# Patient Record
Sex: Male | Born: 1960
Health system: Southern US, Community
[De-identification: ages and names within clinical notes are randomized; demographics above are authoritative.]

## PROBLEM LIST (undated history)

## (undated) DIAGNOSIS — M1990 Unspecified osteoarthritis, unspecified site: Secondary | ICD-10-CM

## (undated) DIAGNOSIS — F17200 Nicotine dependence, unspecified, uncomplicated: Secondary | ICD-10-CM

## (undated) DIAGNOSIS — K219 Gastro-esophageal reflux disease without esophagitis: Secondary | ICD-10-CM

## (undated) DIAGNOSIS — E785 Hyperlipidemia, unspecified: Secondary | ICD-10-CM

## (undated) HISTORY — DX: Hyperlipidemia, unspecified: E78.5

## (undated) HISTORY — DX: Unspecified osteoarthritis, unspecified site: M19.90

## (undated) HISTORY — PX: BACK SURGERY: SHX140

## (undated) HISTORY — PX: MULTIPLE TOOTH EXTRACTIONS: SHX2053

---

## 2001-06-13 ENCOUNTER — Other Ambulatory Visit: Admission: RE | Admit: 2001-06-13 | Discharge: 2001-06-13 | Payer: Self-pay | Admitting: General Surgery

## 2002-11-14 ENCOUNTER — Encounter: Payer: Self-pay | Admitting: Family Medicine

## 2002-11-14 ENCOUNTER — Ambulatory Visit (HOSPITAL_COMMUNITY): Admission: RE | Admit: 2002-11-14 | Discharge: 2002-11-14 | Payer: Self-pay | Admitting: Family Medicine

## 2005-02-22 ENCOUNTER — Encounter: Admission: RE | Admit: 2005-02-22 | Discharge: 2005-02-22 | Payer: Self-pay | Admitting: Occupational Medicine

## 2008-11-30 ENCOUNTER — Emergency Department (HOSPITAL_COMMUNITY): Admission: EM | Admit: 2008-11-30 | Discharge: 2008-11-30 | Payer: Self-pay | Admitting: Emergency Medicine

## 2009-03-19 ENCOUNTER — Ambulatory Visit (HOSPITAL_COMMUNITY): Admission: RE | Admit: 2009-03-19 | Discharge: 2009-03-19 | Payer: Self-pay | Admitting: Internal Medicine

## 2009-06-19 ENCOUNTER — Ambulatory Visit (HOSPITAL_COMMUNITY): Admission: RE | Admit: 2009-06-19 | Discharge: 2009-06-19 | Payer: Self-pay | Admitting: Family Medicine

## 2009-07-02 ENCOUNTER — Ambulatory Visit (HOSPITAL_COMMUNITY): Admission: RE | Admit: 2009-07-02 | Discharge: 2009-07-02 | Payer: Self-pay | Admitting: Family Medicine

## 2011-01-12 ENCOUNTER — Emergency Department (HOSPITAL_COMMUNITY)
Admission: EM | Admit: 2011-01-12 | Discharge: 2011-01-12 | Disposition: A | Discharge status: Home / Self Care | Payer: 59 | Attending: Emergency Medicine | Admitting: Emergency Medicine

## 2011-01-12 ENCOUNTER — Emergency Department (HOSPITAL_COMMUNITY): Payer: 59

## 2011-01-12 ENCOUNTER — Encounter (HOSPITAL_COMMUNITY): Payer: Self-pay | Admitting: Radiology

## 2011-01-12 DIAGNOSIS — K5289 Other specified noninfective gastroenteritis and colitis: Secondary | ICD-10-CM | POA: Insufficient documentation

## 2011-01-12 DIAGNOSIS — R109 Unspecified abdominal pain: Secondary | ICD-10-CM | POA: Insufficient documentation

## 2011-01-12 LAB — HEPATIC FUNCTION PANEL
ALT: 19 U/L (ref 0–53)
AST: 18 U/L (ref 0–37)
Albumin: 4.1 g/dL (ref 3.5–5.2)
Bilirubin, Direct: 0.2 mg/dL (ref 0.0–0.3)
Total Bilirubin: 1 mg/dL (ref 0.3–1.2)

## 2011-01-12 LAB — BASIC METABOLIC PANEL
CO2: 24 mEq/L (ref 19–32)
Calcium: 8.8 mg/dL (ref 8.4–10.5)
GFR calc Af Amer: >60 mL/min (ref >60)
Glucose, Bld: 103 mg/dL — ABNORMAL HIGH (ref 70–99)
Potassium: 3.7 mEq/L (ref 3.5–5.1)
Sodium: 134 mEq/L — ABNORMAL LOW (ref 135–145)

## 2011-01-12 LAB — URINE MICROSCOPIC-ADD ON

## 2011-01-12 LAB — DIFFERENTIAL
Basophils Absolute: 0 10*3/uL (ref 0.0–0.1)
Basophils Relative: 0 % (ref 0–1)
Lymphocytes Relative: 11 % — ABNORMAL LOW (ref 12–46)
Neutro Abs: 9 10*3/uL — ABNORMAL HIGH (ref 1.7–7.7)
Neutrophils Relative %: 79 % — ABNORMAL HIGH (ref 43–77)

## 2011-01-12 LAB — URINALYSIS, ROUTINE W REFLEX MICROSCOPIC
Glucose, UA: NEGATIVE mg/dL
Hgb urine dipstick: NEGATIVE
Leukocytes, UA: NEGATIVE
pH: 5.5 (ref 5.0–8.0)

## 2011-01-12 LAB — CBC
HCT: 52.5 % — ABNORMAL HIGH (ref 39.0–52.0)
Hemoglobin: 18.1 g/dL — ABNORMAL HIGH (ref 13.0–17.0)
MCHC: 34.5 g/dL (ref 30.0–36.0)
RBC: 5.77 MIL/uL (ref 4.22–5.81)
WBC: 11.4 10*3/uL — ABNORMAL HIGH (ref 4.0–10.5)

## 2011-01-12 MED ORDER — IOHEXOL 300 MG/ML  SOLN
100.0000 mL | Freq: Once | INTRAMUSCULAR | Status: AC | PRN
  Administered 2011-01-12: 100 mL via INTRAVENOUS

## 2011-01-13 ENCOUNTER — Emergency Department (HOSPITAL_COMMUNITY): Payer: 59

## 2011-01-13 ENCOUNTER — Inpatient Hospital Stay (HOSPITAL_COMMUNITY)
Admission: AD | Admit: 2011-01-13 | Discharge: 2011-01-16 | DRG: 387 | Disposition: A | Discharge status: Home / Self Care | Payer: 59 | Source: Ambulatory Visit | Attending: Internal Medicine | Admitting: Internal Medicine

## 2011-01-13 DIAGNOSIS — M1990 Unspecified osteoarthritis, unspecified site: Secondary | ICD-10-CM | POA: Diagnosis present

## 2011-01-13 DIAGNOSIS — R7309 Other abnormal glucose: Secondary | ICD-10-CM | POA: Diagnosis present

## 2011-01-13 DIAGNOSIS — F1010 Alcohol abuse, uncomplicated: Secondary | ICD-10-CM | POA: Diagnosis present

## 2011-01-13 DIAGNOSIS — E876 Hypokalemia: Secondary | ICD-10-CM | POA: Diagnosis present

## 2011-01-13 DIAGNOSIS — R918 Other nonspecific abnormal finding of lung field: Secondary | ICD-10-CM | POA: Diagnosis present

## 2011-01-13 DIAGNOSIS — K5000 Crohn's disease of small intestine without complications: Principal | ICD-10-CM | POA: Diagnosis present

## 2011-01-13 DIAGNOSIS — F17200 Nicotine dependence, unspecified, uncomplicated: Secondary | ICD-10-CM | POA: Diagnosis present

## 2011-01-13 LAB — URINALYSIS, ROUTINE W REFLEX MICROSCOPIC
Nitrite: NEGATIVE
Protein, ur: NEGATIVE mg/dL
Urobilinogen, UA: 0.2 mg/dL (ref 0.0–1.0)

## 2011-01-13 LAB — COMPREHENSIVE METABOLIC PANEL
BUN: 5 mg/dL — ABNORMAL LOW (ref 6–23)
CO2: 26 mEq/L (ref 19–32)
Calcium: 8.4 mg/dL (ref 8.4–10.5)
Chloride: 102 mEq/L (ref 96–112)
Creatinine, Ser: 0.91 mg/dL (ref 0.4–1.5)
GFR calc non Af Amer: >60 mL/min (ref >60)
Total Bilirubin: 0.8 mg/dL (ref 0.3–1.2)

## 2011-01-13 LAB — LIPASE, BLOOD: Lipase: 21 U/L (ref 11–59)

## 2011-01-13 LAB — DIFFERENTIAL
Eosinophils Absolute: 0.2 10*3/uL (ref 0.0–0.7)
Lymphs Abs: 1.1 10*3/uL (ref 0.7–4.0)
Monocytes Absolute: 0.5 10*3/uL (ref 0.1–1.0)
Monocytes Relative: 6 % (ref 3–12)
Neutro Abs: 6.6 10*3/uL (ref 1.7–7.7)
Neutrophils Relative %: 79 % — ABNORMAL HIGH (ref 43–77)

## 2011-01-13 LAB — CBC
Hemoglobin: 16 g/dL (ref 13.0–17.0)
MCH: 31 pg (ref 26.0–34.0)
MCHC: 33.9 g/dL (ref 30.0–36.0)
MCV: 91.5 fL (ref 78.0–100.0)
RBC: 5.16 MIL/uL (ref 4.22–5.81)

## 2011-01-14 LAB — CLOSTRIDIUM DIFFICILE BY PCR: Toxigenic C. Difficile by PCR: NEGATIVE

## 2011-01-15 ENCOUNTER — Inpatient Hospital Stay (HOSPITAL_COMMUNITY): Payer: 59

## 2011-01-15 DIAGNOSIS — K5289 Other specified noninfective gastroenteritis and colitis: Secondary | ICD-10-CM

## 2011-01-15 LAB — DIFFERENTIAL
Basophils Relative: 1 % (ref 0–1)
Eosinophils Absolute: 0.3 10*3/uL (ref 0.0–0.7)
Eosinophils Relative: 5 % (ref 0–5)
Lymphs Abs: 0.9 10*3/uL (ref 0.7–4.0)

## 2011-01-15 LAB — CBC
MCV: 93 fL (ref 78.0–100.0)
Platelets: 152 10*3/uL (ref 150–400)
RDW: 13.1 % (ref 11.5–15.5)
WBC: 5.4 10*3/uL (ref 4.0–10.5)

## 2011-01-15 LAB — BASIC METABOLIC PANEL
BUN: 2 mg/dL — ABNORMAL LOW (ref 6–23)
Chloride: 109 mEq/L (ref 96–112)
GFR calc non Af Amer: >60 mL/min (ref >60)
Glucose, Bld: 140 mg/dL — ABNORMAL HIGH (ref 70–99)
Potassium: 4.6 mEq/L (ref 3.5–5.1)

## 2011-01-15 LAB — OVA AND PARASITE EXAMINATION

## 2011-01-15 NOTE — H&P (Signed)
NAME:  Matthew Rivera, Matthew Rivera NO.:  [id number]  MEDICAL RECORD NO.:  [id number]           PATIENT TYPE:  E  LOCATION:  [facility]ED                          FACILITY:  [facility]  PHYSICIAN:  Houston Siren, MD           DATE OF BIRTH:  1961-04-23  DATE OF ADMISSION:  01/13/2011 DATE OF DISCHARGE:  LH                             HISTORY & PHYSICAL   PRIMARY CARE PHYSICIAN:  Elsie Stain, MD  ADVANCE DIRECTIVES:  Full code.  REASON FOR ADMISSION:  Abdominal pain suspicious for Crohn disease.  HISTORY OF PRESENT ILLNESS:  This is a 50 year old male with history of alcohol and nicotine abuse, DJD, presented to the emergency room with 4- day history of abdominal cramps, pain, with 1 day history of diarrhea.  He denied any fever or chills.  He took some Pepto-Bismol and had some dark stool. He was evaluated in the emergency room yesterday when abdominal CT showed small bowel wall thickening suspicious for Crohn's disease with small bowel ileus.  He was given Cipro and Flagyl, but his pain increased, and he has not been able to take any p.o.  Reevaluation today showed a chest x-ray with question of small bowel obstruction versus ileus.  He has a normal lipase of 22, and normal liver function tests. His white count is 11,400, hemoglobin of 18.1.  His creatinine was normal at 1.03, and his urinalysis is negative.  Hospitalist was asked to admit the patient for abdominal pain.  PAST MEDICAL HISTORY:  As above.  PAST SURGICAL HISTORY:  Cyst removed from his spine.  SOCIAL HISTORY:  No drug abuse.  He does smoke and drinks about 3-4 drinks most days.  ALLERGIES:  No known drug allergies.  MEDICATIONS:  Cipro and Flagyl along with Levsin.  No family history of inflammatory bowel disease.  PHYSICAL EXAMINATION:  VITAL SIGNS:  Blood pressure 120/80, pulse of 90, respiratory rate of 20, temperature 97.8. GENERAL:  He is alert and oriented, and is in no apparent distress. HEENT:  Sclerae  are nonicteric.  Throat is clear. NECK:  Supple. CARDIAC:  S1 and S2 regular. LUNGS:  Clear. ABDOMEN:  Soft, nondistended, slightly tender in the mid-epigastrium. No palpable mass.  Bowel sounds present.  No ascites. EXTREMITIES:  Show no edema. SKIN:  Warm and dry.  No peripheral stigmata of liver disease. NEUROLOGICAL AND PSYCHIATRIC:  Unremarkable as well.  He has no tremor. No asterixis.  OBJECTIVE FINDINGS:  Urinalysis is negative.  Lipase 21.  Serum sodium 133, potassium 3.3, glucose 138, creatinine 0.91.  Liver function tests are all normal.  White count of 8400, hemoglobin of 16.0.  IMPRESSION:  This is a 50 year old with abdominal pain, diarrhea with CT scan showing inflammation of his small bowel.  I suspect that he has Crohn's disease.  Other differential includes infectious colitis, viral gastroenteritis, and C diff colitis.  We will admit.  I will get stool studies to include Giardia antigen, ova and parasites, culture and sensitivity, and C diff toxin.  He will require intravenous fluid and pain medication.  He will  try to quit smoking  without a nicotine patch.  We will give him alcohol withdrawal protocol with Ativan.  He will likely need a colonoscopy and an esophagogastroduodenoscopy.  Please consult Gastroenterology in the morning.  He is full code, will be admitted to [facility]  Team 1.  He is stable.  I will continue IV Cipro and Flagyl.     Houston Siren, MD     PL/MEDQ  D:  01/13/2011  T:  01/13/2011  Job:  [id number]  Electronically Signed by Houston Siren  on 01/15/2011 11:28:30 PM

## 2011-01-16 DIAGNOSIS — K5289 Other specified noninfective gastroenteritis and colitis: Secondary | ICD-10-CM

## 2011-01-16 LAB — BASIC METABOLIC PANEL
CO2: 28 mEq/L (ref 19–32)
Calcium: 8.2 mg/dL — ABNORMAL LOW (ref 8.4–10.5)
Creatinine, Ser: 0.84 mg/dL (ref 0.4–1.5)
GFR calc Af Amer: >60 mL/min (ref >60)
GFR calc non Af Amer: >60 mL/min (ref >60)
Glucose, Bld: 102 mg/dL — ABNORMAL HIGH (ref 70–99)
Sodium: 135 mEq/L (ref 135–145)

## 2011-01-16 LAB — CBC
Hemoglobin: 13.8 g/dL (ref 13.0–17.0)
MCH: 30.4 pg (ref 26.0–34.0)
MCV: 93 fL (ref 78.0–100.0)
RBC: 4.54 MIL/uL (ref 4.22–5.81)

## 2011-01-16 LAB — HEMOGLOBIN A1C: Mean Plasma Glucose: 100 mg/dL (ref <117)

## 2011-01-16 LAB — DIFFERENTIAL
Lymphs Abs: 1.2 10*3/uL (ref 0.7–4.0)
Monocytes Relative: 8 % (ref 3–12)
Neutro Abs: 2.5 10*3/uL (ref 1.7–7.7)
Neutrophils Relative %: 59 % (ref 43–77)

## 2011-01-18 LAB — STOOL CULTURE

## 2011-01-18 NOTE — Discharge Summary (Signed)
NAME:  Matthew Rivera, Matthew Rivera                  ACCOUNT NO.:  [id number]  MEDICAL RECORD NO.:  [id number]           PATIENT TYPE:  I  LOCATION:  A337                          FACILITY:  [facility]  PHYSICIAN:  Matthew Rivera, MDDATE OF BIRTH:  11-23-1960  DATE OF ADMISSION:  01/13/2011 DATE OF DISCHARGE:  04/07/2012LH                              DISCHARGE SUMMARY   PRIMARY CARE PHYSICIAN:  Matthew Rear. Sherwood Gambler, MD  DISCHARGE DIAGNOSES: 1. Abdominal pain secondary to small bowel enteritis. 2. Tobacco abuse. 3. Alcohol abuse 4. Hypokalemia. 5. Hyperglycemia without diagnosis of diabetes. 6. Degenerative joint disease. 7. Right lung opacity nonspecific without shortness of breath, chest     pain, or signs of pneumonia at this moment.  No other procedures     were performed during this hospitalization.  DISCHARGE MEDICATIONS: 1. Multivitamins 1 capsule by mouth daily. 2. Nicotine patch 21 mg every 24 hours transdermally, the patient     advised to stop smoking while wearing the patch. 3. Metronidazole 500 mg 1 tablet by mouth three times a day for 3     days. 4. Ciprofloxacin 500 mg 1 tablet by mouth twice a day for 3 days. 5. Levsin 0.125 mg 1-2 tablets by mouth every 4 hours as needed for     stomach pain.  DISPOSITION AND FOLLOWUP:  The patient has been discharged in a stable and improved condition, not complaining of any nausea, vomiting, or abdominal pain.  The patient also denies any diarrhea and is tolerating his food by mouth, full diet without any problems.  The patient is going to follow with primary care physician, Matthew Rivera, on Tuesday, January 19, 2011, at 9 a.m.  At that moment, it is going to be important to follow the patient's symptoms in order to verify complete resolution.  It is going to be important to arrange in about 3-6 months a colonoscopy as an outpatient clinic in order to screen him for colon cancer and also to make sure that the patient's enteritis that he had  during this hospitalization is not secondary to any inflammatory disease.  The patient will also need to have a hemoglobin A1c as an outpatient in order to continue following his episodes of hyperglycemia throughout this hospitalization without any history of diabetes.  X-ray of the chest in 6-8 weeks in order to follow nonspecific opacity of the right lung to be arranged by primary care physician.  PROCEDURE PERFORMED:  During this hospitalization, the patient had CT of the abdomen and pelvis that demonstrated moderate wall thickening involving distal small bowel loops included the terminal ileum suspicious for Crohn disease with a differential diagnosis also including infectious enteritis, vasculitis, hemorrhage, ischemia, and lymphoma.  Small bowel ileus, small amount of ascites without evidence of abscess.  The patient had an abdominal x-ray on January 13, 2011, that demonstrated mild gaseous distention of the midabdominal small bowel, contrast material in the colon suggest no obstruction.  The patient had acute abdominal series on January 15, 2011, that demonstrated interval appearance of vague nonspecific opacities at the right lung base with a small associated pleural effusion  suspicious for infection.  A followup of the chest x-ray is recommended in 6-8 weeks to ensure radiographic resolution.  Nonspecific nonobstructive bowel gas pattern was also visualized.  CONSULTATIONS:  GI was consulted during this hospitalization.  BRIEF HISTORY OF PRESENT ILLNESS:  Matthew Rivera is a 50 year old male with a history of alcohol and nicotine abuse, degenerative joint disease, who presented to the emergency room with a 4-day history of abdominal cramps and pain with 1-day history of diarrhea.  The patient denied any fever or chills.  The patient reports that he took Pepto-Bismol and had some dark stools.  The patient was evaluated in the emergency department the day prior to admission, had an  abdominal CT that showed small bowel wall thickening, suspicious for Crohn's with a small bowel ileus.  He was given Cipro and Flagyl to take by mouth and to follow with primary care physician, but the patient's pain increased and he has not been able to keep things down.  Reevaluation today show increase in the patient's small bowel inflammation with also an x-ray with a questionable small bowel instruction versus ileus.  PHYSICAL EXAMINATION:  VITAL SIGNS:  Blood pressure was 120/80, heart rate 90, respiratory rate 20, temperature 97.8. GENERAL:  The patient was alert, awake, and oriented in no distress. HEENT:  Nonicteric.  No ectasia.  Throat was clear.  No erythema.  No exudate.  Head atraumatic, normocephalic. NECK:  Supple.  No thyromegaly.  No lymphadenopathy. HEART:  Regular rate and rhythm. LUNGS:  Clear to auscultation. ABDOMEN:  Soft, nondistended, slightly tender in the midepigastric area but no palpable masses.  Bowel sounds are present. NEUROLOGIC:  Unremarkable. PSYCHIATRIC:  Unremarkable.  Pertinent laboratory data during this hospitalization revealed a CBC on admission of 8.4 with a hemoglobin of 16 and platelet of 175,000.  The patient had a lipase of 21.  C. diff PCR was negative.  Stool studies for ova and parasites were negative.  Giardia was negative.  HOSPITAL COURSE BY PROBLEM: 1. Abdominal pain secondary to small bowel enteritis.  After     discussing the case with Dr. Karilyn Cota, gastroenterologist, the plan     was to start the patient on IV antibiotics and to use Levsin p.r.n.     and to try to call off this infection.  The paren that Dr. Karilyn Cota     saw on the CT make Crohn's less likely into the differential, and     nonetheless he preferred to treat initially the infection and     follow with colonoscopy as an outpatient in 3-6 months.   2. Tobacco abuse.  The patient received counseling and also a     prescription for nicotine patch. 3. Alcohol abuse.   We are going to continue multivitamins to provide     thiamine and also folic acid.  The patient received counseling and     is planning to quit or at least cut down on his alcohol     consumption. 4. Hypokalemia secondary to poor intake and voiding contraction.     After fluid resuscitation and repletion, the patient's potassium     returned to normal range. 5. Hyperglycemia without diagnosis of diabetes.  Plan is for the     patient to follow with PCP in order to have A1c and continue     checking on his fasting blood sugar to rule out that he has no     diabetes. 6. X-ray finding of right lung opacity without any specific  details or     symptoms for pneumonia.  Plan is for the patient to have an x-ray     in 6-8 weeks in order to follow on these findings and confirm     complete three solution.  At discharge, the patient's temperature was 97.5, heart rate 61, respiratory rate 20, blood pressure 138/87, oxygen saturation 99%.  The patient was in no acute distress, alert, awake, and oriented x3, tolerating p.o.'s, not having any nausea, vomiting, or abdominal pain. He was feeling great.  Respiratory system was clear to auscultation bilaterally.  His abdomen is soft, nontender, nondistended with positive bowel sounds.  Heart regular rate and rhythm.  No edema on his extremities.  Neurological exam was nonfocal.  He had lab work demonstrating a potassium of 4.1, sodium 135, chloride 105, bicarb 28, BUN 3, creatinine 0.84, blood sugar 102.  White blood cells 4.3, hemoglobin 13.8, and his platelets were 153.     Matthew Randy, MD     CEM/MEDQ  D:  01/16/2011  T:  01/16/2011  Job:  [id number]  cc:   Matthew Rear. Sherwood Gambler, MD Fax: [phone]  Electronically Signed by Vassie Loll MD on 01/18/2011 04:04:11 PM

## 2011-02-05 NOTE — Consult Note (Signed)
NAMEGIANNIS, CORPUZ                  ACCOUNT NO.:  [id number]  MEDICAL RECORD NO.:  [id number]           PATIENT TYPE:  I  LOCATION:  A337                          FACILITY:  [facility]  PHYSICIAN:  Lionel December, M.D.    DATE OF BIRTH:  09-10-1961  DATE OF CONSULTATION: DATE OF DISCHARGE:                                CONSULTATION   REASON FOR CONSULTATION:  Acute ileitis.  HISTORY OF PRESENT ILLNESS:  Matthew Rivera is 50 year old Caucasian male patient of [name], who was in usual state of health until the evening of January 11, 2011, and he developed severe mid and lower abdominal cramping. He states when he woke up in the morning, he had no symptoms and ate his meals like before.  He was up all night with severe cramps and nonbloody diarrhea.  He did not experience any nausea, vomiting, fever, chills, breathing difficulty, or skin rash.  He says nobody else in his workplace or at home had experienced similar symptoms.  On January 12, 2011, he was still having severe abdominal cramps and now started to vomit and says it was mostly dry heaves because he had not eaten anything.  His pain continued.  He decided to come to emergency room on the evening of January 12, 2011.  He had acute abdominal series which revealed slightly distended the small bowel in midabdomen and his white cell count was elevated at 11.4 and his hemoglobin was 18.1.  He was advised to be hospitalized, but he decided to go home.  His symptoms persisted.  He therefore returned to emergency room on the evening of January 13, 2011.  He had abdominopelvic CT with contrast which showed mildly dilated loops of proximal small bowel with moderate wall thickening involving multiple loops of distal ileum and scant amount of free fluid in the abdomen and pelvis, but no pneumoperitoneum.  He also had a tiny low attenuation lesion in the inferior segment of right hepatic lobe, felt to be a cyst.  At this time, he agreed to be hospitalized.  He  was begun on IV fluids, analgesia, and begun on Cipro and Flagyl.  His stool studies are pending, except C. diff toxin titer is negative.  He is still having diarrhea.  He has noted some black specks, but no frank melena or rectal bleeding.  He continues to have cramps across his mid and lower abdomen.  He says these come in waves.  He has been sipping on liquids which he has been able to keep down.  Matthew Rivera did not travel outside the country recently.  He does not recall that he is taken any antibiotics.  At home, he is on Naprosyn, but he uses it on p.r.n. basis and had not taken it at least 2 months.  He recalls that he had abdominal pain in July or August last year when he was seen by [name].  Appointment was made for him to be seen by me.  His symptoms went away and he decided not to pursue it any further.  He has not lost any weight recently.  When he is  feeling well, his bowels move every other day.  His weight has been stable.  HOME MEDICATIONS: 1. Naprosyn 500 mg p.o. b.i.d. p.r.n. which he does not take very     often. 2. MVI daily.  CURRENT MEDICATIONS: 1. He is on D5 normal saline at 100 mL an hour. 2. Folic acid 1 mg p.o. daily. 3. MVI one p.o. daily. 4. Nicotine patch 21 mg to skin daily. 5. Thiamine 100 mg p.o. daily. 6. Dilaudid 0.5-1 mg IV q.4 p.r.n. 7. Lorazepam 1 mg IV q.6 p.r.n. 8. Zofran 4 mg IV q.6 p.r.n. 9. Phenergan 12.5 mg IV or p.o. q.6 p.r.n. 10.Zolpidem 10 mg p.o. bedtime p.r.n.  PAST MEDICAL HISTORY:  He has chronic low back pain secondary to three bulging disks.  He had pilonidal cyst excision 10 or 15 years ago by Dr. Malvin Johns.  ALLERGIES:  NK.  FAMILY HISTORY:  Father has had CABG, but doing very well and so is his mother, they are both in their early 50s.  He has a brother in good health.  SOCIAL HISTORY:  He is married.  He has one biologic son and three stepchildren.  He is working at [employer] for the last 3-1/2 years there.   He drives a forklift.  Prior to that, he worked with [employer] for 2 years in a [employer] for 16 years.  He smokes half to one pack of cigarettes per day which he has done for 20 years and he drinks beer anywhere from 0-4 cans per day.  He states he does not drink more than 3- 4 days each week.  He states over 25 years ago, he was drinking too much alcohol, but he is able to quit, but slowly went back to drinking.  OBJECTIVE:  VITAL SIGNS:  Weight is 70.8 kg, he is 71 inches tall, pulse 70 per minute, blood pressure 120/82, respiratory rate 19, and temp is 97.9. HEENT:  Conjunctivae is pink.  Sclerae nonicteric.  Oropharyngeal mucosa is normal.  Several teeth are missing, remaining in satisfactory condition.  No neck masses or thyromegaly noted. CARDIAC:  Regular rhythm.  Normal S1 and S2.  No murmur or gallop noted. LUNGS:  Clear to auscultation. ABDOMEN:  Full.  Bowel sounds are hyperactive.  On palpation, soft abdomen with generalized tenderness, but more so in epigastric region with some guarding, but no rebound noted.  No organomegaly or masses. RECTAL:  Deferred. EXTREMITIES:  No peripheral edema or clubbing noted.  LABORATORY DATA:  From this admission WBC 8.4, H and H 16 and 47.2, platelet count 175,000, segs 79%, serum sodium 133, potassium 3.3, chloride 102, CO2 of 26, glucose 138, BUN 5, creatinine 0.9, bilirubin 0.8, AP 39, SGOT 17, SGPT 16, total protein 6.7, with albumin of 3.6, calcium 8.4, serum lipase 21.  Urinalysis within normal limits.  Cultures and O and P is pending.  Abdominopelvic CT reviewed.  There are mucosal changes to several loops of this ileum involving the terminal ileum.  No abnormality noted to the colon.  There is 1-cm angio myelolipoma involving lower pole of right kidney.  Scant amount of ascites.  I have reviewed the CT with the radiologist and SMA and celiac trunk appear to be patent.  ASSESSMENT:  Matthew Rivera is 50 year old Caucasian male who presents  with acute illness consisting of severe abdominal cramps, nausea, vomiting, and nonbloody diarrhea.  Abdominopelvic CT showed changes of ileitis involving the distal several loops, mid and proximal small bowel appears to be normal,  although somewhat dilated.  No air fluid level is noted.  I suspect he has either an acute infection or toxic antritis.  He did have a brief illness about 8 months ago which raises a question whether this is Crohn disease presenting with a flare-up, but however, he has had no symptoms between these episodes.  Therefore, I am not convinced that this is Crohn disease.  I do not see any skipped lesions.  Another concern would be ischemic injury to his ileum, but his SMA and celiac trunk are patent.  He could still have disease to his small blood vessels.  He appears to be gradually improving with therapy.  RECOMMENDATIONS: 1. We will start him on Cipro 400 mg IV q12h and metronidazole 500 mg IV q6. 2. We will request CBC with diff and BMET and acute abdominal series     today. 3. If his symptoms linger on, we would consider a colonoscopy with     ileoscopy.  Further recommendations to follow.  We appreciate the opportunity to participate in the care of this gentleman.   Dictation ended at this point.Lionel December, M.D.     NR/MEDQ  D:  01/15/2011  T:  01/16/2011  Job:  [id number]  Electronically Signed by Lionel December M.D. on 02/05/2011 11:28:11 AM

## 2012-03-05 IMAGING — CR DG ABDOMEN 2V
2 series · 2 of 2 positions shown · non-contrast
Comparison: Plain films and CT 01/12/2011

CLINICAL DATA: Abdominal pain, nausea vomiting and diarrhea for 4
days.

ABDOMEN - 2 VIEW

[view not recorded (1 of 2)]
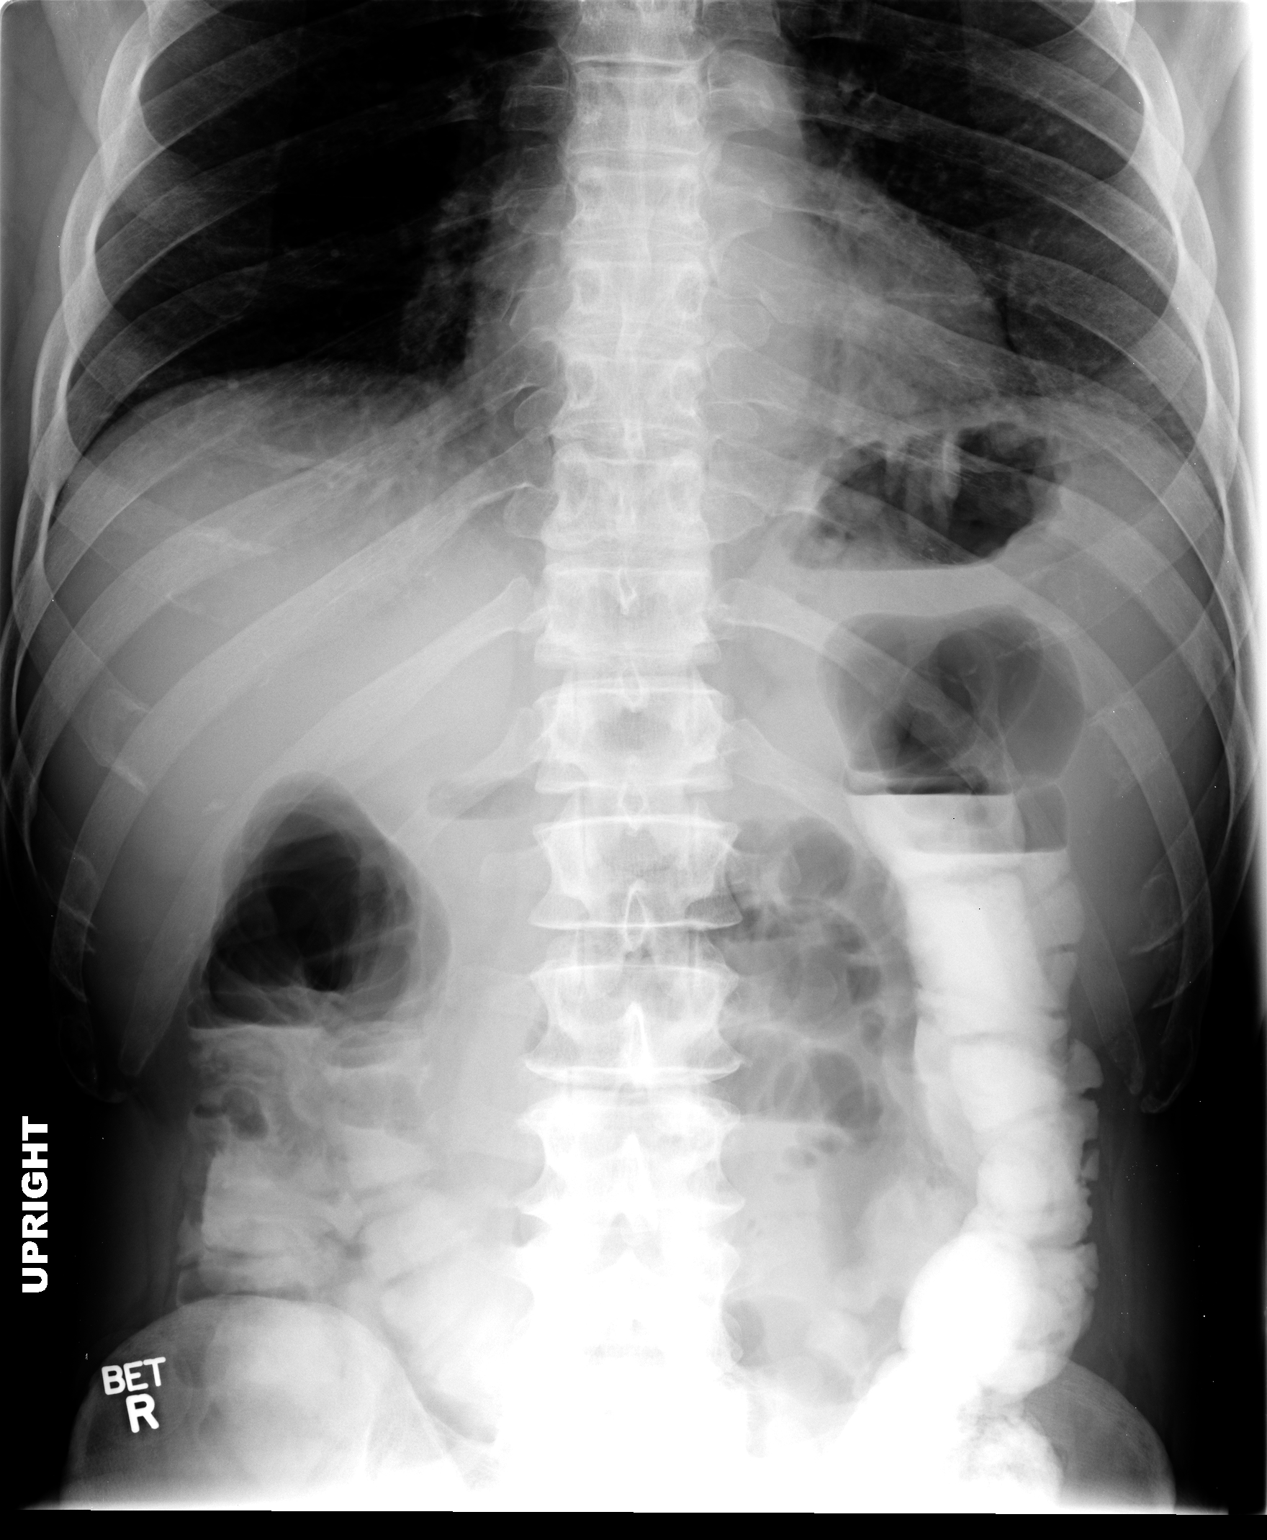

[view not recorded (2 of 2)]
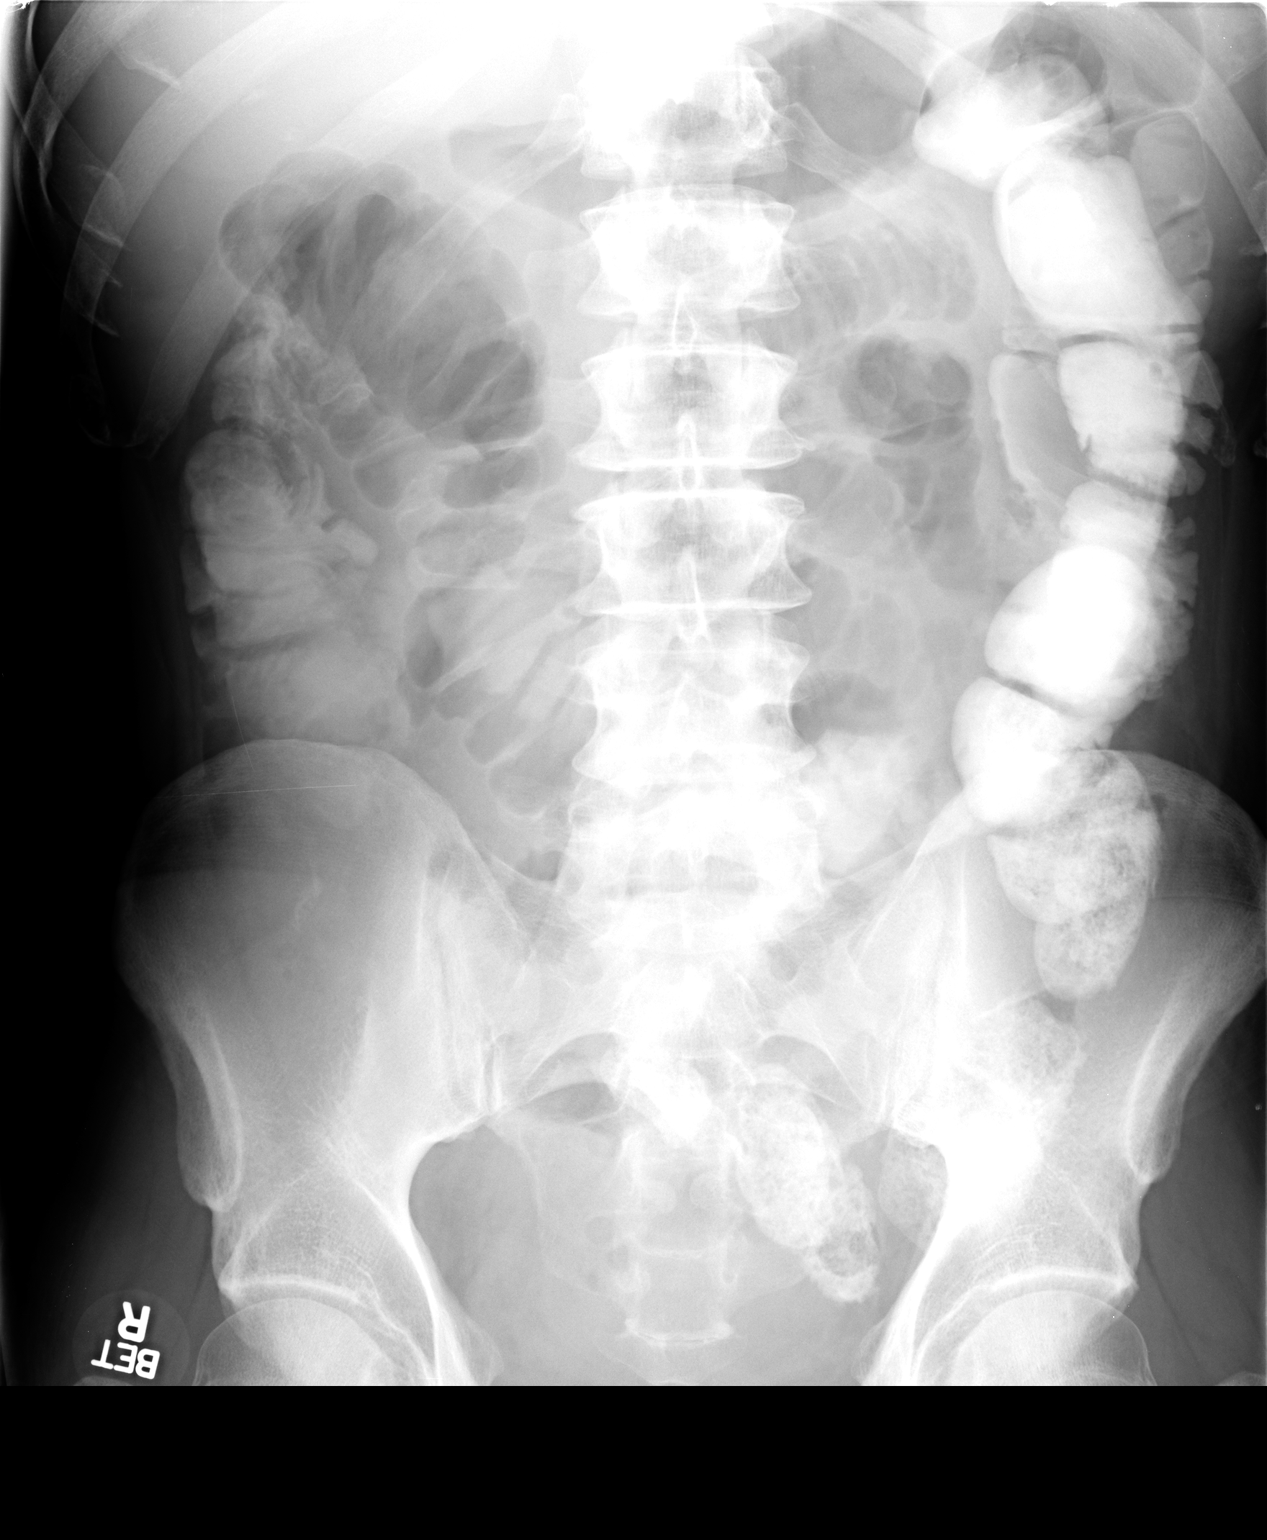

[2 of 2 positions shown; findings below may reference images not displayed]

FINDINGS: Contrast material demonstrated in nondistended colon
likely from the previous CT scan.  Gas filled mildly distended
small bowel loops in the upper abdomen.  No free air.  Changes are
similar to those seen on the prior plain film.  The extensive small
bowel wall thickening noted on the CT is not demonstrated on plain
film.
IMPRESSION: Mild gaseous distension of the mid abdominal small bowel.  Contrast
material in the colon suggests no obstruction.

## 2012-03-07 IMAGING — CR DG ABDOMEN ACUTE W/ 1V CHEST
3 series · 3 of 3 positions shown · non-contrast
Comparison: 01/13/2011

CLINICAL DATA: Abdominal pain

ACUTE ABDOMEN SERIES (ABDOMEN 2 VIEW & CHEST 1 VIEW)

[view not recorded (1 of 3)]
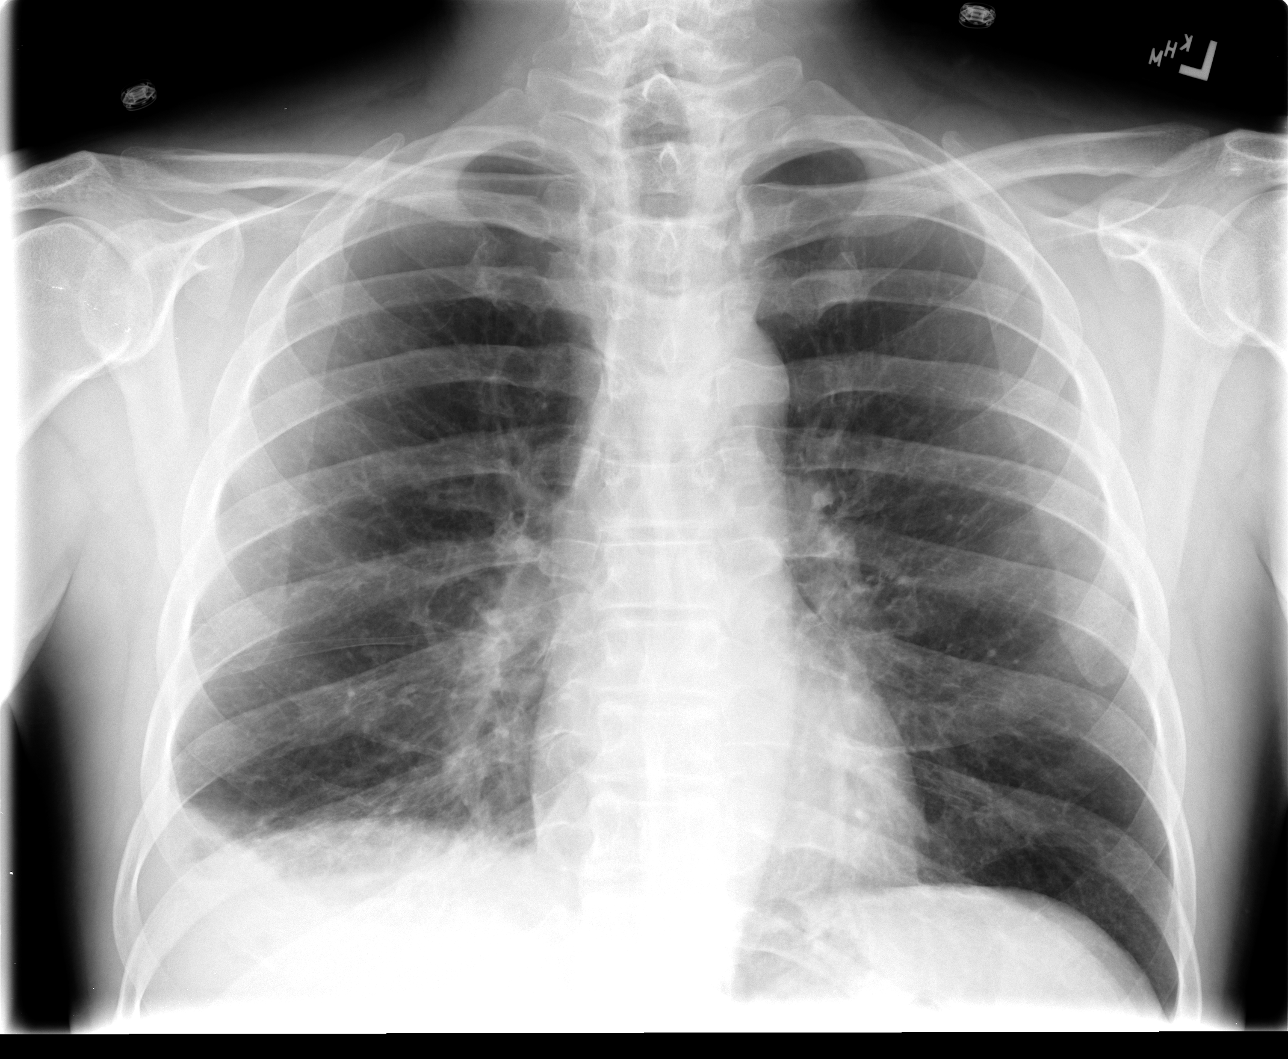

[view not recorded (2 of 3)]
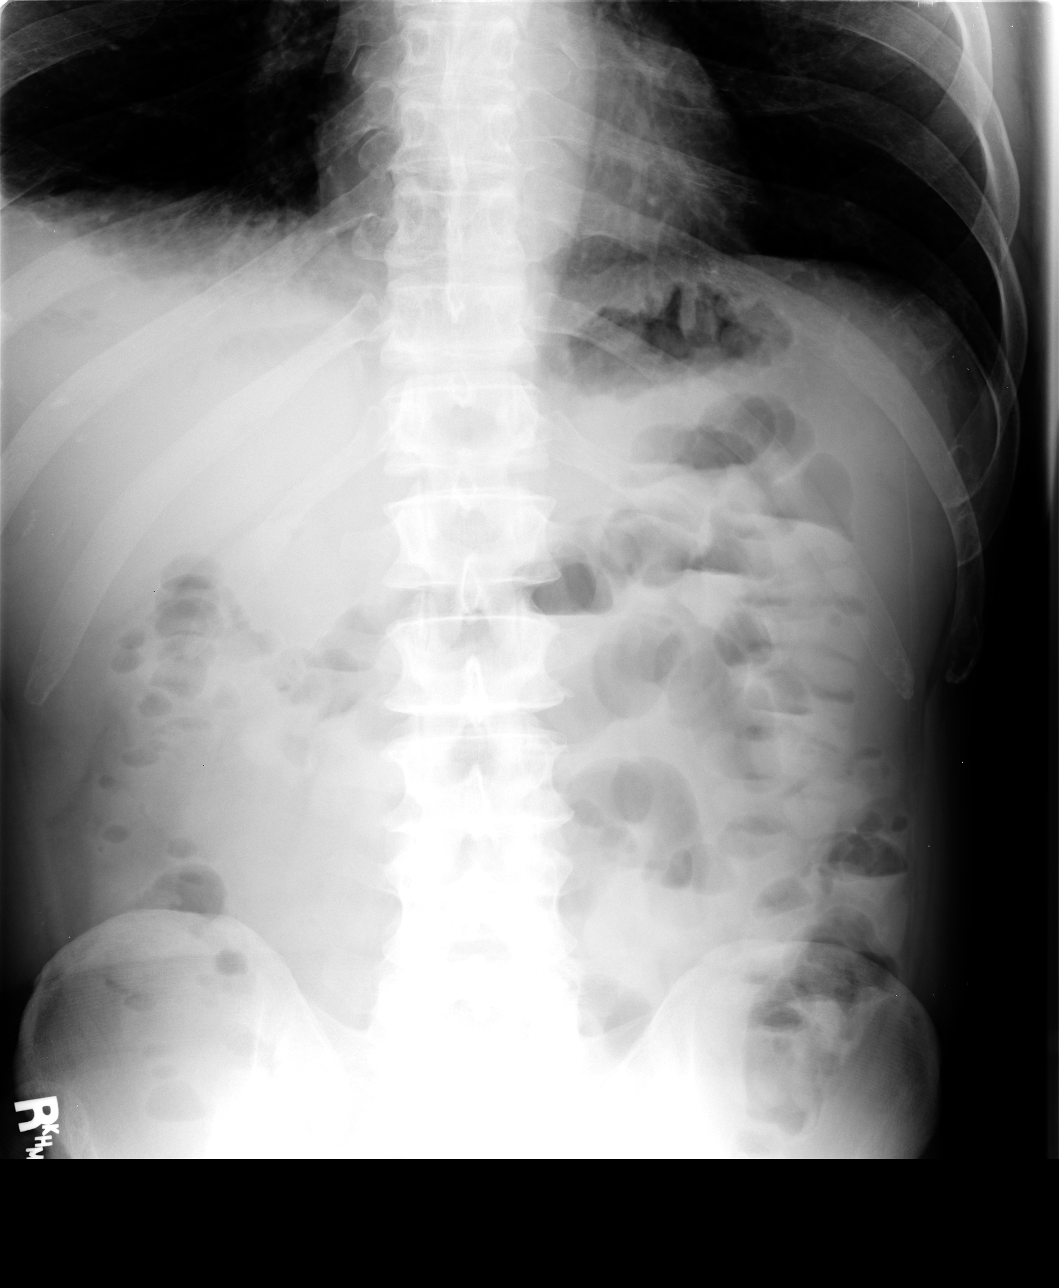

[view not recorded (3 of 3)]
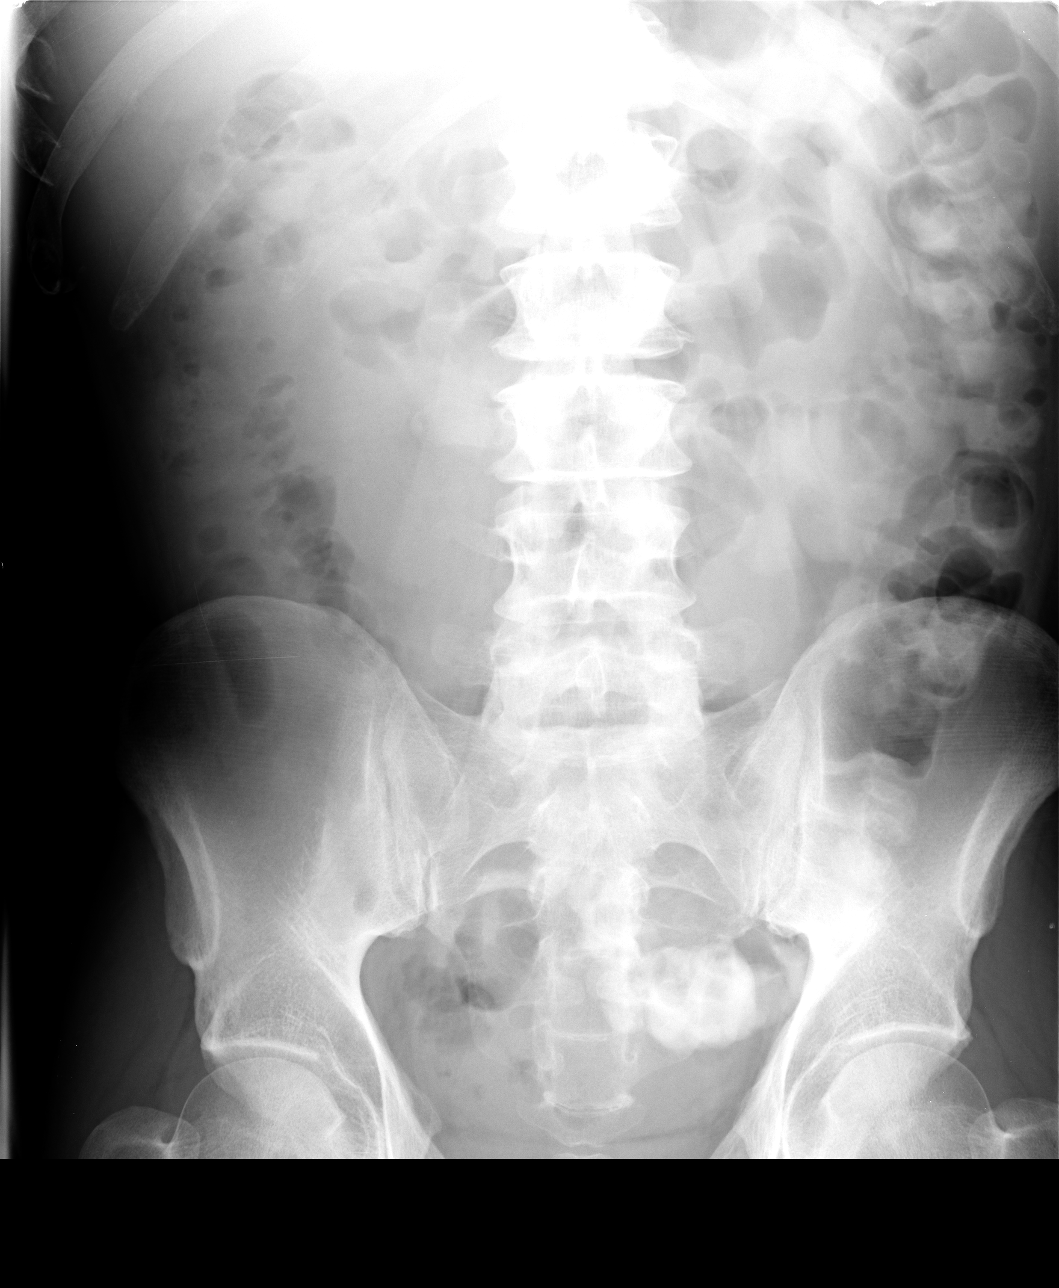

[3 of 3 positions shown; findings below may reference images not displayed]

FINDINGS: Interval appearance of vague opacity at the right lung
base and a small associated pleural effusion.  The left lung is
clear.  The heart is normal in size.  Gas filled nondilated small
bowel loops are seen in the abdomen.  Interval passage of contrast
material into the left colon.  There is no intra-abdominal free
air.  No unexpected calcifications are seen.  The bones are normal.
IMPRESSION: Interval appearance of vague nonspecific opacity at the right lung
base with small associated pleural effusion suspicious for
infection.  A follow up chest xray is recommended in 6-8 weeks to
ensure radiographic resolution.
Nonspecific, nonobstructive bowel gas pattern.

## 2016-12-16 DIAGNOSIS — G894 Chronic pain syndrome: Secondary | ICD-10-CM | POA: Diagnosis not present

## 2016-12-16 DIAGNOSIS — Z6820 Body mass index (BMI) 20.0-20.9, adult: Secondary | ICD-10-CM | POA: Diagnosis not present

## 2016-12-16 DIAGNOSIS — M1991 Primary osteoarthritis, unspecified site: Secondary | ICD-10-CM | POA: Diagnosis not present

## 2017-03-10 DIAGNOSIS — M1991 Primary osteoarthritis, unspecified site: Secondary | ICD-10-CM | POA: Diagnosis not present

## 2017-03-10 DIAGNOSIS — M2550 Pain in unspecified joint: Secondary | ICD-10-CM | POA: Diagnosis not present

## 2017-03-10 DIAGNOSIS — G894 Chronic pain syndrome: Secondary | ICD-10-CM | POA: Diagnosis not present

## 2017-05-09 DIAGNOSIS — K219 Gastro-esophageal reflux disease without esophagitis: Secondary | ICD-10-CM | POA: Diagnosis not present

## 2017-05-09 DIAGNOSIS — G894 Chronic pain syndrome: Secondary | ICD-10-CM | POA: Diagnosis not present

## 2017-05-09 DIAGNOSIS — M2550 Pain in unspecified joint: Secondary | ICD-10-CM | POA: Diagnosis not present

## 2017-06-07 DIAGNOSIS — G894 Chronic pain syndrome: Secondary | ICD-10-CM | POA: Diagnosis not present

## 2017-06-07 DIAGNOSIS — Z1389 Encounter for screening for other disorder: Secondary | ICD-10-CM | POA: Diagnosis not present

## 2017-06-07 DIAGNOSIS — Z0000 Encounter for general adult medical examination without abnormal findings: Secondary | ICD-10-CM | POA: Diagnosis not present

## 2017-11-03 DIAGNOSIS — G894 Chronic pain syndrome: Secondary | ICD-10-CM | POA: Diagnosis not present

## 2017-11-03 DIAGNOSIS — K219 Gastro-esophageal reflux disease without esophagitis: Secondary | ICD-10-CM | POA: Diagnosis not present

## 2017-11-03 DIAGNOSIS — Z719 Counseling, unspecified: Secondary | ICD-10-CM | POA: Diagnosis not present

## 2018-03-01 DIAGNOSIS — K219 Gastro-esophageal reflux disease without esophagitis: Secondary | ICD-10-CM | POA: Diagnosis not present

## 2018-03-01 DIAGNOSIS — G894 Chronic pain syndrome: Secondary | ICD-10-CM | POA: Diagnosis not present

## 2018-03-01 DIAGNOSIS — M1991 Primary osteoarthritis, unspecified site: Secondary | ICD-10-CM | POA: Diagnosis not present

## 2018-06-01 DIAGNOSIS — M1991 Primary osteoarthritis, unspecified site: Secondary | ICD-10-CM | POA: Diagnosis not present

## 2018-06-01 DIAGNOSIS — G894 Chronic pain syndrome: Secondary | ICD-10-CM | POA: Diagnosis not present

## 2018-06-01 DIAGNOSIS — K219 Gastro-esophageal reflux disease without esophagitis: Secondary | ICD-10-CM | POA: Diagnosis not present

## 2018-08-18 DIAGNOSIS — Z0000 Encounter for general adult medical examination without abnormal findings: Secondary | ICD-10-CM | POA: Diagnosis not present

## 2018-08-18 DIAGNOSIS — Z23 Encounter for immunization: Secondary | ICD-10-CM | POA: Diagnosis not present

## 2018-08-18 DIAGNOSIS — Z1389 Encounter for screening for other disorder: Secondary | ICD-10-CM | POA: Diagnosis not present

## 2018-08-18 DIAGNOSIS — Z6821 Body mass index (BMI) 21.0-21.9, adult: Secondary | ICD-10-CM | POA: Diagnosis not present

## 2018-11-08 DIAGNOSIS — G894 Chronic pain syndrome: Secondary | ICD-10-CM | POA: Diagnosis not present

## 2018-11-08 DIAGNOSIS — M1991 Primary osteoarthritis, unspecified site: Secondary | ICD-10-CM | POA: Diagnosis not present

## 2018-11-08 DIAGNOSIS — K219 Gastro-esophageal reflux disease without esophagitis: Secondary | ICD-10-CM | POA: Diagnosis not present

## 2018-12-19 DIAGNOSIS — K219 Gastro-esophageal reflux disease without esophagitis: Secondary | ICD-10-CM | POA: Diagnosis not present

## 2018-12-19 DIAGNOSIS — G894 Chronic pain syndrome: Secondary | ICD-10-CM | POA: Diagnosis not present

## 2018-12-19 DIAGNOSIS — Z6821 Body mass index (BMI) 21.0-21.9, adult: Secondary | ICD-10-CM | POA: Diagnosis not present

## 2019-01-29 DIAGNOSIS — G894 Chronic pain syndrome: Secondary | ICD-10-CM | POA: Diagnosis not present

## 2019-01-29 DIAGNOSIS — M1991 Primary osteoarthritis, unspecified site: Secondary | ICD-10-CM | POA: Diagnosis not present

## 2019-01-29 DIAGNOSIS — Z6821 Body mass index (BMI) 21.0-21.9, adult: Secondary | ICD-10-CM | POA: Diagnosis not present

## 2020-06-23 ENCOUNTER — Encounter: Payer: Self-pay | Admitting: Internal Medicine

## 2020-07-24 ENCOUNTER — Telehealth: Payer: Self-pay | Admitting: *Deleted

## 2020-07-24 ENCOUNTER — Encounter: Payer: Self-pay | Admitting: *Deleted

## 2020-07-24 ENCOUNTER — Ambulatory Visit: Payer: 59 | Admitting: Internal Medicine

## 2020-07-24 ENCOUNTER — Other Ambulatory Visit: Payer: Self-pay

## 2020-07-24 ENCOUNTER — Encounter: Payer: Self-pay | Admitting: Internal Medicine

## 2020-07-24 DIAGNOSIS — R195 Other fecal abnormalities: Secondary | ICD-10-CM | POA: Insufficient documentation

## 2020-07-24 NOTE — Patient Instructions (Signed)
We will schedule you for colonoscopy to evaluate your positive Cologuard testing.  Further recommendations to follow.  At [facility] we value your feedback. You may receive a survey about your visit today. Please share your experience as we strive to create trusting relationships with our patients to provide genuine, compassionate, quality care.  We appreciate your understanding and patience as we review any laboratory studies, imaging, and other diagnostic tests that are ordered as we care for you. Our office policy is 5 business days for review of these results, and any emergent or urgent results are addressed in a timely manner for your best interest. If you do not hear from our office in 1 week, please contact us.   We also encourage the use of MyChart, which contains your medical information for your review as well. If you are not enrolled in this feature, an access code is on this after visit summary for your convenience. Thank you for allowing [place] to be involved in your care.  It was great to see you today!  I hope you have a great rest of your fall!!   Syre Knerr K. Abbey Chatters, D.O. Gastroenterology and Hepatology [facility]

## 2020-07-24 NOTE — Progress Notes (Signed)
Primary Care Physician:  Sharilyn Sites, MD Primary Gastroenterologist:  Dr. Abbey Chatters  Chief Complaint  Patient presents with  . + cologuard    no TCS done prior    HPI:   Matthew Rivera is a 59 y.o. male who presents to the clinic today by referral from his PCP Dr. Hilma Favors for evaluation.  He recently had a yearly exam.  For colon cancer screening he underwent Cologuard testing which was positive.  Patient denies any melena hematochezia.  No family history of colorectal emergency.  No unintentional weight loss.  Denies any reflux or heartburn.  No dysphagia odynophagia.  No abdominal pain.  No change in bowel habits.  Does take chronic NSAIDs including naproxen for osteoarthritis.  Past Medical History:  Diagnosis Date  . Dyslipidemia   . Osteoarthritis     Past Surgical History:  Procedure Laterality Date  . BACK SURGERY      Current Outpatient Medications  Medication Sig Dispense Refill  . atorvastatin (LIPITOR) 20 MG tablet Take 20 mg by mouth daily.    Marland Kitchen HYDROcodone-acetaminophen (NORCO) 10-325 MG tablet Take 1 tablet by mouth every 4 (four) hours as needed.    . naproxen (NAPROSYN) 500 MG tablet Take 500 mg by mouth 2 (two) times daily as needed.    . traMADol (ULTRAM) 50 MG tablet Take 50-100 mg by mouth 4 (four) times daily as needed.     No current facility-administered medications for this visit.    Allergies as of 07/24/2020  . (No Known Allergies)    Family History  Problem Relation Age of Onset  . Diabetes Father   . Asthma Father     Social History   Socioeconomic History  . Marital status: Married    Spouse name: Not on file  . Number of children: Not on file  . Years of education: Not on file  . Highest education level: Not on file  Occupational History  . Not on file  Tobacco Use  . Smoking status: Current Every Day Smoker    Types: Cigarettes  . Smokeless tobacco: Never Used  Substance and Sexual Activity  . Alcohol use: Yes    Comment:  occas  . Drug use: Never  . Sexual activity: Not on file  Other Topics Concern  . Not on file  Social History Narrative  . Not on file   Social Determinants of Health   Financial Resource Strain:   . Difficulty of Paying Living Expenses: Not on file  Food Insecurity:   . Worried About Charity fundraiser in the Last Year: Not on file  . Ran Out of Food in the Last Year: Not on file  Transportation Needs:   . Lack of Transportation (Medical): Not on file  . Lack of Transportation (Non-Medical): Not on file  Physical Activity:   . Days of Exercise per Week: Not on file  . Minutes of Exercise per Session: Not on file  Stress:   . Feeling of Stress : Not on file  Social Connections:   . Frequency of Communication with Friends and Family: Not on file  . Frequency of Social Gatherings with Friends and Family: Not on file  . Attends Religious Services: Not on file  . Active Member of Clubs or Organizations: Not on file  . Attends Archivist Meetings: Not on file  . Marital Status: Not on file  Intimate Partner Violence:   . Fear of Current or Ex-Partner: Not on file  .  Emotionally Abused: Not on file  . Physically Abused: Not on file  . Sexually Abused: Not on file    Subjective: Review of Systems  Constitutional: Negative for chills and fever.  HENT: Negative for congestion and hearing loss.   Eyes: Negative for blurred vision and double vision.  Respiratory: Negative for cough and shortness of breath.   Cardiovascular: Negative for chest pain and palpitations.  Gastrointestinal: Negative for abdominal pain, blood in stool, constipation, diarrhea, heartburn, melena and vomiting.  Genitourinary: Negative for dysuria and urgency.  Musculoskeletal: Negative for joint pain and myalgias.  Skin: Negative for itching and rash.  Neurological: Negative for dizziness and headaches.  Psychiatric/Behavioral: Negative for depression. The patient is not nervous/anxious.         Objective: BP (!) 145/87   Pulse 88   Temp (!) 97.5 F (36.4 C) (Oral)   Ht 6' (1.829 m)   Wt 148 lb 3.2 oz (67.2 kg)   BMI 20.10 kg/m  Physical Exam Constitutional:      Appearance: Normal appearance.  HENT:     Head: Normocephalic and atraumatic.  Eyes:     Extraocular Movements: Extraocular movements intact.     Conjunctiva/sclera: Conjunctivae normal.  Cardiovascular:     Rate and Rhythm: Normal rate and regular rhythm.  Pulmonary:     Effort: Pulmonary effort is normal.     Breath sounds: Normal breath sounds.  Abdominal:     General: Bowel sounds are normal.     Palpations: Abdomen is soft.  Musculoskeletal:        General: Normal range of motion.     Cervical back: Normal range of motion and neck supple.  Skin:    General: Skin is warm.  Neurological:     General: No focal deficit present.     Mental Status: He is alert and oriented to person, place, and time.  Psychiatric:        Mood and Affect: Mood normal.        Behavior: Behavior normal.      Assessment: *Positive Cologuard testing  Plan: Will schedule for diagnostic colonoscopy.The risks including infection, bleed, or perforation as well as benefits, limitations, alternatives and imponderables have been reviewed with the patient. Questions have been answered. All parties agreeable.  Thank you Dr. Hilma Favors for the kind referral.  07/24/2020 11:30 AM   Disclaimer: This note was dictated with voice recognition software. Similar sounding words can inadvertently be transcribed and may not be corrected upon review.

## 2020-07-24 NOTE — H&P (View-Only) (Signed)
Primary Care Physician:  Sharilyn Sites, MD Primary Gastroenterologist:  Dr. Abbey Chatters  Chief Complaint  Patient presents with  . + cologuard    no TCS done prior    HPI:   Matthew Rivera is a 59 y.o. male who presents to the clinic today by referral from his PCP Dr. Hilma Favors for evaluation.  He recently had a yearly exam.  For colon cancer screening he underwent Cologuard testing which was positive.  Patient denies any melena hematochezia.  No family history of colorectal emergency.  No unintentional weight loss.  Denies any reflux or heartburn.  No dysphagia odynophagia.  No abdominal pain.  No change in bowel habits.  Does take chronic NSAIDs including naproxen for osteoarthritis.  Past Medical History:  Diagnosis Date  . Dyslipidemia   . Osteoarthritis     Past Surgical History:  Procedure Laterality Date  . BACK SURGERY      Current Outpatient Medications  Medication Sig Dispense Refill  . atorvastatin (LIPITOR) 20 MG tablet Take 20 mg by mouth daily.    Marland Kitchen HYDROcodone-acetaminophen (NORCO) 10-325 MG tablet Take 1 tablet by mouth every 4 (four) hours as needed.    . naproxen (NAPROSYN) 500 MG tablet Take 500 mg by mouth 2 (two) times daily as needed.    . traMADol (ULTRAM) 50 MG tablet Take 50-100 mg by mouth 4 (four) times daily as needed.     No current facility-administered medications for this visit.    Allergies as of 07/24/2020  . (No Known Allergies)    Family History  Problem Relation Age of Onset  . Diabetes Father   . Asthma Father     Social History   Socioeconomic History  . Marital status: Married    Spouse name: Not on file  . Number of children: Not on file  . Years of education: Not on file  . Highest education level: Not on file  Occupational History  . Not on file  Tobacco Use  . Smoking status: Current Every Day Smoker    Types: Cigarettes  . Smokeless tobacco: Never Used  Substance and Sexual Activity  . Alcohol use: Yes    Comment:  occas  . Drug use: Never  . Sexual activity: Not on file  Other Topics Concern  . Not on file  Social History Narrative  . Not on file   Social Determinants of Health   Financial Resource Strain:   . Difficulty of Paying Living Expenses: Not on file  Food Insecurity:   . Worried About Charity fundraiser in the Last Year: Not on file  . Ran Out of Food in the Last Year: Not on file  Transportation Needs:   . Lack of Transportation (Medical): Not on file  . Lack of Transportation (Non-Medical): Not on file  Physical Activity:   . Days of Exercise per Week: Not on file  . Minutes of Exercise per Session: Not on file  Stress:   . Feeling of Stress : Not on file  Social Connections:   . Frequency of Communication with Friends and Family: Not on file  . Frequency of Social Gatherings with Friends and Family: Not on file  . Attends Religious Services: Not on file  . Active Member of Clubs or Organizations: Not on file  . Attends Archivist Meetings: Not on file  . Marital Status: Not on file  Intimate Partner Violence:   . Fear of Current or Ex-Partner: Not on file  .  Emotionally Abused: Not on file  . Physically Abused: Not on file  . Sexually Abused: Not on file    Subjective: Review of Systems  Constitutional: Negative for chills and fever.  HENT: Negative for congestion and hearing loss.   Eyes: Negative for blurred vision and double vision.  Respiratory: Negative for cough and shortness of breath.   Cardiovascular: Negative for chest pain and palpitations.  Gastrointestinal: Negative for abdominal pain, blood in stool, constipation, diarrhea, heartburn, melena and vomiting.  Genitourinary: Negative for dysuria and urgency.  Musculoskeletal: Negative for joint pain and myalgias.  Skin: Negative for itching and rash.  Neurological: Negative for dizziness and headaches.  Psychiatric/Behavioral: Negative for depression. The patient is not nervous/anxious.         Objective: BP (!) 145/87   Pulse 88   Temp (!) 97.5 F (36.4 C) (Oral)   Ht 6' (1.829 m)   Wt 148 lb 3.2 oz (67.2 kg)   BMI 20.10 kg/m  Physical Exam Constitutional:      Appearance: Normal appearance.  HENT:     Head: Normocephalic and atraumatic.  Eyes:     Extraocular Movements: Extraocular movements intact.     Conjunctiva/sclera: Conjunctivae normal.  Cardiovascular:     Rate and Rhythm: Normal rate and regular rhythm.  Pulmonary:     Effort: Pulmonary effort is normal.     Breath sounds: Normal breath sounds.  Abdominal:     General: Bowel sounds are normal.     Palpations: Abdomen is soft.  Musculoskeletal:        General: Normal range of motion.     Cervical back: Normal range of motion and neck supple.  Skin:    General: Skin is warm.  Neurological:     General: No focal deficit present.     Mental Status: He is alert and oriented to person, place, and time.  Psychiatric:        Mood and Affect: Mood normal.        Behavior: Behavior normal.      Assessment: *Positive Cologuard testing  Plan: Will schedule for diagnostic colonoscopy.The risks including infection, bleed, or perforation as well as benefits, limitations, alternatives and imponderables have been reviewed with the patient. Questions have been answered. All parties agreeable.  Thank you Dr. Hilma Favors for the kind referral.  07/24/2020 11:30 AM   Disclaimer: This note was dictated with voice recognition software. Similar sounding words can inadvertently be transcribed and may not be corrected upon review.

## 2020-07-24 NOTE — Telephone Encounter (Signed)
PA approved via [facility]. Auth# [id number] dates 08/18/2020-11/16/2020

## 2020-08-15 ENCOUNTER — Other Ambulatory Visit: Payer: Self-pay

## 2020-08-15 ENCOUNTER — Other Ambulatory Visit (HOSPITAL_COMMUNITY)
Admission: RE | Admit: 2020-08-15 | Discharge: 2020-08-15 | Disposition: A | Discharge status: Home / Self Care | Payer: 59 | Source: Ambulatory Visit | Attending: Internal Medicine | Admitting: Internal Medicine

## 2020-08-15 DIAGNOSIS — Z20822 Contact with and (suspected) exposure to covid-19: Secondary | ICD-10-CM | POA: Diagnosis not present

## 2020-08-15 DIAGNOSIS — Z01812 Encounter for preprocedural laboratory examination: Secondary | ICD-10-CM | POA: Insufficient documentation

## 2020-08-15 LAB — SARS CORONAVIRUS 2 (TAT 6-24 HRS): SARS Coronavirus 2: NEGATIVE

## 2020-08-18 ENCOUNTER — Ambulatory Visit (HOSPITAL_COMMUNITY): Payer: 59 | Admitting: Anesthesiology

## 2020-08-18 ENCOUNTER — Encounter (HOSPITAL_COMMUNITY)
Admission: RE | Disposition: A | Discharge status: Home / Self Care | Payer: Self-pay | Source: Home / Self Care | Attending: Internal Medicine

## 2020-08-18 ENCOUNTER — Ambulatory Visit (HOSPITAL_COMMUNITY)
Admission: RE | Admit: 2020-08-18 | Discharge: 2020-08-18 | Disposition: A | Discharge status: Home / Self Care | Payer: 59 | Attending: Internal Medicine | Admitting: Internal Medicine

## 2020-08-18 ENCOUNTER — Encounter (HOSPITAL_COMMUNITY): Payer: Self-pay

## 2020-08-18 ENCOUNTER — Other Ambulatory Visit: Payer: Self-pay

## 2020-08-18 DIAGNOSIS — Z791 Long term (current) use of non-steroidal anti-inflammatories (NSAID): Secondary | ICD-10-CM | POA: Insufficient documentation

## 2020-08-18 DIAGNOSIS — F17210 Nicotine dependence, cigarettes, uncomplicated: Secondary | ICD-10-CM | POA: Insufficient documentation

## 2020-08-18 DIAGNOSIS — K648 Other hemorrhoids: Secondary | ICD-10-CM | POA: Diagnosis not present

## 2020-08-18 DIAGNOSIS — K635 Polyp of colon: Secondary | ICD-10-CM

## 2020-08-18 DIAGNOSIS — D125 Benign neoplasm of sigmoid colon: Secondary | ICD-10-CM | POA: Diagnosis not present

## 2020-08-18 DIAGNOSIS — R195 Other fecal abnormalities: Secondary | ICD-10-CM | POA: Diagnosis present

## 2020-08-18 DIAGNOSIS — D123 Benign neoplasm of transverse colon: Secondary | ICD-10-CM | POA: Diagnosis not present

## 2020-08-18 DIAGNOSIS — Z79899 Other long term (current) drug therapy: Secondary | ICD-10-CM | POA: Diagnosis not present

## 2020-08-18 HISTORY — PX: POLYPECTOMY: SHX149

## 2020-08-18 HISTORY — PX: COLONOSCOPY WITH PROPOFOL: SHX5780

## 2020-08-18 SURGERY — COLONOSCOPY WITH PROPOFOL
Anesthesia: General

## 2020-08-18 MED ORDER — STERILE WATER FOR IRRIGATION IR SOLN
Status: DC | PRN
  Administered 2020-08-18: 100 mL

## 2020-08-18 MED ORDER — PROPOFOL 10 MG/ML IV BOLUS
INTRAVENOUS | Status: DC | PRN
  Administered 2020-08-18: 20 mg via INTRAVENOUS
  Administered 2020-08-18: 10 mg via INTRAVENOUS
  Administered 2020-08-18: 100 mg via INTRAVENOUS

## 2020-08-18 MED ORDER — PROPOFOL 500 MG/50ML IV EMUL
INTRAVENOUS | Status: DC | PRN
  Administered 2020-08-18: 150 ug/kg/min via INTRAVENOUS

## 2020-08-18 MED ORDER — CHLORHEXIDINE GLUCONATE CLOTH 2 % EX PADS: 6.0000 | MEDICATED_PAD | Freq: Once | CUTANEOUS | Status: DC

## 2020-08-18 MED ORDER — LACTATED RINGERS IV SOLN: Freq: Once | INTRAVENOUS | Status: AC

## 2020-08-18 MED ORDER — LACTATED RINGERS IV SOLN: INTRAVENOUS | Status: DC | PRN

## 2020-08-18 NOTE — Anesthesia Preprocedure Evaluation (Addendum)
Anesthesia Evaluation  Patient identified by MRN, date of birth, ID band Patient awake    Reviewed: Allergy & Precautions, NPO status , Patient's Chart, lab work & pertinent test results  History of Anesthesia Complications Negative for: history of anesthetic complications  Airway Mallampati: II  TM Distance: >3 FB Neck ROM: Full    Dental  (+) Dental Advisory Given, Upper Dentures, Lower Dentures   Pulmonary Current SmokerPatient did not abstain from smoking.,    Pulmonary exam normal breath sounds clear to auscultation       Cardiovascular Exercise Tolerance: Good Normal cardiovascular exam Rhythm:Regular Rate:Normal     Neuro/Psych negative neurological ROS  negative psych ROS   GI/Hepatic negative GI ROS, (+)     substance abuse  alcohol use,   Endo/Other  negative endocrine ROS  Renal/GU negative Renal ROS     Musculoskeletal  (+) Arthritis  (back sx),   Abdominal   Peds  Hematology negative hematology ROS (+)   Anesthesia Other Findings   Reproductive/Obstetrics negative OB ROS                            Anesthesia Physical Anesthesia Plan  ASA: II  Anesthesia Plan: General   Post-op Pain Management:    Induction: Intravenous  PONV Risk Score and Plan: TIVA  Airway Management Planned: Nasal Cannula and Natural Airway  Additional Equipment:   Intra-op Plan:   Post-operative Plan:   Informed Consent: I have reviewed the patients History and Physical, chart, labs and discussed the procedure including the risks, benefits and alternatives for the proposed anesthesia with the patient or authorized representative who has indicated his/her understanding and acceptance.     Dental advisory given  Plan Discussed with: CRNA and Surgeon  Anesthesia Plan Comments:         Anesthesia Quick Evaluation

## 2020-08-18 NOTE — Op Note (Addendum)
[facility] Patient Name: Matthew Rivera Procedure Date: 08/18/2020 7:17 AM MRN: [id number] Date of Birth: 03-07-1961 Attending MD: Elon Alas. Abbey Chatters DO CSN: [id number] Age: 59 Admit Type: Outpatient Procedure:                Colonoscopy Indications:              Positive Cologuard test Providers:                Elon Alas. Abbey Chatters, DO, Crystal Page, Randa Spike, Technician Referring MD:              Medicines:                See the Anesthesia note for documentation of the                            administered medications Complications:            No immediate complications. Estimated Blood Loss:     Estimated blood loss was minimal. Procedure:                Pre-Anesthesia Assessment:                           - The anesthesia plan was to use monitored                            anesthesia care (MAC).                           After obtaining informed consent, the colonoscope                            was passed under direct vision. Throughout the                            procedure, the patient's blood pressure, pulse, and                            oxygen saturations were monitored continuously. The                            PCF-[id number] ([id number]) was introduced through the                            anus and advanced to the the terminal ileum, with                            identification of the appendiceal orifice and IC                            valve. The colonoscopy was performed without                            difficulty. The patient tolerated the procedure  well. The quality of the bowel preparation was                            evaluated using the BBPS [facility] Bowel Preparation                            Scale) with scores of: Right Colon = 3, Transverse                            Colon = 3 and Left Colon = 3 (entire mucosa seen                            well with no residual staining, small fragments of                             stool or opaque liquid). The total BBPS score                            equals 9. Scope In: 7:37:28 AM Scope Out: 7:56:04 AM Scope Withdrawal Time: 0 hours 13 minutes 50 seconds  Total Procedure Duration: 0 hours 18 minutes 36 seconds  Findings:      The perianal and digital rectal examinations were normal.      Non-bleeding internal hemorrhoids were found during endoscopy.      Two sessile polyps were found in the transverse colon. The polyps were 5       to 8 mm in size. These polyps were removed with a cold snare. Resection       and retrieval were complete.      A 10 mm polyp was found in the sigmoid colon. The polyp was       pedunculated. The polyp was removed with a hot snare. Resection and       retrieval were complete. Impression:               - Non-bleeding internal hemorrhoids.                           - Two 5 to 8 mm polyps in the transverse colon,                            removed with a cold snare. Resected and retrieved.                           - One 10 mm polyp in the sigmoid colon, removed                            with a hot snare. Resected and retrieved. Moderate Sedation:      Per Anesthesia Care Recommendation:           - Patient has a contact number available for                            emergencies. The signs and symptoms of potential  delayed complications were discussed with the                            patient. Return to normal activities tomorrow.                            Written discharge instructions were provided to the                            patient.                           - Resume previous diet.                           - Continue present medications.                           - Await pathology results.                           - Repeat colonoscopy in 3 years for surveillance.                           - Return to GI clinic PRN. Procedure Code(s):        --- Professional ---                            [id number], Colonoscopy, flexible; with removal of                            tumor(s), polyp(s), or other lesion(s) by snare                            technique Diagnosis Code(s):        --- Professional ---                           K63.5, Polyp of colon                           K64.8, Other hemorrhoids                           R19.5, Other fecal abnormalities CPT copyright 2019 American Medical Association. All rights reserved. The codes documented in this report are preliminary and upon coder review may  be revised to meet current compliance requirements. Elon Alas. Abbey Chatters, DO [place] Abbey Chatters, DO 08/18/2020 8:13:42 AM This report has been signed electronically. Number of Addenda: 0

## 2020-08-18 NOTE — Transfer of Care (Signed)
Immediate Anesthesia Transfer of Care Note  Patient: Matthew Rivera  Procedure(s) Performed: COLONOSCOPY WITH PROPOFOL (N/A ) POLYPECTOMY INTESTINAL  Patient Location: PACU  Anesthesia Type:General  Level of Consciousness: awake  Airway & Oxygen Therapy: Patient Spontanous Breathing  Post-op Assessment: Report given to RN  Post vital signs: Reviewed and stable  Last Vitals:  Vitals Value Taken Time  BP 100/79 08/18/20 0800  Temp 36.4 C 08/18/20 0800  Pulse    Resp 15 08/18/20 0800  SpO2 99 % 08/18/20 0800    Last Pain:  Vitals:   08/18/20 0800  TempSrc: Oral  PainSc: 0-No pain      Patients Stated Pain Goal: 4 (28/40/69 8614)  Complications: No complications documented.

## 2020-08-18 NOTE — Anesthesia Postprocedure Evaluation (Signed)
Anesthesia Post Note  Patient: Matthew Rivera  Procedure(s) Performed: COLONOSCOPY WITH PROPOFOL (N/A ) POLYPECTOMY INTESTINAL  Patient location during evaluation: Endoscopy Anesthesia Type: General Level of consciousness: awake and alert and oriented Pain management: pain level controlled Vital Signs Assessment: post-procedure vital signs reviewed and stable Respiratory status: spontaneous breathing Cardiovascular status: blood pressure returned to baseline and stable Postop Assessment: no apparent nausea or vomiting Anesthetic complications: no   No complications documented.   Last Vitals:  Vitals:   08/18/20 0658 08/18/20 0800  BP: 132/85 100/79  Pulse: 79   Resp: 12 15  Temp: 36.6 C 36.4 C  SpO2: 100% 99%    Last Pain:  Vitals:   08/18/20 0800  TempSrc: Oral  PainSc: 0-No pain                 Vennesa Bastedo

## 2020-08-18 NOTE — Interval H&P Note (Signed)
History and Physical Interval Note:  08/18/2020 7:26 AM  Matthew Rivera  has presented today for surgery, with the diagnosis of positive cologuard.  The various methods of treatment have been discussed with the patient and family. After consideration of risks, benefits and other options for treatment, the patient has consented to  Procedure(s) with comments: COLONOSCOPY WITH PROPOFOL (N/A) - 7:30am as a surgical intervention.  The patient's history has been reviewed, patient examined, no change in status, stable for surgery.  I have reviewed the patient's chart and labs.  Questions were answered to the patient's satisfaction.     Eloise Harman

## 2020-08-18 NOTE — Discharge Instructions (Addendum)
Colonoscopy Discharge Instructions  Read the instructions outlined below and refer to this sheet in the next few weeks. These discharge instructions provide you with general information on caring for yourself after you leave the hospital. Your doctor may also give you specific instructions. While your treatment has been planned according to the most current medical practices available, unavoidable complications occasionally occur.   ACTIVITY  You may resume your regular activity, but move at a slower pace for the next 24 hours.   Take frequent rest periods for the next 24 hours.   Walking will help get rid of the air and reduce the bloated feeling in your belly (abdomen).   No driving for 24 hours (because of the medicine (anesthesia) used during the test).    Do not sign any important legal documents or operate any machinery for 24 hours (because of the anesthesia used during the test).  NUTRITION  Drink plenty of fluids.   You may resume your normal diet as instructed by your doctor.   Begin with a light meal and progress to your normal diet. Heavy or fried foods are harder to digest and may make you feel sick to your stomach (nauseated).   Avoid alcoholic beverages for 24 hours or as instructed.  MEDICATIONS  You may resume your normal medications unless your doctor tells you otherwise.  WHAT YOU CAN EXPECT TODAY  Some feelings of bloating in the abdomen.   Passage of more gas than usual.   Spotting of blood in your stool or on the toilet paper.  IF YOU HAD POLYPS REMOVED DURING THE COLONOSCOPY:  No aspirin products for 7 days or as instructed.   No alcohol for 7 days or as instructed.   Eat a soft diet for the next 24 hours.  FINDING OUT THE RESULTS OF YOUR TEST Not all test results are available during your visit. If your test results are not back during the visit, make an appointment with your caregiver to find out the results. Do not assume everything is normal if  you have not heard from your caregiver or the medical facility. It is important for you to follow up on all of your test results.  SEEK IMMEDIATE MEDICAL ATTENTION IF:  You have more than a spotting of blood in your stool.   Your belly is swollen (abdominal distention).   You are nauseated or vomiting.   You have a temperature over 101.   You have abdominal pain or discomfort that is severe or gets worse throughout the day.   Your colonoscopy revealed 3 polyp(s) which I removed successfully. Await pathology results, my office will contact you. I recommend repeating colonoscopy in 3 years for surveillance purposes.  Follow-up with GI as needed.   I hope you have a great rest of your week!  Elon Alas. Abbey Chatters, D.O. Gastroenterology and Hepatology [facility]   Colon Polyps  Polyps are tissue growths inside the body. Polyps can grow in many places, including the large intestine (colon). A polyp may be a round bump or a mushroom-shaped growth. You could have one polyp or several. Most colon polyps are noncancerous (benign). However, some colon polyps can become cancerous over time. Finding and removing the polyps early can help prevent this. What are the causes? The exact cause of colon polyps is not known. What increases the risk? You are more likely to develop this condition if you:  Have a family history of colon cancer or colon polyps.  Are older  than 77 or older than 45 if you are African American.  Have inflammatory bowel disease, such as ulcerative colitis or Crohn's disease.  Have certain hereditary conditions, such as: ? Familial adenomatous polyposis. ? Lynch syndrome. ? Turcot syndrome. ? Peutz-Jeghers syndrome.  Are overweight.  Smoke cigarettes.  Do not get enough exercise.  Drink too much alcohol.  Eat a diet that is high in fat and red meat and low in fiber.  Had childhood cancer that was treated with abdominal  radiation. What are the signs or symptoms? Most polyps do not cause symptoms. If you have symptoms, they may include:  Blood coming from your rectum when having a bowel movement.  Blood in your stool. The stool may look dark red or black.  Abdominal pain.  A change in bowel habits, such as constipation or diarrhea. How is this diagnosed? This condition is diagnosed with a colonoscopy. This is a procedure in which a lighted, flexible scope is inserted into the anus and then passed into the colon to examine the area. Polyps are sometimes found when a colonoscopy is done as part of routine cancer screening tests. How is this treated? Treatment for this condition involves removing any polyps that are found. Most polyps can be removed during a colonoscopy. Those polyps will then be tested for cancer. Additional treatment may be needed depending on the results of testing. Follow these instructions at home: Lifestyle  Maintain a healthy weight, or lose weight if recommended by your health care provider.  Exercise every day or as told by your health care provider.  Do not use any products that contain nicotine or tobacco, such as cigarettes and e-cigarettes. If you need help quitting, ask your health care provider.  If you drink alcohol, limit how much you have: ? 0-1 drink a day for women. ? 0-2 drinks a day for men.  Be aware of how much alcohol is in your drink. In the [place] one drink equals one 12 oz bottle of beer (355 mL), one 5 oz glass of wine (148 mL), or one 1 oz shot of hard liquor (44 mL). Eating and drinking   Eat foods that are high in fiber, such as fruits, vegetables, and whole grains.  Eat foods that are high in calcium and vitamin D, such as milk, cheese, yogurt, eggs, liver, fish, and broccoli.  Limit foods that are high in fat, such as fried foods and desserts.  Limit the amount of red meat and processed meat you eat, such as hot dogs, sausage, bacon, and lunch  meats. General instructions  Keep all follow-up visits as told by your health care provider. This is important. ? This includes having regularly scheduled colonoscopies. ? Talk to your health care provider about when you need a colonoscopy. Contact a health care provider if:  You have new or worsening bleeding during a bowel movement.  You have new or increased blood in your stool.  You have a change in bowel habits.  You lose weight for no known reason. Summary  Polyps are tissue growths inside the body. Polyps can grow in many places, including the colon.  Most colon polyps are noncancerous (benign), but some can become cancerous over time.  This condition is diagnosed with a colonoscopy.  Treatment for this condition involves removing any polyps that are found. Most polyps can be removed during a colonoscopy. This information is not intended to replace advice given to you by your health care provider. Make sure you discuss  any questions you have with your health care provider. Document Revised: 01/12/2018 Document Reviewed: 01/12/2018 Elsevier Patient Education  [employer].

## 2020-08-19 LAB — SURGICAL PATHOLOGY

## 2020-08-22 ENCOUNTER — Encounter (HOSPITAL_COMMUNITY): Payer: Self-pay | Admitting: Internal Medicine

## 2021-02-11 ENCOUNTER — Other Ambulatory Visit: Payer: Self-pay

## 2021-02-11 ENCOUNTER — Other Ambulatory Visit: Payer: Self-pay | Admitting: Family Medicine

## 2021-02-11 ENCOUNTER — Ambulatory Visit: Payer: Self-pay

## 2021-02-11 DIAGNOSIS — M25571 Pain in right ankle and joints of right foot: Secondary | ICD-10-CM

## 2023-02-16 ENCOUNTER — Other Ambulatory Visit (HOSPITAL_COMMUNITY): Payer: Self-pay | Admitting: Internal Medicine

## 2023-02-16 DIAGNOSIS — R0989 Other specified symptoms and signs involving the circulatory and respiratory systems: Secondary | ICD-10-CM

## 2023-02-16 DIAGNOSIS — R109 Unspecified abdominal pain: Secondary | ICD-10-CM

## 2023-03-31 ENCOUNTER — Other Ambulatory Visit: Payer: Self-pay

## 2023-03-31 ENCOUNTER — Encounter (HOSPITAL_COMMUNITY): Payer: Self-pay

## 2023-03-31 ENCOUNTER — Emergency Department (HOSPITAL_COMMUNITY): Payer: 59

## 2023-03-31 ENCOUNTER — Observation Stay (HOSPITAL_COMMUNITY)
Admission: EM | Admit: 2023-03-31 | Discharge: 2023-04-01 | Disposition: A | Discharge status: Home / Self Care | Payer: 59 | Attending: Family Medicine | Admitting: Family Medicine

## 2023-03-31 DIAGNOSIS — K219 Gastro-esophageal reflux disease without esophagitis: Secondary | ICD-10-CM | POA: Diagnosis not present

## 2023-03-31 DIAGNOSIS — Z7982 Long term (current) use of aspirin: Secondary | ICD-10-CM | POA: Insufficient documentation

## 2023-03-31 DIAGNOSIS — C3490 Malignant neoplasm of unspecified part of unspecified bronchus or lung: Secondary | ICD-10-CM | POA: Insufficient documentation

## 2023-03-31 DIAGNOSIS — R110 Nausea: Secondary | ICD-10-CM | POA: Diagnosis present

## 2023-03-31 DIAGNOSIS — E785 Hyperlipidemia, unspecified: Secondary | ICD-10-CM | POA: Diagnosis present

## 2023-03-31 DIAGNOSIS — R739 Hyperglycemia, unspecified: Secondary | ICD-10-CM | POA: Insufficient documentation

## 2023-03-31 DIAGNOSIS — Z79899 Other long term (current) drug therapy: Secondary | ICD-10-CM | POA: Insufficient documentation

## 2023-03-31 DIAGNOSIS — R519 Headache, unspecified: Principal | ICD-10-CM | POA: Diagnosis present

## 2023-03-31 DIAGNOSIS — C7931 Secondary malignant neoplasm of brain: Secondary | ICD-10-CM | POA: Diagnosis not present

## 2023-03-31 DIAGNOSIS — R911 Solitary pulmonary nodule: Secondary | ICD-10-CM

## 2023-03-31 DIAGNOSIS — R42 Dizziness and giddiness: Secondary | ICD-10-CM | POA: Insufficient documentation

## 2023-03-31 DIAGNOSIS — F17210 Nicotine dependence, cigarettes, uncomplicated: Secondary | ICD-10-CM | POA: Diagnosis not present

## 2023-03-31 DIAGNOSIS — Z720 Tobacco use: Secondary | ICD-10-CM | POA: Insufficient documentation

## 2023-03-31 DIAGNOSIS — G9389 Other specified disorders of brain: Secondary | ICD-10-CM | POA: Diagnosis not present

## 2023-03-31 HISTORY — DX: Solitary pulmonary nodule: R91.1

## 2023-03-31 LAB — CBC WITH DIFFERENTIAL/PLATELET
Abs Immature Granulocytes: 0.03 10*3/uL (ref 0.00–0.07)
Basophils Absolute: 0 10*3/uL (ref 0.0–0.1)
Basophils Relative: 1 %
Eosinophils Absolute: 0.2 10*3/uL (ref 0.0–0.5)
Eosinophils Relative: 3 %
HCT: 50.6 % (ref 39.0–52.0)
Hemoglobin: 16.5 g/dL (ref 13.0–17.0)
Immature Granulocytes: 0 %
Lymphocytes Relative: 10 %
Lymphs Abs: 0.7 10*3/uL (ref 0.7–4.0)
MCH: 29.3 pg (ref 26.0–34.0)
MCHC: 32.6 g/dL (ref 30.0–36.0)
MCV: 89.7 fL (ref 80.0–100.0)
Monocytes Absolute: 0.5 10*3/uL (ref 0.1–1.0)
Monocytes Relative: 7 %
Neutro Abs: 5.4 10*3/uL (ref 1.7–7.7)
Neutrophils Relative %: 79 %
Platelets: 228 10*3/uL (ref 150–400)
RBC: 5.64 MIL/uL (ref 4.22–5.81)
RDW: 13.5 % (ref 11.5–15.5)
WBC: 6.9 10*3/uL (ref 4.0–10.5)
nRBC: 0 % (ref 0.0–0.2)

## 2023-03-31 LAB — SEDIMENTATION RATE: Sed Rate: 13 mm/hr (ref 0–16)

## 2023-03-31 LAB — PHOSPHORUS: Phosphorus: 3.3 mg/dL (ref 2.5–4.6)

## 2023-03-31 LAB — BASIC METABOLIC PANEL
Anion gap: 11 (ref 5–15)
BUN: 18 mg/dL (ref 8–23)
CO2: 24 mmol/L (ref 22–32)
Calcium: 9.5 mg/dL (ref 8.9–10.3)
Chloride: 100 mmol/L (ref 98–111)
Creatinine, Ser: 0.87 mg/dL (ref 0.61–1.24)
GFR, Estimated: >60 mL/min (ref >60)
Glucose, Bld: 107 mg/dL — ABNORMAL HIGH (ref 70–99)
Potassium: 4 mmol/L (ref 3.5–5.1)
Sodium: 135 mmol/L (ref 135–145)

## 2023-03-31 LAB — MAGNESIUM: Magnesium: 2 mg/dL (ref 1.7–2.4)

## 2023-03-31 LAB — HIV ANTIBODY (ROUTINE TESTING W REFLEX): HIV Screen 4th Generation wRfx: NONREACTIVE

## 2023-03-31 MED ORDER — PANTOPRAZOLE SODIUM 40 MG PO TBEC
40.0000 mg | DELAYED_RELEASE_TABLET | Freq: Two times a day (BID) | ORAL | Status: DC
  Administered 2023-03-31 – 2023-04-01 (×3): 40 mg via ORAL
  Filled 2023-03-31 (×3): qty 1

## 2023-03-31 MED ORDER — HEPARIN SODIUM (PORCINE) 5000 UNIT/ML IJ SOLN
5000.0000 [IU] | Freq: Three times a day (TID) | INTRAMUSCULAR | Status: DC
  Administered 2023-03-31 – 2023-04-01 (×4): 5000 [IU] via SUBCUTANEOUS
  Filled 2023-03-31 (×4): qty 1

## 2023-03-31 MED ORDER — GADOBUTROL 1 MMOL/ML IV SOLN
7.0000 mL | Freq: Once | INTRAVENOUS | Status: AC | PRN
  Administered 2023-03-31: 7 mL via INTRAVENOUS

## 2023-03-31 MED ORDER — ACETAMINOPHEN 650 MG RE SUPP: 650.0000 mg | Freq: Four times a day (QID) | RECTAL | Status: DC | PRN

## 2023-03-31 MED ORDER — ATORVASTATIN CALCIUM 10 MG PO TABS
20.0000 mg | ORAL_TABLET | Freq: Every day | ORAL | Status: DC
  Administered 2023-03-31 – 2023-04-01 (×2): 20 mg via ORAL
  Filled 2023-03-31 (×2): qty 2

## 2023-03-31 MED ORDER — TRAMADOL HCL 50 MG PO TABS
50.0000 mg | ORAL_TABLET | Freq: Three times a day (TID) | ORAL | Status: DC | PRN
  Administered 2023-03-31 – 2023-04-01 (×3): 50 mg via ORAL
  Filled 2023-03-31 (×3): qty 1

## 2023-03-31 MED ORDER — FENTANYL CITRATE PF 50 MCG/ML IJ SOSY
50.0000 ug | PREFILLED_SYRINGE | Freq: Once | INTRAMUSCULAR | Status: AC
  Administered 2023-03-31: 50 ug via INTRAVENOUS
  Filled 2023-03-31: qty 1

## 2023-03-31 MED ORDER — SODIUM CHLORIDE 0.9 % IV BOLUS
1000.0000 mL | Freq: Once | INTRAVENOUS | Status: AC
  Administered 2023-03-31: 1000 mL via INTRAVENOUS

## 2023-03-31 MED ORDER — HYDROMORPHONE HCL 1 MG/ML IJ SOLN: 1.0000 mg | Freq: Four times a day (QID) | INTRAMUSCULAR | Status: DC | PRN

## 2023-03-31 MED ORDER — DEXAMETHASONE SODIUM PHOSPHATE 4 MG/ML IJ SOLN
4.0000 mg | Freq: Four times a day (QID) | INTRAMUSCULAR | Status: DC
  Administered 2023-03-31 – 2023-04-01 (×4): 4 mg via INTRAVENOUS
  Filled 2023-03-31 (×4): qty 1

## 2023-03-31 MED ORDER — SODIUM CHLORIDE 0.9 % IV SOLN: INTRAVENOUS | Status: DC

## 2023-03-31 MED ORDER — POLYETHYLENE GLYCOL 3350 17 G PO PACK: 17.0000 g | PACK | Freq: Every day | ORAL | Status: DC | PRN

## 2023-03-31 MED ORDER — PROCHLORPERAZINE EDISYLATE 10 MG/2ML IJ SOLN: 10.0000 mg | Freq: Four times a day (QID) | INTRAMUSCULAR | Status: DC | PRN

## 2023-03-31 MED ORDER — ACETAMINOPHEN 325 MG PO TABS: 650.0000 mg | ORAL_TABLET | Freq: Four times a day (QID) | ORAL | Status: DC | PRN

## 2023-03-31 MED ORDER — DEXAMETHASONE SODIUM PHOSPHATE 10 MG/ML IJ SOLN
10.0000 mg | Freq: Once | INTRAMUSCULAR | Status: AC
  Administered 2023-03-31: 10 mg via INTRAVENOUS
  Filled 2023-03-31: qty 1

## 2023-03-31 MED ORDER — NICOTINE 21 MG/24HR TD PT24
21.0000 mg | MEDICATED_PATCH | Freq: Every day | TRANSDERMAL | Status: DC
  Administered 2023-03-31 – 2023-04-01 (×2): 21 mg via TRANSDERMAL
  Filled 2023-03-31 (×2): qty 1

## 2023-03-31 MED ORDER — DIPHENHYDRAMINE HCL 50 MG/ML IJ SOLN
12.5000 mg | Freq: Once | INTRAMUSCULAR | Status: AC
  Administered 2023-03-31: 12.5 mg via INTRAVENOUS
  Filled 2023-03-31: qty 1

## 2023-03-31 MED ORDER — DOCUSATE SODIUM 100 MG PO CAPS
100.0000 mg | ORAL_CAPSULE | Freq: Two times a day (BID) | ORAL | Status: DC
  Administered 2023-03-31 – 2023-04-01 (×3): 100 mg via ORAL
  Filled 2023-03-31 (×3): qty 1

## 2023-03-31 MED ORDER — IOHEXOL 300 MG/ML  SOLN
100.0000 mL | Freq: Once | INTRAMUSCULAR | Status: AC | PRN
  Administered 2023-03-31: 100 mL via INTRAVENOUS

## 2023-03-31 MED ORDER — PROCHLORPERAZINE EDISYLATE 10 MG/2ML IJ SOLN
10.0000 mg | Freq: Once | INTRAMUSCULAR | Status: AC
  Administered 2023-03-31: 10 mg via INTRAVENOUS
  Filled 2023-03-31: qty 2

## 2023-03-31 NOTE — Assessment & Plan Note (Signed)
-   Continue the use of PPI. °

## 2023-03-31 NOTE — Assessment & Plan Note (Addendum)
Continue statin 

## 2023-03-31 NOTE — Assessment & Plan Note (Signed)
-  With complaining of intermittent vomiting -Appears to be associated with findings of brain mass and ongoing headaches. -Continue treatment with antiemetics. -Fluid resuscitation and supportive care will be provided.

## 2023-03-31 NOTE — ED Provider Notes (Signed)
[facility] AT [facility] Provider Note   CSN: [id number] Arrival date & time: 03/31/23  4098     History  Chief Complaint  Patient presents with   Headache    Matthew Rivera is a 63 y.o. male.   Headache Associated symptoms: dizziness, nausea and vomiting   Associated symptoms: no abdominal pain, no cough, no diarrhea, no fever, no neck pain, no neck stiffness, no numbness, no sore throat and no weakness         Matthew Rivera is a 62 y.o. male who presents to the [facility] complaining of frontal headache x 1 week.  Headache is been associated with intermittent nausea and vomiting with dizziness.  Describes dizziness as sensation of movement with standing or movement.  Denies any sensation of spinning.  Headache gradual in onset worse since yesterday.  Describes throbbing sensation to the front of his head that radiates to his temples.  Denies any neck pain or stiffness.  Had similar headaches several years ago but typically his headaches improve with over-the-counter medications.  He has taken Excedrin Migraine without relief.  Denies any chest pain, visual changes or auras, fever or chills, tick bite, abdominal pain chest pain or shortness of breath.     Home Medications Prior to Admission medications   Medication Sig Start Date End Date Taking? Authorizing Provider  aspirin EC 81 MG tablet Take 81 mg by mouth daily. Swallow whole.    [provider]  atorvastatin (LIPITOR) 20 MG tablet Take 20 mg by mouth daily. 07/22/20   [provider]  Coenzyme Q10 (CO Q 10) 100 MG CAPS Take 100 mg by mouth daily.    [provider]  diclofenac (VOLTAREN) 50 MG EC tablet Take 100 mg by mouth daily as needed (Tightness in the back).    [provider]  HYDROcodone-acetaminophen (NORCO) 10-325 MG tablet Take 1 tablet by mouth in the morning and at bedtime.    [provider]  naproxen (NAPROSYN) 500 MG  tablet Take 500 mg by mouth 2 (two) times daily as needed for mild pain or moderate pain.  06/30/20   [provider]  traMADol (ULTRAM) 50 MG tablet Take 100 mg by mouth daily as needed for moderate pain.  07/11/20   [provider]      Allergies    Patient has no known allergies.    Review of Systems   Review of Systems  Constitutional:  Positive for appetite change. Negative for chills and fever.  HENT:  Positive for trouble swallowing. Negative for sore throat.   Eyes:  Negative for visual disturbance.  Respiratory:  Negative for cough, shortness of breath and wheezing.   Cardiovascular:  Negative for chest pain.  Gastrointestinal:  Positive for nausea and vomiting. Negative for abdominal pain, constipation and diarrhea.  Genitourinary:  Negative for difficulty urinating, dysuria and frequency.  Musculoskeletal:  Negative for arthralgias, neck pain and neck stiffness.  Skin:  Negative for rash and wound.  Neurological:  Positive for dizziness and headaches. Negative for syncope, facial asymmetry, weakness and numbness.  Psychiatric/Behavioral:  Negative for confusion.     Physical Exam Updated Vital Signs BP 114/76   Pulse 62   Temp (!) 97.5 F (36.4 C) (Oral)   Resp 18   Ht 5\' 11"  (1.803 m)   Wt 69.4 kg   SpO2 100%   BMI 21.34 kg/m  Physical Exam Vitals and nursing note reviewed.  Constitutional:  General: He is not in acute distress.    Appearance: He is not toxic-appearing.     Comments: Patient is uncomfortable appearing, tearful  Eyes:     Extraocular Movements: Extraocular movements intact.     Right eye: No nystagmus.     Left eye: No nystagmus.     Pupils: Pupils are equal, round, and reactive to light.  Neck:     Meningeal: Kernig's sign absent.  Cardiovascular:     Rate and Rhythm: Normal rate and regular rhythm.  Pulmonary:     Effort: Pulmonary effort is normal. No respiratory distress.     Breath sounds: Wheezing present.   Abdominal:     General: There is no distension.     Palpations: Abdomen is soft.     Tenderness: There is no abdominal tenderness.  Musculoskeletal:        General: Normal range of motion.     Cervical back: Normal range of motion. No rigidity.  Lymphadenopathy:     Cervical: No cervical adenopathy.  Skin:    General: Skin is warm.     Capillary Refill: Capillary refill takes less than 2 seconds.     Findings: No rash.  Neurological:     General: No focal deficit present.     Mental Status: He is alert.     GCS: GCS eye subscore is 4. GCS verbal subscore is 5. GCS motor subscore is 6.     Cranial Nerves: No dysarthria.     Sensory: Sensation is intact. No sensory deficit.     Motor: Motor function is intact. No weakness.     Coordination: Coordination is intact. Coordination normal.     Comments: CN II through XII intact.  Speech clear.  No facial droop, no pronator drift     ED Results / Procedures / Treatments   Labs (all labs ordered are listed, but only abnormal results are displayed) Labs Reviewed  BASIC METABOLIC PANEL - Abnormal; Notable for the following components:      Result Value   Glucose, Bld 107 (*)    All other components within normal limits  CBC WITH DIFFERENTIAL/PLATELET  SEDIMENTATION RATE    EKG None  Radiology CT HEAD WO CONTRAST  Result Date: 03/31/2023 CLINICAL DATA:  Provided history: Headache, new onset. Additional history provided: Headache for one week. Nausea/vomiting. Dizziness. EXAM: CT HEAD WITHOUT CONTRAST TECHNIQUE: Contiguous axial images were obtained from the base of the skull through the vertex without intravenous contrast. RADIATION DOSE REDUCTION: This exam was performed according to the departmental dose-optimization program which includes automated exposure control, adjustment of the mA and/or kV according to patient size and/or use of iterative reconstruction technique. COMPARISON:  No pertinent prior exams available for  comparison. FINDINGS: Brain: No age advanced or lobar predominant parenchymal atrophy. 2.2 x 1.6 cm hyperdense mass within or along the inferolateral right cerebellar hemisphere (for instance as seen on series 4, image 58). Prominent edema within the right cerebellar hemisphere. Associated mass effect with partial effacement of the fourth ventricle. No evidence of obstructive hydrocephalus. Mild-to-moderate patchy and ill-defined hypoattenuation within the cerebral white matter, nonspecific but most often secondary to chronic small vessel ischemia. No demarcated cortical infarct. No extra-axial fluid collection. No supratentorial midline shift. Vascular: No hyperdense vessel.  Atherosclerotic calcifications. Skull: No fracture or aggressive osseous lesion. Sinuses/Orbits: Chronic, medially displaced fracture deformity of the left lamina papyracea. No mass or acute finding within the imaged orbits. Mild mucosal thickening scattered within the paranasal sinuses  at the imaged levels. Impression #1 called by telephone at the time of interpretation on 03/31/2023 at 10:14 am to provider Dr. Cathren Laine, who verbally acknowledged these results. IMPRESSION: 1. 2.2 x 1.6 cm mass within or along the inferolateral right cerebellar hemisphere. Prominent surrounding edema within the right cerebellar hemisphere. Associated mass effect with partial effacement of the fourth ventricle. No evidence of obstructive hydrocephalus. A brain MRI (with and without contrast) is recommended for further evaluation. 2. Mild-to-moderate cerebral white matter disease, nonspecific but most often secondary to chronic small vessel ischemia. Electronically Signed   By: Jackey Loge D.O.   On: 03/31/2023 10:16    Procedures Procedures    Medications Ordered in ED Medications  sodium chloride 0.9 % bolus 1,000 mL (0 mLs Intravenous Stopped 03/31/23 1016)    And  0.9 %  sodium chloride infusion ( Intravenous New Bag/Given 03/31/23 1016)   fentaNYL (SUBLIMAZE) injection 50 mcg (has no administration in time range)  dexamethasone (DECADRON) injection 10 mg (has no administration in time range)  prochlorperazine (COMPAZINE) injection 10 mg (10 mg Intravenous Given 03/31/23 0932)  diphenhydrAMINE (BENADRYL) injection 12.5 mg (12.5 mg Intravenous Given 03/31/23 0931)    ED Course/ Medical Decision Making/ A&P                             Medical Decision Making Patient here with headache of gradual onset frontal in location.  Described as throbbing headache has been associated with intermittent nausea and vomiting and dizziness with position change and movement.  Denies visual changes, unilateral weakness, facial weakness or dysarthria.  Differential would include but not limited to viral illness, tick bite, subarachnoid hemorrhage, brain mass, meningitis, temporal arteritis, atypical migraine, cluster headache  Amount and/or Complexity of Data Reviewed Labs: ordered.    Details: Labs unremarkable Radiology: ordered.    Details: CT head shows a 2 x 2 cm mass inferior lateral right cerebellar hemisphere with surrounding edema and mass effect with partial effacement of the fourth ventricle  MRI brain recommended for further evaluation. Discussion of management or test interpretation with external provider(s): Patient received IV Compazine and Benadryl here along with IV fluids.  On recheck, states pain has improved   Patient also seen by Dr. Denton Lank care plan discussed.  Neurosurgery [name] consulted by Dr. Denton Lank, recommends patient have CT chest abdomen and pelvis for further evaluation of possible primary source.  Decadron and patient admitted to medicine with transfer to [facility] hospital[facility], Dr. Calton Golds who agrees to admit and arrange transfer  Risk Prescription drug management. Decision regarding hospitalization.             Final Clinical Impression(s) / ED Diagnoses Final diagnoses:   Brain mass  Intractable headache, unspecified chronicity pattern, unspecified headache type    Rx / DC Orders ED Discharge Orders     None         Pauline Aus, PA-C 03/31/23 1234    Cathren Laine, MD 04/06/23 1353

## 2023-03-31 NOTE — Assessment & Plan Note (Signed)
-  Cessation counseling provided -Nicotine patch has been ordered. 

## 2023-03-31 NOTE — Assessment & Plan Note (Signed)
-  appears to be associated with positive brain mass with midline shift and mass effect. -Continue as needed analgesics -Patient has been started on Decadron -Neurosurgery consulted with recommendations to transfer to [facility] for further evaluation and management. -Follow MRI results

## 2023-03-31 NOTE — Consult Note (Signed)
Reason for Consult: Right cerebellar brain tumor Referring Physician: Dr. Caren Griffins Matthew Rivera is an 62 y.o. male.  HPI: The patient is a 62 year old right-handed white male long-term smoker who has had about a 2-week history of headaches, nausea, vomiting, incoordination, etc.  He went to the [facility], ER where he was evaluated by Dr. Ardeen Jourdain.  I was contacted and recommended a brain MRI as well as a metastatic workup.  The patient had a brain MRI and a CT of the chest abdomen pelvis which demonstrated likely lung cancer.  He is admitted by Dr. Gwenlyn Perking.  Presently the patient is alert and pleasant and at times tearful.  He gives a history as above. Past Medical History:  Diagnosis Date   Dyslipidemia    Osteoarthritis     Past Surgical History:  Procedure Laterality Date   BACK SURGERY     COLONOSCOPY WITH PROPOFOL N/A 08/18/2020   Procedure: COLONOSCOPY WITH PROPOFOL;  Surgeon: Lanelle Bal, DO;  [facility];  Service: Endoscopy;  Laterality: N/A;  7:30am   POLYPECTOMY  08/18/2020   Procedure: POLYPECTOMY INTESTINAL;  Surgeon: Lanelle Bal, DO;  [facility];  Service: Endoscopy;;    Family History  Problem Relation Age of Onset   Diabetes Father    Asthma Father     Social History:  reports that he has been smoking cigarettes. He has never used smokeless tobacco. He reports current alcohol use. He reports that he does not use drugs.  Allergies: No Known Allergies  Medications: I have reviewed the patient's current medications. Prior to Admission:  Medications Prior to Admission  Medication Sig Dispense Refill Last Dose   HYDROcodone-acetaminophen (NORCO) 10-325 MG tablet Take 1 tablet by mouth in the morning, at noon, in the evening, and at bedtime.   03/30/2023   tamsulosin (FLOMAX) 0.4 MG CAPS capsule Take 0.4 mg by mouth at bedtime as needed (prostate).   prn   traMADol (ULTRAM) 50 MG tablet Take 100 mg by mouth in the morning, at noon, in  the evening, and at bedtime. 50mg -100mg  QID   03/30/2023   zolpidem (AMBIEN) 10 MG tablet Take 10 mg by mouth at bedtime as needed for sleep.   prn   Scheduled:  atorvastatin  20 mg Oral Daily   dexamethasone (DECADRON) injection  4 mg Intravenous Q6H   docusate sodium  100 mg Oral BID   heparin injection (subcutaneous)  5,000 Units Subcutaneous Q8H   nicotine  21 mg Transdermal Daily   pantoprazole  40 mg Oral BID   Continuous:  sodium chloride 125 mL/hr at 03/31/23 1615   WGN:[id number] **OR** acetaminophen, HYDROmorphone (DILAUDID) injection, polyethylene glycol, prochlorperazine, traMADol Anti-infectives (From admission, onward)    None        Results for orders placed or performed during the hospital encounter of 03/31/23 (from the past 48 hour(s))  CBC with Differential/Platelet     Status: None   Collection Time: 03/31/23  9:16 AM  Result Value Ref Range   WBC 6.9 4.0 - 10.5 K/uL   RBC 5.64 4.22 - 5.81 MIL/uL   Hemoglobin 16.5 13.0 - 17.0 g/dL   HCT 96.2 95.2 - 84.1 %   MCV 89.7 80.0 - 100.0 fL   MCH 29.3 26.0 - 34.0 pg   MCHC 32.6 30.0 - 36.0 g/dL   RDW 32.4 40.1 - 02.7 %   Platelets 228 150 - 400 K/uL   nRBC 0.0 0.0 - 0.2 %  Neutrophils Relative % 79 %   Neutro Abs 5.4 1.7 - 7.7 K/uL   Lymphocytes Relative 10 %   Lymphs Abs 0.7 0.7 - 4.0 K/uL   Monocytes Relative 7 %   Monocytes Absolute 0.5 0.1 - 1.0 K/uL   Eosinophils Relative 3 %   Eosinophils Absolute 0.2 0.0 - 0.5 K/uL   Basophils Relative 1 %   Basophils Absolute 0.0 0.0 - 0.1 K/uL   Immature Granulocytes 0 %   Abs Immature Granulocytes 0.03 0.00 - 0.07 K/uL    Comment: Performed at [facility], [address], [place], [place] [address]  Basic metabolic panel     Status: Abnormal   Collection Time: 03/31/23  9:16 AM  Result Value Ref Range   Sodium 135 135 - 145 mmol/L   Potassium 4.0 3.5 - 5.1 mmol/L   Chloride 100 98 - 111 mmol/L   CO2 24 22 - 32 mmol/L   Glucose, Bld 107 (H) 70 - 99 mg/dL     Comment: Glucose reference range applies only to samples taken after fasting for at least 8 hours.   BUN 18 8 - 23 mg/dL   Creatinine, Ser 1.30 0.61 - 1.24 mg/dL   Calcium 9.5 8.9 - 86.5 mg/dL   GFR, Estimated >78 >46 mL/min    Comment: (NOTE) Calculated using the CKD-EPI Creatinine Equation (2021)    Anion gap 11 5 - 15    Comment: Performed at [facility], [address] [place], [place] [address]  Sedimentation rate     Status: None   Collection Time: 03/31/23  9:16 AM  Result Value Ref Range   Sed Rate 13 0 - 16 mm/hr    Comment: Performed at [facility], [address] [place], [place] [address]  HIV Antibody (routine testing w rflx)     Status: None   Collection Time: 03/31/23 12:13 PM  Result Value Ref Range   HIV Screen 4th Generation wRfx Non Reactive Non Reactive    Comment: Performed at [facility], [address] [place], [place] [address]  Magnesium     Status: None   Collection Time: 03/31/23 12:13 PM  Result Value Ref Range   Magnesium 2.0 1.7 - 2.4 mg/dL    Comment: Performed at [facility], [address], [place], [place] [address]  Phosphorus     Status: None   Collection Time: 03/31/23 12:13 PM  Result Value Ref Range   Phosphorus 3.3 2.5 - 4.6 mg/dL    Comment: Performed at [facility], [address] [place], [place] [address]    CT CHEST ABDOMEN PELVIS W CONTRAST  Result Date: 03/31/2023 CLINICAL DATA:  Brain lesions on recent MRI. Evaluate for possible primary tumor. * Tracking Code: BO * EXAM: CT CHEST, ABDOMEN, AND PELVIS WITH CONTRAST TECHNIQUE: Multidetector CT imaging of the chest, abdomen and pelvis was performed following the standard protocol during bolus administration of intravenous contrast. RADIATION DOSE REDUCTION: This exam was performed according to the departmental dose-optimization program which includes automated exposure control, adjustment of the mA and/or kV according to patient size and/or use of iterative reconstruction  technique. CONTRAST:  OMNIPAQUE IOHEXOL 300 MG/ML  SOLN COMPARISON:  None Available. FINDINGS: CT CHEST FINDINGS Cardiovascular: The heart is normal in size. No pericardial effusion. The aorta is normal in caliber. No dissection. Scattered coronary artery calcifications. Mediastinum/Nodes: Necrotic nodal mass in the subcarinal station measuring a maximum of 4.0 x 2.5 cm. There is also a mildly necrotic  left hilar node measuring 13 mm on image 36/3. The esophagus is grossly normal. Lungs/Pleura: 5 cm spiculated central left lobe lung mass consistent with primary lung neoplasm. Subpleural nodular density in the right lower lobe posteriorly could be a metastatic lesion or an area of rounded atelectasis. Other small patchy subpleural densities are noted in the superior segment of the right lower lobe and also in the left lower lobe. Indeterminate findings requiring surveillance. Underlying emphysematous changes pulmonary scarring. Musculoskeletal: No significant bony findings. CT ABDOMEN PELVIS FINDINGS Hepatobiliary: None no hepatic lesions are identified. No intrahepatic biliary dilatation. The gallbladder is unremarkable. Normal caliber common bile duct. Pancreas: Normal Spleen: Splenomegaly. The spleen measures 14.513.510.0 cm. Small calcified granulomas. No splenic lesions. Adrenals/Urinary Tract: The adrenal glands and kidneys are. No worrisome renal or adrenal gland lesions. The bladder is unremarkable. Stomach/Bowel: No significant findings. Vascular/Lymphatic: Age advanced atherosclerotic calcifications involving the aorta and iliac arteries but no aneurysm or dissection. The major venous structures are patent. No mesenteric or retroperitoneal mass or adenopathy. Reproductive: The prostate gland and seminal vesicles are. There is a large left hemiscrotum hydrocele noted. Other: No pelvic mass or adenopathy. No free pelvic fluid collections. No inguinal mass or adenopathy. No abdominal wall hernia or  subcutaneous lesions. Musculoskeletal: No significant bony findings. No lytic or sclerotic bone lesions. IMPRESSION: 1. 5 cm spiculated central left lobe lung mass consistent with primary lung neoplasm. 2. Necrotic nodal mass in the subcarinal station and left hilar region consistent with metastatic disease. 3. Subpleural nodular density in the right lower lobe posteriorly could be a metastatic lesion or an area of rounded atelectasis. Other small patchy subpleural densities are noted in the superior segment of the right lower lobe and also in the left lower lobe. Indeterminate findings requiring surveillance. 4. No findings for metastatic disease involving the abdomen/pelvis or bony structures. 5. Splenomegaly of uncertain etiology. 6. Age advanced atherosclerotic calcifications involving the aorta and iliac arteries. 7. Large left hemiscrotum hydrocele. 8. Aortic atherosclerosis. Aortic Atherosclerosis (ICD10-I70.0). Electronically Signed   By: Rudie Meyer M.D.   On: 03/31/2023 12:28   MR Brain W and Wo Contrast  Addendum Date: 03/31/2023   ADDENDUM REPORT: 03/31/2023 12:04 ADDENDUM: Dural-based metastasis is a differential consideration. Electronically Signed   By: Orvan Falconer M.D.   On: 03/31/2023 12:04   Result Date: 03/31/2023 CLINICAL DATA:  Headache, new onset (Age >= 51y) mass on CT. EXAM: MRI HEAD WITHOUT AND WITH CONTRAST TECHNIQUE: Multiplanar, multiecho pulse sequences of the brain and surrounding structures were obtained without and with intravenous contrast. CONTRAST:  7mL GADAVIST GADOBUTROL 1 MMOL/ML IV SOLN COMPARISON:  Head CT 03/31/2023. FINDINGS: Brain: 23 x 23 mm dural-based mass along the posterior aspect of the right petrous temporal bone with associated dural tail (image 14 series 19), favored to represent a meningioma. Extensive vasogenic edema throughout the right cerebellar hemisphere with partial effacement of the fourth ventricle and mass effect on brainstem. No acute  infarct or hemorrhage. Mild chronic small-vessel disease. Lateral and third ventricles are nondilated, though there is confluence T2 hyperintensity along occipital horns, which could represent developing hydrocephalus. Vascular: Normal flow voids and vessel enhancement. Skull and upper cervical spine: Normal marrow signal and enhancement. Sinuses/Orbits: Unremarkable. Other: None. IMPRESSION: 1. 23 mm dural-based mass along the posterior aspect of the right petrous temporal bone with associated dural tail, favored to represent a meningioma. Extensive vasogenic edema throughout the right cerebellar hemisphere with partial effacement of the fourth ventricle and mass effect on  brainstem. 2. Lateral and third ventricles are nondilated, though there is confluence T2 hyperintensity along occipital horns, which could represent developing hydrocephalus. Electronically Signed: By: Orvan Falconer M.D. On: 03/31/2023 11:55   CT HEAD WO CONTRAST  Result Date: 03/31/2023 CLINICAL DATA:  Provided history: Headache, new onset. Additional history provided: Headache for one week. Nausea/vomiting. Dizziness. EXAM: CT HEAD WITHOUT CONTRAST TECHNIQUE: Contiguous axial images were obtained from the base of the skull through the vertex without intravenous contrast. RADIATION DOSE REDUCTION: This exam was performed according to the departmental dose-optimization program which includes automated exposure control, adjustment of the mA and/or kV according to patient size and/or use of iterative reconstruction technique. COMPARISON:  No pertinent prior exams available for comparison. FINDINGS: Brain: No age advanced or lobar predominant parenchymal atrophy. 2.2 x 1.6 cm hyperdense mass within or along the inferolateral right cerebellar hemisphere (for instance as seen on series 4, image 58). Prominent edema within the right cerebellar hemisphere. Associated mass effect with partial effacement of the fourth ventricle. No evidence of  obstructive hydrocephalus. Mild-to-moderate patchy and ill-defined hypoattenuation within the cerebral white matter, nonspecific but most often secondary to chronic small vessel ischemia. No demarcated cortical infarct. No extra-axial fluid collection. No supratentorial midline shift. Vascular: No hyperdense vessel.  Atherosclerotic calcifications. Skull: No fracture or aggressive osseous lesion. Sinuses/Orbits: Chronic, medially displaced fracture deformity of the left lamina papyracea. No mass or acute finding within the imaged orbits. Mild mucosal thickening scattered within the paranasal sinuses at the imaged levels. Impression #1 called by telephone at the time of interpretation on 03/31/2023 at 10:14 am to provider Dr. Cathren Laine, who verbally acknowledged these results. IMPRESSION: 1. 2.2 x 1.6 cm mass within or along the inferolateral right cerebellar hemisphere. Prominent surrounding edema within the right cerebellar hemisphere. Associated mass effect with partial effacement of the fourth ventricle. No evidence of obstructive hydrocephalus. A brain MRI (with and without contrast) is recommended for further evaluation. 2. Mild-to-moderate cerebral white matter disease, nonspecific but most often secondary to chronic small vessel ischemia. Electronically Signed   By: Jackey Loge D.O.   On: 03/31/2023 10:16    ROS: As above Blood pressure 133/74, pulse 69, temperature 98.2 F (36.8 C), temperature source Oral, resp. rate 16, height 5\' 11"  (1.803 m), weight 69.4 kg, SpO2 100 %. Estimated body mass index is 21.34 kg/m as calculated from the following:   Height as of this encounter: 5\' 11"  (1.803 m).   Weight as of this encounter: 69.4 kg.  Physical Exam  General: An thin alert and pleasant but at times tearful 62 year old white male in no apparent distress.  HEENT: Normocephalic, atraumatic, extraocular muscles are intact  Neck: Supple  Thorax: Symmetric  Abdomen: Soft  Extremities:  Unremarkable  Neurologic exam: The patient is alert and oriented x 3.  Cranial nerves II through XII are examined bilaterally grossly normal.  The patient's motor strength is 5/5 in spinal bicep, tricep, handgrip, quadricep, gastrocnemius and dorsiflexors.  Cerebellar function demonstrates dysmetria to finger-to-nose on the right.  Sensory function is intact to light touch sensation in all tested dermatomes bilaterally.  Imaging studies:  I have reviewed the patient's head CT and brain MRI performed today.  He has a solitary enhancing right cerebellar mass with significant edema.  There is a small amount of dural enhancement.  There is no hydrocephalus.  Assessment/Plan: Brain tumor, lung cancer: I have told him that this is almost certainly a metastasis from his lung cancer.  We have discussed  the various treatment options including doing nothing, empiric radiation, and surgery.  I recommended the latter.  I described a right suboccipital craniectomy for resection of the tumor.  We discussed the risk including risk of anesthesia, hemorrhage, infection, incomplete tumor resection, tumor regrowth, medical risks, etc.  I have answered all his questions.  He has decided to proceed with surgery.  We will get this arranged sometime next week.  In the meantime we will continue with steroids.  Cristi Loron 03/31/2023, 6:02 PM

## 2023-03-31 NOTE — ED Notes (Signed)
Called [facility] for transport. 

## 2023-03-31 NOTE — ED Notes (Signed)
Patient transported to MRI/CT.  

## 2023-03-31 NOTE — ED Triage Notes (Signed)
Pt c/o headache for one week, N/V and dizziness. Pt states his headache is in the front of his head. Pt states he has had a headache like this yrs ago. Closing his eyes and being in the dark helps relieve the pain some.

## 2023-03-31 NOTE — Progress Notes (Signed)
   03/31/23 1614  Vitals  Temp 98.2 F (36.8 C)  Temp Source Oral  BP 133/74  MAP (mmHg) 94  BP Location Left Arm  BP Method Automatic  Patient Position (if appropriate) Sitting  Pulse Rate 69  Pulse Rate Source Monitor  Resp 16  MEWS COLOR  MEWS Score Color Green  Oxygen Therapy  SpO2 100 %  O2 Device Room Air  Pain Assessment  Pain Scale 0-10  MEWS Score  MEWS Temp 0  MEWS Systolic 0  MEWS Pulse 0  MEWS RR 0  MEWS LOC 0  MEWS Score 0    Arrived to [facility] 3W room [phone]. Vital signs obtained and settled into bed. Oriented to room and call bell use.

## 2023-03-31 NOTE — H&P (Signed)
History and Physical    Patient: Matthew Rivera [id number]:[id number] DOB: [birth year] DOA: 03/31/2023 DOS: the patient was seen and examined on 03/31/2023 PCP: [name], MD  Patient coming from: Home  Chief Complaint:  Chief Complaint  Patient presents with   Headache   HPI: Matthew Rivera is a 62 y.o. male with medical history significant of dyslipidemia, chronic back pain from osteoarthritis and tobacco abuse; presented to the hospital secondary to headaches with associated dizziness, nausea and vomiting.  Patient reports symptoms have been present for the last 7-10 days and worsening. Patient reports no chest pain, no shortness of breath, no fever, no chills, no sick contacts, no dysuria, no hematuria or focal weaknesses.  Patient reported similar headaches in the past that improve by over-the-counter medication, rest and closing his eyes in a black room.  This time patient's symptoms has failed to improve and he decided to seek medical attention.  Workup in the ED with a CT scan of the head demonstrating positive 2.2 by 1.6 brain mass with swelling, mass effect and midline shift.  Patient was hemodynamically stable.  Case discussed with neurosurgery service ([name]) who recommended treatment with Decadron and transferred to [facility] for further evaluation and management.  Review of Systems: As mentioned in the history of present illness. All other systems reviewed and are negative. Past Medical History:  Diagnosis Date   Dyslipidemia    Osteoarthritis    Past Surgical History:  Procedure Laterality Date   BACK SURGERY     COLONOSCOPY WITH PROPOFOL N/A 08/18/2020   Procedure: COLONOSCOPY WITH PROPOFOL;  Surgeon: Lanelle Bal, DO;  [facility];  Service: Endoscopy;  Laterality: N/A;  7:30am   POLYPECTOMY  08/18/2020   Procedure: POLYPECTOMY INTESTINAL;  Surgeon: Lanelle Bal, DO;  [facility];  Service: Endoscopy;;   Social History:   reports that he has been smoking cigarettes. He has never used smokeless tobacco. He reports current alcohol use. He reports that he does not use drugs.  No Known Allergies  Family History  Problem Relation Age of Onset   Diabetes Father    Asthma Father     Prior to Admission medications   Medication Sig Start Date End Date Taking? Authorizing Provider  HYDROcodone-acetaminophen (NORCO) 10-325 MG tablet Take 1 tablet by mouth in the morning, at noon, in the evening, and at bedtime.   Yes [provider]  tamsulosin (FLOMAX) 0.4 MG CAPS capsule Take 0.4 mg by mouth at bedtime as needed (prostate). 03/01/23  Yes [provider]  traMADol (ULTRAM) 50 MG tablet Take 100 mg by mouth in the morning, at noon, in the evening, and at bedtime. 50mg -100mg  QID 07/11/20  Yes [provider]  zolpidem (AMBIEN) 10 MG tablet Take 10 mg by mouth at bedtime as needed for sleep. 12/22/22  Yes [provider]    Physical Exam: Vitals:   03/31/23 0846 03/31/23 0900 03/31/23 0930 03/31/23 1045  BP:  114/76 138/82 138/79  Pulse:  62 61 73  Resp: 18     Temp:      TempSrc:      SpO2:  100% 100% 100%  Weight:      Height:       General exam: Alert, awake, oriented x 3; reporting some headache (improved after analgesics given in ED. Respiratory system: Clear to auscultation. Respiratory effort normal. No using accessory muscles, good saturation on room air. Cardiovascular system:RRR. No murmurs, rubs, gallops.  Gastrointestinal system: Abdomen is nondistended, soft and nontender. No organomegaly or masses felt. Normal bowel sounds heard. Central nervous system: Alert and oriented. No focal neurological deficits. Extremities: No C/C/E, +pedal pulses Skin: No rashes, lesions or ulcers Psychiatry: Judgement and insight appear normal.  Flat affect.  Data Reviewed: CBC: White blood cell 6.9, hemoglobin 16.5 and platelet count 228 K Basic metabolic panel: Sodium 135,  potassium 4.0, chloride 100, bicarb 24, BUN 18, creatinine 0.87 Sedimentation rate: 13  Assessment and Plan: * Headache -appears to be associated with positive brain mass with midline shift and mass effect. -Continue as needed analgesics -Patient has been started on Decadron -Neurosurgery consulted with recommendations to transfer to [facility] for further evaluation and management. -Follow MRI results  Nausea -With complaining of intermittent vomiting -Appears to be associated with findings of brain mass and ongoing headaches. -Continue treatment with antiemetics. -Fluid resuscitation and supportive care will be provided.  HLD (hyperlipidemia) -Continue statin. -Heart healthy diet discussed with patient.  GERD (gastroesophageal reflux disease) -Continue the use of PPI.  Tobacco abuse -Cessation counseling provided -Nicotine patch has been ordered.      Advance Care Planning:   Code Status: Full Code   Consults: Neurosurgery ([name]).  Family Communication: Wife at bedside  Severity of Illness: The appropriate patient status for this patient is OBSERVATION. Observation status is judged to be reasonable and necessary in order to provide the required intensity of service to ensure the patient's safety. The patient's presenting symptoms, physical exam findings, and initial radiographic and laboratory data in the context of their medical condition is felt to place them at decreased risk for further clinical deterioration. Furthermore, it is anticipated that the patient will be medically stable for discharge from the hospital within 2 midnights of admission.   Author: Vassie Loll, MD 03/31/2023 1:30 PM  For on call review [email or web address].

## 2023-04-01 ENCOUNTER — Other Ambulatory Visit (HOSPITAL_COMMUNITY): Payer: Self-pay

## 2023-04-01 ENCOUNTER — Other Ambulatory Visit: Payer: Self-pay | Admitting: Neurosurgery

## 2023-04-01 ENCOUNTER — Telehealth: Payer: Self-pay | Admitting: Medical Oncology

## 2023-04-01 DIAGNOSIS — R519 Headache, unspecified: Secondary | ICD-10-CM | POA: Diagnosis not present

## 2023-04-01 LAB — CBC
HCT: 42.4 % (ref 39.0–52.0)
Hemoglobin: 13.6 g/dL (ref 13.0–17.0)
MCH: 28.5 pg (ref 26.0–34.0)
MCHC: 32.1 g/dL (ref 30.0–36.0)
MCV: 88.7 fL (ref 80.0–100.0)
Platelets: 202 10*3/uL (ref 150–400)
RBC: 4.78 MIL/uL (ref 4.22–5.81)
RDW: 13.1 % (ref 11.5–15.5)
WBC: 6.8 10*3/uL (ref 4.0–10.5)
nRBC: 0 % (ref 0.0–0.2)

## 2023-04-01 LAB — BASIC METABOLIC PANEL
Anion gap: 11 (ref 5–15)
BUN: 14 mg/dL (ref 8–23)
CO2: 22 mmol/L (ref 22–32)
Calcium: 8.7 mg/dL — ABNORMAL LOW (ref 8.9–10.3)
Chloride: 106 mmol/L (ref 98–111)
Creatinine, Ser: 0.9 mg/dL (ref 0.61–1.24)
GFR, Estimated: >60 mL/min (ref >60)
Glucose, Bld: 135 mg/dL — ABNORMAL HIGH (ref 70–99)
Potassium: 4 mmol/L (ref 3.5–5.1)
Sodium: 139 mmol/L (ref 135–145)

## 2023-04-01 MED ORDER — NICOTINE 21 MG/24HR TD PT24
21.0000 mg | MEDICATED_PATCH | Freq: Every day | TRANSDERMAL | 0 refills | Status: DC
  Filled 2023-04-01: qty 28, 28d supply, fill #0

## 2023-04-01 MED ORDER — DEXAMETHASONE 4 MG PO TABS
4.0000 mg | ORAL_TABLET | Freq: Four times a day (QID) | ORAL | Status: DC
  Administered 2023-04-01: 4 mg via ORAL
  Filled 2023-04-01: qty 1

## 2023-04-01 MED ORDER — PANTOPRAZOLE SODIUM 40 MG PO TBEC
40.0000 mg | DELAYED_RELEASE_TABLET | Freq: Two times a day (BID) | ORAL | 2 refills | Status: DC
  Filled 2023-04-01: qty 60, 30d supply, fill #0

## 2023-04-01 MED ORDER — DEXAMETHASONE 4 MG PO TABS
4.0000 mg | ORAL_TABLET | Freq: Three times a day (TID) | ORAL | 0 refills | Status: DC
  Filled 2023-04-01: qty 30, 10d supply, fill #0

## 2023-04-01 NOTE — Care Management Obs Status (Signed)
MEDICARE OBSERVATION STATUS NOTIFICATION   Patient Details  Name: Matthew Rivera MRN: [id number] Date of Birth: 1961-06-21   Medicare Observation Status Notification Given:  Yes    Tom-Johnson, Hershal Coria, RN 04/01/2023, 2:10 PM

## 2023-04-01 NOTE — Progress Notes (Signed)
Providing Compassionate, Quality Care - Together   Subjective: Patient reports his symptoms have improved with steroids.  Objective: Vital signs in last 24 hours: Temp:  [97.7 F (36.5 C)-98.2 F (36.8 C)] 98.1 F (36.7 C) (06/21 0740) Pulse Rate:  [61-78] 74 (06/21 0740) Resp:  [16-18] 18 (06/21 0740) BP: (114-156)/(74-85) 117/81 (06/21 0740) SpO2:  [97 %-100 %] 97 % (06/21 0740)  Intake/Output from previous day: 06/20 0701 - 06/21 0700 In: 2844.4 [P.O.:520; I.V.:2324.4] Out: 500 [Urine:500] Intake/Output this shift: No intake/output data recorded.  Alert and oriented x 4 PERRLA Speech clear CN II-XII grossly intact MAE, Strength and sensation intact   Lab Results: Recent Labs    03/31/23 0916 04/01/23 0703  WBC 6.9 6.8  HGB 16.5 13.6  HCT 50.6 42.4  PLT 228 202   BMET Recent Labs    03/31/23 0916 04/01/23 0703  NA 135 139  K 4.0 4.0  CL 100 106  CO2 24 22  GLUCOSE 107* 135*  BUN 18 14  CREATININE 0.87 0.90  CALCIUM 9.5 8.7*    Studies/Results: CT CHEST ABDOMEN PELVIS W CONTRAST  Result Date: 03/31/2023 CLINICAL DATA:  Brain lesions on recent MRI. Evaluate for possible primary tumor. * Tracking Code: BO * EXAM: CT CHEST, ABDOMEN, AND PELVIS WITH CONTRAST TECHNIQUE: Multidetector CT imaging of the chest, abdomen and pelvis was performed following the standard protocol during bolus administration of intravenous contrast. RADIATION DOSE REDUCTION: This exam was performed according to the departmental dose-optimization program which includes automated exposure control, adjustment of the mA and/or kV according to patient size and/or use of iterative reconstruction technique. CONTRAST:  OMNIPAQUE IOHEXOL 300 MG/ML  SOLN COMPARISON:  None Available. FINDINGS: CT CHEST FINDINGS Cardiovascular: The heart is normal in size. No pericardial effusion. The aorta is normal in caliber. No dissection. Scattered coronary artery calcifications. Mediastinum/Nodes:  Necrotic nodal mass in the subcarinal station measuring a maximum of 4.0 x 2.5 cm. There is also a mildly necrotic left hilar node measuring 13 mm on image 36/3. The esophagus is grossly normal. Lungs/Pleura: 5 cm spiculated central left lobe lung mass consistent with primary lung neoplasm. Subpleural nodular density in the right lower lobe posteriorly could be a metastatic lesion or an area of rounded atelectasis. Other small patchy subpleural densities are noted in the superior segment of the right lower lobe and also in the left lower lobe. Indeterminate findings requiring surveillance. Underlying emphysematous changes pulmonary scarring. Musculoskeletal: No significant bony findings. CT ABDOMEN PELVIS FINDINGS Hepatobiliary: None no hepatic lesions are identified. No intrahepatic biliary dilatation. The gallbladder is unremarkable. Normal caliber common bile duct. Pancreas: Normal Spleen: Splenomegaly. The spleen measures 14.513.510.0 cm. Small calcified granulomas. No splenic lesions. Adrenals/Urinary Tract: The adrenal glands and kidneys are. No worrisome renal or adrenal gland lesions. The bladder is unremarkable. Stomach/Bowel: No significant findings. Vascular/Lymphatic: Age advanced atherosclerotic calcifications involving the aorta and iliac arteries but no aneurysm or dissection. The major venous structures are patent. No mesenteric or retroperitoneal mass or adenopathy. Reproductive: The prostate gland and seminal vesicles are. There is a large left hemiscrotum hydrocele noted. Other: No pelvic mass or adenopathy. No free pelvic fluid collections. No inguinal mass or adenopathy. No abdominal wall hernia or subcutaneous lesions. Musculoskeletal: No significant bony findings. No lytic or sclerotic bone lesions. IMPRESSION: 1. 5 cm spiculated central left lobe lung mass consistent with primary lung neoplasm. 2. Necrotic nodal mass in the subcarinal station and left hilar region consistent with metastatic  disease. 3.  Subpleural nodular density in the right lower lobe posteriorly could be a metastatic lesion or an area of rounded atelectasis. Other small patchy subpleural densities are noted in the superior segment of the right lower lobe and also in the left lower lobe. Indeterminate findings requiring surveillance. 4. No findings for metastatic disease involving the abdomen/pelvis or bony structures. 5. Splenomegaly of uncertain etiology. 6. Age advanced atherosclerotic calcifications involving the aorta and iliac arteries. 7. Large left hemiscrotum hydrocele. 8. Aortic atherosclerosis. Aortic Atherosclerosis (ICD10-I70.0). Electronically Signed   By: Rudie Meyer M.D.   On: 03/31/2023 12:28   MR Brain W and Wo Contrast  Addendum Date: 03/31/2023   ADDENDUM REPORT: 03/31/2023 12:04 ADDENDUM: Dural-based metastasis is a differential consideration. Electronically Signed   By: Orvan Falconer M.D.   On: 03/31/2023 12:04   Result Date: 03/31/2023 CLINICAL DATA:  Headache, new onset (Age >= 51y) mass on CT. EXAM: MRI HEAD WITHOUT AND WITH CONTRAST TECHNIQUE: Multiplanar, multiecho pulse sequences of the brain and surrounding structures were obtained without and with intravenous contrast. CONTRAST:  7mL GADAVIST GADOBUTROL 1 MMOL/ML IV SOLN COMPARISON:  Head CT 03/31/2023. FINDINGS: Brain: 23 x 23 mm dural-based mass along the posterior aspect of the right petrous temporal bone with associated dural tail (image 14 series 19), favored to represent a meningioma. Extensive vasogenic edema throughout the right cerebellar hemisphere with partial effacement of the fourth ventricle and mass effect on brainstem. No acute infarct or hemorrhage. Mild chronic small-vessel disease. Lateral and third ventricles are nondilated, though there is confluence T2 hyperintensity along occipital horns, which could represent developing hydrocephalus. Vascular: Normal flow voids and vessel enhancement. Skull and upper cervical spine:  Normal marrow signal and enhancement. Sinuses/Orbits: Unremarkable. Other: None. IMPRESSION: 1. 23 mm dural-based mass along the posterior aspect of the right petrous temporal bone with associated dural tail, favored to represent a meningioma. Extensive vasogenic edema throughout the right cerebellar hemisphere with partial effacement of the fourth ventricle and mass effect on brainstem. 2. Lateral and third ventricles are nondilated, though there is confluence T2 hyperintensity along occipital horns, which could represent developing hydrocephalus. Electronically Signed: By: Orvan Falconer M.D. On: 03/31/2023 11:55   CT HEAD WO CONTRAST  Result Date: 03/31/2023 CLINICAL DATA:  Provided history: Headache, new onset. Additional history provided: Headache for one week. Nausea/vomiting. Dizziness. EXAM: CT HEAD WITHOUT CONTRAST TECHNIQUE: Contiguous axial images were obtained from the base of the skull through the vertex without intravenous contrast. RADIATION DOSE REDUCTION: This exam was performed according to the departmental dose-optimization program which includes automated exposure control, adjustment of the mA and/or kV according to patient size and/or use of iterative reconstruction technique. COMPARISON:  No pertinent prior exams available for comparison. FINDINGS: Brain: No age advanced or lobar predominant parenchymal atrophy. 2.2 x 1.6 cm hyperdense mass within or along the inferolateral right cerebellar hemisphere (for instance as seen on series 4, image 58). Prominent edema within the right cerebellar hemisphere. Associated mass effect with partial effacement of the fourth ventricle. No evidence of obstructive hydrocephalus. Mild-to-moderate patchy and ill-defined hypoattenuation within the cerebral white matter, nonspecific but most often secondary to chronic small vessel ischemia. No demarcated cortical infarct. No extra-axial fluid collection. No supratentorial midline shift. Vascular: No  hyperdense vessel.  Atherosclerotic calcifications. Skull: No fracture or aggressive osseous lesion. Sinuses/Orbits: Chronic, medially displaced fracture deformity of the left lamina papyracea. No mass or acute finding within the imaged orbits. Mild mucosal thickening scattered within the paranasal sinuses at the imaged levels.  Impression #1 called by telephone at the time of interpretation on 03/31/2023 at 10:14 am to provider Dr. Cathren Laine, who verbally acknowledged these results. IMPRESSION: 1. 2.2 x 1.6 cm mass within or along the inferolateral right cerebellar hemisphere. Prominent surrounding edema within the right cerebellar hemisphere. Associated mass effect with partial effacement of the fourth ventricle. No evidence of obstructive hydrocephalus. A brain MRI (with and without contrast) is recommended for further evaluation. 2. Mild-to-moderate cerebral white matter disease, nonspecific but most often secondary to chronic small vessel ischemia. Electronically Signed   By: Jackey Loge D.O.   On: 03/31/2023 10:16    Assessment/Plan: Patient with nausea, vomiting, headaches, and decreased coordination over the last two weeks. Imaging studies show a right cerebellar brain tumor that is likely metastatic lung cancer.   LOS: 0 days   -Continue steroids. -Will discuss with the Hospitalist team the possibility of discharging this patient home on steroids. Would plan for scheduled craniotomy next week with [name].   Val Eagle, DNP, AGNP-C Nurse Practitioner  [facility] [address], [address], [place], [place] [address] P: [phone]    F: [phone]  04/01/2023, 8:54 AM

## 2023-04-01 NOTE — Discharge Summary (Signed)
Physician Discharge Summary  Matthew Rivera RUE:[id number] DOB: 1960-11-28 DOA: 03/31/2023  PCP: [name], MD  Admit date: 03/31/2023 Discharge date: 04/01/2023  Time spent: 44 minutes  Recommendations for Outpatient Follow-up:  Requires outpatient coordination of care with oncology pulmonology as well as neurosurgeon [name] sent and appointments for all specialty services to be placed on AVS by respective consultants-greatly appreciated Will need 1 week follow-up with [name] for labs etc.  Discharge Diagnoses:  MAIN problem for hospitalization   Metastatic lung cancer with brain metastases + frontotemporal headaches resulting from this Current quarter pack per day smoker Hyperglycemia without evidence of DM TY 2   Please see below for itemized issues addressed in HOpsital- refer to other progress notes for clarity if needed  Discharge Condition: Improved  Diet recommendation: Heart healthy  Filed Weights   03/31/23 0840  Weight: 69.4 kg    History of present illness:  62 year old male Previous tobacco use, hyperglycemia Right lung opacity which was nonspecific documented as early as 01/2011 Colonoscopy 08/2020 showed tubular adenomas and transverse as well as sigmoid colon negative for high-grade dysplasia   Presented to emergency room at [facility] with headache in the front of his head punctuated by intermittent nausea vomiting dizziness with throbbing sensation radiating to the temples took Excedrin Migraine without any relief CT head showed 2X 2 cm inferior right cerebellar hemispheric mass with surrounding cytotoxic edema and mass effect MRI brain performed 6/20 showing 23 mm dural based mass along posterior right petrous temporal bone favored to represent a meningioma with extensive vasogenic edema throughout the right cerebellar hemisphere with partial effacement of the fourth ventricle and mass effect on brainstem-also possible early hydrocephalus    6/20 patient seen by neurosurgery [name] who recommends right suboccipital craniectomy and tumor resection             CT abdomen chest pelvis shows 5 cm spiculated R LLL lung mass, necrotic nodal mass and subcarinal station Subpleural nodule density right lower lobe posteriorly with metastatic lesion or rounded atelectasis Splenomegaly of uncertain etiology  Patient was seen and examined by neurosurgeon [name] and workup was completed as above with CT abdomen chest pelvis--patient's deficits seems to have completely resolved at time of evaluation by myself as well as neurosurgery Patient was able to walk halfway around the unit without any unsteadiness-did not have any seizure-like activity and did not have any ataxia Patient was cleared by neurosurgery for outpatient follow-up to discuss right suboccipital craniectomy for tumor resection and neurosurgery elected to continue Decadron as per below-does not need AEDs secondary to mass being in the cerebellum with less likelihood of seizure activity I have counseled him clearly to stop smoking and to follow-up closely with pulmonology as per AVS [name] lung navigator [name] will arrange an outpatient follow-up for the patient to be seen at oncology office  Patient had met discharge criteria and was discharged home in a stable state-medications were called into transition of care pharmacy   Discharge Exam: Vitals:   04/01/23 0350 04/01/23 0740  BP: 127/79 117/81  Pulse: 61 74  Resp: 16 18  Temp: 97.7 F (36.5 C) 98.1 F (36.7 C)  SpO2: 99% 97%    Subj on day of d/c   Awake no distress looks well feels okay States that he has no further dizziness or headache  General Exam on discharge  EOMI NCAT no focal deficit finger-nose-finger intact Chest clear Abdomen soft no rebound Power 5/5  Gait is intact he has a little bit of leaning to the right but self corrects rapidly and walked >400 feet with me Power is  5/5 Reflexes are slightly brisk in major neuro myotomes  Discharge Instructions    Allergies as of 04/01/2023   No Known Allergies      Medication List     TAKE these medications    dexamethasone 4 MG tablet Commonly known as: DECADRON Take 1 tablet (4 mg total) by mouth 3 (three) times daily.   HYDROcodone-acetaminophen 10-325 MG tablet Commonly known as: NORCO Take 1 tablet by mouth in the morning, at noon, in the evening, and at bedtime.   nicotine 21 mg/24hr patch Commonly known as: NICODERM CQ - dosed in mg/24 hours Place 1 patch (21 mg total) onto the skin daily. Start taking on: April 02, 2023   pantoprazole 40 MG tablet Commonly known as: PROTONIX Take 1 tablet (40 mg total) by mouth 2 (two) times daily.   tamsulosin 0.4 MG Caps capsule Commonly known as: FLOMAX Take 0.4 mg by mouth at bedtime as needed (prostate).   traMADol 50 MG tablet Commonly known as: ULTRAM Take 100 mg by mouth in the morning, at noon, in the evening, and at bedtime. 50mg -100mg  QID   zolpidem 10 MG tablet Commonly known as: AMBIEN Take 10 mg by mouth at bedtime as needed for sleep.       No Known Allergies    The results of significant diagnostics from this hospitalization (including imaging, microbiology, ancillary and laboratory) are listed below for reference.    Significant Diagnostic Studies: CT CHEST ABDOMEN PELVIS W CONTRAST  Result Date: 03/31/2023 CLINICAL DATA:  Brain lesions on recent MRI. Evaluate for possible primary tumor. * Tracking Code: BO * EXAM: CT CHEST, ABDOMEN, AND PELVIS WITH CONTRAST TECHNIQUE: Multidetector CT imaging of the chest, abdomen and pelvis was performed following the standard protocol during bolus administration of intravenous contrast. RADIATION DOSE REDUCTION: This exam was performed according to the departmental dose-optimization program which includes automated exposure control, adjustment of the mA and/or kV according to patient size  and/or use of iterative reconstruction technique. CONTRAST:  OMNIPAQUE IOHEXOL 300 MG/ML  SOLN COMPARISON:  None Available. FINDINGS: CT CHEST FINDINGS Cardiovascular: The heart is normal in size. No pericardial effusion. The aorta is normal in caliber. No dissection. Scattered coronary artery calcifications. Mediastinum/Nodes: Necrotic nodal mass in the subcarinal station measuring a maximum of 4.0 x 2.5 cm. There is also a mildly necrotic left hilar node measuring 13 mm on image 36/3. The esophagus is grossly normal. Lungs/Pleura: 5 cm spiculated central left lobe lung mass consistent with primary lung neoplasm. Subpleural nodular density in the right lower lobe posteriorly could be a metastatic lesion or an area of rounded atelectasis. Other small patchy subpleural densities are noted in the superior segment of the right lower lobe and also in the left lower lobe. Indeterminate findings requiring surveillance. Underlying emphysematous changes pulmonary scarring. Musculoskeletal: No significant bony findings. CT ABDOMEN PELVIS FINDINGS Hepatobiliary: None no hepatic lesions are identified. No intrahepatic biliary dilatation. The gallbladder is unremarkable. Normal caliber common bile duct. Pancreas: Normal Spleen: Splenomegaly. The spleen measures 14.513.510.0 cm. Small calcified granulomas. No splenic lesions. Adrenals/Urinary Tract: The adrenal glands and kidneys are. No worrisome renal or adrenal gland lesions. The bladder is unremarkable. Stomach/Bowel: No significant findings. Vascular/Lymphatic: Age advanced atherosclerotic calcifications involving the aorta and iliac arteries but no aneurysm or dissection. The major venous structures are patent. No mesenteric or  retroperitoneal mass or adenopathy. Reproductive: The prostate gland and seminal vesicles are. There is a large left hemiscrotum hydrocele noted. Other: No pelvic mass or adenopathy. No free pelvic fluid collections. No inguinal mass or  adenopathy. No abdominal wall hernia or subcutaneous lesions. Musculoskeletal: No significant bony findings. No lytic or sclerotic bone lesions. IMPRESSION: 1. 5 cm spiculated central left lobe lung mass consistent with primary lung neoplasm. 2. Necrotic nodal mass in the subcarinal station and left hilar region consistent with metastatic disease. 3. Subpleural nodular density in the right lower lobe posteriorly could be a metastatic lesion or an area of rounded atelectasis. Other small patchy subpleural densities are noted in the superior segment of the right lower lobe and also in the left lower lobe. Indeterminate findings requiring surveillance. 4. No findings for metastatic disease involving the abdomen/pelvis or bony structures. 5. Splenomegaly of uncertain etiology. 6. Age advanced atherosclerotic calcifications involving the aorta and iliac arteries. 7. Large left hemiscrotum hydrocele. 8. Aortic atherosclerosis. Aortic Atherosclerosis (ICD10-I70.0). Electronically Signed   By: Rudie Meyer M.D.   On: 03/31/2023 12:28   MR Brain W and Wo Contrast  Addendum Date: 03/31/2023   ADDENDUM REPORT: 03/31/2023 12:04 ADDENDUM: Dural-based metastasis is a differential consideration. Electronically Signed   By: Orvan Falconer M.D.   On: 03/31/2023 12:04   Result Date: 03/31/2023 CLINICAL DATA:  Headache, new onset (Age >= 51y) mass on CT. EXAM: MRI HEAD WITHOUT AND WITH CONTRAST TECHNIQUE: Multiplanar, multiecho pulse sequences of the brain and surrounding structures were obtained without and with intravenous contrast. CONTRAST:  7mL GADAVIST GADOBUTROL 1 MMOL/ML IV SOLN COMPARISON:  Head CT 03/31/2023. FINDINGS: Brain: 23 x 23 mm dural-based mass along the posterior aspect of the right petrous temporal bone with associated dural tail (image 14 series 19), favored to represent a meningioma. Extensive vasogenic edema throughout the right cerebellar hemisphere with partial effacement of the fourth ventricle and  mass effect on brainstem. No acute infarct or hemorrhage. Mild chronic small-vessel disease. Lateral and third ventricles are nondilated, though there is confluence T2 hyperintensity along occipital horns, which could represent developing hydrocephalus. Vascular: Normal flow voids and vessel enhancement. Skull and upper cervical spine: Normal marrow signal and enhancement. Sinuses/Orbits: Unremarkable. Other: None. IMPRESSION: 1. 23 mm dural-based mass along the posterior aspect of the right petrous temporal bone with associated dural tail, favored to represent a meningioma. Extensive vasogenic edema throughout the right cerebellar hemisphere with partial effacement of the fourth ventricle and mass effect on brainstem. 2. Lateral and third ventricles are nondilated, though there is confluence T2 hyperintensity along occipital horns, which could represent developing hydrocephalus. Electronically Signed: By: Orvan Falconer M.D. On: 03/31/2023 11:55   CT HEAD WO CONTRAST  Result Date: 03/31/2023 CLINICAL DATA:  Provided history: Headache, new onset. Additional history provided: Headache for one week. Nausea/vomiting. Dizziness. EXAM: CT HEAD WITHOUT CONTRAST TECHNIQUE: Contiguous axial images were obtained from the base of the skull through the vertex without intravenous contrast. RADIATION DOSE REDUCTION: This exam was performed according to the departmental dose-optimization program which includes automated exposure control, adjustment of the mA and/or kV according to patient size and/or use of iterative reconstruction technique. COMPARISON:  No pertinent prior exams available for comparison. FINDINGS: Brain: No age advanced or lobar predominant parenchymal atrophy. 2.2 x 1.6 cm hyperdense mass within or along the inferolateral right cerebellar hemisphere (for instance as seen on series 4, image 58). Prominent edema within the right cerebellar hemisphere. Associated mass effect with partial effacement of  the  fourth ventricle. No evidence of obstructive hydrocephalus. Mild-to-moderate patchy and ill-defined hypoattenuation within the cerebral white matter, nonspecific but most often secondary to chronic small vessel ischemia. No demarcated cortical infarct. No extra-axial fluid collection. No supratentorial midline shift. Vascular: No hyperdense vessel.  Atherosclerotic calcifications. Skull: No fracture or aggressive osseous lesion. Sinuses/Orbits: Chronic, medially displaced fracture deformity of the left lamina papyracea. No mass or acute finding within the imaged orbits. Mild mucosal thickening scattered within the paranasal sinuses at the imaged levels. Impression #1 called by telephone at the time of interpretation on 03/31/2023 at 10:14 am to provider Dr. Cathren Laine, who verbally acknowledged these results. IMPRESSION: 1. 2.2 x 1.6 cm mass within or along the inferolateral right cerebellar hemisphere. Prominent surrounding edema within the right cerebellar hemisphere. Associated mass effect with partial effacement of the fourth ventricle. No evidence of obstructive hydrocephalus. A brain MRI (with and without contrast) is recommended for further evaluation. 2. Mild-to-moderate cerebral white matter disease, nonspecific but most often secondary to chronic small vessel ischemia. Electronically Signed   By: Jackey Loge D.O.   On: 03/31/2023 10:16    Microbiology: No results found for this or any previous visit (from the past 240 hour(s)).   Labs: Basic Metabolic Panel: Recent Labs  Lab 03/31/23 0916 03/31/23 1213 04/01/23 0703  NA 135  --  139  K 4.0  --  4.0  CL 100  --  106  CO2 24  --  22  GLUCOSE 107*  --  135*  BUN 18  --  14  CREATININE 0.87  --  0.90  CALCIUM 9.5  --  8.7*  MG  --  2.0  --   PHOS  --  3.3  --    Liver Function Tests: No results for input(s): "AST", "ALT", "ALKPHOS", "BILITOT", "PROT", "ALBUMIN" in the last 168 hours. No results for input(s): "LIPASE", "AMYLASE" in  the last 168 hours. No results for input(s): "AMMONIA" in the last 168 hours. CBC: Recent Labs  Lab 03/31/23 0916 04/01/23 0703  WBC 6.9 6.8  NEUTROABS 5.4  --   HGB 16.5 13.6  HCT 50.6 42.4  MCV 89.7 88.7  PLT 228 202   Cardiac Enzymes: No results for input(s): "CKTOTAL", "CKMB", "CKMBINDEX", "TROPONINI" in the last 168 hours. BNP: BNP (last 3 results) No results for input(s): "BNP" in the last 8760 hours.  ProBNP (last 3 results) No results for input(s): "PROBNP" in the last 8760 hours.  CBG: No results for input(s): "GLUCAP" in the last 168 hours.     Signed:  Rhetta Mura MD   [facility] 04/01/2023, 11:43 AM

## 2023-04-01 NOTE — Telephone Encounter (Signed)
Neurosurgery would like you to see pt " next available". Surgery next week and pulmonology end of July .  When do you want to see pt?Marland Kitchen

## 2023-04-01 NOTE — Progress Notes (Signed)
Pt discharged at this time.  Has all belongings with him and medications.  All questions answered.

## 2023-04-01 NOTE — TOC Transition Note (Signed)
Transition of Care [facility]) - CM/SW Discharge Note   Patient Details  Name: Matthew Rivera MRN: [id number] Date of Birth: 29-May-1961  Transition of Care [facility]) CM/SW Contact:  Tom-Johnson, Hershal Coria, RN Phone Number: 04/01/2023, 3:52 PM   Clinical Narrative:     Patient is scheduled for discharge today.  Readmission Risk Assessment done. Outpatient f/u, hospital f/u and discharge instructions on AVS. Prescriptions sent to [facility] pharmacy and meds will be delivered to patient at bedside prior discharge. No TOC needs or recommendations noted. Wife, Matthew Rivera at bedside and will transport at discharge.  No further TOC needs noted.       Final next level of care: Home/Self Care Barriers to Discharge: Barriers Resolved   Patient Goals and CMS Choice CMS Medicare.gov Compare Post Acute Care list provided to:: Patient Choice offered to / list presented to : Patient, Spouse  Discharge Placement                  Patient to be transferred to facility by: Wife Name of family member notified: Matthew Rivera    Discharge Plan and Services Additional resources added to the After Visit Summary for                  DME Arranged: N/A DME Agency: NA       HH Arranged: NA HH Agency: NA        Social Determinants of Health (SDOH) Interventions SDOH Screenings   Food Insecurity: No Food Insecurity (03/31/2023)  Housing: Low Risk  (03/31/2023)  Transportation Needs: No Transportation Needs (03/31/2023)  Utilities: Not At Risk (03/31/2023)  Tobacco Use: High Risk (03/31/2023)     Readmission Risk Interventions     No data to display

## 2023-04-01 NOTE — Progress Notes (Signed)
PCCM Progress Note   Notified by Dr. Mahala Menghini need for outpatient pulmonary follow up for lung mass suspicion for metastatic lung cancer with mets to the brain. Confirmed that there are no inpatient pulmonary needs at this time. NSGY is planning for craniotomy next week and therefore will obtain tissue samples at that time. Outpatient apt was made with Dr. Durel Salts 05/10/2023 at 1530 for assistance in management and supportive care of likely metastatic lung cancer. Oncology has also been consulted.   Lonna Rabold D. Harris, NP-C [facility] Personal contact information can be found on Amion  If no contact or response made please call [phone] 04/01/2023, 11:48 AM

## 2023-04-06 ENCOUNTER — Other Ambulatory Visit: Payer: Self-pay

## 2023-04-06 ENCOUNTER — Encounter (HOSPITAL_COMMUNITY): Payer: Self-pay | Admitting: Neurosurgery

## 2023-04-06 NOTE — Progress Notes (Signed)
PCP - Dr [name] Cardiologist - none  CT Chest x-ray - 03/31/23 EKG - n/a Stress Test - n/a ECHO - n/a Cardiac Cath - n/a  ICD Pacemaker/Loop - n/a  Sleep Study -  n/a CPAP - none  Diabetes Type - n/a  ERAS: Clear liquids til 2:30 PM DOS.  STOP now taking any Aspirin (unless otherwise instructed by your surgeon), Aleve, Naproxen, Ibuprofen, Motrin, Advil, Goody's, BC's, all herbal medications, fish oil, and all vitamins.   Coronavirus Screening Do you have any of the following symptoms:  Cough yes/no: No Fever (>100.42F)  yes/no: No Runny nose yes/no: No Sore throat yes/no: No Difficulty breathing/shortness of breath  yes/no: No  Have you traveled in the last 14 days and where? yes/no: No  Patient's wife [name] verbalized understanding of instructions that were given via phone.

## 2023-04-07 ENCOUNTER — Inpatient Hospital Stay (HOSPITAL_COMMUNITY): Payer: 59 | Admitting: Registered Nurse

## 2023-04-07 ENCOUNTER — Inpatient Hospital Stay (HOSPITAL_COMMUNITY): Payer: 59

## 2023-04-07 ENCOUNTER — Inpatient Hospital Stay: Admit: 2023-04-07 | Payer: 59 | Admitting: Neurosurgery

## 2023-04-07 ENCOUNTER — Encounter (HOSPITAL_COMMUNITY)
Admission: RE | Disposition: A | Discharge status: Home / Self Care | Payer: Self-pay | Source: Home / Self Care | Attending: Neurosurgery

## 2023-04-07 ENCOUNTER — Inpatient Hospital Stay (HOSPITAL_COMMUNITY)
Admission: RE | Admit: 2023-04-07 | Discharge: 2023-04-09 | DRG: 026 | Disposition: A | Discharge status: Home / Self Care | Payer: 59 | Attending: Neurosurgery | Admitting: Neurosurgery

## 2023-04-07 ENCOUNTER — Encounter (HOSPITAL_COMMUNITY): Payer: Self-pay | Admitting: Neurosurgery

## 2023-04-07 DIAGNOSIS — F17210 Nicotine dependence, cigarettes, uncomplicated: Secondary | ICD-10-CM

## 2023-04-07 DIAGNOSIS — Z833 Family history of diabetes mellitus: Secondary | ICD-10-CM

## 2023-04-07 DIAGNOSIS — C7931 Secondary malignant neoplasm of brain: Principal | ICD-10-CM | POA: Diagnosis present

## 2023-04-07 DIAGNOSIS — K219 Gastro-esophageal reflux disease without esophagitis: Secondary | ICD-10-CM | POA: Diagnosis present

## 2023-04-07 DIAGNOSIS — E785 Hyperlipidemia, unspecified: Secondary | ICD-10-CM | POA: Diagnosis present

## 2023-04-07 DIAGNOSIS — R112 Nausea with vomiting, unspecified: Secondary | ICD-10-CM | POA: Diagnosis not present

## 2023-04-07 DIAGNOSIS — D496 Neoplasm of unspecified behavior of brain: Secondary | ICD-10-CM | POA: Diagnosis present

## 2023-04-07 DIAGNOSIS — G9389 Other specified disorders of brain: Principal | ICD-10-CM

## 2023-04-07 DIAGNOSIS — Z825 Family history of asthma and other chronic lower respiratory diseases: Secondary | ICD-10-CM

## 2023-04-07 DIAGNOSIS — C716 Malignant neoplasm of cerebellum: Secondary | ICD-10-CM | POA: Diagnosis not present

## 2023-04-07 DIAGNOSIS — F17200 Nicotine dependence, unspecified, uncomplicated: Secondary | ICD-10-CM | POA: Diagnosis not present

## 2023-04-07 DIAGNOSIS — C3490 Malignant neoplasm of unspecified part of unspecified bronchus or lung: Secondary | ICD-10-CM | POA: Diagnosis present

## 2023-04-07 HISTORY — PX: CRANIOTOMY: SHX93

## 2023-04-07 HISTORY — DX: Nicotine dependence, unspecified, uncomplicated: F17.200

## 2023-04-07 HISTORY — DX: Gastro-esophageal reflux disease without esophagitis: K21.9

## 2023-04-07 LAB — TYPE AND SCREEN
ABO/RH(D): O POS
Antibody Screen: NEGATIVE

## 2023-04-07 LAB — ABO/RH: ABO/RH(D): O POS

## 2023-04-07 LAB — GLUCOSE, CAPILLARY: Glucose-Capillary: 117 mg/dL — ABNORMAL HIGH (ref 70–99)

## 2023-04-07 SURGERY — CRANIOTOMY TUMOR EXCISION
Anesthesia: General | Laterality: Right

## 2023-04-07 MED ORDER — THROMBIN 5000 UNITS EX SOLR
OROMUCOSAL | Status: DC | PRN
  Administered 2023-04-07: 5 mL via TOPICAL

## 2023-04-07 MED ORDER — CHLORHEXIDINE GLUCONATE CLOTH 2 % EX PADS
6.0000 | MEDICATED_PAD | Freq: Every day | CUTANEOUS | Status: DC
  Administered 2023-04-08 – 2023-04-09 (×2): 6 via TOPICAL

## 2023-04-07 MED ORDER — CHLORHEXIDINE GLUCONATE CLOTH 2 % EX PADS: 6.0000 | MEDICATED_PAD | Freq: Once | CUTANEOUS | Status: DC

## 2023-04-07 MED ORDER — MORPHINE SULFATE (PF) 2 MG/ML IV SOLN
2.0000 mg | INTRAVENOUS | Status: DC | PRN
  Administered 2023-04-08: 2 mg via INTRAVENOUS
  Filled 2023-04-07: qty 1

## 2023-04-07 MED ORDER — TAMSULOSIN HCL 0.4 MG PO CAPS: 0.4000 mg | ORAL_CAPSULE | Freq: Every evening | ORAL | Status: DC | PRN

## 2023-04-07 MED ORDER — ESMOLOL HCL 100 MG/10ML IV SOLN
INTRAVENOUS | Status: DC | PRN
  Administered 2023-04-07 (×2): 20 mg via INTRAVENOUS

## 2023-04-07 MED ORDER — POTASSIUM CHLORIDE IN NACL 20-0.9 MEQ/L-% IV SOLN
INTRAVENOUS | Status: DC
  Filled 2023-04-07 (×3): qty 1000

## 2023-04-07 MED ORDER — PROMETHAZINE HCL 12.5 MG PO TABS: 12.5000 mg | ORAL_TABLET | ORAL | Status: DC | PRN

## 2023-04-07 MED ORDER — OXYCODONE HCL 5 MG PO TABS: 5.0000 mg | ORAL_TABLET | Freq: Once | ORAL | Status: DC | PRN

## 2023-04-07 MED ORDER — LABETALOL HCL 5 MG/ML IV SOLN
INTRAVENOUS | Status: DC | PRN
  Administered 2023-04-07 (×2): 5 mg via INTRAVENOUS

## 2023-04-07 MED ORDER — ONDANSETRON HCL 4 MG/2ML IJ SOLN
4.0000 mg | INTRAMUSCULAR | Status: DC | PRN
  Administered 2023-04-08 (×3): 4 mg via INTRAVENOUS
  Filled 2023-04-07 (×3): qty 2

## 2023-04-07 MED ORDER — FENTANYL CITRATE (PF) 100 MCG/2ML IJ SOLN
INTRAMUSCULAR | Status: AC
  Filled 2023-04-07: qty 2

## 2023-04-07 MED ORDER — FENTANYL CITRATE (PF) 250 MCG/5ML IJ SOLN
INTRAMUSCULAR | Status: DC | PRN
  Administered 2023-04-07: 50 ug via INTRAVENOUS
  Administered 2023-04-07: 25 ug via INTRAVENOUS
  Administered 2023-04-07: 150 ug via INTRAVENOUS
  Administered 2023-04-07: 25 ug via INTRAVENOUS

## 2023-04-07 MED ORDER — CEFAZOLIN SODIUM-DEXTROSE 2-4 GM/100ML-% IV SOLN
2.0000 g | Freq: Three times a day (TID) | INTRAVENOUS | Status: AC
  Administered 2023-04-07 – 2023-04-08 (×2): 2 g via INTRAVENOUS
  Filled 2023-04-07 (×2): qty 100

## 2023-04-07 MED ORDER — ORAL CARE MOUTH RINSE: 15.0000 mL | OROMUCOSAL | Status: DC | PRN

## 2023-04-07 MED ORDER — ROCURONIUM BROMIDE 10 MG/ML (PF) SYRINGE
PREFILLED_SYRINGE | INTRAVENOUS | Status: AC
  Filled 2023-04-07: qty 60

## 2023-04-07 MED ORDER — CEFAZOLIN SODIUM-DEXTROSE 2-4 GM/100ML-% IV SOLN: 2.0000 g | INTRAVENOUS | Status: DC

## 2023-04-07 MED ORDER — ACETAMINOPHEN 650 MG RE SUPP: 650.0000 mg | RECTAL | Status: DC | PRN

## 2023-04-07 MED ORDER — CEFAZOLIN SODIUM-DEXTROSE 2-4 GM/100ML-% IV SOLN
2.0000 g | INTRAVENOUS | Status: AC
  Administered 2023-04-07: 2 g via INTRAVENOUS
  Filled 2023-04-07: qty 100

## 2023-04-07 MED ORDER — MIDAZOLAM HCL 2 MG/2ML IJ SOLN: 1.0000 mg | Freq: Once | INTRAMUSCULAR | Status: AC

## 2023-04-07 MED ORDER — FENTANYL CITRATE (PF) 250 MCG/5ML IJ SOLN
INTRAMUSCULAR | Status: AC
  Filled 2023-04-07: qty 5

## 2023-04-07 MED ORDER — LIDOCAINE 2% (20 MG/ML) 5 ML SYRINGE
INTRAMUSCULAR | Status: DC | PRN
  Administered 2023-04-07: 50 mg via INTRAVENOUS

## 2023-04-07 MED ORDER — SODIUM CHLORIDE 0.9 % IV SOLN: INTRAVENOUS | Status: DC

## 2023-04-07 MED ORDER — NICOTINE 21 MG/24HR TD PT24
21.0000 mg | MEDICATED_PATCH | Freq: Every day | TRANSDERMAL | Status: DC
  Administered 2023-04-08 – 2023-04-09 (×2): 21 mg via TRANSDERMAL
  Filled 2023-04-07 (×2): qty 1

## 2023-04-07 MED ORDER — PHENYLEPHRINE 80 MCG/ML (10ML) SYRINGE FOR IV PUSH (FOR BLOOD PRESSURE SUPPORT)
PREFILLED_SYRINGE | INTRAVENOUS | Status: DC | PRN
  Administered 2023-04-07: 160 ug via INTRAVENOUS

## 2023-04-07 MED ORDER — BACITRACIN ZINC 500 UNIT/GM EX OINT
TOPICAL_OINTMENT | CUTANEOUS | Status: DC | PRN
  Administered 2023-04-07: 1 via TOPICAL

## 2023-04-07 MED ORDER — AMISULPRIDE (ANTIEMETIC) 5 MG/2ML IV SOLN
INTRAVENOUS | Status: AC
  Filled 2023-04-07: qty 4

## 2023-04-07 MED ORDER — OXYCODONE HCL 5 MG/5ML PO SOLN: 5.0000 mg | Freq: Once | ORAL | Status: DC | PRN

## 2023-04-07 MED ORDER — ACETAMINOPHEN 325 MG PO TABS: 650.0000 mg | ORAL_TABLET | ORAL | Status: DC | PRN

## 2023-04-07 MED ORDER — 0.9 % SODIUM CHLORIDE (POUR BTL) OPTIME
TOPICAL | Status: DC | PRN
  Administered 2023-04-07 (×2): 1000 mL

## 2023-04-07 MED ORDER — BACITRACIN ZINC 500 UNIT/GM EX OINT
TOPICAL_OINTMENT | CUTANEOUS | Status: AC
  Filled 2023-04-07: qty 28.35

## 2023-04-07 MED ORDER — MIDAZOLAM HCL 2 MG/2ML IJ SOLN
INTRAMUSCULAR | Status: AC
  Administered 2023-04-07: 1 mg via INTRAVENOUS
  Filled 2023-04-07: qty 2

## 2023-04-07 MED ORDER — DEXAMETHASONE SODIUM PHOSPHATE 10 MG/ML IJ SOLN
INTRAMUSCULAR | Status: DC | PRN
  Administered 2023-04-07: 10 mg via INTRAVENOUS

## 2023-04-07 MED ORDER — HYDROCODONE-ACETAMINOPHEN 10-325 MG PO TABS
1.0000 | ORAL_TABLET | ORAL | Status: DC | PRN
  Administered 2023-04-08 – 2023-04-09 (×6): 1 via ORAL
  Filled 2023-04-07 (×6): qty 1

## 2023-04-07 MED ORDER — ONDANSETRON HCL 4 MG/2ML IJ SOLN
INTRAMUSCULAR | Status: DC | PRN
  Administered 2023-04-07: 4 mg via INTRAVENOUS

## 2023-04-07 MED ORDER — MIDAZOLAM HCL 2 MG/2ML IJ SOLN
INTRAMUSCULAR | Status: AC
  Filled 2023-04-07: qty 2

## 2023-04-07 MED ORDER — SODIUM CHLORIDE 0.9 % IV SOLN: INTRAVENOUS | Status: DC | PRN

## 2023-04-07 MED ORDER — ONDANSETRON HCL 4 MG PO TABS: 4.0000 mg | ORAL_TABLET | ORAL | Status: DC | PRN

## 2023-04-07 MED ORDER — PROPOFOL 10 MG/ML IV BOLUS
INTRAVENOUS | Status: AC
  Filled 2023-04-07: qty 20

## 2023-04-07 MED ORDER — LABETALOL HCL 5 MG/ML IV SOLN
10.0000 mg | INTRAVENOUS | Status: DC | PRN
  Administered 2023-04-08: 20 mg via INTRAVENOUS
  Filled 2023-04-07: qty 4

## 2023-04-07 MED ORDER — THROMBIN 20000 UNITS EX SOLR
CUTANEOUS | Status: DC | PRN
  Administered 2023-04-07: 20 mL via TOPICAL

## 2023-04-07 MED ORDER — ONDANSETRON HCL 4 MG/2ML IJ SOLN: 4.0000 mg | Freq: Four times a day (QID) | INTRAMUSCULAR | Status: DC | PRN

## 2023-04-07 MED ORDER — AMISULPRIDE (ANTIEMETIC) 5 MG/2ML IV SOLN
10.0000 mg | Freq: Once | INTRAVENOUS | Status: AC
  Administered 2023-04-07: 10 mg via INTRAVENOUS

## 2023-04-07 MED ORDER — CHLORHEXIDINE GLUCONATE 0.12 % MT SOLN
15.0000 mL | Freq: Once | OROMUCOSAL | Status: AC
  Administered 2023-04-07: 15 mL via OROMUCOSAL
  Filled 2023-04-07: qty 15

## 2023-04-07 MED ORDER — FENTANYL CITRATE (PF) 100 MCG/2ML IJ SOLN
25.0000 ug | INTRAMUSCULAR | Status: DC | PRN
  Administered 2023-04-07 (×2): 50 ug via INTRAVENOUS

## 2023-04-07 MED ORDER — ROCURONIUM BROMIDE 10 MG/ML (PF) SYRINGE
PREFILLED_SYRINGE | INTRAVENOUS | Status: DC | PRN
  Administered 2023-04-07: 60 mg via INTRAVENOUS
  Administered 2023-04-07: 40 mg via INTRAVENOUS
  Administered 2023-04-07: 30 mg via INTRAVENOUS

## 2023-04-07 MED ORDER — ONDANSETRON HCL 4 MG/2ML IJ SOLN
INTRAMUSCULAR | Status: AC
  Administered 2023-04-07: 4 mg via INTRAVENOUS
  Filled 2023-04-07: qty 2

## 2023-04-07 MED ORDER — THROMBIN 5000 UNITS EX SOLR
CUTANEOUS | Status: AC
  Filled 2023-04-07: qty 5000

## 2023-04-07 MED ORDER — DEXAMETHASONE SODIUM PHOSPHATE 10 MG/ML IJ SOLN
6.0000 mg | Freq: Four times a day (QID) | INTRAMUSCULAR | Status: AC
  Administered 2023-04-08 (×4): 6 mg via INTRAVENOUS
  Filled 2023-04-07 (×4): qty 1

## 2023-04-07 MED ORDER — FENTANYL CITRATE (PF) 100 MCG/2ML IJ SOLN
INTRAMUSCULAR | Status: AC
  Administered 2023-04-07: 50 ug via INTRAVENOUS
  Filled 2023-04-07: qty 2

## 2023-04-07 MED ORDER — PHENYLEPHRINE HCL-NACL 20-0.9 MG/250ML-% IV SOLN
INTRAVENOUS | Status: DC | PRN
  Administered 2023-04-07: 25 ug/min via INTRAVENOUS

## 2023-04-07 MED ORDER — ONDANSETRON HCL 4 MG/2ML IJ SOLN: 4.0000 mg | Freq: Once | INTRAMUSCULAR | Status: AC

## 2023-04-07 MED ORDER — DEXAMETHASONE SODIUM PHOSPHATE 4 MG/ML IJ SOLN: 4.0000 mg | Freq: Three times a day (TID) | INTRAMUSCULAR | Status: DC

## 2023-04-07 MED ORDER — DEXAMETHASONE SODIUM PHOSPHATE 4 MG/ML IJ SOLN
4.0000 mg | Freq: Four times a day (QID) | INTRAMUSCULAR | Status: DC
  Administered 2023-04-08 – 2023-04-09 (×2): 4 mg via INTRAVENOUS
  Filled 2023-04-07 (×2): qty 1

## 2023-04-07 MED ORDER — FENTANYL CITRATE (PF) 100 MCG/2ML IJ SOLN: 50.0000 ug | Freq: Once | INTRAMUSCULAR | Status: AC

## 2023-04-07 MED ORDER — PROPOFOL 10 MG/ML IV BOLUS
INTRAVENOUS | Status: DC | PRN
  Administered 2023-04-07: 40 mg via INTRAVENOUS
  Administered 2023-04-07: 200 mg via INTRAVENOUS

## 2023-04-07 MED ORDER — BUPIVACAINE-EPINEPHRINE (PF) 0.25% -1:200000 IJ SOLN
INTRAMUSCULAR | Status: DC | PRN
  Administered 2023-04-07: 20 mL

## 2023-04-07 MED ORDER — THROMBIN 20000 UNITS EX SOLR
CUTANEOUS | Status: AC
  Filled 2023-04-07: qty 20000

## 2023-04-07 MED ORDER — PANTOPRAZOLE SODIUM 40 MG PO TBEC
40.0000 mg | DELAYED_RELEASE_TABLET | Freq: Two times a day (BID) | ORAL | Status: DC
  Administered 2023-04-08 – 2023-04-09 (×4): 40 mg via ORAL
  Filled 2023-04-07 (×4): qty 1

## 2023-04-07 MED ORDER — BUPIVACAINE-EPINEPHRINE (PF) 0.25% -1:200000 IJ SOLN
INTRAMUSCULAR | Status: AC
  Filled 2023-04-07: qty 30

## 2023-04-07 MED ORDER — DOCUSATE SODIUM 100 MG PO CAPS
100.0000 mg | ORAL_CAPSULE | Freq: Two times a day (BID) | ORAL | Status: DC
  Administered 2023-04-09: 100 mg via ORAL
  Filled 2023-04-07 (×3): qty 1

## 2023-04-07 SURGICAL SUPPLY — 84 items
BAG COUNTER SPONGE SURGICOUNT (BAG) ×1 IMPLANT
BAG SPNG CNTER NS LX DISP (BAG) ×1
BIT DRILL WIRE PASS 1.3MM (BIT) IMPLANT
BLADE CLIPPER SPEC (BLADE) IMPLANT
BLADE ULTRA TIP 2M (BLADE) IMPLANT
BUR 14 MATCH 3 (BUR) IMPLANT
BUR MR8 14 BALL 5 (BUR) IMPLANT
BUR PRECISION FLUTE 6.0 (BURR) ×1 IMPLANT
BUR SPIRAL ROUTER 2.3 (BUR) IMPLANT
BURR 14 MATCH 3 (BUR)
BURR MR8 14 BALL 5 (BUR)
CANISTER SUCT 3000ML PPV (MISCELLANEOUS) ×2 IMPLANT
CATH VENTRIC 35X38 W/TROCAR LG (CATHETERS) IMPLANT
CLIP TI MEDIUM 6 (CLIP) IMPLANT
CNTNR URN SCR LID CUP LEK RST (MISCELLANEOUS) ×1 IMPLANT
CONT SPEC 4OZ STRL OR WHT (MISCELLANEOUS) ×1
COVER BACK TABLE 60X90IN (DRAPES) IMPLANT
COVERAGE SUPPORT O-ARM STEALTH (MISCELLANEOUS) ×1 IMPLANT
DRAIN RELI 100 BL SUC LF ST (DRAIN)
DRAPE MICROSCOPE SLANT 54X150 (MISCELLANEOUS) ×1 IMPLANT
DRAPE NEUROLOGICAL W/INCISE (DRAPES) ×1 IMPLANT
DRAPE SHEET LG 3/4 BI-LAMINATE (DRAPES) ×4 IMPLANT
DRAPE SURG 17X23 STRL (DRAPES) IMPLANT
DRAPE WARM FLUID 44X44 (DRAPES) ×1 IMPLANT
DRILL WIRE PASS 1.3MM (BIT)
ELECT REM PT RETURN 9FT ADLT (ELECTROSURGICAL) ×1
ELECTRODE REM PT RTRN 9FT ADLT (ELECTROSURGICAL) ×1 IMPLANT
EVACUATOR 1/8 PVC DRAIN (DRAIN) IMPLANT
EVACUATOR SILICONE 100CC (DRAIN) IMPLANT
FEE COVERAGE SUPPORT O-ARM (MISCELLANEOUS) ×1 IMPLANT
FORCEPS BIPOLAR SPETZLER 8 1.0 (NEUROSURGERY SUPPLIES) IMPLANT
GAUZE 4X4 16PLY ~~LOC~~+RFID DBL (SPONGE) IMPLANT
GAUZE SPONGE 4X4 12PLY STRL (GAUZE/BANDAGES/DRESSINGS) IMPLANT
GLOVE BIO SURGEON STRL SZ 6.5 (GLOVE) ×1 IMPLANT
GLOVE BIO SURGEON STRL SZ7 (GLOVE) IMPLANT
GLOVE BIO SURGEON STRL SZ8 (GLOVE) ×2 IMPLANT
GLOVE BIO SURGEON STRL SZ8.5 (GLOVE) ×2 IMPLANT
GLOVE BIOGEL PI IND STRL 6.5 (GLOVE) ×1 IMPLANT
GLOVE EXAM NITRILE XL STR (GLOVE) IMPLANT
GOWN STRL REUS W/ TWL LRG LVL3 (GOWN DISPOSABLE) IMPLANT
GOWN STRL REUS W/ TWL XL LVL3 (GOWN DISPOSABLE) ×1 IMPLANT
GOWN STRL REUS W/TWL LRG LVL3 (GOWN DISPOSABLE) ×2
GOWN STRL REUS W/TWL XL LVL3 (GOWN DISPOSABLE) ×1
HEMOSTAT POWDER KIT SURGIFOAM (HEMOSTASIS) ×1 IMPLANT
HEMOSTAT SURGICEL 2X14 (HEMOSTASIS) ×1 IMPLANT
KIT BASIN OR (CUSTOM PROCEDURE TRAY) ×1 IMPLANT
KIT DRAIN CSF ACCUDRAIN (MISCELLANEOUS) IMPLANT
KIT TURNOVER KIT B (KITS) ×1 IMPLANT
MARKER SKIN DUAL TIP RULER LAB (MISCELLANEOUS) IMPLANT
MARKER SPHERE PSV REFLC NDI (MISCELLANEOUS) ×5 IMPLANT
NDL HYPO 22X1.5 SAFETY MO (MISCELLANEOUS) ×1 IMPLANT
NEEDLE HYPO 22X1.5 SAFETY MO (MISCELLANEOUS) ×1 IMPLANT
NS IRRIG 1000ML POUR BTL (IV SOLUTION) ×1 IMPLANT
PACK BATTERY CMF DISP FOR DVR (ORTHOPEDIC DISPOSABLE SUPPLIES) ×1 IMPLANT
PACK CRANIOTOMY CUSTOM (CUSTOM PROCEDURE TRAY) ×1 IMPLANT
PAD ARMBOARD 7.5X6 YLW CONV (MISCELLANEOUS) ×1 IMPLANT
PATTIES SURGICAL .25X.25 (GAUZE/BANDAGES/DRESSINGS) IMPLANT
PATTIES SURGICAL .5 X.5 (GAUZE/BANDAGES/DRESSINGS) IMPLANT
PATTIES SURGICAL .5 X3 (DISPOSABLE) IMPLANT
PATTIES SURGICAL 1X1 (DISPOSABLE) IMPLANT
PIN MAYFIELD SKULL DISP (PIN) ×1 IMPLANT
SOL ELECTROSURG ANTI STICK (MISCELLANEOUS) ×2
SOLUTION ELECTROSURG ANTI STCK (MISCELLANEOUS) ×2 IMPLANT
SPECIMEN JAR SMALL (MISCELLANEOUS) IMPLANT
SPONGE NEURO XRAY DETECT 1X3 (DISPOSABLE) IMPLANT
SPONGE SURGIFOAM ABS GEL 100 (HEMOSTASIS) ×1 IMPLANT
STAPLER SKIN PROX WIDE 3.9 (STAPLE) ×1 IMPLANT
STOCKINETTE 6 STRL (DRAPES) IMPLANT
SUT ETHILON 3 0 FSL (SUTURE) IMPLANT
SUT ETHILON 3 0 PS 1 (SUTURE) IMPLANT
SUT NURALON 4 0 TR CR/8 (SUTURE) ×2 IMPLANT
SUT PROLENE 6 0 BV (SUTURE) IMPLANT
SUT SILK 0 TIES 10X30 (SUTURE) IMPLANT
SUT VIC AB 2-0 CP2 18 (SUTURE) ×1 IMPLANT
SUT VIC AB 3-0 FS2 27 (SUTURE) IMPLANT
SUT VICRYL 4-0 PS2 18IN ABS (SUTURE) IMPLANT
TAPE CLOTH SURG 4X10 WHT LF (GAUZE/BANDAGES/DRESSINGS) IMPLANT
TOWEL GREEN STERILE (TOWEL DISPOSABLE) ×1 IMPLANT
TOWEL GREEN STERILE FF (TOWEL DISPOSABLE) ×1 IMPLANT
TRAY FOLEY MTR SLVR 16FR STAT (SET/KITS/TRAYS/PACK) ×1 IMPLANT
TUBE CONNECTING 12X1/4 (SUCTIONS) IMPLANT
TUBING FEATHERFLOW (TUBING) IMPLANT
UNDERPAD 30X36 HEAVY ABSORB (UNDERPADS AND DIAPERS) IMPLANT
WATER STERILE IRR 1000ML POUR (IV SOLUTION) ×1 IMPLANT

## 2023-04-07 NOTE — Anesthesia Preprocedure Evaluation (Signed)
Anesthesia Evaluation  Patient identified by MRN, date of birth, ID band Patient awake    Reviewed: Allergy & Precautions, H&P , NPO status , Patient's Chart, lab work & pertinent test results  Airway Mallampati: II   Neck ROM: full    Dental   Pulmonary Current Smoker and Patient abstained from smoking.   breath sounds clear to auscultation       Cardiovascular negative cardio ROS  Rhythm:regular Rate:Normal     Neuro/Psych  Headaches Right brain mass    GI/Hepatic ,GERD  ,,  Endo/Other    Renal/GU      Musculoskeletal  (+) Arthritis ,    Abdominal   Peds  Hematology   Anesthesia Other Findings   Reproductive/Obstetrics                             Anesthesia Physical Anesthesia Plan  ASA: 3  Anesthesia Plan: General   Post-op Pain Management:    Induction: Intravenous  PONV Risk Score and Plan: 1 and Ondansetron, Dexamethasone, Midazolam and Treatment may vary due to age or medical condition  Airway Management Planned: Oral ETT  Additional Equipment: Arterial line  Intra-op Plan:   Post-operative Plan: Extubation in OR  Informed Consent: I have reviewed the patients History and Physical, chart, labs and discussed the procedure including the risks, benefits and alternatives for the proposed anesthesia with the patient or authorized representative who has indicated his/her understanding and acceptance.     Dental advisory given  Plan Discussed with: CRNA, Anesthesiologist and Surgeon  Anesthesia Plan Comments:        Anesthesia Quick Evaluation

## 2023-04-07 NOTE — Progress Notes (Signed)
Subjective: The patient is mildly somnolent.  Objective: Vital signs in last 24 hours: Temp:  [98.3 F (36.8 C)] 98.3 F (36.8 C) (06/27 1507) Pulse Rate:  [57-66] 60 (06/27 1930) Resp:  [11-18] 12 (06/27 1927) BP: (136-161)/(75-96) 161/96 (06/27 1927) SpO2:  [98 %-99 %] 99 % (06/27 1930) Weight:  [69.4 kg] 69.4 kg (06/27 1507) Estimated body mass index is 21.34 kg/m as calculated from the following:   Height as of this encounter: 5\' 11"  (1.803 m).   Weight as of this encounter: 69.4 kg.   Intake/Output from previous day: No intake/output data recorded. Intake/Output this shift: Total I/O In: -  Out: 775 [Urine:700; Blood:75]  Physical exam the patient is mildly somnolent but arousable.  He is moving all 4 extremities well.  He follows commands.  Lab Results: No results for input(s): "WBC", "HGB", "HCT", "PLT" in the last 72 hours. BMET No results for input(s): "NA", "K", "CL", "CO2", "GLUCOSE", "BUN", "CREATININE", "CALCIUM" in the last 72 hours.  Studies/Results: DG O-ARM IMAGE ONLY/NO REPORT  Result Date: 04/07/2023 There is no Radiologist interpretation  for this exam.   Assessment/Plan: The patient is doing well.  I spoke with his wife.  LOS: 0 days     Cristi Loron 04/07/2023, 10:34 PM

## 2023-04-07 NOTE — Anesthesia Procedure Notes (Signed)
Procedure Name: Intubation Date/Time: 04/07/2023 7:41 PM  Performed by: Aundria Rud, CRNAPre-anesthesia Checklist: Patient identified, Emergency Drugs available, Suction available and Patient being monitored Patient Re-evaluated:Patient Re-evaluated prior to induction Oxygen Delivery Method: Circle System Utilized Preoxygenation: Pre-oxygenation with 100% oxygen Induction Type: IV induction Ventilation: Mask ventilation without difficulty Laryngoscope Size: Mac and 4 Grade View: Grade I Tube type: Oral Tube size: 7.5 mm Number of attempts: 1 Airway Equipment and Method: Stylet and Oral airway Placement Confirmation: ETT inserted through vocal cords under direct vision, positive ETCO2 and breath sounds checked- equal and bilateral Secured at: 21 cm Tube secured with: Tape Dental Injury: Teeth and Oropharynx as per pre-operative assessment

## 2023-04-07 NOTE — Anesthesia Procedure Notes (Signed)
Arterial Line Insertion Start/End6/27/2024 5:30 PM, 04/07/2023 5:35 PM Performed by: Aundria Rud, CRNA  Patient location: Pre-op. Preanesthetic checklist: patient identified, IV checked, site marked, risks and benefits discussed, surgical consent, monitors and equipment checked, pre-op evaluation, timeout performed and anesthesia consent Lidocaine 1% used for infiltration Left, radial was placed Catheter size: 20 G Hand hygiene performed  and maximum sterile barriers used   Attempts: 2 Procedure performed without using ultrasound guided technique. Following insertion, dressing applied and Biopatch. Post procedure assessment: normal and unchanged  Patient tolerated the procedure well with no immediate complications. Additional procedure comments: Landon SRNA placed arterial line.

## 2023-04-07 NOTE — H&P (Signed)
Subjective: The patient is a 62 year old right-handed white male long-term smoker who presented to the [facility], ER on 03/31/2023 complaining of headaches.  He was worked up with CT scan and a brain MRI which demonstrated a right cerebellar lesion.  He was worked up with CT of the chest abdomen pelvis which demonstrated a lung tumor.  I discussed the situation with the patient.  We discussed the various treatment options.  He has decided to proceed with surgery.  Past Medical History:  Diagnosis Date   Dyslipidemia    GERD (gastroesophageal reflux disease)    Lung nodule seen on imaging study 03/31/2023   seen on CT results   Osteoarthritis    Smoker    long term smoker 1 ppd    Past Surgical History:  Procedure Laterality Date   BACK SURGERY     x 1   COLONOSCOPY WITH PROPOFOL N/A 08/18/2020   Procedure: COLONOSCOPY WITH PROPOFOL;  Surgeon: Lanelle Bal, DO;  Location: [facility];  Service: Endoscopy;  Laterality: N/A;  7:30am   MULTIPLE TOOTH EXTRACTIONS     wears full dentures   POLYPECTOMY  08/18/2020   Procedure: POLYPECTOMY INTESTINAL;  Surgeon: Lanelle Bal, DO;  Location: [facility];  Service: Endoscopy;;    No Known Allergies  Social History   Tobacco Use   Smoking status: Every Day    Packs/day: 1    Types: Cigarettes   Smokeless tobacco: Never   Tobacco comments:    Cutting back and wears nicotine patch since 03/31/23.  KM  Substance Use Topics   Alcohol use: Not Currently    Comment: occas    Family History  Problem Relation Age of Onset   Diabetes Father    Asthma Father    Prior to Admission medications   Medication Sig Start Date End Date Taking? Authorizing Provider  acetaminophen-caffeine (EXCEDRIN TENSION HEADACHE) 500-65 MG TABS per tablet Take 2 tablets by mouth daily as needed (pain).   Yes [provider]  dexamethasone (DECADRON) 4 MG tablet Take 1 tablet (4 mg total) by mouth 3 (three) times daily. 04/01/23  Yes Rhetta Mura, MD  HYDROcodone-acetaminophen (NORCO) 10-325 MG tablet Take 1 tablet by mouth every 4 (four) hours as needed for moderate pain.   Yes [provider]  Naphazoline-Pheniramine (VISINE OP) Place 1 drop into both eyes daily as needed (dry eyes).   Yes [provider]  nicotine (NICODERM CQ - DOSED IN MG/24 HOURS) 21 mg/24hr patch Place 1 patch (21 mg total) onto the skin daily. 04/02/23  Yes Rhetta Mura, MD  pantoprazole (PROTONIX) 40 MG tablet Take 1 tablet (40 mg total) by mouth 2 (two) times daily. 04/01/23  Yes Rhetta Mura, MD  tamsulosin (FLOMAX) 0.4 MG CAPS capsule Take 0.4 mg by mouth at bedtime as needed (urinary flow issues). 03/01/23  Yes [provider]  traMADol (ULTRAM) 50 MG tablet Take 50-100 mg by mouth 4 (four) times daily as needed for moderate pain. 07/11/20  Yes [provider]  zolpidem (AMBIEN) 10 MG tablet Take 10 mg by mouth at bedtime as needed for sleep. 12/22/22  Yes [provider]     Review of Systems  Positive ROS: As above  All other systems have been reviewed and were otherwise negative with the exception of those mentioned in the HPI and as above.  Objective: Vital signs in last 24 hours: Temp:  [98.3 F (36.8 C)] 98.3 F (36.8 C) (06/27 1507) Pulse  Rate:  [66] 66 (06/27 1507) Resp:  [18] 18 (06/27 1507) BP: (136)/(75) 136/75 (06/27 1507) SpO2:  [98 %] 98 % (06/27 1507) Weight:  [69.4 kg] 69.4 kg (06/27 1507) Estimated body mass index is 21.34 kg/m as calculated from the following:   Height as of this encounter: 5\' 11"  (1.803 m).   Weight as of this encounter: 69.4 kg.   General Appearance: Alert Head: Normocephalic, without obvious abnormality, atraumatic Eyes: PERRL, conjunctiva/corneas clear, EOM's intact,    Ears: Normal  Throat: Normal  Neck: Supple, Back: unremarkable Lungs: Clear to auscultation bilaterally, respirations unlabored Heart: Regular rate and rhythm, no  murmur, rub or gallop Abdomen: Soft, non-tender Extremities: Extremities normal, atraumatic, no cyanosis or edema Skin: unremarkable  NEUROLOGIC:   Mental status: alert and oriented,Motor Exam - grossly normal Sensory Exam - grossly normal Reflexes:  Coordination -has some right-sided incoordination/dysmetria gait -unsteady Balance - grossly normal Cranial Nerves: I: smell Not tested  II: visual acuity  OS: Normal  OD: Normal   II: visual fields Full to confrontation  II: pupils Equal, round, reactive to light  III,VII: ptosis None  III,IV,VI: extraocular muscles  Full ROM  V: mastication Normal  V: facial light touch sensation  Normal  V,VII: corneal reflex  Present  VII: facial muscle function - upper  Normal  VII: facial muscle function - lower Normal  VIII: hearing Not tested  IX: soft palate elevation  Normal  IX,X: gag reflex Present  XI: trapezius strength  5/5  XI: sternocleidomastoid strength 5/5  XI: neck flexion strength  5/5  XII: tongue strength  Normal    Data Review Lab Results  Component Value Date   WBC 6.8 04/01/2023   HGB 13.6 04/01/2023   HCT 42.4 04/01/2023   MCV 88.7 04/01/2023   PLT 202 04/01/2023   Lab Results  Component Value Date   NA 139 04/01/2023   K 4.0 04/01/2023   CL 106 04/01/2023   CO2 22 04/01/2023   BUN 14 04/01/2023   CREATININE 0.90 04/01/2023   GLUCOSE 135 (H) 04/01/2023   No results found for: "INR", "PROTIME"  Assessment/Plan: Right cerebellar lesion: I have discussed the situation with the patient.  We discussed the various treatment options including surgery.  I described the surgical treatment option of a right suboccipital craniectomy for resection of the cerebellar lesion.  I have described that surgery to him.  We have discussed the risk, benefits, alternatives, expected postoperative course, and likelihood of achieving our goals with surgery.  I have answered all his questions.  He has decided to proceed with  surgery.   Cristi Loron 04/07/2023 3:08 PM

## 2023-04-07 NOTE — Transfer of Care (Signed)
Immediate Anesthesia Transfer of Care Note  Patient: Matthew Rivera  Procedure(s) Performed: Right suboccipital craniotomy for tumor resection (Right) Application of O-Arm (Right)  Patient Location: PACU  Anesthesia Type:General  Level of Consciousness: awake, alert , and patient cooperative  Airway & Oxygen Therapy: Patient Spontanous Breathing  Post-op Assessment: Report given to RN, Post -op Vital signs reviewed and stable, and Patient moving all extremities X 4  Post vital signs: Reviewed and stable  Last Vitals:  Vitals Value Taken Time  BP 140/84 04/07/23 2245  Temp    Pulse 75 04/07/23 2256  Resp 15 04/07/23 2256  SpO2 97 % 04/07/23 2256  Vitals shown include unvalidated device data.  Last Pain:  Vitals:   04/07/23 1516  TempSrc:   PainSc: 0-No pain      Patients Stated Pain Goal: 0 (04/07/23 1516)  Complications: No notable events documented.

## 2023-04-07 NOTE — Op Note (Signed)
Brief history: The patient is a 62 year old white male who presented to the ER with headaches.  He was worked up with a head CT and a brain MRI which demonstrated a right cerebellar tumor.  He went on to have a CT of the chest abdomen pelvis which demonstrated a lung tumor.  I discussed the various treatment options with him.  He has decided proceed with surgery.  Preop diagnosis: Right cerebellar tumor  Postop diagnosis: The same  Procedure: Right suboccipital craniectomy for gross total resection of cerebellar tumor using microdissection and with application of Stealth stereotactic navigation  Surgeon: Dr. Delma Officer  Assistant: None  Anesthesia: General tracheal  Estimated blood loss: 125 cc  Specimens: Tumor  Drains: None  Complications: None  Description of procedure: The patient was brought to the operating room by the anesthesia team.  General endotracheal anesthesia was induced.  I applied the Mayfield 3 point headrest to the patient's calvarium.  The patient was turned to the prone position on the chest rolls.  We then ran an O-arm scan.  The O-arm CT scan was merged with the patient's preoperative MRI scan.  The patient's suboccipital region was then shaved with clippers and prepared with Betadine scrub and Betadine solution.  Sterile drapes were applied.  I injected the area to be incised with Marcaine with epinephrine solution.  I made a linear incision  in the patient's right suboccipital region.  I used the cerebellar retractor for exposure.  We used electrocautery to expose the underlying calvarium.  I confirmed the location of the tumor using the Stealth neuronavigation.  I then used a high-speed drill to create a right suboccipital craniectomy.  I did not encounter any mastoid air cells.  I then incised the underlying dura with a 15 blade scalpel and the Metzenbaum scissors.  We then brought the operative microscope microscope into the field and under its magnification  and illumination we completed the microdissection/decompression.  I then used bipolar cautery to create a small corticotomy I dissected deeper with suction and bipolar cautery and we encountered a tumor.  The tumor was fairly well-circumscribed from the surrounding cerebellum.  I dissected around the tumor using bipolar cautery and suction circumscribing the tumor.  The tumor was a bit adherent to the dura but there was no true dural based per se.  It appeared to be a metastasis.  I removed the tumor and appeared to get a gross total resection.  I then obtained hemostasis in the tumor resection bed using bipolar cautery and Gelfoam.  I irrigated the resection cavity out with saline solution.  It came back crystal-clear  I then lined the resection cavity with Surgicel.  I then reapproximated the patient's dura the best I could with Nurolon sutures.  I laid Gelfoam over the any ectomy site.  We then removed the retractors.  I then reapproximated the patient's galea with interrupted 2-0 Vicryl suture.  I reapproximated the skin with interrupted stainless steel staples.  The wound was then coated with bacitracin ointment.  A sterile dressing was applied.  The drapes were removed.  The patient was then returned to the supine position.  I then removed the Mayfield 3 point headrest from his calvarium.  By report all sponge, instrument, and needle counts were correct at the end of this case.

## 2023-04-08 ENCOUNTER — Other Ambulatory Visit: Payer: Self-pay

## 2023-04-08 ENCOUNTER — Encounter (HOSPITAL_COMMUNITY): Payer: Self-pay | Admitting: Neurosurgery

## 2023-04-08 LAB — MRSA NEXT GEN BY PCR, NASAL: MRSA by PCR Next Gen: NOT DETECTED

## 2023-04-08 NOTE — Progress Notes (Signed)
eLink Physician-Brief Progress Note Patient Name: Matthew Rivera DOB: [birth year] MRN: [id number]   Date of Service  04/08/2023  HPI/Events of Note  62 yr old male smoker presented with complaint of headache found to have large cerebellar mass and lung mass.  Patient now s/p suboccipital craniectomy for resection of cerebellar lesion.  Now admitted to ICU post-op.    eICU Interventions  Chart reviewed     Intervention Category Evaluation Type: New Patient Evaluation  Henry Russel, P 04/08/2023, 12:43 AM

## 2023-04-08 NOTE — Plan of Care (Signed)
  Problem: Education: Goal: Knowledge of the prescribed therapeutic regimen will improve Outcome: Progressing   Problem: Clinical Measurements: Goal: Usual level of consciousness will be regained or maintained. Outcome: Progressing Goal: Neurologic status will improve Outcome: Progressing Goal: Ability to maintain intracranial pressure will improve Outcome: Progressing   Problem: Skin Integrity: Goal: Demonstration of wound healing without infection will improve Outcome: Progressing   

## 2023-04-08 NOTE — Progress Notes (Signed)
Subjective: The patient is alert and pleasant.  He had an episode of nausea and vomiting this morning.  Objective: Vital signs in last 24 hours: Temp:  [97.9 F (36.6 C)-98.3 F (36.8 C)] 97.9 F (36.6 C) (06/28 0400) Pulse Rate:  [57-75] 68 (06/28 0600) Resp:  [11-21] 17 (06/28 0600) BP: (112-161)/(68-116) 126/69 (06/28 0600) SpO2:  [97 %-99 %] 98 % (06/28 0600) Arterial Line BP: (120-165)/(49-70) 149/60 (06/28 0600) Weight:  [69.4 kg] 69.4 kg (06/27 1507) Estimated body mass index is 21.34 kg/m as calculated from the following:   Height as of this encounter: 5\' 11"  (1.803 m).   Weight as of this encounter: 69.4 kg.   Intake/Output from previous day: 06/27 0701 - 06/28 0700 In: 2454.3 [I.V.:2354.2; IV Piggyback:100.2] Out: 1680 [Urine:1505; Emesis/NG output:100; Blood:75] Intake/Output this shift: Total I/O In: 2454.3 [I.V.:2354.2; IV Piggyback:100.2] Out: 1680 [Urine:1505; Emesis/NG output:100; Blood:75]  Physical exam the patient is alert and oriented.  He looks well.  His dressing is clean and dry.  His strength is normal.  Lab Results: No results for input(s): "WBC", "HGB", "HCT", "PLT" in the last 72 hours. BMET No results for input(s): "NA", "K", "CL", "CO2", "GLUCOSE", "BUN", "CREATININE", "CALCIUM" in the last 72 hours.  Studies/Results: DG O-ARM IMAGE ONLY/NO REPORT  Result Date: 04/07/2023 There is no Radiologist interpretation  for this exam.   Assessment/Plan: Postop day #1: The patient seems to be doing well.  He can go home once his nausea and vomiting passes.  I have answered all his questions.  LOS: 1 day     Cristi Loron 04/08/2023, 6:52 AM     Patient ID: Matthew Rivera, male   DOB: July 09, 1961, 62 y.o.   MRN: [id number]

## 2023-04-09 ENCOUNTER — Other Ambulatory Visit (HOSPITAL_COMMUNITY): Payer: Self-pay

## 2023-04-09 MED ORDER — DEXAMETHASONE 4 MG PO TABS
4.0000 mg | ORAL_TABLET | Freq: Three times a day (TID) | ORAL | 0 refills | Status: DC
  Filled 2023-04-09: qty 30, 10d supply, fill #0

## 2023-04-09 NOTE — Discharge Summary (Signed)
Physician Discharge Summary  Patient ID: Matthew Rivera MRN: [id number] DOB/AGE: Jul 05, 1961 62 y.o.  Admit date: 04/07/2023 Discharge date: 04/09/2023  Admission Diagnoses: Right cerebellar tumor     Discharge Diagnoses: same   Discharged Condition: good  Hospital Course: The patient was admitted on 04/07/2023 and taken to the operating room where the patient underwent right suboccipital crani for resection of tumor. The patient tolerated the procedure well and was taken to the recovery room and then to the ICU in stable condition. The hospital course was routine. There were no complications. The wound remained clean dry and intact. Pt had appropriate head soreness. No complaints of new pain or new N/T/W. The patient remained afebrile with stable vital signs, and tolerated a regular diet. The patient continued to increase activities, and pain was well controlled with oral pain medications.   Consults: None  Significant Diagnostic Studies:  Results for orders placed or performed during the hospital encounter of 04/07/23  MRSA Next Gen by PCR, Nasal   Specimen: Nasal Mucosa; Nasal Swab  Result Value Ref Range   MRSA by PCR Next Gen NOT DETECTED NOT DETECTED  Glucose, capillary  Result Value Ref Range   Glucose-Capillary 117 (H) 70 - 99 mg/dL  Type and screen  Result Value Ref Range   ABO/RH(D) O POS    Antibody Screen NEG    Sample Expiration      04/10/2023,2359 Performed at [facility], [address], [place], [place] [address]   ABO/Rh  Result Value Ref Range   ABO/RH(D)      O POS Performed at [facility], [address] [place], [place] [address]     DG O-ARM IMAGE ONLY/NO REPORT  Result Date: 04/07/2023 There is no Radiologist interpretation  for this exam.  CT CHEST ABDOMEN PELVIS W CONTRAST  Result Date: 03/31/2023 CLINICAL DATA:  Brain lesions on recent MRI. Evaluate for possible primary tumor. * Tracking Code: BO * EXAM: CT CHEST, ABDOMEN, AND  PELVIS WITH CONTRAST TECHNIQUE: Multidetector CT imaging of the chest, abdomen and pelvis was performed following the standard protocol during bolus administration of intravenous contrast. RADIATION DOSE REDUCTION: This exam was performed according to the departmental dose-optimization program which includes automated exposure control, adjustment of the mA and/or kV according to patient size and/or use of iterative reconstruction technique. CONTRAST:  OMNIPAQUE IOHEXOL 300 MG/ML  SOLN COMPARISON:  None Available. FINDINGS: CT CHEST FINDINGS Cardiovascular: The heart is normal in size. No pericardial effusion. The aorta is normal in caliber. No dissection. Scattered coronary artery calcifications. Mediastinum/Nodes: Necrotic nodal mass in the subcarinal station measuring a maximum of 4.0 x 2.5 cm. There is also a mildly necrotic left hilar node measuring 13 mm on image 36/3. The esophagus is grossly normal. Lungs/Pleura: 5 cm spiculated central left lobe lung mass consistent with primary lung neoplasm. Subpleural nodular density in the right lower lobe posteriorly could be a metastatic lesion or an area of rounded atelectasis. Other small patchy subpleural densities are noted in the superior segment of the right lower lobe and also in the left lower lobe. Indeterminate findings requiring surveillance. Underlying emphysematous changes pulmonary scarring. Musculoskeletal: No significant bony findings. CT ABDOMEN PELVIS FINDINGS Hepatobiliary: None no hepatic lesions are identified. No intrahepatic biliary dilatation. The gallbladder is unremarkable. Normal caliber common bile duct. Pancreas: Normal Spleen: Splenomegaly. The spleen measures 14.513.510.0 cm. Small calcified granulomas. No splenic lesions. Adrenals/Urinary Tract: The adrenal glands and kidneys are. No worrisome renal or  adrenal gland lesions. The bladder is unremarkable. Stomach/Bowel: No significant findings. Vascular/Lymphatic: Age advanced  atherosclerotic calcifications involving the aorta and iliac arteries but no aneurysm or dissection. The major venous structures are patent. No mesenteric or retroperitoneal mass or adenopathy. Reproductive: The prostate gland and seminal vesicles are. There is a large left hemiscrotum hydrocele noted. Other: No pelvic mass or adenopathy. No free pelvic fluid collections. No inguinal mass or adenopathy. No abdominal wall hernia or subcutaneous lesions. Musculoskeletal: No significant bony findings. No lytic or sclerotic bone lesions. IMPRESSION: 1. 5 cm spiculated central left lobe lung mass consistent with primary lung neoplasm. 2. Necrotic nodal mass in the subcarinal station and left hilar region consistent with metastatic disease. 3. Subpleural nodular density in the right lower lobe posteriorly could be a metastatic lesion or an area of rounded atelectasis. Other small patchy subpleural densities are noted in the superior segment of the right lower lobe and also in the left lower lobe. Indeterminate findings requiring surveillance. 4. No findings for metastatic disease involving the abdomen/pelvis or bony structures. 5. Splenomegaly of uncertain etiology. 6. Age advanced atherosclerotic calcifications involving the aorta and iliac arteries. 7. Large left hemiscrotum hydrocele. 8. Aortic atherosclerosis. Aortic Atherosclerosis (ICD10-I70.0). Electronically Signed   By: Rudie Meyer M.D.   On: 03/31/2023 12:28   MR Brain W and Wo Contrast  Addendum Date: 03/31/2023   ADDENDUM REPORT: 03/31/2023 12:04 ADDENDUM: Dural-based metastasis is a differential consideration. Electronically Signed   By: Orvan Falconer M.D.   On: 03/31/2023 12:04   Result Date: 03/31/2023 CLINICAL DATA:  Headache, new onset (Age >= 51y) mass on CT. EXAM: MRI HEAD WITHOUT AND WITH CONTRAST TECHNIQUE: Multiplanar, multiecho pulse sequences of the brain and surrounding structures were obtained without and with intravenous contrast.  CONTRAST:  7mL GADAVIST GADOBUTROL 1 MMOL/ML IV SOLN COMPARISON:  Head CT 03/31/2023. FINDINGS: Brain: 23 x 23 mm dural-based mass along the posterior aspect of the right petrous temporal bone with associated dural tail (image 14 series 19), favored to represent a meningioma. Extensive vasogenic edema throughout the right cerebellar hemisphere with partial effacement of the fourth ventricle and mass effect on brainstem. No acute infarct or hemorrhage. Mild chronic small-vessel disease. Lateral and third ventricles are nondilated, though there is confluence T2 hyperintensity along occipital horns, which could represent developing hydrocephalus. Vascular: Normal flow voids and vessel enhancement. Skull and upper cervical spine: Normal marrow signal and enhancement. Sinuses/Orbits: Unremarkable. Other: None. IMPRESSION: 1. 23 mm dural-based mass along the posterior aspect of the right petrous temporal bone with associated dural tail, favored to represent a meningioma. Extensive vasogenic edema throughout the right cerebellar hemisphere with partial effacement of the fourth ventricle and mass effect on brainstem. 2. Lateral and third ventricles are nondilated, though there is confluence T2 hyperintensity along occipital horns, which could represent developing hydrocephalus. Electronically Signed: By: Orvan Falconer M.D. On: 03/31/2023 11:55   CT HEAD WO CONTRAST  Result Date: 03/31/2023 CLINICAL DATA:  Provided history: Headache, new onset. Additional history provided: Headache for one week. Nausea/vomiting. Dizziness. EXAM: CT HEAD WITHOUT CONTRAST TECHNIQUE: Contiguous axial images were obtained from the base of the skull through the vertex without intravenous contrast. RADIATION DOSE REDUCTION: This exam was performed according to the departmental dose-optimization program which includes automated exposure control, adjustment of the mA and/or kV according to patient size and/or use of iterative reconstruction  technique. COMPARISON:  No pertinent prior exams available for comparison. FINDINGS: Brain: No age advanced or lobar predominant parenchymal atrophy.  2.2 x 1.6 cm hyperdense mass within or along the inferolateral right cerebellar hemisphere (for instance as seen on series 4, image 58). Prominent edema within the right cerebellar hemisphere. Associated mass effect with partial effacement of the fourth ventricle. No evidence of obstructive hydrocephalus. Mild-to-moderate patchy and ill-defined hypoattenuation within the cerebral white matter, nonspecific but most often secondary to chronic small vessel ischemia. No demarcated cortical infarct. No extra-axial fluid collection. No supratentorial midline shift. Vascular: No hyperdense vessel.  Atherosclerotic calcifications. Skull: No fracture or aggressive osseous lesion. Sinuses/Orbits: Chronic, medially displaced fracture deformity of the left lamina papyracea. No mass or acute finding within the imaged orbits. Mild mucosal thickening scattered within the paranasal sinuses at the imaged levels. Impression #1 called by telephone at the time of interpretation on 03/31/2023 at 10:14 am to provider Dr. Cathren Laine, who verbally acknowledged these results. IMPRESSION: 1. 2.2 x 1.6 cm mass within or along the inferolateral right cerebellar hemisphere. Prominent surrounding edema within the right cerebellar hemisphere. Associated mass effect with partial effacement of the fourth ventricle. No evidence of obstructive hydrocephalus. A brain MRI (with and without contrast) is recommended for further evaluation. 2. Mild-to-moderate cerebral white matter disease, nonspecific but most often secondary to chronic small vessel ischemia. Electronically Signed   By: Jackey Loge D.O.   On: 03/31/2023 10:16    Antibiotics:  Anti-infectives (From admission, onward)    Start     Dose/Rate Route Frequency Ordered Stop   04/08/23 0600  ceFAZolin (ANCEF) IVPB 2g/100 mL premix  Status:   Discontinued        2 g 200 mL/hr over 30 Minutes Intravenous On call to O.R. 04/07/23 1457 04/07/23 1505   04/08/23 0000  ceFAZolin (ANCEF) IVPB 2g/100 mL premix        2 g 200 mL/hr over 30 Minutes Intravenous Every 8 hours 04/07/23 2346 04/08/23 0853   04/07/23 1515  ceFAZolin (ANCEF) IVPB 2g/100 mL premix        2 g 200 mL/hr over 30 Minutes Intravenous On call to O.R. 04/07/23 1505 04/07/23 1945       Discharge Exam: Blood pressure 137/86, pulse 64, temperature 98.1 F (36.7 C), temperature source Oral, resp. rate 14, height 5\' 11"  (1.803 m), weight 69.4 kg, SpO2 97 %. Neurologic: Grossly normal Ambulating and voiding well incision cdi   Discharge Medications:   Allergies as of 04/09/2023   No Known Allergies      Medication List     TAKE these medications    dexamethasone 4 MG tablet Commonly known as: DECADRON Take 1 tablet (4 mg total) by mouth 3 (three) times daily.   Excedrin Tension Headache 500-65 MG Tabs per tablet Generic drug: acetaminophen-caffeine Take 2 tablets by mouth daily as needed (pain).   HYDROcodone-acetaminophen 10-325 MG tablet Commonly known as: NORCO Take 1 tablet by mouth every 4 (four) hours as needed for moderate pain.   nicotine 21 mg/24hr patch Commonly known as: NICODERM CQ - dosed in mg/24 hours Place 1 patch (21 mg total) onto the skin daily.   pantoprazole 40 MG tablet Commonly known as: PROTONIX Take 1 tablet (40 mg total) by mouth 2 (two) times daily.   tamsulosin 0.4 MG Caps capsule Commonly known as: FLOMAX Take 0.4 mg by mouth at bedtime as needed (urinary flow issues).   traMADol 50 MG tablet Commonly known as: ULTRAM Take 50-100 mg by mouth 4 (four) times daily as needed for moderate pain.   VISINE OP Place 1 drop  into both eyes daily as needed (dry eyes).   zolpidem 10 MG tablet Commonly known as: AMBIEN Take 10 mg by mouth at bedtime as needed for sleep.        Disposition: home   Final Dx: left  suboccipital crani for resection of tumor  Discharge Instructions     Call MD for:   Complete by: As directed    Call MD for:  difficulty breathing, headache or visual disturbances   Complete by: As directed    Call MD for:  hives   Complete by: As directed    Call MD for:  persistant nausea and vomiting   Complete by: As directed    Call MD for:  redness, tenderness, or signs of infection (pain, swelling, redness, odor or green/yellow discharge around incision site)   Complete by: As directed    Call MD for:  severe uncontrolled pain   Complete by: As directed    Call MD for:  temperature >100.4   Complete by: As directed    Diet - low sodium heart healthy   Complete by: As directed    Driving Restrictions   Complete by: As directed    No driving for 2 weeks, no riding in the car for 1 week   Increase activity slowly   Complete by: As directed    No wound care   Complete by: As directed           Signed: Tiana Loft Davis Vannatter 04/09/2023, 9:41 AM

## 2023-04-12 LAB — SURGICAL PATHOLOGY

## 2023-04-13 NOTE — Progress Notes (Signed)
Request for tissue be sent to [facility] for molecular and PD-L1 testing emailed to pathology technician Lita Mains.

## 2023-04-19 NOTE — Progress Notes (Signed)
NN spoke to pt and his wife to let him know that Dr.Mohamed would like to see him on Wednesday 7/10 regarding his lung cancer that has spread to his brain. Pt and his wife verbalized understanding. Appt booked for 710 at 1pm for labs and 1:30 with Dr.Mohamed. Pt and wife aware of location of cancer center and have a means of getting to the appt. All questions answered. NN contact information provided.

## 2023-04-19 NOTE — Anesthesia Postprocedure Evaluation (Signed)
Anesthesia Post Note  Patient: Matthew Rivera  Procedure(s) Performed: Right suboccipital craniotomy for tumor resection (Right) Application of O-Arm (Right)     Patient location during evaluation: PACU Anesthesia Type: General Level of consciousness: awake and alert Pain management: pain level controlled Vital Signs Assessment: post-procedure vital signs reviewed and stable Respiratory status: spontaneous breathing, nonlabored ventilation, respiratory function stable and patient connected to nasal cannula oxygen Cardiovascular status: blood pressure returned to baseline and stable Postop Assessment: no apparent nausea or vomiting Anesthetic complications: no   No notable events documented.  Last Vitals:  Vitals:   04/09/23 0900 04/09/23 1000  BP: 116/76 129/76  Pulse: (!) 58 60  Resp: 13 15  Temp:    SpO2: 97% 98%    Last Pain:  Vitals:   04/09/23 0800  TempSrc: Oral  PainSc:                  [name]

## 2023-04-20 ENCOUNTER — Inpatient Hospital Stay: Payer: 59

## 2023-04-20 ENCOUNTER — Inpatient Hospital Stay: Payer: 59 | Attending: Internal Medicine | Admitting: Internal Medicine

## 2023-04-20 ENCOUNTER — Other Ambulatory Visit: Payer: Self-pay | Admitting: *Deleted

## 2023-04-20 VITALS — BP 110/72 | HR 65 | Temp 98.2°F | Resp 16 | Ht 70.0 in | Wt 149.6 lb

## 2023-04-20 DIAGNOSIS — C7931 Secondary malignant neoplasm of brain: Secondary | ICD-10-CM | POA: Diagnosis present

## 2023-04-20 DIAGNOSIS — C3402 Malignant neoplasm of left main bronchus: Secondary | ICD-10-CM

## 2023-04-20 DIAGNOSIS — G9389 Other specified disorders of brain: Secondary | ICD-10-CM

## 2023-04-20 DIAGNOSIS — C3490 Malignant neoplasm of unspecified part of unspecified bronchus or lung: Secondary | ICD-10-CM | POA: Insufficient documentation

## 2023-04-20 DIAGNOSIS — C771 Secondary and unspecified malignant neoplasm of intrathoracic lymph nodes: Secondary | ICD-10-CM | POA: Diagnosis present

## 2023-04-20 DIAGNOSIS — F17210 Nicotine dependence, cigarettes, uncomplicated: Secondary | ICD-10-CM

## 2023-04-20 DIAGNOSIS — Z79899 Other long term (current) drug therapy: Secondary | ICD-10-CM | POA: Insufficient documentation

## 2023-04-20 DIAGNOSIS — C3492 Malignant neoplasm of unspecified part of left bronchus or lung: Secondary | ICD-10-CM | POA: Insufficient documentation

## 2023-04-20 DIAGNOSIS — Z7952 Long term (current) use of systemic steroids: Secondary | ICD-10-CM | POA: Insufficient documentation

## 2023-04-20 LAB — CBC WITH DIFFERENTIAL (CANCER CENTER ONLY)
Abs Immature Granulocytes: 0.16 10*3/uL — ABNORMAL HIGH (ref 0.00–0.07)
Basophils Absolute: 0 10*3/uL (ref 0.0–0.1)
Basophils Relative: 0 %
Eosinophils Absolute: 0.1 10*3/uL (ref 0.0–0.5)
Eosinophils Relative: 1 %
HCT: 44.1 % (ref 39.0–52.0)
Hemoglobin: 14.3 g/dL (ref 13.0–17.0)
Immature Granulocytes: 2 %
Lymphocytes Relative: 4 %
Lymphs Abs: 0.4 10*3/uL — ABNORMAL LOW (ref 0.7–4.0)
MCH: 30.2 pg (ref 26.0–34.0)
MCHC: 32.4 g/dL (ref 30.0–36.0)
MCV: 93 fL (ref 80.0–100.0)
Monocytes Absolute: 0.5 10*3/uL (ref 0.1–1.0)
Monocytes Relative: 6 %
Neutro Abs: 8.5 10*3/uL — ABNORMAL HIGH (ref 1.7–7.7)
Neutrophils Relative %: 87 %
Platelet Count: 94 10*3/uL — ABNORMAL LOW (ref 150–400)
RBC: 4.74 MIL/uL (ref 4.22–5.81)
RDW: 15.9 % — ABNORMAL HIGH (ref 11.5–15.5)
WBC Count: 9.7 10*3/uL (ref 4.0–10.5)
nRBC: 0 % (ref 0.0–0.2)

## 2023-04-20 LAB — CMP (CANCER CENTER ONLY)
ALT: 45 U/L — ABNORMAL HIGH (ref 0–44)
AST: 15 U/L (ref 15–41)
Albumin: 3.2 g/dL — ABNORMAL LOW (ref 3.5–5.0)
Alkaline Phosphatase: 56 U/L (ref 38–126)
Anion gap: 4 — ABNORMAL LOW (ref 5–15)
BUN: 17 mg/dL (ref 8–23)
CO2: 31 mmol/L (ref 22–32)
Calcium: 8.5 mg/dL — ABNORMAL LOW (ref 8.9–10.3)
Chloride: 101 mmol/L (ref 98–111)
Creatinine: 0.9 mg/dL (ref 0.61–1.24)
GFR, Estimated: >60 mL/min (ref >60)
Glucose, Bld: 118 mg/dL — ABNORMAL HIGH (ref 70–99)
Potassium: 4 mmol/L (ref 3.5–5.1)
Sodium: 136 mmol/L (ref 135–145)
Total Bilirubin: 1 mg/dL (ref 0.3–1.2)
Total Protein: 5.8 g/dL — ABNORMAL LOW (ref 6.5–8.1)

## 2023-04-20 MED ORDER — DEXAMETHASONE 4 MG PO TABS: ORAL_TABLET | ORAL | 0 refills | Status: DC

## 2023-04-20 NOTE — Progress Notes (Signed)
NN met pt today face to face at his oncology consult with Dr.Mohamed. Pt is accompanied by his wife, [name].  At this time, the plan is for the pt to have a PET scan done and a referral to radiation oncology. Molecular studies still in progress at [facility]. Once results are available and PET scan has been completed, f/u appt with Dr.Mohamed can be made.  Lung Cancer Journey Book provided to pt and his wife. Information about oncology team, information about lung cancer, and community resources in journey book. All questions answered to pts and wifes satisfaction. NN card provided to pt for pt to call with any questions.

## 2023-04-20 NOTE — Progress Notes (Signed)
[facility] [phone]) [phone]   [phone]  CONSULT NOTE  REFERRING PHYSICIAN: Dr. Peggye Ley  REASON FOR CONSULTATION:  62 years old white male with metastatic lung cancer  HPI Matthew Rivera is a 62 y.o. male with past medical history significant for GERD, osteoarthritis, dyslipidemia and long history of smoking.  The patient presented to the emergency department at [facility] on 03/31/2023 complaining of headaches associated with dizziness as well as nausea and vomiting.  This has been going on for 7-10 days before his presentation and it was getting worse.  He has been taking over-the-counter medication with no improvement.  At the emergency department he had CT scan of the head without contrast that showed 2.2 x 1.6 cm mass within or along the inferior lateral right cerebellar hemisphere with prominent surrounding edema within the right cerebellar hemisphere and associated mass effect with partial effacement of the fourth ventricle.  There was no evidence of obstructive hydrocephalus and brain MRI was recommended which was done on the same day and that showed 2.3 cm dural based mass along the posterior aspect of the right petrous temporal bone with associated dural tail favored to represent a meningioma but there was extensive vasogenic edema throughout the right cerebellar hemisphere with partial effacement of the fourth ventricle and mass effect on brainstem.  The dural based metastasis was still a consideration.  The patient also had staging CT scan of the chest, abdomen and pelvis performed on 03/31/2023 and that showed 5.0 cm spiculated central left upper lobe mass consistent with primary lung neoplasm.  There was necrotic nodal mass in the subcarinal station and left hilar region consistent with metastatic disease.  There was also subpleural nodular density in the right lower lobe posteriorly that could be a metastatic lesion or an area of rounded  atelectasis.  Other small patchy subpleural densities are noted in the superior segment of the right lower lobe and also in the left lower lobe that are indeterminate and require surveillance.  There was no findings of metastatic disease involving the abdomen/pelvis or bony structures. On 04/07/2023 the patient underwent right suboccipital craniotomy for gross total resection of the cerebellar tumor using microdissection under the care of [name].  The final pathology [id number]) showed metastatic poorly differentiated adenocarcinoma consistent with a lung primary. Immunohistochemical stains show that the metastatic carcinoma is  positive for CK7 and TTF-1 while it is negative for CK20, p63 and CK5/6, consistent with above interpretation.  His tissue [facility] for molecular studies and PD-L1 expression.  PD-L1 expression was 80%. The patient was referred to me today for evaluation and recommendation regarding treatment of his condition. When seen to date the patient continues to complain of fatigue and shortness of breath with exertion as well as weakness in his left leg.  He denied having any headache or visual changes.  He denied having any chest pain, cough or hemoptysis.  He has no nausea, vomiting, diarrhea or constipation.  He has no weight loss or night sweats. Family history significant for mother with hypertension.  Father with diabetes.  Maternal grandmother with breast cancer and paternal grandfather with cancer. The patient is married and has 1 biologic son and several stepchildren.  He was accompanied today by his wife [name].  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  He has a history for smoking around 1 pack/day for around 50 years and still  smoking and trying to quit.  He has no history of alcohol or drug abuse.  HPI  Past Medical History:  Diagnosis Date   Dyslipidemia    GERD (gastroesophageal reflux disease)    Lung  nodule seen on imaging study 03/31/2023   seen on CT results   Osteoarthritis    Smoker    long term smoker 1 ppd    Past Surgical History:  Procedure Laterality Date   BACK SURGERY     x 1   COLONOSCOPY WITH PROPOFOL N/A 08/18/2020   Procedure: COLONOSCOPY WITH PROPOFOL;  Surgeon: Lanelle Bal, DO;  Location: [facility];  Service: Endoscopy;  Laterality: N/A;  7:30am   CRANIOTOMY Right 04/07/2023   Procedure: Right suboccipital craniotomy for tumor resection;  Surgeon: Tressie Stalker, MD;  Location[facility] OR;  Service: Neurosurgery;  Laterality: Right;  TO follow   MULTIPLE TOOTH EXTRACTIONS     wears full dentures   POLYPECTOMY  08/18/2020   Procedure: POLYPECTOMY INTESTINAL;  Surgeon: Lanelle Bal, DO;  Location: [facility];  Service: Endoscopy;;    Family History  Problem Relation Age of Onset   Diabetes Father    Asthma Father     Social History Social History   Tobacco Use   Smoking status: Every Day    Packs/day: 1    Types: Cigarettes   Smokeless tobacco: Never   Tobacco comments:    Cutting back and wears nicotine patch since 03/31/23.  KM  Vaping Use   Vaping Use: Never used  Substance Use Topics   Alcohol use: Not Currently    Comment: occas   Drug use: Never    No Known Allergies  Current Outpatient Medications  Medication Sig Dispense Refill   acetaminophen-caffeine (EXCEDRIN TENSION HEADACHE) 500-65 MG TABS per tablet Take 2 tablets by mouth daily as needed (pain).     dexamethasone (DECADRON) 4 MG tablet Take 1 tablet (4 mg total) by mouth 3 (three) times daily. 30 tablet 0   HYDROcodone-acetaminophen (NORCO) 10-325 MG tablet Take 1 tablet by mouth every 4 (four) hours as needed for moderate pain.     Naphazoline-Pheniramine (VISINE OP) Place 1 drop into both eyes daily as needed (dry eyes).     nicotine (NICODERM CQ - DOSED IN MG/24 HOURS) 21 mg/24hr patch Place 1 patch (21 mg total) onto the skin daily. 28 patch 0   pantoprazole  (PROTONIX) 40 MG tablet Take 1 tablet (40 mg total) by mouth 2 (two) times daily. 60 tablet 2   tamsulosin (FLOMAX) 0.4 MG CAPS capsule Take 0.4 mg by mouth at bedtime as needed (urinary flow issues).     traMADol (ULTRAM) 50 MG tablet Take 50-100 mg by mouth 4 (four) times daily as needed for moderate pain.     zolpidem (AMBIEN) 10 MG tablet Take 10 mg by mouth at bedtime as needed for sleep.     No current facility-administered medications for this visit.    Review of Systems  Constitutional: positive for fatigue Eyes: negative Ears, nose, mouth, throat, and face: negative Respiratory: positive for dyspnea on exertion Cardiovascular: negative Gastrointestinal: negative Genitourinary:negative Integument/breast: negative Hematologic/lymphatic: negative Musculoskeletal:positive for muscle weakness Neurological: negative Behavioral/Psych: negative Endocrine: negative Allergic/Immunologic: negative  Physical Exam  BJY:NWGNF, healthy, no distress, well nourished, well developed, and anxious SKIN: skin color, texture, turgor are normal, no rashes or significant lesions HEAD: Normocephalic, No masses, lesions, tenderness or abnormalities EYES: normal, PERRLA, Conjunctiva are pink and non-injected EARS: External ears  normal, Canals clear OROPHARYNX:no exudate, no erythema, and lips, buccal mucosa, and tongue normal  NECK: supple, no adenopathy, no JVD LYMPH:  no palpable lymphadenopathy, no hepatosplenomegaly LUNGS: clear to auscultation , and palpation HEART: regular rate & rhythm, no murmurs, and no gallops ABDOMEN:abdomen soft, non-tender, normal bowel sounds, and no masses or organomegaly BACK: Back symmetric, no curvature., No CVA tenderness EXTREMITIES:no joint deformities, effusion, or inflammation, no edema  NEURO: alert & oriented x 3 with fluent speech, no focal motor/sensory deficits  PERFORMANCE STATUS: ECOG 1  LABORATORY DATA: Lab Results  Component Value Date    WBC 6.8 04/01/2023   HGB 13.6 04/01/2023   HCT 42.4 04/01/2023   MCV 88.7 04/01/2023   PLT 202 04/01/2023      Chemistry      Component Value Date/Time   NA 139 04/01/2023 0703   K 4.0 04/01/2023 0703   CL 106 04/01/2023 0703   CO2 22 04/01/2023 0703   BUN 14 04/01/2023 0703   CREATININE 0.90 04/01/2023 0703      Component Value Date/Time   CALCIUM 8.7 (L) 04/01/2023 0703   ALKPHOS 39 01/13/2011 1930   AST 17 01/13/2011 1930   ALT 16 01/13/2011 1930   BILITOT 0.8 01/13/2011 1930       RADIOGRAPHIC STUDIES: DG O-ARM IMAGE ONLY/NO REPORT  Result Date: 04/07/2023 There is no Radiologist interpretation  for this exam.  CT CHEST ABDOMEN PELVIS W CONTRAST  Result Date: 03/31/2023 CLINICAL DATA:  Brain lesions on recent MRI. Evaluate for possible primary tumor. * Tracking Code: BO * EXAM: CT CHEST, ABDOMEN, AND PELVIS WITH CONTRAST TECHNIQUE: Multidetector CT imaging of the chest, abdomen and pelvis was performed following the standard protocol during bolus administration of intravenous contrast. RADIATION DOSE REDUCTION: This exam was performed according to the departmental dose-optimization program which includes automated exposure control, adjustment of the mA and/or kV according to patient size and/or use of iterative reconstruction technique. CONTRAST:  OMNIPAQUE IOHEXOL 300 MG/ML  SOLN COMPARISON:  None Available. FINDINGS: CT CHEST FINDINGS Cardiovascular: The heart is normal in size. No pericardial effusion. The aorta is normal in caliber. No dissection. Scattered coronary artery calcifications. Mediastinum/Nodes: Necrotic nodal mass in the subcarinal station measuring a maximum of 4.0 x 2.5 cm. There is also a mildly necrotic left hilar node measuring 13 mm on image 36/3. The esophagus is grossly normal. Lungs/Pleura: 5 cm spiculated central left lobe lung mass consistent with primary lung neoplasm. Subpleural nodular density in the right lower lobe posteriorly could be a  metastatic lesion or an area of rounded atelectasis. Other small patchy subpleural densities are noted in the superior segment of the right lower lobe and also in the left lower lobe. Indeterminate findings requiring surveillance. Underlying emphysematous changes pulmonary scarring. Musculoskeletal: No significant bony findings. CT ABDOMEN PELVIS FINDINGS Hepatobiliary: None no hepatic lesions are identified. No intrahepatic biliary dilatation. The gallbladder is unremarkable. Normal caliber common bile duct. Pancreas: Normal Spleen: Splenomegaly. The spleen measures 14.513.510.0 cm. Small calcified granulomas. No splenic lesions. Adrenals/Urinary Tract: The adrenal glands and kidneys are. No worrisome renal or adrenal gland lesions. The bladder is unremarkable. Stomach/Bowel: No significant findings. Vascular/Lymphatic: Age advanced atherosclerotic calcifications involving the aorta and iliac arteries but no aneurysm or dissection. The major venous structures are patent. No mesenteric or retroperitoneal mass or adenopathy. Reproductive: The prostate gland and seminal vesicles are. There is a large left hemiscrotum hydrocele noted. Other: No pelvic mass or adenopathy. No free pelvic fluid collections. No  inguinal mass or adenopathy. No abdominal wall hernia or subcutaneous lesions. Musculoskeletal: No significant bony findings. No lytic or sclerotic bone lesions. IMPRESSION: 1. 5 cm spiculated central left lobe lung mass consistent with primary lung neoplasm. 2. Necrotic nodal mass in the subcarinal station and left hilar region consistent with metastatic disease. 3. Subpleural nodular density in the right lower lobe posteriorly could be a metastatic lesion or an area of rounded atelectasis. Other small patchy subpleural densities are noted in the superior segment of the right lower lobe and also in the left lower lobe. Indeterminate findings requiring surveillance. 4. No findings for metastatic disease involving  the abdomen/pelvis or bony structures. 5. Splenomegaly of uncertain etiology. 6. Age advanced atherosclerotic calcifications involving the aorta and iliac arteries. 7. Large left hemiscrotum hydrocele. 8. Aortic atherosclerosis. Aortic Atherosclerosis (ICD10-I70.0). Electronically Signed   By: Rudie Meyer M.D.   On: 03/31/2023 12:28   MR Brain W and Wo Contrast  Addendum Date: 03/31/2023   ADDENDUM REPORT: 03/31/2023 12:04 ADDENDUM: Dural-based metastasis is a differential consideration. Electronically Signed   By: Orvan Falconer M.D.   On: 03/31/2023 12:04   Result Date: 03/31/2023 CLINICAL DATA:  Headache, new onset (Age >= 51y) mass on CT. EXAM: MRI HEAD WITHOUT AND WITH CONTRAST TECHNIQUE: Multiplanar, multiecho pulse sequences of the brain and surrounding structures were obtained without and with intravenous contrast. CONTRAST:  7mL GADAVIST GADOBUTROL 1 MMOL/ML IV SOLN COMPARISON:  Head CT 03/31/2023. FINDINGS: Brain: 23 x 23 mm dural-based mass along the posterior aspect of the right petrous temporal bone with associated dural tail (image 14 series 19), favored to represent a meningioma. Extensive vasogenic edema throughout the right cerebellar hemisphere with partial effacement of the fourth ventricle and mass effect on brainstem. No acute infarct or hemorrhage. Mild chronic small-vessel disease. Lateral and third ventricles are nondilated, though there is confluence T2 hyperintensity along occipital horns, which could represent developing hydrocephalus. Vascular: Normal flow voids and vessel enhancement. Skull and upper cervical spine: Normal marrow signal and enhancement. Sinuses/Orbits: Unremarkable. Other: None. IMPRESSION: 1. 23 mm dural-based mass along the posterior aspect of the right petrous temporal bone with associated dural tail, favored to represent a meningioma. Extensive vasogenic edema throughout the right cerebellar hemisphere with partial effacement of the fourth ventricle and mass  effect on brainstem. 2. Lateral and third ventricles are nondilated, though there is confluence T2 hyperintensity along occipital horns, which could represent developing hydrocephalus. Electronically Signed: By: Orvan Falconer M.D. On: 03/31/2023 11:55   CT HEAD WO CONTRAST  Result Date: 03/31/2023 CLINICAL DATA:  Provided history: Headache, new onset. Additional history provided: Headache for one week. Nausea/vomiting. Dizziness. EXAM: CT HEAD WITHOUT CONTRAST TECHNIQUE: Contiguous axial images were obtained from the base of the skull through the vertex without intravenous contrast. RADIATION DOSE REDUCTION: This exam was performed according to the departmental dose-optimization program which includes automated exposure control, adjustment of the mA and/or kV according to patient size and/or use of iterative reconstruction technique. COMPARISON:  No pertinent prior exams available for comparison. FINDINGS: Brain: No age advanced or lobar predominant parenchymal atrophy. 2.2 x 1.6 cm hyperdense mass within or along the inferolateral right cerebellar hemisphere (for instance as seen on series 4, image 58). Prominent edema within the right cerebellar hemisphere. Associated mass effect with partial effacement of the fourth ventricle. No evidence of obstructive hydrocephalus. Mild-to-moderate patchy and ill-defined hypoattenuation within the cerebral white matter, nonspecific but most often secondary to chronic small vessel ischemia. No demarcated cortical infarct.  No extra-axial fluid collection. No supratentorial midline shift. Vascular: No hyperdense vessel.  Atherosclerotic calcifications. Skull: No fracture or aggressive osseous lesion. Sinuses/Orbits: Chronic, medially displaced fracture deformity of the left lamina papyracea. No mass or acute finding within the imaged orbits. Mild mucosal thickening scattered within the paranasal sinuses at the imaged levels. Impression #1 called by telephone at the time of  interpretation on 03/31/2023 at 10:14 am to provider Dr. Cathren Laine, who verbally acknowledged these results. IMPRESSION: 1. 2.2 x 1.6 cm mass within or along the inferolateral right cerebellar hemisphere. Prominent surrounding edema within the right cerebellar hemisphere. Associated mass effect with partial effacement of the fourth ventricle. No evidence of obstructive hydrocephalus. A brain MRI (with and without contrast) is recommended for further evaluation. 2. Mild-to-moderate cerebral white matter disease, nonspecific but most often secondary to chronic small vessel ischemia. Electronically Signed   By: Jackey Loge D.O.   On: 03/31/2023 10:16    ASSESSMENT: This is a very pleasant 62 years old white male diagnosed with stage IV (T2b, N2, M1b) non-small cell lung cancer, adenocarcinoma diagnosed in June 2024 with PD-L1 expression of 80% but the other molecular studies are still pending.  He presented a large central left lung mass in addition to left hilar and mediastinal lymphadenopathy as well as additional lung lesion and solitary brain metastasis involving the inferior lateral right cerebellar hemisphere status post craniotomy and surgical resection on April 07, 2023 under the care of [name] and the final pathology was consistent with adenocarcinoma of lung primary.   PLAN: I had a lengthy discussion with the patient and his wife today about his current disease stage, prognosis and treatment options. I personally and independently reviewed the scan images and discussed results with the patient and his wife. I recommended for him to complete the staging workup by ordering a PET scan to rule out any other metastatic disease. I explained to the patient and his wife that he has incurable condition and all the treatment will be of palliative nature. He was given the option of palliative care versus palliative systemic chemoimmunotherapy versus immunotherapy as a single modality versus treatment  with targeted therapy if the patient has an actionable mutation. His PD-L1 expression is 80% but the remaining molecular studies are still pending. I recommended for the patient wait until we have the for molecular studies before proceeding with the treatment modality.  He is interested in treatment. I will see him back for follow-up visit in 2 weeks for evaluation and more detailed discussion of options based on the molecular studies. For the recent brain metastasis, he is currently on a tapered dose of Decadron and I will give him refill for the next 2-3 weeks. I will also refer the patient to radiation oncology for discussion of radiotherapy to the brain resection cavity if needed. The patient was strongly advised to quit smoking and he is trying. He was advised to call immediately if he has any other concerning symptoms in the interval. The patient voices understanding of current disease status and treatment options and is in agreement with the current care plan.  All questions were answered. The patient knows to call the clinic with any problems, questions or concerns. We can certainly see the patient much sooner if necessary.  Thank you so much for allowing me to participate in the care of AMANDA POTE. I will continue to follow up the patient with you and assist in his care.  The total time spent in  the appointment was 90 minutes.  Disclaimer: This note was dictated with voice recognition software. Similar sounding words can inadvertently be transcribed and may not be corrected upon review.   Lajuana Matte April 20, 2023, 1:31 PM

## 2023-04-21 ENCOUNTER — Telehealth: Payer: Self-pay | Admitting: Medical Oncology

## 2023-04-21 ENCOUNTER — Telehealth: Payer: Self-pay | Admitting: Internal Medicine

## 2023-04-21 ENCOUNTER — Other Ambulatory Visit: Payer: Self-pay | Admitting: Internal Medicine

## 2023-04-21 MED ORDER — DEXAMETHASONE 4 MG PO TABS: ORAL_TABLET | ORAL | 0 refills | Status: DC

## 2023-04-21 NOTE — Progress Notes (Addendum)
Histology and Location of Primary Cancer: stage IV (T2b, N2, M1b) non-small cell lung cancer, adenocarcinoma diagnosed in June 2024  CT CHEST ABDOMEN PELVIS W CONTRAST 03/31/2023  IMPRESSION: 1. 5 cm spiculated central left lobe lung mass consistent with primary lung neoplasm. 2. Necrotic nodal mass in the subcarinal station and left hilar region consistent with metastatic disease. 3. Subpleural nodular density in the right lower lobe posteriorly could be a metastatic lesion or an area of rounded atelectasis. Other small patchy subpleural densities are noted in the superior segment of the right lower lobe and also in the left lower lobe. Indeterminate findings requiring surveillance. 4. No findings for metastatic disease involving the abdomen/pelvis or bony structures. 5. Splenomegaly of uncertain etiology. 6. Age advanced atherosclerotic calcifications involving the aorta and iliac arteries. 7. Large left hemiscrotum hydrocele. 8. Aortic atherosclerosis.  Location(s) of Symptomatic tumor(s):  Pt to have PET scan on 04-29-23  MR Brain W and Wo Contrast 03/31/2023  IMPRESSION: 1. 23 mm dural-based mass along the posterior aspect of the right petrous temporal bone with associated dural tail, favored to represent a meningioma. Extensive vasogenic edema throughout the right cerebellar hemisphere with partial effacement of the fourth ventricle and mass effect on brainstem. 2. Lateral and third ventricles are nondilated, though there is confluence T2 hyperintensity along occipital horns, which could represent developing hydrocephalus.  ADDENDUM: Dural-based metastasis is a differential consideration. FINAL MICROSCOPIC DIAGNOSIS:   04-07-23 A. BRAIN TUMOR, RIGHT CEREBELLAR, RESECTION:  - Metastatic poorly differentiated adenocarcinoma, consistent with a  lung primary, see comment   COMMENT:   Immunohistochemical stains show that the metastatic carcinoma is  positive for CK7 and  TTF-1 while it is negative for CK20, p63 and CK5/6,  consistent with above interpretation.   Past/Anticipated chemotherapy by medical oncology, if any:  Si Gaul, MD 04/20/2023  HPI Matthew Rivera is a 62 y.o. male with past medical history significant for GERD, osteoarthritis, dyslipidemia and long history of smoking.  The patient presented to the emergency department at [facility] on 03/31/2023 complaining of headaches associated with dizziness as well as nausea and vomiting.  This has been going on for 7-10 days before his presentation and it was getting worse.  He has been taking over-the-counter medication with no improvement.  At the emergency department he had CT scan of the head without contrast that showed 2.2 x 1.6 cm mass within or along the inferior lateral right cerebellar hemisphere with prominent surrounding edema within the right cerebellar hemisphere and associated mass effect with partial effacement of the fourth ventricle.  There was no evidence of obstructive hydrocephalus and brain MRI was recommended which was done on the same day and that showed 2.3 cm dural based mass along the posterior aspect of the right petrous temporal bone with associated dural tail favored to represent a meningioma but there was extensive vasogenic edema throughout the right cerebellar hemisphere with partial effacement of the fourth ventricle and mass effect on brainstem.  The dural based metastasis was still a consideration.  The patient also had staging CT scan of the chest, abdomen and pelvis performed on 03/31/2023 and that showed 5.0 cm spiculated central left upper lobe mass consistent with primary lung neoplasm.  There was necrotic nodal mass in the subcarinal station and left hilar region consistent with metastatic disease.  There was also subpleural nodular density in the right lower lobe posteriorly that could be a metastatic lesion or an area of rounded atelectasis.  Other small patchy  subpleural densities are noted in the superior segment of the right lower lobe and also in the left lower lobe that are indeterminate and require surveillance.  There was no findings of metastatic disease involving the abdomen/pelvis or bony structures. On 04/07/2023 the patient underwent right suboccipital craniotomy for gross total resection of the cerebellar tumor using microdissection under the care of [name].  The final pathology [id number]) showed metastatic poorly differentiated adenocarcinoma consistent with a lung primary. Immunohistochemical stains show that the metastatic carcinoma is  positive for CK7 and TTF-1 while it is negative for CK20, p63 and CK5/6, consistent with above interpretation.  [facility] for molecular studies and PD-L1 expression.  PD-L1 expression was 80%. The patient was referred to me today for evaluation and recommendation regarding treatment of his condition. When seen to date the patient continues to complain of fatigue and shortness of breath with exertion as well as weakness in his left leg.  He denied having any headache or visual changes.  He denied having any chest pain, cough or hemoptysis.  He has no nausea, vomiting, diarrhea or constipation.  He has no weight loss or night sweats. Family history significant for mother with hypertension.  Father with diabetes.  Maternal grandmother with breast cancer and paternal grandfather with cancer. The patient is married and has 1 biologic son and several stepchildren.  He was accompanied today by [name].  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  He has a history for smoking around 1 pack/day for around 50 years and still smoking and trying to quit.  He has no history of alcohol or drug abuse.    ASSESSMENT: This is a very pleasant 62 years old white male diagnosed with stage IV (T2b, N2, M1b) non-small cell lung cancer, adenocarcinoma  diagnosed in June 2024 with PD-L1 expression of 80% but the other molecular studies are still pending.  He presented a large central left lung mass in addition to left hilar and mediastinal lymphadenopathy as well as additional lung lesion and solitary brain metastasis involving the inferior lateral right cerebellar hemisphere status post craniotomy and surgical resection on April 07, 2023 under the care of [name] and the final pathology was consistent with adenocarcinoma of lung primary.     PLAN: I had a lengthy discussion with the patient and his wife today about his current disease stage, prognosis and treatment options. I personally and independently reviewed the scan images and discussed results with the patient and his wife. I recommended for him to complete the staging workup by ordering a PET scan to rule out any other metastatic disease. I explained to the patient and his wife that he has incurable condition and all the treatment will be of palliative nature. He was given the option of palliative care versus palliative systemic chemoimmunotherapy versus immunotherapy as a single modality versus treatment with targeted therapy if the patient has an actionable mutation. His PD-L1 expression is 80% but the remaining molecular studies are still pending. I recommended for the patient wait until we have the for molecular studies before proceeding with the treatment modality.  He is interested in treatment. I will see him back for follow-up visit in 2 weeks for evaluation and more detailed discussion of options based on the molecular studies. For the recent brain metastasis, he is currently on a tapered dose of Decadron and I will give him refill for the next 2-3  weeks. I will also refer the patient to radiation oncology for discussion of radiotherapy to the brain resection cavity if needed. The patient was strongly advised to quit smoking and he is trying.  Patient's main complaints related to  symptomatic tumor(s) are:  complaining of headaches associated with dizziness as well as nausea and vomiting.  This has been going on for 7-10 days before his presentation and it was getting worse.  He has been taking over-the-counter medication with no improvement.   Interventions by Neurosurgery:  04-07-23 Brief history: The patient is a 62 year old white male who presented to the ER with headaches.  He was worked up with a head CT and a brain MRI which demonstrated a right cerebellar tumor.  He went on to have a CT of the chest abdomen pelvis which demonstrated a lung tumor.  I discussed the various treatment options with him.  He has decided proceed with surgery.   Preop diagnosis: Right cerebellar tumor   Postop diagnosis: The same   Procedure: Right suboccipital craniectomy for gross total resection of cerebellar tumor using microdissection and with application of Stealth stereotactic navigation   Surgeon: Dr. Delma Officer   Pain on a scale of 0-10 is: pt has chronic back pain, well controlled by pain meds. Pt does also report very mild headaches intermittently. Pt denies dizziness, nausea, or any other symptoms.   If Spine Met(s), symptoms, if any, include: Bowel/Bladder retention or incontinence (please describe): na Numbness or weakness in extremities (please describe): na Current Decadron regimen, if applicable: yes, on a taper now  Ambulatory status? Walker? Wheelchair?: ambulates well on his own  SAFETY ISSUES: Prior radiation? none Pacemaker/ICD? none Possible current pregnancy? no Is the patient on methotrexate? no  Additional Complaints / other details:  No major concerns or questions at this time. Wants MRI results. Pt is working on quitting smoking as well.

## 2023-04-21 NOTE — Telephone Encounter (Signed)
Dexamethasone prescription needs to be clarified.

## 2023-04-21 NOTE — Telephone Encounter (Signed)
Scheduled per 07/10 los, patient has been called and notified. 

## 2023-04-22 ENCOUNTER — Other Ambulatory Visit (HOSPITAL_COMMUNITY): Payer: 59

## 2023-04-22 ENCOUNTER — Telehealth: Payer: Self-pay

## 2023-04-22 ENCOUNTER — Other Ambulatory Visit: Payer: Self-pay

## 2023-04-22 DIAGNOSIS — C7931 Secondary malignant neoplasm of brain: Secondary | ICD-10-CM

## 2023-04-22 NOTE — Telephone Encounter (Signed)
Attempted to call patient again to discuss appointments scheduled on his behalf. Unable to reach him by phone. Will attempt MyChart again.

## 2023-04-25 ENCOUNTER — Encounter (HOSPITAL_COMMUNITY): Payer: Self-pay

## 2023-04-25 ENCOUNTER — Ambulatory Visit (HOSPITAL_COMMUNITY): Payer: 59

## 2023-04-26 ENCOUNTER — Telehealth: Payer: Self-pay | Admitting: Radiation Therapy

## 2023-04-26 NOTE — Telephone Encounter (Signed)
I spoke with the patient's wife about his upcoming treatment planning appointments. She has everything recorded on their calendar and plans to attend. I mentioned that Matthew Rivera's voicemail box is full on his phone, she said the best contact for him is to call her.   Jalene Mullet R.T.(R)(T) Radiation Special Procedures Navigator

## 2023-04-28 ENCOUNTER — Ambulatory Visit (HOSPITAL_COMMUNITY)
Admission: RE | Admit: 2023-04-28 | Discharge: 2023-04-28 | Disposition: A | Discharge status: Home / Self Care | Payer: 59 | Source: Ambulatory Visit | Attending: Radiation Oncology | Admitting: Radiation Oncology

## 2023-04-28 DIAGNOSIS — C7931 Secondary malignant neoplasm of brain: Secondary | ICD-10-CM | POA: Diagnosis not present

## 2023-04-28 MED ORDER — GADOBUTROL 1 MMOL/ML IV SOLN
6.0000 mL | Freq: Once | INTRAVENOUS | Status: AC | PRN
  Administered 2023-04-28: 6 mL via INTRAVENOUS

## 2023-04-29 ENCOUNTER — Ambulatory Visit: Admission: RE | Admit: 2023-04-29 | Payer: 59 | Source: Ambulatory Visit

## 2023-04-29 DIAGNOSIS — R590 Localized enlarged lymph nodes: Secondary | ICD-10-CM | POA: Diagnosis not present

## 2023-04-29 DIAGNOSIS — I2510 Atherosclerotic heart disease of native coronary artery without angina pectoris: Secondary | ICD-10-CM | POA: Insufficient documentation

## 2023-04-29 DIAGNOSIS — R911 Solitary pulmonary nodule: Secondary | ICD-10-CM | POA: Diagnosis not present

## 2023-04-29 DIAGNOSIS — I700 Atherosclerosis of aorta: Secondary | ICD-10-CM | POA: Insufficient documentation

## 2023-04-29 DIAGNOSIS — C3490 Malignant neoplasm of unspecified part of unspecified bronchus or lung: Secondary | ICD-10-CM | POA: Diagnosis present

## 2023-04-29 DIAGNOSIS — C771 Secondary and unspecified malignant neoplasm of intrathoracic lymph nodes: Secondary | ICD-10-CM | POA: Insufficient documentation

## 2023-04-29 DIAGNOSIS — J439 Emphysema, unspecified: Secondary | ICD-10-CM | POA: Diagnosis not present

## 2023-04-29 DIAGNOSIS — R918 Other nonspecific abnormal finding of lung field: Secondary | ICD-10-CM | POA: Insufficient documentation

## 2023-04-29 LAB — GLUCOSE, CAPILLARY: Glucose-Capillary: 125 mg/dL — ABNORMAL HIGH (ref 70–99)

## 2023-04-29 MED ORDER — FLUDEOXYGLUCOSE F - 18 (FDG) INJECTION
8.4100 | Freq: Once | INTRAVENOUS | Status: AC | PRN
  Administered 2023-04-29: 8.41 via INTRAVENOUS

## 2023-05-02 ENCOUNTER — Encounter: Payer: Self-pay | Admitting: Radiation Oncology

## 2023-05-02 ENCOUNTER — Telehealth: Payer: Self-pay

## 2023-05-02 NOTE — Progress Notes (Signed)
[facility] OFFICE PROGRESS NOTE  Matthew Nevins, MD [address] [place] [address]  DIAGNOSIS: stage IV (T2b, N2, M1b) non-small cell lung cancer, adenocarcinoma. He presented a large central left lung mass in addition to left hilar and mediastinal lymphadenopathy as well as additional lung lesion in the right lower lobe and solitary brain metastasis involving the inferior lateral right cerebellar hemisphere. He was diagnosed in June 2024.  PDL1 Expression 80%  Molecular studies: Positive for KRAS G12C mutation which can be used in the second line setting  PRIOR THERAPY: 1) status post craniotomy and surgical resection on April 07, 2023 under the care of [name]   CURRENT THERAPY: 1) Radiation to the brain cavity starting 05/06/23 seeing Matthew Rivera.  Plan is for 3 treatments 2): Systemic chemotherapy with carboplatin for an AUC of 5, Alimta 500 mg/m, and Keytruda 200 mg IV every 3 weeks.  First dose on 05/16/2023   INTERVAL HISTORY: Matthew Rivera 62 y.o. male returns to the clinic today for a follow-up visit accompanied by his wife.  The patient was recently diagnosed with metastatic lung cancer.  He was seen in the clinic by Dr. Arbutus Ped on 04/20/2023.  At that point in time, he need to complete the staging workup with a PET scan. When the patient was seen by Dr. Arbutus Ped earlier this month, we were waiting for the results of his molecular studies to determine his systemic treatment options.  His molecular studies showed K-ras G12 C mutation which can be used as targeted treatment in the second line setting.   He is also seeing Matthew Rivera from radiation oncology to see if he requires additional radiation to the resection cavity.  Dr. Karoline Caldwell planning on 3 SRS treatments.  His extra stimulation is scheduled for 05/06/2023.    The patient overall is feeling fair today except he feels fatigued and generally weak.  His wife states he was a little bit more active yesterday  and may be consequently been more fatigued today.  It looks like the patient was prescribed a Decadron taper on 04/21/2023.  The patient did not realize that he had a Decadron taper at his pharmacy and he has been taking 4 mg of Decadron twice a day.    Denies any fever, chills, night sweats, or unexplained weight loss.  He did mention that his lips are little more chapped since having surgery.  He also reports some taste alterations.  He reports that sometimes his breathing is "a little bit short but not bad".  He states this is the same as when he was evaluated last time.  Denies any chest pain or hemoptysis.  He reports that his headache and dizziness have gotten better.  He may have a little bit of discomfort at his surgical site his prior brain surgery but states that it is nothing major.  He denies any nausea, vomiting, diarrhea, or constipation.  He does report that his bowel movements look like "rabbit droppings".  He is stool softener at home but has not been using this.  He does have bowel movements daily.  He is here today for evaluation and for more detailed discussion about his current condition and treatment options.    MEDICAL HISTORY: Past Medical History:  Diagnosis Date   Dyslipidemia    GERD (gastroesophageal reflux disease)    Lung nodule seen on imaging study 03/31/2023   seen on CT results   Osteoarthritis    Smoker    long  term smoker 1 ppd    ALLERGIES:  has No Known Allergies.  MEDICATIONS:  Current Outpatient Medications  Medication Sig Dispense Refill   acetaminophen-caffeine (EXCEDRIN TENSION HEADACHE) 500-65 MG TABS per tablet Take 2 tablets by mouth daily as needed (pain). (Patient not taking: Reported on 04/20/2023)     dexamethasone (DECADRON) 4 MG tablet 1 tablet p.o. twice daily for 1 week then 1 tablet p.o. daily for 1 week then 0.5 tablet p.o. daily for 1 week then stop 30 tablet 0   HYDROcodone-acetaminophen (NORCO) 10-325 MG tablet Take 1 tablet by mouth  every 4 (four) hours as needed for moderate pain.     Naphazoline-Pheniramine (VISINE OP) Place 1 drop into both eyes daily as needed (dry eyes).     nicotine (NICODERM CQ - DOSED IN MG/24 HOURS) 21 mg/24hr patch Place 1 patch (21 mg total) onto the skin daily. 28 patch 0   OVER THE COUNTER MEDICATION Take 1 tablet by mouth daily as needed (constipation). Stool softner     pantoprazole (PROTONIX) 40 MG tablet Take 1 tablet (40 mg total) by mouth 2 (two) times daily. 60 tablet 2   tamsulosin (FLOMAX) 0.4 MG CAPS capsule Take 0.4 mg by mouth at bedtime as needed (urinary flow issues).     traMADol (ULTRAM) 50 MG tablet Take 50-100 mg by mouth 4 (four) times daily as needed for moderate pain.     zolpidem (AMBIEN) 10 MG tablet Take 10 mg by mouth at bedtime as needed for sleep.     No current facility-administered medications for this visit.    SURGICAL HISTORY:  Past Surgical History:  Procedure Laterality Date   BACK SURGERY     x 1   COLONOSCOPY WITH PROPOFOL N/A 08/18/2020   Procedure: COLONOSCOPY WITH PROPOFOL;  Surgeon: Lanelle Bal, DO;  Location: [facility];  Service: Endoscopy;  Laterality: N/A;  7:30am   CRANIOTOMY Right 04/07/2023   Procedure: Right suboccipital craniotomy for tumor resection;  Surgeon: Tressie Stalker, MD;  Location: [facility] OR;  Service: Neurosurgery;  Laterality: Right;  TO follow   MULTIPLE TOOTH EXTRACTIONS     wears full dentures   POLYPECTOMY  08/18/2020   Procedure: POLYPECTOMY INTESTINAL;  Surgeon: Lanelle Bal, DO;  Location: [facility];  Service: Endoscopy;;    REVIEW OF SYSTEMS:   Review of Systems  Constitutional: Positive for fatigue and generalized weakness.  Negative for appetite change, chills, fever and unexpected weight change.  HENT: Positive for taste alterations.  Negative for mouth sores, nosebleeds, sore throat and trouble swallowing.   Eyes: Negative for eye problems and icterus.  Respiratory: Positive for occasional dyspnea  on exertion.  Negative for cough, hemoptysis, and wheezing.   Cardiovascular: Negative for chest pain and leg swelling.  Gastrointestinal: Negative for abdominal pain, constipation, diarrhea, nausea and vomiting.  Genitourinary: Negative for bladder incontinence, difficulty urinating, dysuria, frequency and hematuria.   Musculoskeletal: Negative for back pain, gait problem, neck pain and neck stiffness.  Skin: Negative for itching and rash.  Neurological: Negative for dizziness, extremity weakness, gait problem, headaches, light-headedness and seizures.  Hematological: Negative for adenopathy. Does not bruise/bleed easily.  Psychiatric/Behavioral: Negative for confusion, depression and sleep disturbance. The patient is not nervous/anxious.     PHYSICAL EXAMINATION:  There were no vitals taken for this visit.  ECOG PERFORMANCE STATUS: 1  Physical Exam  Constitutional: Oriented to person, place, and time and thin appearing male, and in no distress.  HENT:  Head: Normocephalic  and atraumatic.  Mouth/Throat: Oropharynx is clear and moist. No oropharyngeal exudate.  Eyes: Conjunctivae are normal. Right eye exhibits no discharge. Left eye exhibits no discharge. No scleral icterus.  Neck: Normal range of motion. Neck supple.  Cardiovascular: Normal rate, regular rhythm, normal heart sounds and intact distal pulses.   Pulmonary/Chest: Effort normal.  Breath sounds bilaterally.  Mild wheezing.  No respiratory distress. No rales.  Abdominal: Soft. Bowel sounds are normal. Exhibits no distension and no mass. There is no tenderness.  Musculoskeletal: Normal range of motion. Exhibits no edema.  Lymphadenopathy:    No cervical adenopathy.  Neurological: Alert and oriented to person, place, and time. Exhibits also wasting.  Gait coordination normal. Skin: Positive for bruising on his extremities.  Skin is warm and dry. No rash noted. Not diaphoretic. No erythema. No pallor.  Psychiatric: Mood, memory  and judgment normal.  Vitals reviewed.  LABORATORY DATA: Lab Results  Component Value Date   WBC 9.7 04/20/2023   HGB 14.3 04/20/2023   HCT 44.1 04/20/2023   MCV 93.0 04/20/2023   PLT 94 (L) 04/20/2023      Chemistry      Component Value Date/Time   NA 136 04/20/2023 1319   K 4.0 04/20/2023 1319   CL 101 04/20/2023 1319   CO2 31 04/20/2023 1319   BUN 17 04/20/2023 1319   CREATININE 0.90 04/20/2023 1319      Component Value Date/Time   CALCIUM 8.5 (L) 04/20/2023 1319   ALKPHOS 56 04/20/2023 1319   AST 15 04/20/2023 1319   ALT 45 (H) 04/20/2023 1319   BILITOT 1.0 04/20/2023 1319       RADIOGRAPHIC STUDIES:  NM PET Image Initial (PI) Skull Base To Thigh (F-18 FDG)  Result Date: 04/29/2023 CLINICAL DATA:  Initial treatment strategy for non-small-cell lung cancer. Staging. Suboccipital craniotomy for tumor resection. EXAM: NUCLEAR MEDICINE PET SKULL BASE TO THIGH TECHNIQUE: 8.4 mCi F-18 FDG was injected intravenously. Full-ring PET imaging was performed from the skull base to thigh after the radiotracer. CT data was obtained and used for attenuation correction and anatomic localization. Fasting blood glucose: 125 mg/dl COMPARISON:  60/45/4098 chest abdomen and pelvic CTs. FINDINGS: Mediastinal blood pool activity: SUV max 1.7 Liver activity: SUV max NA NECK: No areas of abnormal hypermetabolism. Right posterior fossa photopenia consistent with suboccipital craniotomy resection. Incidental CT findings: No cervical adenopathy. CHEST: Left infrahilar, hilar, and subcarinal nodal metastasis. Index subcarinal node measures 2.0 cm and a S.U.V. max of 6.0 on 75/3. Suprahilar and infrahilar nodes are diminutive. Left lower lobe lung mass measures 4.2 x 2.3 cm and a S.U.V. max of 5.1 on 88/4. Dependent right lower lobe pulmonary nodule measures 2.4 cm and a S.U.V. max of 6.1 on 89/4. Left lower lobe pulmonary nodule measures 6 mm and a S.U.V. max of 2.9 on 84/4. Incidental CT findings:  Deferred to recent diagnostic CT. A right lower lobe 5 mm pulmonary nodule is below PET resolution. Centrilobular emphysema. Aortic and coronary artery calcification. ABDOMEN/PELVIS: Posterior segment 4A mild hypermetabolism is without correlate on today's CT or the recent contrast-enhanced CT. Measures a S.U.V. max of 3.4. No abdominopelvic nodal hypermetabolism. Incidental CT findings: Deferred to recent diagnostic CT. Abdominal aortic atherosclerosis. Normal adrenal glands. Large left hydrocele. SKELETON: No abnormal marrow activity. Incidental CT findings: None. IMPRESSION: 1. Dominant left lower lobe lung mass with thoracic nodal metastasis. 2. Smaller bilateral hypermetabolic pulmonary nodules are likely metastasis. The 2.4 cm right lower lobe nodule could represent a synchronous primary.  3. No hypermetabolic extrathoracic metastasis identified. 4. Focus of mild hypermetabolism within the posterior aspect of segment 4A of the liver is without correlate and favored to be related to hepatic altered perfusion. Recommend attention on follow-up. 5. Incidental findings, including: Coronary artery atherosclerosis. Aortic Atherosclerosis (ICD10-I70.0). Emphysema (ICD10-J43.9). Electronically Signed   By: Jeronimo Greaves M.D.   On: 04/29/2023 14:21   MR Brain W Wo Contrast  Result Date: 04/28/2023 CLINICAL DATA:  Brain metastases. EXAM: MRI HEAD WITHOUT AND WITH CONTRAST TECHNIQUE: Multiplanar, multiecho pulse sequences of the brain and surrounding structures were obtained without and with intravenous contrast. CONTRAST:  6mL GADAVIST GADOBUTROL 1 MMOL/ML IV SOLN COMPARISON:  Brain MRI 03/31/2023 FINDINGS: Brain: There has been interval right suboccipital craniotomy for mass resection. There is mild curvature linear enhancement at the periphery of the resection site but no residual masslike or nodular enhancement. There is a small amount of gliosis and possible ischemia (310-13) in the adjacent cerebellar parenchyma  with associated postsurgical blood products, the extent of edema has markedly decreased compared to the preoperative study. There is no new mass lesion or other abnormal enhancement. There is no evidence of acute intracranial hemorrhage, extra-axial fluid collection, or acute territorial infarct. Background parenchymal volume is normal. The ventricles are normal in size. Scattered foci of FLAIR signal abnormality in the supratentorial brain are again seen, consistent with chronic small-vessel ischemic change. The pituitary and suprasellar region are normal. There is no midline shift. Vascular: Normal flow voids. Skull and upper cervical spine: Postsurgical changes as above. There is no suspicious marrow signal abnormality. Sinuses/Orbits: The paranasal sinuses are clear. The globes and orbits are unremarkable. Other: There are small bilateral mastoid effusions. IMPRESSION: 1. Interval right suboccipital craniotomy for cerebellar mass resection with essentially resolved edema in the cerebellar hemisphere. 2. Thin curvilinear enhancement at the periphery of the resection cavity is favored postsurgical. Recommend attention on follow-up. 3. No new mass lesion. Electronically Signed   By: Lesia Hausen M.D.   On: 04/28/2023 15:19   DG O-ARM IMAGE ONLY/NO REPORT  Result Date: 04/07/2023 There is no Radiologist interpretation  for this exam.    ASSESSMENT/PLAN:  This is a very pleasant 62 year old male with stage IV (T2b, N2, M1b) non-small cell lung cancer, adenocarcinoma. He presented a large central left lung mass in addition to left hilar and mediastinal lymphadenopathy as well as additional lung lesion in the right lower lobe and solitary brain metastasis involving the inferior lateral right cerebellar hemisphere. He was diagnosed in June 2024.   His PD-L1 expression is 80%.  His molecular studies by foundation 1 shows K-ras G12 C mutation which can be used in the second line setting  The patient underwent  craniotomy and surgical resection on 04/07/2023 by [name].  He is seeing radiation oncology and they planning SRS in 3 fractions starting on 05/06/2023 by Matthew Rivera.  Dr. Arbutus Ped reviewed his PET scan results.  Dr. Arbutus Ped had a lengthly discussion with the patient today about her current condition and treatment options. The patient was given the option of a referral to hospice/palliative vs. Treatment with systemic chemotherapy with carboplatin for an AUC of 5, Alimta 500 mg/m, and Keytruda 200 mg IV every 3 weeks vs. single agent immunotherapy with Keytruda 200 mg IV every 3 weeks.  Dr. Arbutus Ped discussed that the K-ras G12 C mutation and oral treatment options including Caryn Section and Clyde Canterbury are approved in the second line setting after chemotherapy/immunotherapy. The patient is interested in proceeding with therapy  and immunotherapy.  He is expected to start her first dose of this treatment on 05/16/23 after his brain or SRS  We discussed the adverse side effects of treatment including but not limited to alopecia, myelosuppression, nausea and vomiting, peripheral neuropathy, liver or renal dysfunction as well as immunotherapy mediated adverse effects.   I will arrange for the patient to have a chemoeducation class prior to receiving her first cycle of chemotherapy.   We will arrange for the patient to have a B12 injection while in the clinic today.     I sent prescriptions for 1 mg folic acid p.o. daily as well as Compazine 10 mg every 6 hours as needed for nausea.  Will also taper his Decadron.  I sent a prescription for 2 mg twice daily for 7 days followed by 1 tablet daily for 7 days.  The patient will follow-up in 3 weeks for a one-week follow-up visit after completing her first cycle of chemotherapy.  Will arrange for a Port-A-Cath.  I have sent a prescription for Emla cream to the pharmacy  The patient was advised to call immediately if she has any concerning symptoms in the  interval. The patient voices understanding of current disease status and treatment options and is in agreement with the current care plan. All questions were answered. The patient knows to call the clinic with any problems, questions or concerns. We can certainly see the patient much sooner if necessary  No orders of the defined types were placed in this encounter.    Davarious Tumbleson L Alyah Boehning, PA-C 05/02/23  ADDENDUM: Hematology/Oncology Attending: I had a face-to-face encounter with the patient today.  I reviewed his record, lab, scan and recommended his care plan.  This is a very pleasant 62 years old white male recently diagnosed with a stage IV non-small cell lung cancer, adenocarcinoma diagnosed in June 2024 with positive KRAS G12C mutation and PD-L1 expression of 80%.  The patient is status post craniotomy with surgical resection of a large metastatic brain lesion on April 07, 2023.  He is also starting SRS to the resection cavity under the care of Matthew Rivera next week. He had a PET scan performed recently.  I personally and independently reviewed the scan and discussed the result with the patient and his wife. I explained to the patient that he has incurable condition and all the treatment will be of palliative nature.  He has a stage IV non-small cell lung cancer and I gave him the option of palliative care and hospice referral versus consideration of palliative systemic chemoimmunotherapy versus single agent immunotherapy.  The patient is interested in the most aggressive approach and he will be treated with carboplatin for AUC of 5, Alimta 500 Mg/M2 and Keytruda 200 Mg IV every 3 weeks.  He is expected to start the first cycle of this treatment on 05/16/2023 after completion of the SRS to the brain. I discussed with the patient the adverse effect of this treatment including but not limited to alopecia, myelosuppression, nausea and vomiting, peripheral neuropathy, liver or renal dysfunction as  well as immunotherapy adverse effects. The patient will receive vitamin B12 injection today and will call his pharmacy with prescription for Compazine, folic acid and Emla cream. He will have a Port-A-Cath placed before the first treatment. The patient will come back for follow-up visit 1 week after his treatment for evaluation and management of any adverse effect of his treatment. He was advised to call immediately if he has any other concerning  symptoms in the interval.

## 2023-05-02 NOTE — Telephone Encounter (Signed)
Rn called pt and family for meaningful use and nurse evaluation information. Pt doing well overall. Consult note completed and routed to Dr. Basilio Cairo and Quitman Livings PA-C for review.

## 2023-05-02 NOTE — Progress Notes (Signed)
Radiation Oncology         [phone]) [phone] ________________________________  Initial Outpatient Consultation  Name: Matthew Rivera MRN: [id number]  Date: 05/03/2023  DOB: Jun 08, 1961  GM:WNUUV, Lyman Bishop, MD  Si Gaul, MD   REFERRING PHYSICIAN: Si Gaul, MD  DIAGNOSIS:    ICD-10-CM   1. Metastasis to brain [facility])  C79.31     2. Claustrophobia  F40.240 LORazepam (ATIVAN) 0.5 MG tablet     Stage IV (T2b, N2, M1b) non-small cell lung cancer, adenocarcinoma diagnosed in June 2024. Presented with a large central left lung mass, left hilar and mediastinal lymphadenopathy, additional bilateral lung lesions, and solitary brain metastasis involving the inferior lateral right cerebellar hemisphere - s/p craniotomy and surgical resection in June 2024 under the care of [name]    Cancer Staging  Adenosquamous carcinoma of left lung [facility]) Staging form: Lung, AJCC 8th Edition - Clinical: Stage IVA (cT2b, cN2, cM1b) - Signed by Si Gaul, MD on 04/20/2023   CHIEF COMPLAINT: Here to discuss management of brain metastasis from a lung cancer primary  HISTORY OF PRESENT ILLNESS::Matthew Rivera is a 62 y.o. male who presented to the [facility] ED on 03/31/23 with headaches, nausea, vomiting, and dizziness.  -- CT of the head without contrast performed revealed a 2.2 cm mass within or along the inferolateral right cerebellar hemisphere. CT also showed prominent surrounding edema within the right cerebellar hemisphere, and associated mass effect with partial effacement of the fourth ventricle.  -- An MRI of the head was also performed and showed the 23 mm dural based mass along the posterior aspect of the right petrous temporal bone with associated dural tail, favored to represent a meningioma vs metastatic disease. Extensive vasogenic edema throughout the right cerebellar hemisphere with partial effacement of the fourth ventricle and mass effect on brainstem were again demonstrated.  -- CT  CAP performed further revealed: a 5 cm spiculated central left lobe lung mass consistent with primary lung neoplasm; necrotic nodal mass in the subcarinal station and left hilar region consistent with metastatic disease; a subpleural nodular density in the right lower lobe posteriorly possibly representing a metastatic lesion or an area of rounded atelectasis; and other small patchy subpleural densities noted in the superior segment of the right lower lobe and also in the left lower lobe. CT otherwise showed no evidence of metastatic disease involving the abdomen/pelvis or bony structures.  He was admitted overnight and cleared by neurosurgery, [name], for outpatient follow-up to discuss right suboccipital craniectomy for tumor resection. He was also discharged with Decadron.   Accordingly, the patient underwent right suboccipital craniotomy for tumor resection on 04/07/23 under the care of [name]. Pathology revealed metastatic poorly differentiated adenocarcinoma, consistent with a lung primary. Foundation one/PD-L1 testing showed a TPS score of 80%.   Subsequently, the patient was referred to Dr. Arbutus Ped on 04/20/23 for further management. During this visit, the patient endorsed ongoing fatigue, exertional SOB, and weakness in his left leg. He otherwise denied any headaches, visual changes, chest pain, cough, or hemoptysis. He also denied any weight changes or night sweats.   Staging work-up as recommended by Dr. Arbutus Ped is detailed as follows:  -- MRI of the brain on 04/28/23 demonstrated post-operative findings s/p cerebellar mass resection with essentially resolved edema in the cerebellar hemisphere. A thin curvilinear enhancement at the periphery of the resection cavity was also appreciated, favored to be postsurgical. No new mass lesions were appreciated otherwise.  -- PET scan on 04/29/23 demonstrated:  the dominant left lower lobe lung mass measuring 4.2 x 2.3 cm and a S.U.V. max of 5.1,  with thoracic nodal metastasis;  smaller bilateral hypermetabolic pulmonary nodules likely representing metastatic disease, including a 2.4 cm RLL nodule which possibly represents a synchronous primary; a focus of mild hypermetabolism within the posterior aspect of segment 4A of the liver without correlate, and favored to be related to hepatic altered perfusion. PET otherwise showed no evidence of hypermetabolic extrathoracic metastasis.   In terms of treatment options, Dr. Arbutus Ped as discussed the options of: palliative care, vs palliative systemic chemo-immunotherapy, vs immunotherapy as a single modality, vs treatment with targeted therapy if the patient has an actionable mutation. Dr. Arbutus Ped ultimately recommends waiting until we have the pending molecular study results before deciding on a definitve treatment option.   For his brain metastasis, he is currently on a tapered dose of Decadron and Dr. Arbutus Ped has given him a refill for the next 2-3 weeks. He has also recommended radiotherapy to the brain resection cavity if needed which we will discuss in detail today.  Smoking history: 1 pack/day for around 50 years and still smoking; trying to quit. He is currently using nicotine patches, which he states are helping with his cravings.   Patient experiences mild, intermittent headaches at the resection cavity. He denies any dizziness, nausea, visual or auditory disturbances, severe troubles with balance, or any other issues.  He feels that sometimes he waddles a little bit when walking  PREVIOUS RADIATION THERAPY: No  PAST MEDICAL HISTORY:  has a past medical history of Dyslipidemia, GERD (gastroesophageal reflux disease), Lung nodule seen on imaging study (03/31/2023), Osteoarthritis, and Smoker.    PAST SURGICAL HISTORY: Past Surgical History:  Procedure Laterality Date   BACK SURGERY     x 1   COLONOSCOPY WITH PROPOFOL N/A 08/18/2020   Procedure: COLONOSCOPY WITH PROPOFOL;  Surgeon: Lanelle Bal, DO;  Location: [facility];  Service: Endoscopy;  Laterality: N/A;  7:30am   CRANIOTOMY Right 04/07/2023   Procedure: Right suboccipital craniotomy for tumor resection;  Surgeon: Tressie Stalker, MD;  [facility];  Service: Neurosurgery;  Laterality: Right;  TO follow   MULTIPLE TOOTH EXTRACTIONS     wears full dentures   POLYPECTOMY  08/18/2020   Procedure: POLYPECTOMY INTESTINAL;  Surgeon: Lanelle Bal, DO;  Location: [facility];  Service: Endoscopy;;    FAMILY HISTORY: family history includes Asthma in his father; Diabetes in his father.  SOCIAL HISTORY:  reports that he has been smoking cigarettes. He has never used smokeless tobacco. He reports that he does not currently use alcohol. He reports that he does not use drugs.  ALLERGIES: Patient has no known allergies.  MEDICATIONS:  Current Outpatient Medications  Medication Sig Dispense Refill   dexamethasone (DECADRON) 4 MG tablet 1 tablet p.o. twice daily for 1 week then 1 tablet p.o. daily for 1 week then 0.5 tablet p.o. daily for 1 week then stop 30 tablet 0   HYDROcodone-acetaminophen (NORCO) 10-325 MG tablet Take 1 tablet by mouth every 4 (four) hours as needed for moderate pain.     LORazepam (ATIVAN) 0.5 MG tablet Take 1 tablet (0.5 mg total) by mouth as needed for anxiety. Take 1-2 tablet by mouth 20 minutes before MRI or radiation treatment. 12 tablet 0   Naphazoline-Pheniramine (VISINE OP) Place 1 drop into both eyes daily as needed (dry eyes).     nicotine (NICODERM CQ - DOSED IN MG/24 HOURS) 21 mg/24hr  patch Place 1 patch (21 mg total) onto the skin daily. 28 patch 0   OVER THE COUNTER MEDICATION Take 1 tablet by mouth daily as needed (constipation). Stool softner     pantoprazole (PROTONIX) 40 MG tablet Take 1 tablet (40 mg total) by mouth 2 (two) times daily. 60 tablet 2   tamsulosin (FLOMAX) 0.4 MG CAPS capsule Take 0.4 mg by mouth at bedtime as needed (urinary flow issues).     traMADol (ULTRAM)  50 MG tablet Take 50-100 mg by mouth 4 (four) times daily as needed for moderate pain.     zolpidem (AMBIEN) 10 MG tablet Take 10 mg by mouth at bedtime as needed for sleep.     acetaminophen-caffeine (EXCEDRIN TENSION HEADACHE) 500-65 MG TABS per tablet Take 2 tablets by mouth daily as needed (pain). (Patient not taking: Reported on 04/20/2023)     No current facility-administered medications for this encounter.    REVIEW OF SYSTEMS:  Notable for that above.   PHYSICAL EXAM:  height is 5\' 11"  (1.803 m) and weight is 147 lb 6.4 oz (66.9 kg). His oral temperature is 98.2 F (36.8 C). His blood pressure is 131/84 and his pulse is 68. His respiration is 18 and oxygen saturation is 100%.   General: Alert and oriented, in no acute distress  HEENT: Head is normocephalic. Extraocular movements are intact. Oropharynx is clear. Satisfactory healing at craniotomy incision site.  Neck: Neck is supple, no palpable cervical or supraclavicular lymphadenopathy. Heart: Regular in rate and rhythm with no murmurs, rubs, or gallops. Chest: Clear to auscultation bilaterally, with no rhonchi, wheezes, or rales. Abdomen: Soft, nontender, nondistended, with no rigidity or guarding. Extremities: No cyanosis or edema. Lymphatics: see Neck Exam Skin: No concerning lesions. Musculoskeletal: symmetric strength and muscle tone throughout. Neurologic: Cranial nerves II through XII are grossly intact. No obvious focalities. Speech is fluent. Coordination is intact. Sensation and discrimination grossly intact. Psychiatric: Judgment and insight are intact. Affect is appropriate.  KPS = 90  100 - Normal; no complaints; no evidence of disease. 90   - Able to carry on normal activity; minor signs or symptoms of disease. 80   - Normal activity with effort; some signs or symptoms of disease. 14   - Cares for self; unable to carry on normal activity or to do active work. 60   - Requires occasional assistance, but is able to  care for most of his personal needs. 50   - Requires considerable assistance and frequent medical care. 40   - Disabled; requires special care and assistance. 30   - Severely disabled; hospital admission is indicated although death not imminent. 20   - Very sick; hospital admission necessary; active supportive treatment necessary. 10   - Moribund; fatal processes progressing rapidly. 0     - Dead  Karnofsky DA, Abelmann WH, Craver LS and Burchenal [facility] [phone]) The use of the nitrogen mustards in the palliative treatment of carcinoma: with particular reference to bronchogenic carcinoma Cancer 1 [phone]   ECOG = 1  0 - Asymptomatic (Fully active, able to carry on all predisease activities without restriction)  1 - Symptomatic but completely ambulatory (Restricted in physically strenuous activity but ambulatory and able to carry out work of a light or sedentary nature. For example, light housework, office work)  2 - Symptomatic, <50% in bed during the day (Ambulatory and capable of all self care but unable to carry out any work activities. Up and about more than 50% of  waking hours)  3 - Symptomatic, >50% in bed, but not bedbound (Capable of only limited self-care, confined to bed or chair 50% or more of waking hours)  4 - Bedbound (Completely disabled. Cannot carry on any self-care. Totally confined to bed or chair)  5 - Death   Santiago Glad MM, Creech RH, Tormey DC, et al. [phone]). "Toxicity and response criteria of the [facility]". Am. Evlyn Clines. Oncol. 5 (6): 649-55     LABORATORY DATA:  Lab Results  Component Value Date   WBC 9.7 04/20/2023   HGB 14.3 04/20/2023   HCT 44.1 04/20/2023   MCV 93.0 04/20/2023   PLT 94 (L) 04/20/2023   CMP     Component Value Date/Time   NA 136 04/20/2023 1319   K 4.0 04/20/2023 1319   CL 101 04/20/2023 1319   CO2 31 04/20/2023 1319   GLUCOSE 118 (H) 04/20/2023 1319   BUN 17 04/20/2023 1319   CREATININE 0.90 04/20/2023 1319    CALCIUM 8.5 (L) 04/20/2023 1319   PROT 5.8 (L) 04/20/2023 1319   ALBUMIN 3.2 (L) 04/20/2023 1319   AST 15 04/20/2023 1319   ALT 45 (H) 04/20/2023 1319   ALKPHOS 56 04/20/2023 1319   BILITOT 1.0 04/20/2023 1319   GFRNONAA >60 04/20/2023 1319         RADIOGRAPHY: NM PET Image Initial (PI) Skull Base To Thigh (F-18 FDG)  Result Date: 04/29/2023 CLINICAL DATA:  Initial treatment strategy for non-small-cell lung cancer. Staging. Suboccipital craniotomy for tumor resection. EXAM: NUCLEAR MEDICINE PET SKULL BASE TO THIGH TECHNIQUE: 8.4 mCi F-18 FDG was injected intravenously. Full-ring PET imaging was performed from the skull base to thigh after the radiotracer. CT data was obtained and used for attenuation correction and anatomic localization. Fasting blood glucose: 125 mg/dl COMPARISON:  25/36/6440 chest abdomen and pelvic CTs. FINDINGS: Mediastinal blood pool activity: SUV max 1.7 Liver activity: SUV max NA NECK: No areas of abnormal hypermetabolism. Right posterior fossa photopenia consistent with suboccipital craniotomy resection. Incidental CT findings: No cervical adenopathy. CHEST: Left infrahilar, hilar, and subcarinal nodal metastasis. Index subcarinal node measures 2.0 cm and a S.U.V. max of 6.0 on 75/3. Suprahilar and infrahilar nodes are diminutive. Left lower lobe lung mass measures 4.2 x 2.3 cm and a S.U.V. max of 5.1 on 88/4. Dependent right lower lobe pulmonary nodule measures 2.4 cm and a S.U.V. max of 6.1 on 89/4. Left lower lobe pulmonary nodule measures 6 mm and a S.U.V. max of 2.9 on 84/4. Incidental CT findings: Deferred to recent diagnostic CT. A right lower lobe 5 mm pulmonary nodule is below PET resolution. Centrilobular emphysema. Aortic and coronary artery calcification. ABDOMEN/PELVIS: Posterior segment 4A mild hypermetabolism is without correlate on today's CT or the recent contrast-enhanced CT. Measures a S.U.V. max of 3.4. No abdominopelvic nodal hypermetabolism. Incidental CT  findings: Deferred to recent diagnostic CT. Abdominal aortic atherosclerosis. Normal adrenal glands. Large left hydrocele. SKELETON: No abnormal marrow activity. Incidental CT findings: None. IMPRESSION: 1. Dominant left lower lobe lung mass with thoracic nodal metastasis. 2. Smaller bilateral hypermetabolic pulmonary nodules are likely metastasis. The 2.4 cm right lower lobe nodule could represent a synchronous primary. 3. No hypermetabolic extrathoracic metastasis identified. 4. Focus of mild hypermetabolism within the posterior aspect of segment 4A of the liver is without correlate and favored to be related to hepatic altered perfusion. Recommend attention on follow-up. 5. Incidental findings, including: Coronary artery atherosclerosis. Aortic Atherosclerosis (ICD10-I70.0). Emphysema (ICD10-J43.9). Electronically Signed   By: Ronaldo Miyamoto  Reche Dixon M.D.   On: 04/29/2023 14:21   MR Brain W Wo Contrast  Result Date: 04/28/2023 CLINICAL DATA:  Brain metastases. EXAM: MRI HEAD WITHOUT AND WITH CONTRAST TECHNIQUE: Multiplanar, multiecho pulse sequences of the brain and surrounding structures were obtained without and with intravenous contrast. CONTRAST:  6mL GADAVIST GADOBUTROL 1 MMOL/ML IV SOLN COMPARISON:  Brain MRI 03/31/2023 FINDINGS: Brain: There has been interval right suboccipital craniotomy for mass resection. There is mild curvature linear enhancement at the periphery of the resection site but no residual masslike or nodular enhancement. There is a small amount of gliosis and possible ischemia (310-13) in the adjacent cerebellar parenchyma with associated postsurgical blood products, the extent of edema has markedly decreased compared to the preoperative study. There is no new mass lesion or other abnormal enhancement. There is no evidence of acute intracranial hemorrhage, extra-axial fluid collection, or acute territorial infarct. Background parenchymal volume is normal. The ventricles are normal in size.  Scattered foci of FLAIR signal abnormality in the supratentorial brain are again seen, consistent with chronic small-vessel ischemic change. The pituitary and suprasellar region are normal. There is no midline shift. Vascular: Normal flow voids. Skull and upper cervical spine: Postsurgical changes as above. There is no suspicious marrow signal abnormality. Sinuses/Orbits: The paranasal sinuses are clear. The globes and orbits are unremarkable. Other: There are small bilateral mastoid effusions. IMPRESSION: 1. Interval right suboccipital craniotomy for cerebellar mass resection with essentially resolved edema in the cerebellar hemisphere. 2. Thin curvilinear enhancement at the periphery of the resection cavity is favored postsurgical. Recommend attention on follow-up. 3. No new mass lesion. Electronically Signed   By: Lesia Hausen M.D.   On: 04/28/2023 15:19   DG O-ARM IMAGE ONLY/NO REPORT  Result Date: 04/07/2023 There is no Radiologist interpretation  for this exam.     IMPRESSION/PLAN: 62 yo with stage IV (T2b, N2, M1b) non-small cell lung cancer, adenocarcinoma with metastasis to the brain; s/p craniotomy and surgical resection in June 2024   I personally reviewed his MRI and PET scan  It was a pleasure meeting this patient today. He has been recovering well since his craniotomy. We discussed the nature of adenocarcinoma post-resection and the role radiotherapy plays in the treatment, specifically stereotactic radiosurgery. Stereotactic radiosurgery carries a higher likelihood for local tumor control at the targeted sites with lower associated risk for neurocognitive changes such as memory loss. However, the use of stereotactic radiosurgery in this setting may leave the patient at increased risk for new brain metastases elsewhere in the brain as high as 50-60%. Accordingly, patients who receive stereotactic radiosurgery in this setting should undergo ongoing surveillance imaging with brain MRI more  frequently in order to identify and treat new small brain metastases before they become symptomatic. Stereotactic radiosurgery does carry some different risks, including a risk of brain injury or radionecrosis. Patient expressed understanding of this treatment and would like to proceed.   His most recent 3T MRI was on 04/28/23 which we will use for assistance with treatment planning. Patient is scheduled for CT simulation on 05/06/23. Anticipate 3 fractions of SRS to resection cavity. We look forward to participating in this patient's care.  Patient was not able to tolerate MRI well. Prescription for Ativan sent to his local pharmacy to help him tolerate SRS planning and treatment. Patient knows to take 20 minutes before treatment and to have someone drive him to and from his appointments.   Today, I talked to the patient about the findings and work-up thus  far.  We discussed the patient's diagnosis of brain metastasis, post op, and general treatment for this, highlighting the role of post op SRS  in the management.  We discussed the available radiation techniques, and focused on the details of logistics and delivery.   We discussed the risks, benefits, and side effects of radiosurgery. Side effects may include but not necessarily be limited to: Brain injury, radiation necrosis, dizziness, headache, hair loss, skin irritation; no guarantees of treatment were given. A consent form was signed and placed in the patient's medical record. The patient was encouraged to ask questions that I answered to the best of my ability.  Simulation will be as scheduled.   On date of service, in total, I spent 60 minutes on this encounter. Patient was seen in person.   __________________________________________   Joyice Faster, PA-C   Lonie Peak, MD  This document serves as a record of services personally performed by Lonie Peak, MD. It was created on her behalf by Neena Rhymes, a trained medical scribe. The  creation of this record is based on the scribe's personal observations and the provider's statements to them. This document has been checked and approved by the attending provider.

## 2023-05-03 ENCOUNTER — Ambulatory Visit: Payer: 59

## 2023-05-03 ENCOUNTER — Other Ambulatory Visit: Payer: Self-pay

## 2023-05-03 ENCOUNTER — Ambulatory Visit
Admission: RE | Admit: 2023-05-03 | Discharge: 2023-05-03 | Disposition: A | Discharge status: Home / Self Care | Payer: 59 | Source: Ambulatory Visit | Attending: Radiation Oncology | Admitting: Radiation Oncology

## 2023-05-03 VITALS — BP 131/84 | HR 68 | Temp 98.2°F | Resp 18 | Ht 71.0 in | Wt 147.4 lb

## 2023-05-03 DIAGNOSIS — E785 Hyperlipidemia, unspecified: Secondary | ICD-10-CM | POA: Diagnosis not present

## 2023-05-03 DIAGNOSIS — R220 Localized swelling, mass and lump, head: Secondary | ICD-10-CM | POA: Diagnosis not present

## 2023-05-03 DIAGNOSIS — F40240 Claustrophobia: Secondary | ICD-10-CM

## 2023-05-03 DIAGNOSIS — Z7952 Long term (current) use of systemic steroids: Secondary | ICD-10-CM | POA: Diagnosis not present

## 2023-05-03 DIAGNOSIS — K219 Gastro-esophageal reflux disease without esophagitis: Secondary | ICD-10-CM | POA: Diagnosis not present

## 2023-05-03 DIAGNOSIS — C7931 Secondary malignant neoplasm of brain: Secondary | ICD-10-CM | POA: Diagnosis not present

## 2023-05-03 DIAGNOSIS — I700 Atherosclerosis of aorta: Secondary | ICD-10-CM | POA: Diagnosis not present

## 2023-05-03 DIAGNOSIS — M1990 Unspecified osteoarthritis, unspecified site: Secondary | ICD-10-CM | POA: Insufficient documentation

## 2023-05-03 DIAGNOSIS — F17210 Nicotine dependence, cigarettes, uncomplicated: Secondary | ICD-10-CM | POA: Diagnosis not present

## 2023-05-03 DIAGNOSIS — N433 Hydrocele, unspecified: Secondary | ICD-10-CM | POA: Insufficient documentation

## 2023-05-03 DIAGNOSIS — Z79899 Other long term (current) drug therapy: Secondary | ICD-10-CM | POA: Insufficient documentation

## 2023-05-03 DIAGNOSIS — R911 Solitary pulmonary nodule: Secondary | ICD-10-CM | POA: Insufficient documentation

## 2023-05-03 DIAGNOSIS — J432 Centrilobular emphysema: Secondary | ICD-10-CM | POA: Insufficient documentation

## 2023-05-03 DIAGNOSIS — I2510 Atherosclerotic heart disease of native coronary artery without angina pectoris: Secondary | ICD-10-CM | POA: Diagnosis not present

## 2023-05-03 DIAGNOSIS — C771 Secondary and unspecified malignant neoplasm of intrathoracic lymph nodes: Secondary | ICD-10-CM | POA: Diagnosis not present

## 2023-05-03 DIAGNOSIS — C3411 Malignant neoplasm of upper lobe, right bronchus or lung: Secondary | ICD-10-CM | POA: Insufficient documentation

## 2023-05-03 DIAGNOSIS — R609 Edema, unspecified: Secondary | ICD-10-CM | POA: Insufficient documentation

## 2023-05-03 MED ORDER — LORAZEPAM 0.5 MG PO TABS
0.5000 mg | ORAL_TABLET | ORAL | 0 refills | Status: DC | PRN
Start: 2023-05-03 — End: 2023-07-17

## 2023-05-03 NOTE — Progress Notes (Signed)
Has armband been applied?  Yes.    Does patient have an allergy to IV contrast dye?: No.   Has patient ever received premedication for IV contrast dye?: No.   Does patient take metformin?: No.  If patient does take metformin when was the last dose: na  Date of lab work: 04-20-23 BUN: 17 CR: 0.90 eGfr: >60   IV site: hand right, condition patent and no redness  Has IV site been added to flowsheet?  Yes.    BP 129/76 (BP Location: Left Arm, Patient Position: Sitting)   Pulse 68   Temp (!) 97.3 F (36.3 C) (Temporal)   Resp 18   Ht 5\' 11"  (1.803 m)   Wt 144 lb 8 oz (65.5 kg)   SpO2 100%   BMI 20.15 kg/m    This concludes the interaction.  Ruel Favors, LPN

## 2023-05-04 ENCOUNTER — Other Ambulatory Visit: Payer: Self-pay

## 2023-05-04 ENCOUNTER — Inpatient Hospital Stay (HOSPITAL_BASED_OUTPATIENT_CLINIC_OR_DEPARTMENT_OTHER): Payer: 59 | Admitting: Physician Assistant

## 2023-05-04 ENCOUNTER — Inpatient Hospital Stay: Payer: 59

## 2023-05-04 ENCOUNTER — Encounter: Payer: Self-pay | Admitting: Internal Medicine

## 2023-05-04 VITALS — BP 123/86 | HR 65 | Temp 97.7°F | Resp 13 | Wt 147.5 lb

## 2023-05-04 DIAGNOSIS — C3492 Malignant neoplasm of unspecified part of left bronchus or lung: Secondary | ICD-10-CM | POA: Diagnosis not present

## 2023-05-04 DIAGNOSIS — C3490 Malignant neoplasm of unspecified part of unspecified bronchus or lung: Secondary | ICD-10-CM | POA: Diagnosis not present

## 2023-05-04 LAB — CBC WITH DIFFERENTIAL (CANCER CENTER ONLY)
Abs Immature Granulocytes: 1.02 10*3/uL — ABNORMAL HIGH (ref 0.00–0.07)
Basophils Absolute: 0 10*3/uL (ref 0.0–0.1)
Basophils Relative: 1 %
Eosinophils Absolute: 0.2 10*3/uL (ref 0.0–0.5)
Eosinophils Relative: 2 %
HCT: 42.7 % (ref 39.0–52.0)
Hemoglobin: 13.9 g/dL (ref 13.0–17.0)
Immature Granulocytes: 13 %
Lymphocytes Relative: 13 %
Lymphs Abs: 1 10*3/uL (ref 0.7–4.0)
MCH: 29.8 pg (ref 26.0–34.0)
MCHC: 32.6 g/dL (ref 30.0–36.0)
MCV: 91.6 fL (ref 80.0–100.0)
Monocytes Absolute: 0.8 10*3/uL (ref 0.1–1.0)
Monocytes Relative: 10 %
Neutro Abs: 4.7 10*3/uL (ref 1.7–7.7)
Neutrophils Relative %: 61 %
Platelet Count: 157 10*3/uL (ref 150–400)
RBC: 4.66 MIL/uL (ref 4.22–5.81)
RDW: 15.4 % (ref 11.5–15.5)
Smear Review: NORMAL
WBC Count: 7.7 10*3/uL (ref 4.0–10.5)
nRBC: 0.4 % — ABNORMAL HIGH (ref 0.0–0.2)

## 2023-05-04 LAB — CMP (CANCER CENTER ONLY)
ALT: 40 U/L (ref 0–44)
AST: 13 U/L — ABNORMAL LOW (ref 15–41)
Albumin: 3.5 g/dL (ref 3.5–5.0)
Alkaline Phosphatase: 53 U/L (ref 38–126)
Anion gap: 5 (ref 5–15)
BUN: 19 mg/dL (ref 8–23)
CO2: 30 mmol/L (ref 22–32)
Calcium: 9 mg/dL (ref 8.9–10.3)
Chloride: 103 mmol/L (ref 98–111)
Creatinine: 0.92 mg/dL (ref 0.61–1.24)
GFR, Estimated: >60 mL/min (ref >60)
Glucose, Bld: 76 mg/dL (ref 70–99)
Potassium: 4 mmol/L (ref 3.5–5.1)
Sodium: 138 mmol/L (ref 135–145)
Total Bilirubin: 0.5 mg/dL (ref 0.3–1.2)
Total Protein: 5.7 g/dL — ABNORMAL LOW (ref 6.5–8.1)

## 2023-05-04 MED ORDER — PROCHLORPERAZINE MALEATE 10 MG PO TABS: 10.0000 mg | ORAL_TABLET | Freq: Four times a day (QID) | ORAL | 2 refills | Status: DC | PRN

## 2023-05-04 MED ORDER — FOLIC ACID 1 MG PO TABS: 1.0000 mg | ORAL_TABLET | Freq: Every day | ORAL | 2 refills | Status: DC

## 2023-05-04 MED ORDER — DEXAMETHASONE 2 MG PO TABS
ORAL_TABLET | ORAL | 0 refills | Status: DC
Start: 2023-05-04 — End: 2023-06-04

## 2023-05-04 MED ORDER — CYANOCOBALAMIN 1000 MCG/ML IJ SOLN
1000.0000 ug | Freq: Once | INTRAMUSCULAR | Status: AC
  Administered 2023-05-04: 1000 ug via INTRAMUSCULAR
  Filled 2023-05-04: qty 1

## 2023-05-04 MED ORDER — LIDOCAINE-PRILOCAINE 2.5-2.5 % EX CREA
1.0000 | TOPICAL_CREAM | CUTANEOUS | 2 refills | Status: DC | PRN
Start: 2023-05-04 — End: 2023-08-31

## 2023-05-04 MED ORDER — CYANOCOBALAMIN 1000 MCG/ML IJ SOLN: 1000.0000 ug | Freq: Once | INTRAMUSCULAR | Status: DC

## 2023-05-04 NOTE — Progress Notes (Signed)
This NN met with pt face to face today at his follow up appt with C.Heilingoetter, Onc PA and Dr.Mohamed. Pt was accompanied by his wife, Matthew Rivera. Plan for pt is to start treatment on 8/5. Pt will need port placed and chemo education class. NN will coordinate. This NN briefly discussed the placement of the port, preparation instructions, uses, etc. Pt understands that he will be coming in for labs weekly once his treatment starts. All questions answered at this time. Encouraged pt and Kim to reach out with any concerns.

## 2023-05-04 NOTE — Patient Instructions (Addendum)
Summary:  -There are two main categories of lung cancer, they are named based on the size of the cancer cell. One is called Non-Small cell lung cancer. The other type is Small Cell Lung Cancer -The sample (biopsy) that they took of your tumor was consistent with a subtype of Non-small cell lung cancer called Adenocarcinoma. This is the most common type of lung cancer.  -We covered a lot of important information at your appointment today regarding what the treatment plan is moving forward. Here are the the main points that were discussed at your office visit with [place] today:  -The treatment that you will receive consists of two chemotherapy drugs, called Carboplatin and Alimta (also called Pemetrexed) and one immunotherapy drug called Keytruda (pembrolizumab).  -We are planning on starting your treatment next week on /_/_ but before your start your treatment, I would like you to attend a Chemotherapy Education Class. This involves having you sit down with one of our nurse educators. She will discuss with your one-on-one more details about your treatment as well as general information about resources here at the cancer center.  -Your treatment will be given once every 3 weeks. We will check your labs once a week for the first ~5 treatments just to make sure that important components of your blood are in an acceptable range -We will get a CT scan after 3 treatments to check on the progress of treatment  Medications:  -I have sent a few important medication prescriptions to your pharmacy.  -Compazine was sent to your pharmacy. This medication is for nausea. You may take this every 6 hours as needed if you feel nauseous.  -I have also sent a prescription for 1 mg of folic acid to your pharmacy. We need you to take 1 tablet every day.  -We will administer vitamin B12 every 9 weeks while you are here in the clinic. You have received your first dose today.  -EMLA cream.   Referrals or Imaging: -Port.     Follow up:  -We will see you back for a follow up visit 1 week after your first treatment to see how it went and help manage any side effects of treatment that you may have   -If you need to reach [place] at any time, the main office number to the cancer center is [phone], when you call, ask to speak to either Cassie's or Dr. Asa Lente nurse.

## 2023-05-04 NOTE — Progress Notes (Signed)
START ON PATHWAY REGIMEN - Non-Small Cell Lung     A cycle is every 21 days:     Pembrolizumab      Pemetrexed      Carboplatin   **Always confirm dose/schedule in your pharmacy ordering system**  Patient Characteristics: Stage IV Metastatic, Nonsquamous, Molecular Analysis Completed, Molecular Alteration Present and Targeted Therapy Exhausted OR KRAS G12C+ or HER2+ Present and No Prior Chemo/Immunotherapy OR No Alteration Present, Initial Chemotherapy/Immunotherapy, PS =  0, 1, BRAF/MET/KRAS/HER2 Mutation Positive, Candidate for Immunotherapy, PD-L1 Expression Positive  ? 50% (TPS) and Immunotherapy Candidate Therapeutic Status: Stage IV Metastatic Histology: Nonsquamous Cell Broad Molecular Profiling Status: Animal nutritionist Analysis Results: KRAS G12C Mutation Present and No Prior Chemo/Immunotherapy ECOG Performance Status: 1 Chemotherapy/Immunotherapy Line of Therapy: Initial Chemotherapy/Immunotherapy Immunotherapy Candidate Status: Candidate for Immunotherapy PD-L1 Expression Status: PD-L1 Positive ? 50% (TPS) Intent of Therapy: Non-Curative / Palliative Intent, Discussed with Patient

## 2023-05-05 ENCOUNTER — Other Ambulatory Visit: Payer: Self-pay

## 2023-05-06 ENCOUNTER — Ambulatory Visit: Admission: RE | Admit: 2023-05-06 | Payer: 59 | Source: Ambulatory Visit

## 2023-05-06 ENCOUNTER — Encounter: Payer: Self-pay | Admitting: Internal Medicine

## 2023-05-06 ENCOUNTER — Other Ambulatory Visit: Payer: Self-pay

## 2023-05-06 ENCOUNTER — Ambulatory Visit
Admission: RE | Admit: 2023-05-06 | Discharge: 2023-05-06 | Disposition: A | Discharge status: Home / Self Care | Payer: 59 | Source: Ambulatory Visit | Attending: Radiation Oncology | Admitting: Radiation Oncology

## 2023-05-06 ENCOUNTER — Telehealth: Payer: Self-pay | Admitting: Internal Medicine

## 2023-05-06 VITALS — BP 129/76 | HR 68 | Temp 97.3°F | Resp 18 | Ht 71.0 in | Wt 144.5 lb

## 2023-05-06 DIAGNOSIS — C3492 Malignant neoplasm of unspecified part of left bronchus or lung: Secondary | ICD-10-CM

## 2023-05-06 DIAGNOSIS — C7931 Secondary malignant neoplasm of brain: Secondary | ICD-10-CM | POA: Diagnosis present

## 2023-05-06 DIAGNOSIS — Z510 Encounter for antineoplastic radiation therapy: Secondary | ICD-10-CM | POA: Insufficient documentation

## 2023-05-06 DIAGNOSIS — G9389 Other specified disorders of brain: Secondary | ICD-10-CM

## 2023-05-06 MED ORDER — FLUCONAZOLE 100 MG PO TABS
ORAL_TABLET | ORAL | 0 refills | Status: DC
Start: 2023-05-06 — End: 2023-06-04

## 2023-05-06 MED ORDER — SODIUM CHLORIDE 0.9% FLUSH
10.0000 mL | INTRAVENOUS | Status: DC | PRN
  Administered 2023-05-06: 10 mL via INTRAVENOUS

## 2023-05-06 NOTE — Progress Notes (Signed)
This NN reached out first to the pt, however no answer at his cell phone. LVM. NN then reached out to pt's wife, [name], who was able to take a call. Kim informed that pt's education appt has been scheduled for 9am on Friday 8/2. Kim verbalized understanding. NN also informed [name] that the pt's disability paperwork had been faxed to the NN on 7/24 and was taken to the [facility] FMLA/Disability office for processing and that the paperwork will be processed in 7-10 business days. The deadline for the return of the disability paperwork was 7/30, however NN suggested Kim or the pt reached out to [employer] and let them know they may need an extension to give [facility] time for the paperwork to be processed. Kim verbalized understanding. No additional questions at this time.

## 2023-05-06 NOTE — Telephone Encounter (Signed)
Scheduled per 07/24 los, patient has been called and voicemail was left.

## 2023-05-08 ENCOUNTER — Other Ambulatory Visit: Payer: Self-pay

## 2023-05-09 DIAGNOSIS — Z510 Encounter for antineoplastic radiation therapy: Secondary | ICD-10-CM | POA: Diagnosis not present

## 2023-05-09 NOTE — Progress Notes (Signed)
Pharmacist Chemotherapy Monitoring - Initial Assessment    Anticipated start date: 05/16/23   The following has been reviewed per standard work regarding the patient's treatment regimen: The patient's diagnosis, treatment plan and drug doses, and organ/hematologic function Lab orders and baseline tests specific to treatment regimen  The treatment plan start date, drug sequencing, and pre-medications Prior authorization status  Patient's documented medication list, including drug-drug interaction screen and prescriptions for anti-emetics and supportive care specific to the treatment regimen The drug concentrations, fluid compatibility, administration routes, and timing of the medications to be used The patient's access for treatment and lifetime cumulative dose history, if applicable  The patient's medication allergies and previous infusion related reactions, if applicable   Changes made to treatment plan:  N/A  Follow up needed:  Pending authorization for treatment    Matthew Rivera, [facility], 05/09/2023  2:29 PM

## 2023-05-10 ENCOUNTER — Other Ambulatory Visit: Payer: Self-pay

## 2023-05-10 ENCOUNTER — Ambulatory Visit (INDEPENDENT_AMBULATORY_CARE_PROVIDER_SITE_OTHER): Payer: 59 | Admitting: Internal Medicine

## 2023-05-10 ENCOUNTER — Ambulatory Visit
Admission: RE | Admit: 2023-05-10 | Discharge: 2023-05-10 | Disposition: A | Discharge status: Home / Self Care | Payer: 59 | Source: Ambulatory Visit | Attending: Radiation Oncology | Admitting: Radiation Oncology

## 2023-05-10 ENCOUNTER — Encounter: Payer: Self-pay | Admitting: Internal Medicine

## 2023-05-10 VITALS — BP 120/80 | HR 88 | Temp 98.3°F | Ht 71.0 in | Wt 148.4 lb

## 2023-05-10 DIAGNOSIS — J4489 Other specified chronic obstructive pulmonary disease: Secondary | ICD-10-CM

## 2023-05-10 DIAGNOSIS — F17210 Nicotine dependence, cigarettes, uncomplicated: Secondary | ICD-10-CM

## 2023-05-10 DIAGNOSIS — J209 Acute bronchitis, unspecified: Secondary | ICD-10-CM

## 2023-05-10 DIAGNOSIS — J439 Emphysema, unspecified: Secondary | ICD-10-CM

## 2023-05-10 DIAGNOSIS — Z510 Encounter for antineoplastic radiation therapy: Secondary | ICD-10-CM | POA: Diagnosis not present

## 2023-05-10 DIAGNOSIS — C3490 Malignant neoplasm of unspecified part of unspecified bronchus or lung: Secondary | ICD-10-CM

## 2023-05-10 LAB — RAD ONC ARIA SESSION SUMMARY
Course Elapsed Days: 0
Plan Fractions Treated to Date: 1
Plan Prescribed Dose Per Fraction: 9 Gy
Plan Total Fractions Prescribed: 3
Plan Total Prescribed Dose: 27 Gy
Reference Point Dosage Given to Date: 9 Gy
Reference Point Session Dosage Given: 9 Gy
Session Number: 1

## 2023-05-10 MED ORDER — NICOTINE 14 MG/24HR TD PT24: 14.0000 mg | MEDICATED_PATCH | Freq: Every day | TRANSDERMAL | 0 refills | Status: DC

## 2023-05-10 MED ORDER — STIOLTO RESPIMAT 2.5-2.5 MCG/ACT IN AERS: 2.0000 | INHALATION_SPRAY | Freq: Every day | RESPIRATORY_TRACT | 11 refills | Status: DC

## 2023-05-10 NOTE — Progress Notes (Signed)
Matthew Rivera rested with [place] for 15 minutes following his SRS treatment.  Patient denies headache, dizziness, nausea, diplopia or ringing in the ears. Denies fatigue. Patient without complaints. Understands to avoid strenuous activity for the next 24 hours and call [phone] with needs.    BP 118/88 (BP Location: Left Arm)   Pulse 69   Temp (!) 97.3 F (36.3 C) (Temporal)   Resp 18   SpO2 100%

## 2023-05-10 NOTE — Progress Notes (Signed)
The patient has been prescribed the inhaler stiolto. Inhaler technique was demonstrated to patient. The patient subsequently demonstrated correct technique.

## 2023-05-10 NOTE — Patient Instructions (Addendum)
Please schedule follow up scheduled with myself in 3 months.  If my schedule is not open yet, we will contact you with a reminder closer to that time. Please call [phone] if you haven't heard from [place] a month before.   Start stiolto 2 puffs in the morning.  Take the albuterol rescue inhaler every 4 to 6 hours as needed for wheezing or shortness of breath. You can also take it 15 minutes before exercise or exertional activity. Side effects include heart racing or pounding, jitters or anxiety. If you have a history of an irregular heart rhythm, it can make this worse. Can also give some patients a hard time sleeping.  Call me sooner if issues or concerns with your breathing.  I have sent nicotine patches to your pharmacy. Go from 21 to 14 to 7 to wean yourself off.   Understanding COPD   What is COPD? COPD stands for chronic obstructive pulmonary (lung) disease. COPD is a general term used for several lung diseases.  COPD is an umbrella term and encompasses other  common diseases in this group like chronic bronchitis and emphysema. Chronic asthma may also be included in this group. While some patients with COPD have only chronic bronchitis or emphysema, most patients have a combination of both.  You might hear these terms used in exchange for one another.   COPD adds to the work of the heart. Diseased lungs may reduce the amount of oxygen that goes to the blood. High blood pressure in blood vessels from the heart to the lungs makes it difficult for the heart to pump. Lung disease can also cause the body to produce too many red blood cells which may make the blood thicker and harder to pump.   Patients who have COPD with low oxygen levels may develop an enlarged heart (cor pulmonale). This condition weakens the heart and causes increased shortness of breath and swelling in the legs and feet.   Chronic bronchitis Chronic bronchitis is irritation and inflammation (swelling) of the lining in the  bronchial tubes (air passages). The irritation causes coughing and an excess amount of mucus in the airways. The swelling makes it difficult to get air in and out of the lungs. The small, hair-like structures on the inside of the airways (called cilia) may be damaged by the irritation. The cilia are then unable to help clean mucus from the airways.  Bronchitis is generally considered to be chronic when you have: a productive cough (cough up mucus) and shortness of breath that lasts about 3 months or more each year for 2 or more years in a row. Your doctor may define chronic bronchitis differently.   Emphysema Emphysema is the destruction, or breakdown, of the walls of the alveoli (air sacs) located at the end of the bronchial tubes. The damaged alveoli are not able to exchange oxygen and carbon dioxide between the lungs and the blood. The bronchioles lose their elasticity and collapse when you exhale, trapping air in the lungs. The trapped air keeps fresh air and oxygen from entering the lungs.   Who is affected by COPD? Emphysema and chronic bronchitis affect approximately 16 million people in the [place], or close to 11 percent of the population.   Symptoms of COPD  Shortness of breath  Shortness of breath with mild exercise (walking, using the stairs, etc.)  Chronic, productive cough (with mucus)  A feeling of "tightness" in the chest  Wheezing   What causes COPD? The two  primary causes of COPD are cigarette smoking and alpha1-antitrypsin (AAT) deficiency. Air pollution and occupational dusts may also contribute to COPD, especially when the person exposed to these substances is a cigarette smoker.  Cigarette smoke causes COPD by irritating the airways and creating inflammation that narrows the airways, making it more difficult to breathe. Cigarette smoke also causes the cilia to stop working properly so mucus and trapped particles are not cleaned from the airways. As a result, chronic  cough and excess mucus production develop, leading to chronic bronchitis.  In some people, chronic bronchitis and infections can lead to destruction of the small airways, or emphysema.  AAT deficiency, an inherited disorder, can also lead to emphysema. Alpha antitrypsin (AAT) is a protective material produced in the liver and transported to the lungs to help combat inflammation. When there is not enough of the chemical AAT, the body is no longer protected from an enzyme in the white blood cells.   How is COPD diagnosed?  To diagnose COPD, the physician needs to know: Do you smoke?  Have you had chronic exposure to dust or air pollutants?  Do other members of your family have lung disease?  Are you short of breath?  Do you get short of breath with exercise?  Do you have chronic cough and/or wheezing?  Do you cough up excess mucus?  To help with the diagnosis, the physician will conduct a thorough physical exam which includes:  Listening to your lungs and heart  Checking your blood pressure and pulse  Examining your nose and throat  Checking your feet and ankles for swelling   Laboratory and other tests Several laboratory and other tests are needed to confirm a diagnosis of COPD. These tests may include:  Chest X-ray to look for lung changes that could be caused by COPD   Spirometry and pulmonary function tests (PFTs) to determine lung volume and air flow  Pulse oximetry to measure the saturation of oxygen in the blood  Arterial blood gases (ABGs) to determine the amount of oxygen and carbon dioxide in the blood  Exercise testing to determine if the oxygen level in the blood drops during exercise   Treatment In the beginning stages of COPD, there is minimal shortness of breath that may be noticed only during exercise. As the disease progresses, shortness of breath may worsen and you may need to wear an oxygen device.   To help control other symptoms of COPD, the following treatments and  lifestyle changes may be prescribed.  Quitting smoking  Avoiding cigarette smoke and other irritants  Taking medications including: a. bronchodilators b. anti-inflammatory agents c. oxygen d. antibiotics  Maintaining a healthy diet  Following a structured exercise program such as pulmonary rehabilitation Preventing respiratory infections  Controlling stress   If your COPD progresses, you may be eligible to be evaluated for lung volume reduction surgery or lung transplantation. You may also be eligible to participate in certain clinical trials (research studies). Ask your health care providers about studies being conducted in your hospital.   What is the outlook? Although COPD can not be cured, its symptoms can be treated and your quality of life can be improved. Your prognosis or outlook for the future will depend on how well your lungs are functioning, your symptoms, and how well you respond to and follow your treatment plan.    What are the benefits of quitting smoking? Quitting smoking can lower your chances of getting or dying from heart disease, lung disease,  kidney failure, infection, or cancer. It can also lower your chances of getting osteoporosis, a condition that makes your bones weak. Plus, quitting smoking can help your skin look younger and reduce the chances that you will have problems with sex.  Quitting smoking will improve your health no matter how old you are, and no matter how long or how much you have smoked.  What should I do if I want to quit smoking? The letters in the word "START" can help you remember the steps to take: S = Set a quit date. T = Tell family, friends, and the people around you that you plan to quit. A = Anticipate or plan ahead for the tough times you'll face while quitting. R = Remove cigarettes and other tobacco products from your home, car, and work. T = Talk to your doctor about getting help to quit.  How can my doctor or nurse help? Your  doctor or nurse can give you advice on the best way to quit. He or she can also put you in touch with counselors or other people you can call for support. Plus, your doctor or nurse can give you medicines to: ?Reduce your craving for cigarettes ?Reduce the unpleasant symptoms that happen when you stop smoking (called "withdrawal symptoms"). You can also get help from a free phone line (1-800-QUIT-NOW) or go online to [employer].  What are the symptoms of withdrawal? The symptoms include: ?Trouble sleeping ?Being irritable, anxious or restless ?Getting frustrated or angry ?Having trouble thinking clearly  Some people who stop smoking become temporarily depressed. Some people need treatment for depression, such as counseling or antidepressant medicines. Depressed people might: ?No longer enjoy or care about doing the things they used to like to do ?Feel sad, down, hopeless, nervous, or cranky most of the day, almost every day ?Lose or gain weight ?Sleep too much or too little ?Feel tired or like they have no energy ?Feel guilty or like they are worth nothing ?Forget things or feel confused ?Move and speak more slowly than usual ?Act restless or have trouble staying still ?Think about death or suicide  If you think you might be depressed, see your doctor or nurse. Only someone trained in mental health can tell for sure if you are depressed. If you ever feel like you might hurt yourself, go straight to the nearest emergency department. Or you can call for an ambulance (in the [place] and [place], dial 9-1-1) or call your doctor or nurse right away and tell them it is an emergency. You can also reach the [place] National Suicide Prevention Lifeline at 248-671-1909 or http://hill.com/.  How do medicines help you stop smoking? Different medicines work in different ways: ?Nicotine replacement therapy eases withdrawal and reduces your body's craving for nicotine, the main drug  found in cigarettes. There are different forms of nicotine replacement, including skin patches, lozenges, gum, nasal sprays, and "puffers" or inhalers. Many can be bought without a prescription, while others might require one. ?Bupropion is a prescription medicine that reduces your desire to smoke. This medicine is sold under the brand names Zyban and Wellbutrin. It is also available in a generic version, which is cheaper than brand name medicines. ?Varenicline (brand names: Chantix, Champix) is a prescription medicine that reduces withdrawal symptoms and cigarette cravings. If you think you'd like to take varenicline and you have a history of depression, anxiety, or heart disease, discuss this with your doctor or nurse before taking the medicine. Varenicline can also increase  the effects of alcohol in some people. It's a good idea to limit drinking while you're taking it, at least until you know how it affects you.  How does counseling work? Counseling can happen during formal office visits or just over the phone. A counselor can help you: ?Figure out what triggers your smoking and what to do instead ?Overcome cravings ?Figure out what went wrong when you tried to quit before  What works best? Studies show that people have the best luck at quitting if they take medicines to help them quit and work with a Veterinary surgeon. It might also be helpful to combine nicotine replacement with one of the prescription medicines that help people quit. In some cases, it might even make sense to take bupropion and varenicline together.  What about e-cigarettes? Sometimes people wonder if using electronic cigarettes, or "e-cigarettes," might help them quit smoking. Using e-cigarettes is also called "vaping." Doctors do not recommend e-cigarettes in place of medicines and counseling. That's because e-cigarettes still contain nicotine as well as other substances that might be harmful. It's not clear how they can affect a  person's health in the long term.  Will I gain weight if I quit? Yes, you might gain a few pounds. But quitting smoking will have a much more positive effect on your health than weighing a few pounds more. Plus, you can help prevent some weight gain by being more active and eating less. Taking the medicine bupropion might help control weight gain.   What else can I do to improve my chances of quitting? You can: ?Start exercising. ?Stay away from smokers and places that you associate with smoking. If people close to you smoke, ask them to quit with you. ?Keep gum, hard candy, or something to put in your mouth handy. If you get a craving for a cigarette, try one of these instead. ?Don't give up, even if you start smoking again. It takes most people a few tries before they succeed.  What if I am pregnant and I smoke? If you are pregnant, it's really important for the health of your baby that you quit. Ask your doctor what options you have, and what is safest for your baby

## 2023-05-10 NOTE — Progress Notes (Signed)
Matthew Rivera    [id number]    04/08/1961  Primary Care Physician:Fusco, Lyman Bishop, MD  Referring Physician: [name], MD [address] [place],  [place] [address] Reason for Consultation: lung cancer, smoker Date of Consultation: 05/10/2023  Chief complaint:   Chief Complaint  Patient presents with   Consult    Pt referred by Dr.Jenkins (Neuro). Pt has brain mass.Pt here to discuss CT Scan (stage 4 lung cancer). Pt had radiation this morning for brain mass. Pt request Nicotine patches.     HPI: Matthew Rivera is a 62 y.o. man who presents for new patient visit. He had a brain tumor and underwent resection of this in June 2024 - was found to be primary adenocarcinoma of the lung. He has been undergoing whole brain radiation.  Plan is to proceed with chemotherapy as well with carboplatin, alimta and keytruda as his PDL1 expression is 80%.   Currently trying to quit smoking, using a nicotine patch.  He is not on any inhaler therapy. Of note he is on decadron currently for the brain metastases  Had pneumonia once in the 6th grade. No childhood asthma. Does have bronchitis occasionally - nothing in the last 5 years requiring hospitalization.   Played sports growing up, never had problems being winded. Now having fatigue and dyspnea with exertion, occasional wheezing. Cough without being productive.    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 Smoking history: current everyday smoker 1 ppd, trying to cut back  Social History   Occupational History   Not on file  Tobacco Use   Smoking status: Every Day    Current packs/day: 0.50    Average packs/day: 1.5 packs/day for 52.6 years (78.3 ttl pk-yrs)    Types: Cigarettes    Start date: 54   Smokeless tobacco: Never   Tobacco comments:    Cutting back and wears nicotine patch since 03/31/23.  KM  Vaping Use   Vaping status:  Never Used  Substance and Sexual Activity   Alcohol use: Not Currently    Comment: occas   Drug use: Never   Sexual activity: Yes    Relevant family history:  Family History  Problem Relation Age of Onset   Diabetes Father    Asthma Father     Past Medical History:  Diagnosis Date   Dyslipidemia    GERD (gastroesophageal reflux disease)    Lung nodule seen on imaging study 03/31/2023   seen on CT results   Osteoarthritis    Smoker    long term smoker 1 ppd    Past Surgical History:  Procedure Laterality Date   BACK SURGERY     x 1   COLONOSCOPY WITH PROPOFOL N/A 08/18/2020   Procedure: COLONOSCOPY WITH PROPOFOL;  Surgeon: Lanelle Bal, DO;  Location: [facility];  Service: Endoscopy;  Laterality: N/A;  7:30am   CRANIOTOMY Right 04/07/2023   Procedure: Right suboccipital craniotomy for tumor resection;  Surgeon: Tressie Stalker, MD;  Location[facility];  Service: Neurosurgery;  Laterality: Right;  TO follow   MULTIPLE TOOTH EXTRACTIONS     wears full dentures   POLYPECTOMY  08/18/2020   Procedure: POLYPECTOMY INTESTINAL;  Surgeon: Lanelle Bal, DO;  Location: [facility];  Service: Endoscopy;;     Physical Exam: Blood pressure 120/80, pulse 88, temperature 98.3 F (36.8 C), temperature source Oral, height 5'  11" (1.803 m), weight 148 lb 6.4 oz (67.3 kg), SpO2 98%. Gen:      No acute distress ENT:  no nasal polyps, mucus membranes moist Lungs:    diminished, No increased respiratory effort, symmetric chest wall excursion, clear to auscultation bilaterally, no wheezes or crackles CV:         Regular rate and rhythm; no murmurs, rubs, or gallops.  No pedal edema Abd:      + bowel sounds; soft, non-tender; no distension MSK: no acute synovitis of DIP or PIP joints, no mechanics hands.  Skin:      Warm and dry; no rashes Neuro: normal speech, no focal facial asymmetry Psych: alert and oriented x3, normal mood and affect   Data Reviewed/Medical Decision  Making:  Independent interpretation of tests: Imaging:  Review of patient's PET scan  images revealed left lower lobe mass with mediastinal and hilar adenopathy pet avid, . The patient's images have been independently reviewed by me.    PFTs:   Labs:  Lab Results  Component Value Date   NA 138 05/04/2023   K 4.0 05/04/2023   CO2 30 05/04/2023   GLUCOSE 76 05/04/2023   BUN 19 05/04/2023   CREATININE 0.92 05/04/2023   CALCIUM 9.0 05/04/2023   GFRNONAA >60 05/04/2023   Lab Results  Component Value Date   WBC 7.7 05/04/2023   HGB 13.9 05/04/2023   HCT 42.7 05/04/2023   MCV 91.6 05/04/2023   PLT 157 05/04/2023    Immunization status:   There is no immunization history on file for this patient.   I reviewed prior external note(s) from oncology  I reviewed the result(s) of the labs and imaging as noted above.   I have ordered PFT   Assessment:  Stage IV Lung cancer with brain metastases Emphysema, not on therapy Tobacco use disorder  Plan/Recommendations:  Start stiolto 2 puffs in the morning.  Take the albuterol rescue inhaler every 4 to 6 hours as needed for wheezing or shortness of breath.  Call me sooner if issues or concerns with your breathing. I have sent nicotine patches to your pharmacy. Go from 21 to 14 to 7 to wean yourself off.   We discussed disease management and progression at length today.   Smoking Cessation Counseling:  1. The patient is an everyday smoker and symptomatic due to the following condition lung cancer 2. The patient is currently contemplative in quitting smoking. 3. I advised patient to quit smoking. 4. We identified patient specific barriers to change.  5. I personally spent 3 minutes counseling the patient regarding tobacco use disorder. 6. We discussed management of stress and anxiety to help with smoking cessation, when applicable. 7. We discussed nicotine replacement therapy, Wellbutrin, Chantix as possible options. 8. I  advised setting a quit date. 9. Follow?up arranged with our office to continue ongoing discussions. 10.Resources given to patient including quit hotline.    Return to Care: Return in about 3 months (around 08/10/2023).  Durel Salts, MD Pulmonary and Critical Care Medicine [facility] [phone]  CC: [name], MD

## 2023-05-11 ENCOUNTER — Ambulatory Visit: Payer: 59 | Admitting: Radiation Oncology

## 2023-05-11 ENCOUNTER — Other Ambulatory Visit (HOSPITAL_COMMUNITY): Payer: Self-pay | Admitting: Student

## 2023-05-11 NOTE — Progress Notes (Signed)
NN reached out to [employer] [phone]) regarding pt's short term disability claim (#[id number]). Representative connected NN to [employer] case worker, Chanel. Chanel was not available at this time. NN LVM informing Chanel that pt's STD paperwork is still in process at [facility] and want to know if not having had the paperwork back by 7/30 will negatively impact his claim. NN currently awaiting call back from Chanel.

## 2023-05-12 ENCOUNTER — Ambulatory Visit (HOSPITAL_COMMUNITY)
Admission: RE | Admit: 2023-05-12 | Discharge: 2023-05-12 | Disposition: A | Discharge status: Home / Self Care | Payer: 59 | Source: Ambulatory Visit | Attending: Physician Assistant | Admitting: Physician Assistant

## 2023-05-12 ENCOUNTER — Telehealth: Payer: Self-pay

## 2023-05-12 DIAGNOSIS — C3492 Malignant neoplasm of unspecified part of left bronchus or lung: Secondary | ICD-10-CM | POA: Diagnosis not present

## 2023-05-12 DIAGNOSIS — G8929 Other chronic pain: Secondary | ICD-10-CM | POA: Diagnosis not present

## 2023-05-12 DIAGNOSIS — M549 Dorsalgia, unspecified: Secondary | ICD-10-CM | POA: Diagnosis not present

## 2023-05-12 DIAGNOSIS — F17210 Nicotine dependence, cigarettes, uncomplicated: Secondary | ICD-10-CM | POA: Diagnosis not present

## 2023-05-12 HISTORY — PX: IR IMAGING GUIDED PORT INSERTION: IMG5740

## 2023-05-12 MED ORDER — MIDAZOLAM HCL 2 MG/2ML IJ SOLN
INTRAMUSCULAR | Status: AC | PRN
Start: 2023-05-12 — End: 2023-05-12
  Administered 2023-05-12: 1 mg via INTRAVENOUS

## 2023-05-12 MED ORDER — MIDAZOLAM HCL 2 MG/2ML IJ SOLN
INTRAMUSCULAR | Status: AC
  Filled 2023-05-12: qty 2

## 2023-05-12 MED ORDER — HEPARIN SOD (PORK) LOCK FLUSH 100 UNIT/ML IV SOLN
INTRAVENOUS | Status: AC
  Filled 2023-05-12: qty 5

## 2023-05-12 MED ORDER — LIDOCAINE-EPINEPHRINE 1 %-1:100000 IJ SOLN
INTRAMUSCULAR | Status: AC
  Filled 2023-05-12: qty 1

## 2023-05-12 MED ORDER — FENTANYL CITRATE (PF) 100 MCG/2ML IJ SOLN
INTRAMUSCULAR | Status: AC
  Filled 2023-05-12: qty 2

## 2023-05-12 MED ORDER — SODIUM CHLORIDE 0.9 % IV SOLN: INTRAVENOUS | Status: DC

## 2023-05-12 MED ORDER — FENTANYL CITRATE (PF) 100 MCG/2ML IJ SOLN
INTRAMUSCULAR | Status: AC | PRN
Start: 2023-05-12 — End: 2023-05-12
  Administered 2023-05-12: 50 ug via INTRAVENOUS

## 2023-05-12 NOTE — Procedures (Signed)
Interventional Radiology Procedure Note  Procedure: Placement of a right IJ approach single lumen PowerPort.  Tip is positioned at the superior cavoatrial junction and catheter is ready for immediate use.  Complications: None Recommendations:  - Ok to shower tomorrow - Do not submerge for 7 days - Routine line care   Signed,  Jaime S. Wagner, DO   

## 2023-05-12 NOTE — Telephone Encounter (Signed)
Left message for Matthew Rivera informing him that his document was completed and that we need to get his signature for a release of information. Requested he stop by office after his appointment on 05/13/23.

## 2023-05-12 NOTE — H&P (Addendum)
Chief Complaint: Patient was seen in consultation today for port-a-catheter placement   Referring Physician(s): Heilingoetter,Cassandra L  Supervising Physician: Dr. Loreta Ave  Patient Status: [facility] - Out-pt  History of Present Illness: Matthew Rivera is a 62 y.o. male with a medical history significant for chronic back pain from osteoarthritis and tobacco use. He presented to the ED June 2024 with complaints of headaches, dizziness, nausea and vomiting. Work up in the ED revealed a brain mass with swelling, mass effect and midline shift. Additional imaging demonstrated a lung tumor. He was evaluated by Neurosurgery who took him to the OR 04/07/23 for a right suboccipital craniotomy for gross total resection of the cerebellar tumor. The pathology showed metastatic adenocarcinoma consistent with lung primary. His oncology team is preparing him for chemotherapy and he will require durable venous access.   Interventional Radiology has been asked to evaluate this patient for an image-guide  Past Medical History:  Diagnosis Date   Dyslipidemia    GERD (gastroesophageal reflux disease)    Lung nodule seen on imaging study 03/31/2023   seen on CT results   Osteoarthritis    Smoker    long term smoker 1 ppd    Past Surgical History:  Procedure Laterality Date   BACK SURGERY     x 1   COLONOSCOPY WITH PROPOFOL N/A 08/18/2020   Procedure: COLONOSCOPY WITH PROPOFOL;  Surgeon: Lanelle Bal, DO;  Location: [facility];  Service: Endoscopy;  Laterality: N/A;  7:30am   CRANIOTOMY Right 04/07/2023   Procedure: Right suboccipital craniotomy for tumor resection;  Surgeon: Tressie Stalker, MD;  Location: [facility] OR;  Service: Neurosurgery;  Laterality: Right;  TO follow   MULTIPLE TOOTH EXTRACTIONS     wears full dentures   POLYPECTOMY  08/18/2020   Procedure: POLYPECTOMY INTESTINAL;  Surgeon: Lanelle Bal, DO;  Location: [facility];  Service: Endoscopy;;    Allergies: Patient has no  known allergies.  Medications: Prior to Admission medications   Medication Sig Start Date End Date Taking? Authorizing Provider  dexamethasone (DECADRON) 2 MG tablet Take 1 tablet twice a day for 1 week, followed by 1 tablet daily for 1 week, then stop 05/04/23   Heilingoetter, Cassandra L, PA-C  fluconazole (DIFLUCAN) 100 MG tablet Take 2 tablets today, then 1 tablet daily x 20 more days. Do not take any statin medications for cholesterol while on this. 05/06/23   Lonie Peak, MD  folic acid (FOLVITE) 1 MG tablet Take 1 tablet (1 mg total) by mouth daily. 05/04/23   Heilingoetter, Cassandra L, PA-C  HYDROcodone-acetaminophen (NORCO) 10-325 MG tablet Take 1 tablet by mouth every 4 (four) hours as needed for moderate pain.    [provider]  lidocaine-prilocaine (EMLA) cream Apply 1 Application topically as needed. 05/04/23   Heilingoetter, Cassandra L, PA-C  LORazepam (ATIVAN) 0.5 MG tablet Take 1 tablet (0.5 mg total) by mouth as needed for anxiety. Take 1-2 tablet by mouth 20 minutes before MRI or radiation treatment. 05/03/23   Erven Colla, PA-C  Naphazoline-Pheniramine (VISINE OP) Place 1 drop into both eyes daily as needed (dry eyes).    [provider]  nicotine (NICODERM CQ - DOSED IN MG/24 HOURS) 14 mg/24hr patch Place 1 patch (14 mg total) onto the skin daily. 05/10/23   Charlott Holler, MD  OVER THE COUNTER MEDICATION Take 1 tablet by mouth daily as needed (constipation). Stool softner    [provider]  pantoprazole (PROTONIX) 40 MG tablet Take  1 tablet (40 mg total) by mouth 2 (two) times daily. 04/01/23   Rhetta Mura, MD  prochlorperazine (COMPAZINE) 10 MG tablet Take 1 tablet (10 mg total) by mouth every 6 (six) hours as needed. 05/04/23   Heilingoetter, Cassandra L, PA-C  tamsulosin (FLOMAX) 0.4 MG CAPS capsule Take 0.4 mg by mouth at bedtime as needed (urinary flow issues). 03/01/23   [provider]  Tiotropium Bromide-Olodaterol (STIOLTO  RESPIMAT) 2.5-2.5 MCG/ACT AERS Inhale 2 puffs into the lungs daily. 05/10/23   Charlott Holler, MD  traMADol (ULTRAM) 50 MG tablet Take 50-100 mg by mouth 4 (four) times daily as needed for moderate pain. 07/11/20   [provider]  zolpidem (AMBIEN) 10 MG tablet Take 10 mg by mouth at bedtime as needed for sleep. 12/22/22   [provider]     Family History  Problem Relation Age of Onset   Diabetes Father    Asthma Father     Social History   Socioeconomic History   Marital status: Married    Spouse name: Not on file   Number of children: Not on file   Years of education: Not on file   Highest education level: Not on file  Occupational History   Not on file  Tobacco Use   Smoking status: Every Day    Current packs/day: 0.50    Average packs/day: 1.5 packs/day for 52.6 years (78.3 ttl pk-yrs)    Types: Cigarettes    Start date: 56   Smokeless tobacco: Never   Tobacco comments:    Cutting back and wears nicotine patch since 03/31/23.  KM  Vaping Use   Vaping status: Never Used  Substance and Sexual Activity   Alcohol use: Not Currently    Comment: occas   Drug use: Never   Sexual activity: Yes  Other Topics Concern   Not on file  Social History Narrative   Not on file   Social Determinants of Health   Financial Resource Strain: Not on file  Food Insecurity: No Food Insecurity (03/31/2023)   Hunger Vital Sign    Worried About Running Out of Food in the Last Year: Never true    Ran Out of Food in the Last Year: Never true  Transportation Needs: No Transportation Needs (03/31/2023)   PRAPARE - Administrator, Civil Service (Medical): No    Lack of Transportation (Non-Medical): No  Physical Activity: Not on file  Stress: Not on file  Social Connections: Not on file    Review of Systems: A 12 point ROS discussed and pertinent positives are indicated in the HPI above.  All other systems are negative.  Review of Systems  Constitutional:   Negative for appetite change and fatigue.  Respiratory:  Positive for shortness of breath. Negative for cough.   Cardiovascular:  Negative for chest pain and leg swelling.  Gastrointestinal:  Negative for abdominal pain, diarrhea, nausea and vomiting.  Neurological:  Negative for dizziness and headaches.    Vital Signs: BP 136/84   Pulse 74   Temp 98.2 F (36.8 C) (Oral)   Resp 18   Ht 5\' 11"  (1.803 m)   Wt 147 lb (66.7 kg)   SpO2 99%   BMI 20.50 kg/m   Physical Exam Constitutional:      General: He is not in acute distress.    Appearance: He is not ill-appearing.  HENT:     Mouth/Throat:     Mouth: Mucous membranes are moist.  Pharynx: Oropharynx is clear.  Cardiovascular:     Rate and Rhythm: Normal rate.     Pulses: Normal pulses.  Pulmonary:     Effort: Pulmonary effort is normal.  Abdominal:     Palpations: Abdomen is soft.     Tenderness: There is no abdominal tenderness.  Neurological:     Mental Status: He is alert and oriented to person, place, and time.  Psychiatric:        Mood and Affect: Mood normal.        Behavior: Behavior normal.        Thought Content: Thought content normal.        Judgment: Judgment normal.     Imaging: NM PET Image Initial (PI) Skull Base To Thigh (F-18 FDG)  Result Date: 04/29/2023 CLINICAL DATA:  Initial treatment strategy for non-small-cell lung cancer. Staging. Suboccipital craniotomy for tumor resection. EXAM: NUCLEAR MEDICINE PET SKULL BASE TO THIGH TECHNIQUE: 8.4 mCi F-18 FDG was injected intravenously. Full-ring PET imaging was performed from the skull base to thigh after the radiotracer. CT data was obtained and used for attenuation correction and anatomic localization. Fasting blood glucose: 125 mg/dl COMPARISON:  09/81/1914 chest abdomen and pelvic CTs. FINDINGS: Mediastinal blood pool activity: SUV max 1.7 Liver activity: SUV max NA NECK: No areas of abnormal hypermetabolism. Right posterior fossa photopenia  consistent with suboccipital craniotomy resection. Incidental CT findings: No cervical adenopathy. CHEST: Left infrahilar, hilar, and subcarinal nodal metastasis. Index subcarinal node measures 2.0 cm and a S.U.V. max of 6.0 on 75/3. Suprahilar and infrahilar nodes are diminutive. Left lower lobe lung mass measures 4.2 x 2.3 cm and a S.U.V. max of 5.1 on 88/4. Dependent right lower lobe pulmonary nodule measures 2.4 cm and a S.U.V. max of 6.1 on 89/4. Left lower lobe pulmonary nodule measures 6 mm and a S.U.V. max of 2.9 on 84/4. Incidental CT findings: Deferred to recent diagnostic CT. A right lower lobe 5 mm pulmonary nodule is below PET resolution. Centrilobular emphysema. Aortic and coronary artery calcification. ABDOMEN/PELVIS: Posterior segment 4A mild hypermetabolism is without correlate on today's CT or the recent contrast-enhanced CT. Measures a S.U.V. max of 3.4. No abdominopelvic nodal hypermetabolism. Incidental CT findings: Deferred to recent diagnostic CT. Abdominal aortic atherosclerosis. Normal adrenal glands. Large left hydrocele. SKELETON: No abnormal marrow activity. Incidental CT findings: None. IMPRESSION: 1. Dominant left lower lobe lung mass with thoracic nodal metastasis. 2. Smaller bilateral hypermetabolic pulmonary nodules are likely metastasis. The 2.4 cm right lower lobe nodule could represent a synchronous primary. 3. No hypermetabolic extrathoracic metastasis identified. 4. Focus of mild hypermetabolism within the posterior aspect of segment 4A of the liver is without correlate and favored to be related to hepatic altered perfusion. Recommend attention on follow-up. 5. Incidental findings, including: Coronary artery atherosclerosis. Aortic Atherosclerosis (ICD10-I70.0). Emphysema (ICD10-J43.9). Electronically Signed   By: Jeronimo Greaves M.D.   On: 04/29/2023 14:21   MR Brain W Wo Contrast  Result Date: 04/28/2023 CLINICAL DATA:  Brain metastases. EXAM: MRI HEAD WITHOUT AND WITH  CONTRAST TECHNIQUE: Multiplanar, multiecho pulse sequences of the brain and surrounding structures were obtained without and with intravenous contrast. CONTRAST:  6mL GADAVIST GADOBUTROL 1 MMOL/ML IV SOLN COMPARISON:  Brain MRI 03/31/2023 FINDINGS: Brain: There has been interval right suboccipital craniotomy for mass resection. There is mild curvature linear enhancement at the periphery of the resection site but no residual masslike or nodular enhancement. There is a small amount of gliosis and possible ischemia (310-13) in the adjacent  cerebellar parenchyma with associated postsurgical blood products, the extent of edema has markedly decreased compared to the preoperative study. There is no new mass lesion or other abnormal enhancement. There is no evidence of acute intracranial hemorrhage, extra-axial fluid collection, or acute territorial infarct. Background parenchymal volume is normal. The ventricles are normal in size. Scattered foci of FLAIR signal abnormality in the supratentorial brain are again seen, consistent with chronic small-vessel ischemic change. The pituitary and suprasellar region are normal. There is no midline shift. Vascular: Normal flow voids. Skull and upper cervical spine: Postsurgical changes as above. There is no suspicious marrow signal abnormality. Sinuses/Orbits: The paranasal sinuses are clear. The globes and orbits are unremarkable. Other: There are small bilateral mastoid effusions. IMPRESSION: 1. Interval right suboccipital craniotomy for cerebellar mass resection with essentially resolved edema in the cerebellar hemisphere. 2. Thin curvilinear enhancement at the periphery of the resection cavity is favored postsurgical. Recommend attention on follow-up. 3. No new mass lesion. Electronically Signed   By: Lesia Hausen M.D.   On: 04/28/2023 15:19    Labs:  CBC: Recent Labs    03/31/23 0916 04/01/23 0703 04/20/23 1319 05/04/23 0740  WBC 6.9 6.8 9.7 7.7  HGB 16.5 13.6 14.3  13.9  HCT 50.6 42.4 44.1 42.7  PLT 228 202 94* 157    COAGS: No results for input(s): "INR", "APTT" in the last 8760 hours.  BMP: Recent Labs    03/31/23 0916 04/01/23 0703 04/20/23 1319 05/04/23 0740  NA 135 139 136 138  K 4.0 4.0 4.0 4.0  CL 100 106 101 103  CO2 24 22 31 30   GLUCOSE 107* 135* 118* 76  BUN 18 14 17 19   CALCIUM 9.5 8.7* 8.5* 9.0  CREATININE 0.87 0.90 0.90 0.92  GFRNONAA >60 >60 >60 >60    LIVER FUNCTION TESTS: Recent Labs    04/20/23 1319 05/04/23 0740  BILITOT 1.0 0.5  AST 15 13*  ALT 45* 40  ALKPHOS 56 53  PROT 5.8* 5.7*  ALBUMIN 3.2* 3.5    TUMOR MARKERS: No results for input(s): "AFPTM", "CEA", "CA199", "CHROMGRNA" in the last 8760 hours.  Assessment and Plan:  Non-small cell lung cancer; pending chemotherapy: Teja Arreola. Freida Busman, 62 year old male, presents today to the [facility] Interventional Radiology department for an image-guided port-a-catheter placement.   Risks and benefits of image-guided Port-a-catheter placement were discussed with the patient including, but not limited to bleeding, infection, pneumothorax, or fibrin sheath development and need for additional procedures.  All of the patient's questions were answered, patient is agreeable to proceed. He has been NPO. He is a full code.   Consent signed and in chart.  Thank you for this interesting consult.  I greatly enjoyed meeting HELIO TEDFORD and look forward to participating in their care.  A copy of this report was sent to the requesting provider on this date.  Electronically Signed: Alwyn Ren, AGACNP-BC [phone] 05/12/2023, 11:12 AM   I spent a total of  30 Minutes   in face to face in clinical consultation, greater than 50% of which was counseling/coordinating care for port-a-catheter placement.

## 2023-05-13 ENCOUNTER — Encounter: Payer: Self-pay | Admitting: Internal Medicine

## 2023-05-13 ENCOUNTER — Ambulatory Visit
Admission: RE | Admit: 2023-05-13 | Discharge: 2023-05-13 | Disposition: A | Discharge status: Home / Self Care | Payer: 59 | Source: Ambulatory Visit | Attending: Radiation Oncology | Admitting: Radiation Oncology

## 2023-05-13 ENCOUNTER — Other Ambulatory Visit: Payer: Self-pay

## 2023-05-13 ENCOUNTER — Inpatient Hospital Stay: Payer: 59

## 2023-05-13 ENCOUNTER — Ambulatory Visit: Payer: 59

## 2023-05-13 DIAGNOSIS — C771 Secondary and unspecified malignant neoplasm of intrathoracic lymph nodes: Secondary | ICD-10-CM | POA: Insufficient documentation

## 2023-05-13 DIAGNOSIS — C3490 Malignant neoplasm of unspecified part of unspecified bronchus or lung: Secondary | ICD-10-CM | POA: Insufficient documentation

## 2023-05-13 DIAGNOSIS — C3492 Malignant neoplasm of unspecified part of left bronchus or lung: Secondary | ICD-10-CM | POA: Diagnosis not present

## 2023-05-13 DIAGNOSIS — Z79899 Other long term (current) drug therapy: Secondary | ICD-10-CM | POA: Insufficient documentation

## 2023-05-13 DIAGNOSIS — Z510 Encounter for antineoplastic radiation therapy: Secondary | ICD-10-CM | POA: Diagnosis present

## 2023-05-13 DIAGNOSIS — Z87891 Personal history of nicotine dependence: Secondary | ICD-10-CM | POA: Insufficient documentation

## 2023-05-13 DIAGNOSIS — Z5111 Encounter for antineoplastic chemotherapy: Secondary | ICD-10-CM | POA: Insufficient documentation

## 2023-05-13 DIAGNOSIS — Z7952 Long term (current) use of systemic steroids: Secondary | ICD-10-CM | POA: Insufficient documentation

## 2023-05-13 DIAGNOSIS — Z5112 Encounter for antineoplastic immunotherapy: Secondary | ICD-10-CM | POA: Insufficient documentation

## 2023-05-13 DIAGNOSIS — C7931 Secondary malignant neoplasm of brain: Secondary | ICD-10-CM | POA: Insufficient documentation

## 2023-05-13 DIAGNOSIS — E860 Dehydration: Secondary | ICD-10-CM | POA: Insufficient documentation

## 2023-05-13 LAB — RAD ONC ARIA SESSION SUMMARY
Course Elapsed Days: 3
Plan Fractions Treated to Date: 2
Plan Prescribed Dose Per Fraction: 9 Gy
Plan Total Fractions Prescribed: 3
Plan Total Prescribed Dose: 27 Gy
Reference Point Dosage Given to Date: 18 Gy
Reference Point Session Dosage Given: 9 Gy
Session Number: 2

## 2023-05-13 MED FILL — Dexamethasone Sodium Phosphate Inj 100 MG/10ML: INTRAMUSCULAR | Qty: 1 | Status: AC

## 2023-05-13 MED FILL — Fosaprepitant Dimeglumine For IV Infusion 150 MG (Base Eq): INTRAVENOUS | Qty: 5 | Status: AC

## 2023-05-13 NOTE — Progress Notes (Signed)
Nurse monitoring complete status post 2 of 3 SRS treatments. Patient rested with [place] for 15 minutes and is without complaints. Patient denies new or worsening neurologic symptoms. Vitals stable. Instructed patient to avoid strenuous activity for the next 24 hours.  Instructed patient to call [phone] with needs related to treatment after hours or over the weekend. Patient verbalized understanding. Ambulated out of clinic unassisted without incidence.   Vitals:   05/13/23 1212  BP: 131/79  Pulse: 66  Resp: 20  Temp: 97.8 F (36.6 C)  SpO2: 100%

## 2023-05-13 NOTE — Progress Notes (Signed)
Received call from patient's spouse after receiving my card per my request.  Introduced myself as Dance movement psychotherapist and to offer available resources. Discussed one-time $1000 Marketing executive to assist with personal expenses while going through treatment. Advised what is needed to apply. She verbalized understanding and will give me a call when she has paperwork together to coordinate grant completion steps. Also discussed [employer] whom provides assistance to cancer patients whom live in [place] only. Advised application will be available when he completes grant paperwork. She verbalized understanding.  They have my card for any additional financial questions or concerns.

## 2023-05-14 ENCOUNTER — Other Ambulatory Visit: Payer: Self-pay

## 2023-05-16 ENCOUNTER — Encounter: Payer: Self-pay | Admitting: Internal Medicine

## 2023-05-16 ENCOUNTER — Other Ambulatory Visit: Payer: Self-pay

## 2023-05-16 ENCOUNTER — Other Ambulatory Visit: Payer: 59

## 2023-05-16 ENCOUNTER — Ambulatory Visit: Payer: 59 | Admitting: Internal Medicine

## 2023-05-16 ENCOUNTER — Ambulatory Visit
Admission: RE | Admit: 2023-05-16 | Discharge: 2023-05-16 | Disposition: A | Discharge status: Home / Self Care | Payer: 59 | Source: Ambulatory Visit | Attending: Radiation Oncology | Admitting: Radiation Oncology

## 2023-05-16 ENCOUNTER — Telehealth: Payer: Self-pay

## 2023-05-16 ENCOUNTER — Inpatient Hospital Stay: Payer: 59

## 2023-05-16 VITALS — BP 120/84 | HR 65 | Resp 19 | Wt 148.8 lb

## 2023-05-16 DIAGNOSIS — C3492 Malignant neoplasm of unspecified part of left bronchus or lung: Secondary | ICD-10-CM

## 2023-05-16 DIAGNOSIS — Z510 Encounter for antineoplastic radiation therapy: Secondary | ICD-10-CM | POA: Diagnosis not present

## 2023-05-16 DIAGNOSIS — Z95828 Presence of other vascular implants and grafts: Secondary | ICD-10-CM

## 2023-05-16 LAB — CBC WITH DIFFERENTIAL (CANCER CENTER ONLY)
Abs Immature Granulocytes: 0.62 10*3/uL — ABNORMAL HIGH (ref 0.00–0.07)
Basophils Absolute: 0 10*3/uL (ref 0.0–0.1)
Basophils Relative: 0 %
Eosinophils Absolute: 0.1 10*3/uL (ref 0.0–0.5)
Eosinophils Relative: 0 %
HCT: 39.3 % (ref 39.0–52.0)
Hemoglobin: 13.2 g/dL (ref 13.0–17.0)
Immature Granulocytes: 5 %
Lymphocytes Relative: 8 %
Lymphs Abs: 0.9 10*3/uL (ref 0.7–4.0)
MCH: 30.9 pg (ref 26.0–34.0)
MCHC: 33.6 g/dL (ref 30.0–36.0)
MCV: 92 fL (ref 80.0–100.0)
Monocytes Absolute: 0.6 10*3/uL (ref 0.1–1.0)
Monocytes Relative: 5 %
Neutro Abs: 9.2 10*3/uL — ABNORMAL HIGH (ref 1.7–7.7)
Neutrophils Relative %: 82 %
Platelet Count: 112 10*3/uL — ABNORMAL LOW (ref 150–400)
RBC: 4.27 MIL/uL (ref 4.22–5.81)
RDW: 17.1 % — ABNORMAL HIGH (ref 11.5–15.5)
WBC Count: 11.5 10*3/uL — ABNORMAL HIGH (ref 4.0–10.5)
nRBC: 0 % (ref 0.0–0.2)

## 2023-05-16 LAB — CMP (CANCER CENTER ONLY)
ALT: 29 U/L (ref 0–44)
AST: 13 U/L — ABNORMAL LOW (ref 15–41)
Albumin: 3.6 g/dL (ref 3.5–5.0)
Alkaline Phosphatase: 56 U/L (ref 38–126)
Anion gap: 5 (ref 5–15)
BUN: 17 mg/dL (ref 8–23)
CO2: 29 mmol/L (ref 22–32)
Calcium: 8.7 mg/dL — ABNORMAL LOW (ref 8.9–10.3)
Chloride: 102 mmol/L (ref 98–111)
Creatinine: 0.8 mg/dL (ref 0.61–1.24)
GFR, Estimated: >60 mL/min (ref >60)
Glucose, Bld: 120 mg/dL — ABNORMAL HIGH (ref 70–99)
Potassium: 4.2 mmol/L (ref 3.5–5.1)
Sodium: 136 mmol/L (ref 135–145)
Total Bilirubin: 0.7 mg/dL (ref 0.3–1.2)
Total Protein: 6.2 g/dL — ABNORMAL LOW (ref 6.5–8.1)

## 2023-05-16 LAB — RAD ONC ARIA SESSION SUMMARY
Course Elapsed Days: 6
Plan Fractions Treated to Date: 3
Plan Prescribed Dose Per Fraction: 9 Gy
Plan Total Fractions Prescribed: 3
Plan Total Prescribed Dose: 27 Gy
Reference Point Dosage Given to Date: 27 Gy
Reference Point Session Dosage Given: 9 Gy
Session Number: 3

## 2023-05-16 LAB — TSH: TSH: 2.043 u[IU]/mL (ref 0.350–4.500)

## 2023-05-16 MED ORDER — SODIUM CHLORIDE 0.9% FLUSH
10.0000 mL | INTRAVENOUS | Status: DC | PRN
  Administered 2023-05-16: 10 mL

## 2023-05-16 MED ORDER — SODIUM CHLORIDE 0.9 % IV SOLN
524.0000 mg | Freq: Once | INTRAVENOUS | Status: AC
  Administered 2023-05-16: 520 mg via INTRAVENOUS
  Filled 2023-05-16: qty 52

## 2023-05-16 MED ORDER — SODIUM CHLORIDE 0.9 % IV SOLN: Freq: Once | INTRAVENOUS | Status: AC

## 2023-05-16 MED ORDER — SODIUM CHLORIDE 0.9 % IV SOLN
150.0000 mg | Freq: Once | INTRAVENOUS | Status: AC
  Administered 2023-05-16: 150 mg via INTRAVENOUS
  Filled 2023-05-16: qty 150

## 2023-05-16 MED ORDER — SODIUM CHLORIDE 0.9 % IV SOLN
200.0000 mg | Freq: Once | INTRAVENOUS | Status: AC
  Administered 2023-05-16: 200 mg via INTRAVENOUS
  Filled 2023-05-16: qty 200

## 2023-05-16 MED ORDER — SODIUM CHLORIDE 0.9 % IV SOLN
500.0000 mg/m2 | Freq: Once | INTRAVENOUS | Status: AC
  Administered 2023-05-16: 900 mg via INTRAVENOUS
  Filled 2023-05-16: qty 20

## 2023-05-16 MED ORDER — SODIUM CHLORIDE 0.9 % IV SOLN
10.0000 mg | Freq: Once | INTRAVENOUS | Status: AC
  Administered 2023-05-16: 10 mg via INTRAVENOUS
  Filled 2023-05-16: qty 10

## 2023-05-16 MED ORDER — PALONOSETRON HCL INJECTION 0.25 MG/5ML
0.2500 mg | Freq: Once | INTRAVENOUS | Status: AC
  Administered 2023-05-16: 0.25 mg via INTRAVENOUS

## 2023-05-16 MED ORDER — HEPARIN SOD (PORK) LOCK FLUSH 100 UNIT/ML IV SOLN
500.0000 [IU] | Freq: Once | INTRAVENOUS | Status: AC | PRN
  Administered 2023-05-16: 500 [IU]

## 2023-05-16 NOTE — Patient Instructions (Signed)
[facility]  Discharge Instructions: Thank you for choosing [facility] to provide your oncology and hematology care.   If you have a lab appointment with the Cancer Center, please go directly to the Cancer Center and check in at the registration area.   Wear comfortable clothing and clothing appropriate for easy access to any Portacath or PICC line.   We strive to give you quality time with your provider. You may need to reschedule your appointment if you arrive late (15 or more minutes).  Arriving late affects you and other patients whose appointments are after yours.  Also, if you miss three or more appointments without notifying the office, you may be dismissed from the clinic at the provider's discretion.      For prescription refill requests, have your pharmacy contact our office and allow 72 hours for refills to be completed.    Today you received the following chemotherapy and/or immunotherapy agents: Keytruda, Alimta, and Carboplatin      To help prevent nausea and vomiting after your treatment, we encourage you to take your nausea medication as directed.  BELOW ARE SYMPTOMS THAT SHOULD BE REPORTED IMMEDIATELY: *FEVER GREATER THAN 100.4 F (38 C) OR HIGHER *CHILLS OR SWEATING *NAUSEA AND VOMITING THAT IS NOT CONTROLLED WITH YOUR NAUSEA MEDICATION *UNUSUAL SHORTNESS OF BREATH *UNUSUAL BRUISING OR BLEEDING *URINARY PROBLEMS (pain or burning when urinating, or frequent urination) *BOWEL PROBLEMS (unusual diarrhea, constipation, pain near the anus) TENDERNESS IN MOUTH AND THROAT WITH OR WITHOUT PRESENCE OF ULCERS (sore throat, sores in mouth, or a toothache) UNUSUAL RASH, SWELLING OR PAIN  UNUSUAL VAGINAL DISCHARGE OR ITCHING   Items with * indicate a potential emergency and should be followed up as soon as possible or go to the Emergency Department if any problems should occur.  Please show the CHEMOTHERAPY ALERT CARD or  IMMUNOTHERAPY ALERT CARD at check-in to the Emergency Department and triage nurse.  Should you have questions after your visit or need to cancel or reschedule your appointment, please contact [facility] [place] [place] [facility]  Dept: [phone]  and follow the prompts.  Office hours are 8:00 a.m. to 4:30 p.m. Monday - Friday. Please note that voicemails left after 4:00 p.m. may not be returned until the following business day.  We are closed weekends and major holidays. You have access to a nurse at all times for urgent questions. Please call the main number to the clinic Dept: [phone] and follow the prompts.   For any non-urgent questions, you may also contact your provider using MyChart. We now offer e-Visits for anyone 49 and older to request care online for non-urgent symptoms. For details visit [email or web address].   Also download the MyChart app! Go to the app store, search "MyChart", open the app, select , and log in with your MyChart username and password.  Pembrolizumab Injection What is this medication? PEMBROLIZUMAB (PEM broe LIZ ue mab) treats some types of cancer. It works by helping your immune system slow or stop the spread of cancer cells. It is a monoclonal antibody. This medicine may be used for other purposes; ask your health care provider or pharmacist if you have questions. COMMON BRAND NAME(S): Keytruda What should I tell my care team before I take this medication? They need to know if you have any of these conditions: Allogeneic stem cell transplant (uses someone else's stem cells) Autoimmune diseases, such as Crohn disease, ulcerative colitis, lupus History of  chest radiation Nervous system problems, such as Guillain-Barre syndrome, myasthenia gravis Organ transplant An unusual or allergic reaction to pembrolizumab, other medications, foods, dyes, or preservatives Pregnant or trying to get pregnant Breast-feeding How should I use  this medication? This medication is injected into a vein. It is given by your care team in a hospital or clinic setting. A special MedGuide will be given to you before each treatment. Be sure to read this information carefully each time. Talk to your care team about the use of this medication in children. While it may be prescribed for children as young as 6 months for selected conditions, precautions do apply. Overdosage: If you think you have taken too much of this medicine contact a poison control center or emergency room at once. NOTE: This medicine is only for you. Do not share this medicine with others. What if I miss a dose? Keep appointments for follow-up doses. It is important not to miss your dose. Call your care team if you are unable to keep an appointment. What may interact with this medication? Interactions have not been studied. This list may not describe all possible interactions. Give your health care provider a list of all the medicines, herbs, non-prescription drugs, or dietary supplements you use. Also tell them if you smoke, drink alcohol, or use illegal drugs. Some items may interact with your medicine. What should I watch for while using this medication? Your condition will be monitored carefully while you are receiving this medication. You may need blood work while taking this medication. This medication may cause serious skin reactions. They can happen weeks to months after starting the medication. Contact your care team right away if you notice fevers or flu-like symptoms with a rash. The rash may be red or purple and then turn into blisters or peeling of the skin. You may also notice a red rash with swelling of the face, lips, or lymph nodes in your neck or under your arms. Tell your care team right away if you have any change in your eyesight. Talk to your care team if you may be pregnant. Serious birth defects can occur if you take this medication during pregnancy and for  4 months after the last dose. You will need a negative pregnancy test before starting this medication. Contraception is recommended while taking this medication and for 4 months after the last dose. Your care team can help you find the option that works for you. Do not breastfeed while taking this medication and for 4 months after the last dose. What side effects may I notice from receiving this medication? Side effects that you should report to your care team as soon as possible: Allergic reactions--skin rash, itching, hives, swelling of the face, lips, tongue, or throat Dry cough, shortness of breath or trouble breathing Eye pain, redness, irritation, or discharge with blurry or decreased vision Heart muscle inflammation--unusual weakness or fatigue, shortness of breath, chest pain, fast or irregular heartbeat, dizziness, swelling of the ankles, feet, or hands Hormone gland problems--headache, sensitivity to light, unusual weakness or fatigue, dizziness, fast or irregular heartbeat, increased sensitivity to cold or heat, excessive sweating, constipation, hair loss, increased thirst or amount of urine, tremors or shaking, irritability Infusion reactions--chest pain, shortness of breath or trouble breathing, feeling faint or lightheaded Kidney injury (glomerulonephritis)--decrease in the amount of urine, red or dark brown urine, foamy or bubbly urine, swelling of the ankles, hands, or feet Liver injury--right upper belly pain, loss of appetite, nausea, light-colored stool,  dark yellow or brown urine, yellowing skin or eyes, unusual weakness or fatigue Pain, tingling, or numbness in the hands or feet, muscle weakness, change in vision, confusion or trouble speaking, loss of balance or coordination, trouble walking, seizures Rash, fever, and swollen lymph nodes Redness, blistering, peeling, or loosening of the skin, including inside the mouth Sudden or severe stomach pain, bloody diarrhea, fever,  nausea, vomiting Side effects that usually do not require medical attention (report to your care team if they continue or are bothersome): Bone, joint, or muscle pain Diarrhea Fatigue Loss of appetite Nausea Skin rash This list may not describe all possible side effects. Call your doctor for medical advice about side effects. You may report side effects to FDA at 1-800-FDA-1088. Where should I keep my medication? This medication is given in a hospital or clinic. It will not be stored at home. NOTE: This sheet is a summary. It may not cover all possible information. If you have questions about this medicine, talk to your doctor, pharmacist, or health care provider.  2024 Elsevier/Gold Standard (2022-02-09 00:00:00)  Pemetrexed Injection What is this medication? PEMETREXED (PEM e TREX ed) treats some types of cancer. It works by slowing down the growth of cancer cells. This medicine may be used for other purposes; ask your health care provider or pharmacist if you have questions. COMMON BRAND NAME(S): Alimta, PEMFEXY, PEMRYDI RTU What should I tell my care team before I take this medication? They need to know if you have any of these conditions: Infection, such as chickenpox, cold sores, or herpes Kidney disease Low blood cell levels (white cells, red cells, and platelets) Lung or breathing disease, such as asthma Radiation therapy An unusual or allergic reaction to pemetrexed, other medications, foods, dyes, or preservatives If you or your partner are pregnant or trying to get pregnant Breast-feeding How should I use this medication? This medication is injected into a vein. It is given by your care team in a hospital or clinic setting. Talk to your care team about the use of this medication in children. Special care may be needed. Overdosage: If you think you have taken too much of this medicine contact a poison control center or emergency room at once. NOTE: This medicine is only  for you. Do not share this medicine with others. What if I miss a dose? Keep appointments for follow-up doses. It is important not to miss your dose. Call your care team if you are unable to keep an appointment. What may interact with this medication? Do not take this medication with any of the following: Live virus vaccines This medication may also interact with the following: Ibuprofen This list may not describe all possible interactions. Give your health care provider a list of all the medicines, herbs, non-prescription drugs, or dietary supplements you use. Also tell them if you smoke, drink alcohol, or use illegal drugs. Some items may interact with your medicine. What should I watch for while using this medication? Your condition will be monitored carefully while you are receiving this medication. This medication may make you feel generally unwell. This is not uncommon as chemotherapy can affect healthy cells as well as cancer cells. Report any side effects. Continue your course of treatment even though you feel ill unless your care team tells you to stop. This medication can cause serious side effects. To reduce the risk, your care team may give you other medications to take before receiving this one. Be sure to follow the directions from  your care team. This medication can cause a rash or redness in areas of the body that have previously had radiation therapy. If you have had radiation therapy, tell your care team if you notice a rash in this area. This medication may increase your risk of getting an infection. Call your care team for advice if you get a fever, chills, sore throat, or other symptoms of a cold or flu. Do not treat yourself. Try to avoid being around people who are sick. Be careful brushing or flossing your teeth or using a toothpick because you may get an infection or bleed more easily. If you have any dental work done, tell your dentist you are receiving this  medication. Avoid taking medications that contain aspirin, acetaminophen, ibuprofen, naproxen, or ketoprofen unless instructed by your care team. These medications may hide a fever. Check with your care team if you have severe diarrhea, nausea, and vomiting, or if you sweat a lot. The loss of too much body fluid may make it dangerous for you to take this medication. Talk to your care team if you or your partner wish to become pregnant or think either of you might be pregnant. This medication can cause serious birth defects if taken during pregnancy and for 6 months after the last dose. A negative pregnancy test is required before starting this medication. A reliable form of contraception is recommended while taking this medication and for 6 months after the last dose. Talk to your care team about reliable forms of contraception. Do not father a child while taking this medication and for 3 months after the last dose. Use a condom while having sex during this time period. Do not breastfeed while taking this medication and for 1 week after the last dose. This medication may cause infertility. Talk to your care team if you are concerned about your fertility. What side effects may I notice from receiving this medication? Side effects that you should report to your care team as soon as possible: Allergic reactions--skin rash, itching, hives, swelling of the face, lips, tongue, or throat Dry cough, shortness of breath or trouble breathing Infection--fever, chills, cough, sore throat, wounds that don't heal, pain or trouble when passing urine, general feeling of discomfort or being unwell Kidney injury--decrease in the amount of urine, swelling of the ankles, hands, or feet Low red blood cell level--unusual weakness or fatigue, dizziness, headache, trouble breathing Redness, blistering, peeling, or loosening of the skin, including inside the mouth Unusual bruising or bleeding Side effects that usually do not  require medical attention (report to your care team if they continue or are bothersome): Fatigue Loss of appetite Nausea Vomiting This list may not describe all possible side effects. Call your doctor for medical advice about side effects. You may report side effects to FDA at 1-800-FDA-1088. Where should I keep my medication? This medication is given in a hospital or clinic. It will not be stored at home. NOTE: This sheet is a summary. It may not cover all possible information. If you have questions about this medicine, talk to your doctor, pharmacist, or health care provider.  2024 Elsevier/Gold Standard (2022-02-02 00:00:00)  Carboplatin Injection What is this medication? CARBOPLATIN (KAR boe pla tin) treats some types of cancer. It works by slowing down the growth of cancer cells. This medicine may be used for other purposes; ask your health care provider or pharmacist if you have questions. COMMON BRAND NAME(S): Paraplatin What should I tell my care team before I take  this medication? They need to know if you have any of these conditions: Blood disorders Hearing problems Kidney disease Recent or ongoing radiation therapy An unusual or allergic reaction to carboplatin, cisplatin, other medications, foods, dyes, or preservatives Pregnant or trying to get pregnant Breast-feeding How should I use this medication? This medication is injected into a vein. It is given by your care team in a hospital or clinic setting. Talk to your care team about the use of this medication in children. Special care may be needed. Overdosage: If you think you have taken too much of this medicine contact a poison control center or emergency room at once. NOTE: This medicine is only for you. Do not share this medicine with others. What if I miss a dose? Keep appointments for follow-up doses. It is important not to miss your dose. Call your care team if you are unable to keep an appointment. What may  interact with this medication? Medications for seizures Some antibiotics, such as amikacin, gentamicin, neomycin, streptomycin, tobramycin Vaccines This list may not describe all possible interactions. Give your health care provider a list of all the medicines, herbs, non-prescription drugs, or dietary supplements you use. Also tell them if you smoke, drink alcohol, or use illegal drugs. Some items may interact with your medicine. What should I watch for while using this medication? Your condition will be monitored carefully while you are receiving this medication. You may need blood work while taking this medication. This medication may make you feel generally unwell. This is not uncommon, as chemotherapy can affect healthy cells as well as cancer cells. Report any side effects. Continue your course of treatment even though you feel ill unless your care team tells you to stop. In some cases, you may be given additional medications to help with side effects. Follow all directions for their use. This medication may increase your risk of getting an infection. Call your care team for advice if you get a fever, chills, sore throat, or other symptoms of a cold or flu. Do not treat yourself. Try to avoid being around people who are sick. Avoid taking medications that contain aspirin, acetaminophen, ibuprofen, naproxen, or ketoprofen unless instructed by your care team. These medications may hide a fever. Be careful brushing or flossing your teeth or using a toothpick because you may get an infection or bleed more easily. If you have any dental work done, tell your dentist you are receiving this medication. Talk to your care team if you wish to become pregnant or think you might be pregnant. This medication can cause serious birth defects. Talk to your care team about effective forms of contraception. Do not breast-feed while taking this medication. What side effects may I notice from receiving this  medication? Side effects that you should report to your care team as soon as possible: Allergic reactions--skin rash, itching, hives, swelling of the face, lips, tongue, or throat Infection--fever, chills, cough, sore throat, wounds that don't heal, pain or trouble when passing urine, general feeling of discomfort or being unwell Low red blood cell level--unusual weakness or fatigue, dizziness, headache, trouble breathing Pain, tingling, or numbness in the hands or feet, muscle weakness, change in vision, confusion or trouble speaking, loss of balance or coordination, trouble walking, seizures Unusual bruising or bleeding Side effects that usually do not require medical attention (report to your care team if they continue or are bothersome): Hair loss Nausea Unusual weakness or fatigue Vomiting This list may not describe all possible side  effects. Call your doctor for medical advice about side effects. You may report side effects to FDA at 1-800-FDA-1088. Where should I keep my medication? This medication is given in a hospital or clinic. It will not be stored at home. NOTE: This sheet is a summary. It may not cover all possible information. If you have questions about this medicine, talk to your doctor, pharmacist, or health care provider.  2024 Elsevier/Gold Standard (2022-01-19 00:00:00)

## 2023-05-16 NOTE — Telephone Encounter (Signed)
Met with Matthew Rivera during infusion visit informing him that his documentation for Disability had been completed and faxed to company. Fax conformation received and request for medical records sent to [employer]. Handed off copy of documents to patient.

## 2023-05-16 NOTE — Progress Notes (Signed)
Nurse monitoring complete status post 3 of 3 SRS treatments. Patient rested with [place] for 15 minutes and is without complaints. Patient denies new or worsening neurologic symptoms. Vitals stable. Instructed patient to avoid strenuous activity for the next 24 hours.  Instructed patient to call [phone] with needs related to treatment after hours or over the weekend. Patient verbalized understanding. Ambulated out of clinic unassisted to registration to check in for MedOnc appointment.   Vitals:   05/16/23 0920  BP: 131/73  Resp: 20  Temp: 97.8 F (36.6 C)  SpO2: 100%

## 2023-05-17 ENCOUNTER — Encounter: Payer: Self-pay | Admitting: Internal Medicine

## 2023-05-17 NOTE — Radiation Completion Notes (Signed)
Patient Name: Matthew Rivera, Matthew Rivera MRN: [id number] Date of Birth: April 03, 1961 Referring Physician: Si Gaul, M.D. Date of Service: 2023-05-17 Radiation Oncologist: Lonie Peak, M.D. 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

## 2023-05-17 NOTE — Telephone Encounter (Signed)
Called pt & left message on identified vm to call back to let us know how he is doing post treatment.

## 2023-05-17 NOTE — Telephone Encounter (Signed)
-----   Message from Nurse Ashby Dawes sent at 05/16/2023 12:52 PM EDT ----- Regarding: First Time/ Keytruda, Alimta, Carboplatin/ Dr Arbutus Ped pt Pt had first time Keytruda, Alimta, and Carboplatin today. Tolerated well.   Thanks, Libbie K.

## 2023-05-19 ENCOUNTER — Telehealth: Payer: Self-pay | Admitting: Internal Medicine

## 2023-05-19 NOTE — Telephone Encounter (Signed)
Called patient regarding upcoming 08/13 appointment, patient is notified.

## 2023-05-20 ENCOUNTER — Other Ambulatory Visit: Payer: Self-pay

## 2023-05-20 NOTE — Op Note (Signed)
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 Basilio Cairo  TECHNIQUE:  The patient underwent a radiation treatment planning session in the radiation oncology simulation suite under the care of the radiation oncology physician and physicist.  I participated closely in the radiation treatment planning afterwards. The patient underwent planning CT which was fused to 3T high resolution MRI with 1 mm axial slices.  These images were fused on the planning system.  We contoured the gross target volumes and subsequently expanded this to yield the Planning Target Volume. I actively participated in the planning process.  I helped to define and review the target contours and also the contours of the optic pathway, eyes, brainstem and selected nearby organs at risk.  All the dose constraints for critical structures were reviewed and compared to AAPM Task Group 101.  The prescription dose conformity was reviewed.  I approved the plan electronically.    Accordingly, Matthew Rivera  was brought to the TrueBeam stereotactic radiation treatment linac and placed in the custom immobilization mask.  The patient was aligned according to the IR fiducial markers with BrainLab Exactrac, then orthogonal x-rays were used in ExacTrac with the 6DOF robotic table and the shifts were made to align the patient.  Matthew Rivera received stereotactic radiosurgery to a prescription dose of 9Gy uneventfully (planned for 3 fractions for a total dose of 27Gy).    The detailed description of the procedure is recorded in the radiation oncology procedure note.  I was present for the duration of the procedure.  DISPOSITION:   Following  delivery, the patient was transported to nursing in stable condition and monitored for possible acute effects to be discharged to home in stable condition with follow-up in one month.  Monia Pouch, DO [facility]

## 2023-05-21 ENCOUNTER — Other Ambulatory Visit: Payer: Self-pay

## 2023-05-21 NOTE — Progress Notes (Unsigned)
[facility] OFFICE PROGRESS NOTE  Matthew Nevins, MD [address] [place] [address]  DIAGNOSIS: Stage IV (T2b, N2, M1b) non-small cell lung cancer, adenocarcinoma. He presented a large central left lung mass in addition to left hilar and mediastinal lymphadenopathy as well as additional lung lesion in the right lower lobe and solitary brain metastasis involving the inferior lateral right cerebellar hemisphere. He was diagnosed in June 2024.   PDL1 Expression 80%   Molecular studies: Positive for KRAS G12C mutation which can be used in the second line setting  PRIOR THERAPY:  1) status post craniotomy and surgical resection on April 07, 2023 under the care of [name]  2) Radiation to the brain cavity by Dr. Basilio Cairo.  Plan is for 3 treatments   CURRENT THERAPY: Systemic chemotherapy with carboplatin for an AUC of 5, Alimta 500 mg/m, and Keytruda 200 mg IV every 3 weeks.  First dose on 05/16/2023. Status post 1 cycle   INTERVAL HISTORY: Matthew Rivera 62 y.o. male returns to the clinic today for follow-up visit.  The patient was last seen in the clinic by myself on 04/30/2023.  At that point in time, we had discussion about his systemic treatment options.  He started on systemic chemotherapy and immunotherapy last week and he tolerated it ***.  Also in the interval since last being seen, the patient completed SRS to the brain cavity which she tolerated***.    In  Energy?  Generally weak?  Decadron taper?  He denies any fever, chills, night sweats, or unexplained weight loss.  Taste alterations?  He reports he sometimes has dyspnea on exertion but nothing significant.  He saw Dr. Dora Sims from pulmonary medicine recently on 05/10/2023.  He denies any chest pain or hemoptysis.  Cough?  Denies any significant headaches or vision changes.  Denies any nausea, vomiting, diarrhea, or constipation.  Stool softeners?  He is here today for evaluation and repeat blood work in 1 week  follow-up visit to manage any adverse side effects of treatment     Disability completed.   Finished SRS.   MEDICAL HISTORY: Past Medical History:  Diagnosis Date   Dyslipidemia    GERD (gastroesophageal reflux disease)    Lung nodule seen on imaging study 03/31/2023   seen on CT results   Osteoarthritis    Smoker    long term smoker 1 ppd    ALLERGIES:  has No Known Allergies.  MEDICATIONS:  Current Outpatient Medications  Medication Sig Dispense Refill   dexamethasone (DECADRON) 2 MG tablet Take 1 tablet twice a day for 1 week, followed by 1 tablet daily for 1 week, then stop 21 tablet 0   fluconazole (DIFLUCAN) 100 MG tablet Take 2 tablets today, then 1 tablet daily x 20 more days. Do not take any statin medications for cholesterol while on this. 22 tablet 0   folic acid (FOLVITE) 1 MG tablet Take 1 tablet (1 mg total) by mouth daily. 30 tablet 2   HYDROcodone-acetaminophen (NORCO) 10-325 MG tablet Take 1 tablet by mouth every 4 (four) hours as needed for moderate pain.     lidocaine-prilocaine (EMLA) cream Apply 1 Application topically as needed. 30 g 2   LORazepam (ATIVAN) 0.5 MG tablet Take 1 tablet (0.5 mg total) by mouth as needed for anxiety. Take 1-2 tablet by mouth 20 minutes before MRI or radiation treatment. 12 tablet 0   Naphazoline-Pheniramine (VISINE OP) Place 1 drop into both eyes daily as needed (dry eyes).  nicotine (NICODERM CQ - DOSED IN MG/24 HOURS) 14 mg/24hr patch Place 1 patch (14 mg total) onto the skin daily. 28 patch 0   OVER THE COUNTER MEDICATION Take 1 tablet by mouth daily as needed (constipation). Stool softner     pantoprazole (PROTONIX) 40 MG tablet Take 1 tablet (40 mg total) by mouth 2 (two) times daily. 60 tablet 2   prochlorperazine (COMPAZINE) 10 MG tablet Take 1 tablet (10 mg total) by mouth every 6 (six) hours as needed. 30 tablet 2   tamsulosin (FLOMAX) 0.4 MG CAPS capsule Take 0.4 mg by mouth at bedtime as needed (urinary flow  issues).     Tiotropium Bromide-Olodaterol (STIOLTO RESPIMAT) 2.5-2.5 MCG/ACT AERS Inhale 2 puffs into the lungs daily. 4 g 11   traMADol (ULTRAM) 50 MG tablet Take 50-100 mg by mouth 4 (four) times daily as needed for moderate pain.     zolpidem (AMBIEN) 10 MG tablet Take 10 mg by mouth at bedtime as needed for sleep.     No current facility-administered medications for this visit.    SURGICAL HISTORY:  Past Surgical History:  Procedure Laterality Date   BACK SURGERY     x 1   COLONOSCOPY WITH PROPOFOL N/A 08/18/2020   Procedure: COLONOSCOPY WITH PROPOFOL;  Surgeon: Lanelle Bal, DO;  Location: [facility];  Service: Endoscopy;  Laterality: N/A;  7:30am   CRANIOTOMY Right 04/07/2023   Procedure: Right suboccipital craniotomy for tumor resection;  Surgeon: Tressie Stalker, MD;  Location: [facility] OR;  Service: Neurosurgery;  Laterality: Right;  TO follow   IR IMAGING GUIDED PORT INSERTION  05/12/2023   MULTIPLE TOOTH EXTRACTIONS     wears full dentures   POLYPECTOMY  08/18/2020   Procedure: POLYPECTOMY INTESTINAL;  Surgeon: Lanelle Bal, DO;  Location: [facility];  Service: Endoscopy;;    REVIEW OF SYSTEMS:   Review of Systems  Constitutional: Negative for appetite change, chills, fatigue, fever and unexpected weight change.  HENT:   Negative for mouth sores, nosebleeds, sore throat and trouble swallowing.   Eyes: Negative for eye problems and icterus.  Respiratory: Negative for cough, hemoptysis, shortness of breath and wheezing.   Cardiovascular: Negative for chest pain and leg swelling.  Gastrointestinal: Negative for abdominal pain, constipation, diarrhea, nausea and vomiting.  Genitourinary: Negative for bladder incontinence, difficulty urinating, dysuria, frequency and hematuria.   Musculoskeletal: Negative for back pain, gait problem, neck pain and neck stiffness.  Skin: Negative for itching and rash.  Neurological: Negative for dizziness, extremity weakness, gait  problem, headaches, light-headedness and seizures.  Hematological: Negative for adenopathy. Does not bruise/bleed easily.  Psychiatric/Behavioral: Negative for confusion, depression and sleep disturbance. The patient is not nervous/anxious.     PHYSICAL EXAMINATION:  There were no vitals taken for this visit.  ECOG PERFORMANCE STATUS: {CHL ONC ECOG [id number]  Physical Exam  Constitutional: Oriented to person, place, and time and well-developed, well-nourished, and in no distress. No distress.  HENT:  Head: Normocephalic and atraumatic.  Mouth/Throat: Oropharynx is clear and moist. No oropharyngeal exudate.  Eyes: Conjunctivae are normal. Right eye exhibits no discharge. Left eye exhibits no discharge. No scleral icterus.  Neck: Normal range of motion. Neck supple.  Cardiovascular: Normal rate, regular rhythm, normal heart sounds and intact distal pulses.   Pulmonary/Chest: Effort normal and breath sounds normal. No respiratory distress. No wheezes. No rales.  Abdominal: Soft. Bowel sounds are normal. Exhibits no distension and no mass. There is no tenderness.  Musculoskeletal: Normal range  of motion. Exhibits no edema.  Lymphadenopathy:    No cervical adenopathy.  Neurological: Alert and oriented to person, place, and time. Exhibits normal muscle tone. Gait normal. Coordination normal.  Skin: Skin is warm and dry. No rash noted. Not diaphoretic. No erythema. No pallor.  Psychiatric: Mood, memory and judgment normal.  Vitals reviewed.  LABORATORY DATA: Lab Results  Component Value Date   WBC 11.5 (H) 05/16/2023   HGB 13.2 05/16/2023   HCT 39.3 05/16/2023   MCV 92.0 05/16/2023   PLT 112 (L) 05/16/2023      Chemistry      Component Value Date/Time   NA 136 05/16/2023 0820   K 4.2 05/16/2023 0820   CL 102 05/16/2023 0820   CO2 29 05/16/2023 0820   BUN 17 05/16/2023 0820   CREATININE 0.80 05/16/2023 0820      Component Value Date/Time   CALCIUM 8.7 (L) 05/16/2023 0820    ALKPHOS 56 05/16/2023 0820   AST 13 (L) 05/16/2023 0820   ALT 29 05/16/2023 0820   BILITOT 0.7 05/16/2023 0820       RADIOGRAPHIC STUDIES:  IR IMAGING GUIDED PORT INSERTION  Result Date: 05/12/2023 INDICATION: 62 year old male referred for port catheter EXAM: IMAGE GUIDED PORT CATHETER MEDICATIONS: None ANESTHESIA/SEDATION: Moderate (conscious) sedation was employed during this procedure. A total of Versed 1.0 mg and Fentanyl 50 mcg was administered intravenously. Moderate Sedation Time: 17 minutes. The patient's level of consciousness and vital signs were monitored continuously by radiology nursing throughout the procedure under my direct supervision. FLUOROSCOPY TIME:  Fluoroscopy Time:  (2 mGy). COMPLICATIONS: None PROCEDURE: Informed written consent was obtained from the patient after a thorough discussion of the procedural risks, benefits and alternatives. All questions were addressed. Maximal Sterile Barrier Technique was utilized including caps, mask, sterile gowns, sterile gloves, sterile drape, hand hygiene and skin antiseptic. A timeout was performed prior to the initiation of the procedure. Ultrasound survey was performed with images stored and sent to PACs. Right IJ vein documented to be patent. The right neck and chest was prepped with chlorhexidine, and draped in the usual sterile fashion using maximum barrier technique (cap and mask, sterile gown, sterile gloves, large sterile sheet, hand hygiene and cutaneous antiseptic). Local anesthesia was attained by infiltration with 1% lidocaine without epinephrine. Ultrasound demonstrated patency of the right internal jugular vein, and this was documented with an image. Under real-time ultrasound guidance, this vein was accessed with a 21 gauge micropuncture needle and image documentation was performed. A small dermatotomy was made at the access site with an 11 scalpel. A 0.018" wire was advanced into the SVC and used to estimate the length of  the internal catheter. The access needle exchanged for a 72F micropuncture vascular sheath. The 0.018" wire was then removed and a 0.035" wire advanced into the IVC. An appropriate location for the subcutaneous reservoir was selected below the clavicle and an incision was made through the skin and underlying soft tissues. The subcutaneous tissues were then dissected using a combination of blunt and sharp surgical technique and a pocket was formed. A single lumen power injectable portacatheter was then tunneled through the subcutaneous tissues from the pocket to the dermatotomy and the port reservoir placed within the subcutaneous pocket. The venous access site was then serially dilated and a peel away vascular sheath placed over the wire. The wire was removed and the port catheter advanced into position under fluoroscopic guidance. The catheter tip is positioned in the cavoatrial junction. This was documented  with a spot image. The portacatheter was then tested and found to flush and aspirate well. The port was flushed with saline followed by 100 units/mL heparinized saline. The pocket was then closed in two layers using first subdermal inverted interrupted absorbable sutures followed by a running subcuticular suture. The epidermis was then sealed with Dermabond. The dermatotomy at the venous access site was also seal with Dermabond. Patient tolerated the procedure well and remained hemodynamically stable throughout. No complications encountered and no significant blood loss encountered IMPRESSION: Status post right IJ port catheter Signed, Yvone Neu. Miachel Roux, RPVI [facility] Electronically Signed   By: Gilmer Mor D.O.   On: 05/12/2023 12:34   NM PET Image Initial (PI) Skull Base To Thigh (F-18 FDG)  Result Date: 04/29/2023 CLINICAL DATA:  Initial treatment strategy for non-small-cell lung cancer. Staging. Suboccipital craniotomy for tumor  resection. EXAM: NUCLEAR MEDICINE PET SKULL BASE TO THIGH TECHNIQUE: 8.4 mCi F-18 FDG was injected intravenously. Full-ring PET imaging was performed from the skull base to thigh after the radiotracer. CT data was obtained and used for attenuation correction and anatomic localization. Fasting blood glucose: 125 mg/dl COMPARISON:  82/95/6213 chest abdomen and pelvic CTs. FINDINGS: Mediastinal blood pool activity: SUV max 1.7 Liver activity: SUV max NA NECK: No areas of abnormal hypermetabolism. Right posterior fossa photopenia consistent with suboccipital craniotomy resection. Incidental CT findings: No cervical adenopathy. CHEST: Left infrahilar, hilar, and subcarinal nodal metastasis. Index subcarinal node measures 2.0 cm and a S.U.V. max of 6.0 on 75/3. Suprahilar and infrahilar nodes are diminutive. Left lower lobe lung mass measures 4.2 x 2.3 cm and a S.U.V. max of 5.1 on 88/4. Dependent right lower lobe pulmonary nodule measures 2.4 cm and a S.U.V. max of 6.1 on 89/4. Left lower lobe pulmonary nodule measures 6 mm and a S.U.V. max of 2.9 on 84/4. Incidental CT findings: Deferred to recent diagnostic CT. A right lower lobe 5 mm pulmonary nodule is below PET resolution. Centrilobular emphysema. Aortic and coronary artery calcification. ABDOMEN/PELVIS: Posterior segment 4A mild hypermetabolism is without correlate on today's CT or the recent contrast-enhanced CT. Measures a S.U.V. max of 3.4. No abdominopelvic nodal hypermetabolism. Incidental CT findings: Deferred to recent diagnostic CT. Abdominal aortic atherosclerosis. Normal adrenal glands. Large left hydrocele. SKELETON: No abnormal marrow activity. Incidental CT findings: None. IMPRESSION: 1. Dominant left lower lobe lung mass with thoracic nodal metastasis. 2. Smaller bilateral hypermetabolic pulmonary nodules are likely metastasis. The 2.4 cm right lower lobe nodule could represent a synchronous primary. 3. No hypermetabolic extrathoracic metastasis  identified. 4. Focus of mild hypermetabolism within the posterior aspect of segment 4A of the liver is without correlate and favored to be related to hepatic altered perfusion. Recommend attention on follow-up. 5. Incidental findings, including: Coronary artery atherosclerosis. Aortic Atherosclerosis (ICD10-I70.0). Emphysema (ICD10-J43.9). Electronically Signed   By: Jeronimo Greaves M.D.   On: 04/29/2023 14:21   MR Brain W Wo Contrast  Result Date: 04/28/2023 CLINICAL DATA:  Brain metastases. EXAM: MRI HEAD WITHOUT AND WITH CONTRAST TECHNIQUE: Multiplanar, multiecho pulse sequences of the brain and surrounding structures were obtained without and with intravenous contrast. CONTRAST:  6mL GADAVIST GADOBUTROL 1 MMOL/ML IV SOLN COMPARISON:  Brain MRI 03/31/2023 FINDINGS: Brain: There has been interval right suboccipital craniotomy for mass resection. There is mild curvature linear enhancement at the periphery of the resection site but no residual masslike or nodular enhancement. There is a small amount of gliosis and possible ischemia (310-13) in  the adjacent cerebellar parenchyma with associated postsurgical blood products, the extent of edema has markedly decreased compared to the preoperative study. There is no new mass lesion or other abnormal enhancement. There is no evidence of acute intracranial hemorrhage, extra-axial fluid collection, or acute territorial infarct. Background parenchymal volume is normal. The ventricles are normal in size. Scattered foci of FLAIR signal abnormality in the supratentorial brain are again seen, consistent with chronic small-vessel ischemic change. The pituitary and suprasellar region are normal. There is no midline shift. Vascular: Normal flow voids. Skull and upper cervical spine: Postsurgical changes as above. There is no suspicious marrow signal abnormality. Sinuses/Orbits: The paranasal sinuses are clear. The globes and orbits are unremarkable. Other: There are small  bilateral mastoid effusions. IMPRESSION: 1. Interval right suboccipital craniotomy for cerebellar mass resection with essentially resolved edema in the cerebellar hemisphere. 2. Thin curvilinear enhancement at the periphery of the resection cavity is favored postsurgical. Recommend attention on follow-up. 3. No new mass lesion. Electronically Signed   By: Lesia Hausen M.D.   On: 04/28/2023 15:19     ASSESSMENT/PLAN:  This is a very pleasant 62 year old male with stage IV (T2b, N2, M1b) non-small cell lung cancer, adenocarcinoma. He presented a large central left lung mass in addition to left hilar and mediastinal lymphadenopathy as well as additional lung lesion in the right lower lobe and solitary brain metastasis involving the inferior lateral right cerebellar hemisphere. He was diagnosed in June 2024.    His PD-L1 expression is 80%.  His molecular studies by foundation 1 shows K-ras G12 C mutation which can be used in the second line setting  The patient underwent craniotomy and surgical resection on 04/07/2023 by [name].   He subsequently had SRS to the brain cavity by Dr. Basilio Cairo  He is currently undergoing palliative systemic chemotherapy with carboplatin for an AUC of 5 on day 1, Alimta 500 mg/m2 and keytruda 200 mg/m2 IV every 3 weeks.  He is status post 1 cycle and tolerated ***  The patient was seen with Dr. Arbutus Ped today.  Labs were reviewed.  Recommend ***  We will see him back for follow-up visit in 2 weeks for evaluation repeat blood work before undergoing cycle #2.  Social work?  Weekly labs  Has port now.   The patient was advised to call immediately if she has any concerning symptoms in the interval. The patient voices understanding of current disease status and treatment options and is in agreement with the current care plan. All questions were answered. The patient knows to call the clinic with any problems, questions or concerns. We can certainly see the patient much  sooner if necessary       No orders of the defined types were placed in this encounter.    I spent {CHL ONC TIME VISIT - HQION:[id number]} counseling the patient face to face. The total time spent in the appointment was {CHL ONC TIME VISIT - GMWNU:[id number]}.   L , PA-C 05/21/23

## 2023-05-24 ENCOUNTER — Other Ambulatory Visit: Payer: Self-pay

## 2023-05-24 ENCOUNTER — Inpatient Hospital Stay: Payer: 59

## 2023-05-24 ENCOUNTER — Other Ambulatory Visit: Payer: Self-pay | Admitting: Medical Oncology

## 2023-05-24 ENCOUNTER — Inpatient Hospital Stay: Payer: 59 | Admitting: Physician Assistant

## 2023-05-24 ENCOUNTER — Inpatient Hospital Stay (HOSPITAL_BASED_OUTPATIENT_CLINIC_OR_DEPARTMENT_OTHER): Payer: 59 | Admitting: Physician Assistant

## 2023-05-24 VITALS — BP 111/68 | HR 98 | Resp 16

## 2023-05-24 VITALS — BP 125/73 | HR 106 | Temp 98.0°F | Resp 17 | Wt 140.3 lb

## 2023-05-24 DIAGNOSIS — C3492 Malignant neoplasm of unspecified part of left bronchus or lung: Secondary | ICD-10-CM

## 2023-05-24 DIAGNOSIS — Z95828 Presence of other vascular implants and grafts: Secondary | ICD-10-CM

## 2023-05-24 DIAGNOSIS — R490 Dysphonia: Secondary | ICD-10-CM

## 2023-05-24 DIAGNOSIS — R112 Nausea with vomiting, unspecified: Secondary | ICD-10-CM

## 2023-05-24 DIAGNOSIS — Z510 Encounter for antineoplastic radiation therapy: Secondary | ICD-10-CM | POA: Diagnosis not present

## 2023-05-24 LAB — CMP (CANCER CENTER ONLY)
ALT: 31 U/L (ref 0–44)
AST: 21 U/L (ref 15–41)
Albumin: 3.2 g/dL — ABNORMAL LOW (ref 3.5–5.0)
Alkaline Phosphatase: 132 U/L — ABNORMAL HIGH (ref 38–126)
Anion gap: 7 (ref 5–15)
BUN: 28 mg/dL — ABNORMAL HIGH (ref 8–23)
CO2: 26 mmol/L (ref 22–32)
Calcium: 8.4 mg/dL — ABNORMAL LOW (ref 8.9–10.3)
Chloride: 92 mmol/L — ABNORMAL LOW (ref 98–111)
Creatinine: 1.08 mg/dL (ref 0.61–1.24)
GFR, Estimated: >60 mL/min (ref >60)
Glucose, Bld: 101 mg/dL — ABNORMAL HIGH (ref 70–99)
Potassium: 4.3 mmol/L (ref 3.5–5.1)
Sodium: 125 mmol/L — ABNORMAL LOW (ref 135–145)
Total Bilirubin: 2.2 mg/dL — ABNORMAL HIGH (ref 0.3–1.2)
Total Protein: 6.5 g/dL (ref 6.5–8.1)

## 2023-05-24 LAB — CBC WITH DIFFERENTIAL (CANCER CENTER ONLY)
Abs Immature Granulocytes: 0 10*3/uL (ref 0.00–0.07)
Band Neutrophils: 2 %
Basophils Absolute: 0 10*3/uL (ref 0.0–0.1)
Basophils Relative: 0 %
Eosinophils Absolute: 0.1 10*3/uL (ref 0.0–0.5)
Eosinophils Relative: 7 %
HCT: 31.5 % — ABNORMAL LOW (ref 39.0–52.0)
Hemoglobin: 11.8 g/dL — ABNORMAL LOW (ref 13.0–17.0)
Lymphocytes Relative: 34 %
Lymphs Abs: 0.6 10*3/uL — ABNORMAL LOW (ref 0.7–4.0)
MCH: 30.9 pg (ref 26.0–34.0)
MCHC: 37.5 g/dL — ABNORMAL HIGH (ref 30.0–36.0)
MCV: 82.5 fL (ref 80.0–100.0)
Monocytes Absolute: 0.1 10*3/uL (ref 0.1–1.0)
Monocytes Relative: 7 %
Neutro Abs: 0.9 10*3/uL — ABNORMAL LOW (ref 1.7–7.7)
Neutrophils Relative %: 50 %
Platelet Count: 98 10*3/uL — ABNORMAL LOW (ref 150–400)
RBC: 3.82 MIL/uL — ABNORMAL LOW (ref 4.22–5.81)
RDW: 15.7 % — ABNORMAL HIGH (ref 11.5–15.5)
WBC Count: 1.7 10*3/uL — ABNORMAL LOW (ref 4.0–10.5)
nRBC: 0 % (ref 0.0–0.2)

## 2023-05-24 MED ORDER — ONDANSETRON HCL 4 MG/2ML IJ SOLN
8.0000 mg | Freq: Once | INTRAMUSCULAR | Status: AC
  Administered 2023-05-24: 8 mg via INTRAVENOUS
  Filled 2023-05-24: qty 4

## 2023-05-24 MED ORDER — SODIUM CHLORIDE 0.9% FLUSH
10.0000 mL | INTRAVENOUS | Status: DC | PRN
  Administered 2023-05-24: 10 mL

## 2023-05-24 MED ORDER — HEPARIN SOD (PORK) LOCK FLUSH 100 UNIT/ML IV SOLN
500.0000 [IU] | Freq: Once | INTRAVENOUS | Status: AC | PRN
  Administered 2023-05-24: 500 [IU]

## 2023-05-24 MED ORDER — ONDANSETRON 4 MG PO TBDP
4.0000 mg | ORAL_TABLET | Freq: Three times a day (TID) | ORAL | 2 refills | Status: AC | PRN
Start: 2023-05-24 — End: ?

## 2023-05-24 MED ORDER — SODIUM CHLORIDE 0.9 % IV SOLN: Freq: Once | INTRAVENOUS | Status: AC

## 2023-05-24 NOTE — Patient Instructions (Signed)

## 2023-05-30 ENCOUNTER — Inpatient Hospital Stay: Payer: 59

## 2023-05-30 VITALS — BP 98/65 | HR 82 | Temp 97.8°F | Resp 18

## 2023-05-30 VITALS — BP 84/61 | HR 96 | Temp 98.4°F

## 2023-05-30 DIAGNOSIS — C3492 Malignant neoplasm of unspecified part of left bronchus or lung: Secondary | ICD-10-CM

## 2023-05-30 DIAGNOSIS — C3490 Malignant neoplasm of unspecified part of unspecified bronchus or lung: Secondary | ICD-10-CM | POA: Diagnosis present

## 2023-05-30 DIAGNOSIS — E785 Hyperlipidemia, unspecified: Secondary | ICD-10-CM | POA: Diagnosis present

## 2023-05-30 DIAGNOSIS — I2693 Single subsegmental pulmonary embolism without acute cor pulmonale: Principal | ICD-10-CM | POA: Diagnosis present

## 2023-05-30 DIAGNOSIS — K219 Gastro-esophageal reflux disease without esophagitis: Secondary | ICD-10-CM | POA: Diagnosis present

## 2023-05-30 DIAGNOSIS — R000 Tachycardia, unspecified: Secondary | ICD-10-CM | POA: Diagnosis present

## 2023-05-30 DIAGNOSIS — I824Y2 Acute embolism and thrombosis of unspecified deep veins of left proximal lower extremity: Secondary | ICD-10-CM | POA: Diagnosis present

## 2023-05-30 DIAGNOSIS — E860 Dehydration: Secondary | ICD-10-CM | POA: Diagnosis present

## 2023-05-30 DIAGNOSIS — R54 Age-related physical debility: Secondary | ICD-10-CM | POA: Diagnosis present

## 2023-05-30 DIAGNOSIS — Z95828 Presence of other vascular implants and grafts: Secondary | ICD-10-CM

## 2023-05-30 DIAGNOSIS — E222 Syndrome of inappropriate secretion of antidiuretic hormone: Secondary | ICD-10-CM

## 2023-05-30 DIAGNOSIS — I82433 Acute embolism and thrombosis of popliteal vein, bilateral: Secondary | ICD-10-CM | POA: Diagnosis present

## 2023-05-30 DIAGNOSIS — Z79899 Other long term (current) drug therapy: Secondary | ICD-10-CM

## 2023-05-30 DIAGNOSIS — I82463 Acute embolism and thrombosis of calf muscular vein, bilateral: Secondary | ICD-10-CM | POA: Diagnosis present

## 2023-05-30 DIAGNOSIS — Z825 Family history of asthma and other chronic lower respiratory diseases: Secondary | ICD-10-CM

## 2023-05-30 DIAGNOSIS — Z515 Encounter for palliative care: Secondary | ICD-10-CM

## 2023-05-30 DIAGNOSIS — I959 Hypotension, unspecified: Secondary | ICD-10-CM | POA: Diagnosis present

## 2023-05-30 DIAGNOSIS — J189 Pneumonia, unspecified organism: Secondary | ICD-10-CM | POA: Diagnosis present

## 2023-05-30 DIAGNOSIS — C7931 Secondary malignant neoplasm of brain: Secondary | ICD-10-CM | POA: Diagnosis present

## 2023-05-30 DIAGNOSIS — J439 Emphysema, unspecified: Secondary | ICD-10-CM | POA: Diagnosis present

## 2023-05-30 DIAGNOSIS — I76 Septic arterial embolism: Secondary | ICD-10-CM | POA: Diagnosis present

## 2023-05-30 DIAGNOSIS — F17210 Nicotine dependence, cigarettes, uncomplicated: Secondary | ICD-10-CM | POA: Diagnosis present

## 2023-05-30 DIAGNOSIS — J9601 Acute respiratory failure with hypoxia: Secondary | ICD-10-CM | POA: Diagnosis present

## 2023-05-30 DIAGNOSIS — I2699 Other pulmonary embolism without acute cor pulmonale: Secondary | ICD-10-CM | POA: Diagnosis not present

## 2023-05-30 DIAGNOSIS — R1310 Dysphagia, unspecified: Secondary | ICD-10-CM | POA: Diagnosis present

## 2023-05-30 DIAGNOSIS — G893 Neoplasm related pain (acute) (chronic): Secondary | ICD-10-CM | POA: Diagnosis present

## 2023-05-30 DIAGNOSIS — Z833 Family history of diabetes mellitus: Secondary | ICD-10-CM

## 2023-05-30 DIAGNOSIS — R911 Solitary pulmonary nodule: Secondary | ICD-10-CM | POA: Diagnosis present

## 2023-05-30 DIAGNOSIS — Z1152 Encounter for screening for COVID-19: Secondary | ICD-10-CM

## 2023-05-30 DIAGNOSIS — M549 Dorsalgia, unspecified: Secondary | ICD-10-CM | POA: Diagnosis present

## 2023-05-30 DIAGNOSIS — D630 Anemia in neoplastic disease: Secondary | ICD-10-CM | POA: Diagnosis present

## 2023-05-30 DIAGNOSIS — M1990 Unspecified osteoarthritis, unspecified site: Secondary | ICD-10-CM | POA: Diagnosis present

## 2023-05-30 LAB — CBC WITH DIFFERENTIAL (CANCER CENTER ONLY)
Abs Immature Granulocytes: 0.7 10*3/uL — ABNORMAL HIGH (ref 0.00–0.07)
Basophils Absolute: 0 10*3/uL (ref 0.0–0.1)
Basophils Relative: 1 %
Eosinophils Absolute: 0 10*3/uL (ref 0.0–0.5)
Eosinophils Relative: 1 %
HCT: 28.8 % — ABNORMAL LOW (ref 39.0–52.0)
Hemoglobin: 10.2 g/dL — ABNORMAL LOW (ref 13.0–17.0)
Immature Granulocytes: 16 %
Lymphocytes Relative: 12 %
Lymphs Abs: 0.6 10*3/uL — ABNORMAL LOW (ref 0.7–4.0)
MCH: 29.9 pg (ref 26.0–34.0)
MCHC: 35.4 g/dL (ref 30.0–36.0)
MCV: 84.5 fL (ref 80.0–100.0)
Monocytes Absolute: 0.4 10*3/uL (ref 0.1–1.0)
Monocytes Relative: 8 %
Neutro Abs: 2.8 10*3/uL (ref 1.7–7.7)
Neutrophils Relative %: 62 %
Platelet Count: 153 10*3/uL (ref 150–400)
RBC: 3.41 MIL/uL — ABNORMAL LOW (ref 4.22–5.81)
RDW: 17.1 % — ABNORMAL HIGH (ref 11.5–15.5)
WBC Count: 4.5 10*3/uL (ref 4.0–10.5)
nRBC: 0.7 % — ABNORMAL HIGH (ref 0.0–0.2)

## 2023-05-30 LAB — CMP (CANCER CENTER ONLY)
ALT: 28 U/L (ref 0–44)
AST: 28 U/L (ref 15–41)
Albumin: 2.6 g/dL — ABNORMAL LOW (ref 3.5–5.0)
Alkaline Phosphatase: 243 U/L — ABNORMAL HIGH (ref 38–126)
Anion gap: 9 (ref 5–15)
BUN: 44 mg/dL — ABNORMAL HIGH (ref 8–23)
CO2: 26 mmol/L (ref 22–32)
Calcium: 7.9 mg/dL — ABNORMAL LOW (ref 8.9–10.3)
Chloride: 92 mmol/L — ABNORMAL LOW (ref 98–111)
Creatinine: 1.28 mg/dL — ABNORMAL HIGH (ref 0.61–1.24)
GFR, Estimated: >60 mL/min (ref >60)
Glucose, Bld: 131 mg/dL — ABNORMAL HIGH (ref 70–99)
Potassium: 4 mmol/L (ref 3.5–5.1)
Sodium: 127 mmol/L — ABNORMAL LOW (ref 135–145)
Total Bilirubin: 1.5 mg/dL — ABNORMAL HIGH (ref 0.3–1.2)
Total Protein: 6 g/dL — ABNORMAL LOW (ref 6.5–8.1)

## 2023-05-30 MED ORDER — SODIUM CHLORIDE 0.9 % IV SOLN: INTRAVENOUS | Status: DC

## 2023-05-30 MED ORDER — HEPARIN SOD (PORK) LOCK FLUSH 100 UNIT/ML IV SOLN
500.0000 [IU] | Freq: Once | INTRAVENOUS | Status: AC | PRN
  Administered 2023-05-30: 500 [IU]

## 2023-05-30 MED ORDER — SODIUM CHLORIDE 0.9% FLUSH
10.0000 mL | INTRAVENOUS | Status: DC | PRN
  Administered 2023-05-30: 10 mL

## 2023-05-30 MED ORDER — HEPARIN SOD (PORK) LOCK FLUSH 100 UNIT/ML IV SOLN: 500.0000 [IU] | Freq: Once | INTRAVENOUS | Status: DC | PRN

## 2023-05-30 NOTE — Addendum Note (Signed)
Addended by: Julieanne Manson on: 05/30/2023 04:12 PM   Modules accepted: Orders

## 2023-05-30 NOTE — Patient Instructions (Signed)

## 2023-06-01 ENCOUNTER — Telehealth (HOSPITAL_BASED_OUTPATIENT_CLINIC_OR_DEPARTMENT_OTHER): Payer: Self-pay | Admitting: Pulmonary Disease

## 2023-06-01 ENCOUNTER — Inpatient Hospital Stay: Payer: 59

## 2023-06-01 ENCOUNTER — Inpatient Hospital Stay (HOSPITAL_COMMUNITY)
Admission: EM | Admit: 2023-06-01 | Discharge: 2023-06-04 | DRG: 175 | Disposition: A | Discharge status: Home Health | Payer: 59 | Attending: Internal Medicine | Admitting: Internal Medicine

## 2023-06-01 ENCOUNTER — Encounter (HOSPITAL_COMMUNITY): Payer: Self-pay | Admitting: *Deleted

## 2023-06-01 ENCOUNTER — Inpatient Hospital Stay (HOSPITAL_COMMUNITY)
Admit: 2023-06-01 | Discharge: 2023-06-01 | Disposition: A | Discharge status: Home / Self Care | Payer: 59 | Attending: Internal Medicine | Admitting: Internal Medicine

## 2023-06-01 ENCOUNTER — Emergency Department (HOSPITAL_COMMUNITY): Payer: 59

## 2023-06-01 ENCOUNTER — Other Ambulatory Visit: Payer: Self-pay

## 2023-06-01 DIAGNOSIS — I82433 Acute embolism and thrombosis of popliteal vein, bilateral: Secondary | ICD-10-CM | POA: Diagnosis present

## 2023-06-01 DIAGNOSIS — G893 Neoplasm related pain (acute) (chronic): Secondary | ICD-10-CM | POA: Diagnosis present

## 2023-06-01 DIAGNOSIS — Z7189 Other specified counseling: Secondary | ICD-10-CM

## 2023-06-01 DIAGNOSIS — I824Y2 Acute embolism and thrombosis of unspecified deep veins of left proximal lower extremity: Secondary | ICD-10-CM | POA: Diagnosis present

## 2023-06-01 DIAGNOSIS — E222 Syndrome of inappropriate secretion of antidiuretic hormone: Secondary | ICD-10-CM | POA: Diagnosis present

## 2023-06-01 DIAGNOSIS — Z825 Family history of asthma and other chronic lower respiratory diseases: Secondary | ICD-10-CM | POA: Diagnosis not present

## 2023-06-01 DIAGNOSIS — I2699 Other pulmonary embolism without acute cor pulmonale: Secondary | ICD-10-CM | POA: Diagnosis not present

## 2023-06-01 DIAGNOSIS — R911 Solitary pulmonary nodule: Secondary | ICD-10-CM | POA: Diagnosis present

## 2023-06-01 DIAGNOSIS — J189 Pneumonia, unspecified organism: Secondary | ICD-10-CM

## 2023-06-01 DIAGNOSIS — R531 Weakness: Secondary | ICD-10-CM | POA: Diagnosis not present

## 2023-06-01 DIAGNOSIS — C3490 Malignant neoplasm of unspecified part of unspecified bronchus or lung: Secondary | ICD-10-CM | POA: Diagnosis present

## 2023-06-01 DIAGNOSIS — E860 Dehydration: Secondary | ICD-10-CM | POA: Diagnosis present

## 2023-06-01 DIAGNOSIS — Z515 Encounter for palliative care: Secondary | ICD-10-CM | POA: Diagnosis not present

## 2023-06-01 DIAGNOSIS — I82463 Acute embolism and thrombosis of calf muscular vein, bilateral: Secondary | ICD-10-CM | POA: Diagnosis present

## 2023-06-01 DIAGNOSIS — E785 Hyperlipidemia, unspecified: Secondary | ICD-10-CM | POA: Diagnosis present

## 2023-06-01 DIAGNOSIS — Z86711 Personal history of pulmonary embolism: Secondary | ICD-10-CM

## 2023-06-01 DIAGNOSIS — J9601 Acute respiratory failure with hypoxia: Secondary | ICD-10-CM

## 2023-06-01 DIAGNOSIS — C7931 Secondary malignant neoplasm of brain: Secondary | ICD-10-CM | POA: Diagnosis present

## 2023-06-01 DIAGNOSIS — Z1152 Encounter for screening for COVID-19: Secondary | ICD-10-CM | POA: Diagnosis not present

## 2023-06-01 DIAGNOSIS — J439 Emphysema, unspecified: Secondary | ICD-10-CM | POA: Diagnosis present

## 2023-06-01 DIAGNOSIS — I2693 Single subsegmental pulmonary embolism without acute cor pulmonale: Secondary | ICD-10-CM | POA: Diagnosis present

## 2023-06-01 DIAGNOSIS — Z833 Family history of diabetes mellitus: Secondary | ICD-10-CM | POA: Diagnosis not present

## 2023-06-01 DIAGNOSIS — D630 Anemia in neoplastic disease: Secondary | ICD-10-CM | POA: Diagnosis present

## 2023-06-01 DIAGNOSIS — Z79899 Other long term (current) drug therapy: Secondary | ICD-10-CM | POA: Diagnosis not present

## 2023-06-01 DIAGNOSIS — K219 Gastro-esophageal reflux disease without esophagitis: Secondary | ICD-10-CM | POA: Diagnosis present

## 2023-06-01 DIAGNOSIS — R54 Age-related physical debility: Secondary | ICD-10-CM | POA: Diagnosis present

## 2023-06-01 DIAGNOSIS — I76 Septic arterial embolism: Secondary | ICD-10-CM | POA: Diagnosis present

## 2023-06-01 DIAGNOSIS — F17210 Nicotine dependence, cigarettes, uncomplicated: Secondary | ICD-10-CM | POA: Diagnosis present

## 2023-06-01 LAB — CBC WITH DIFFERENTIAL/PLATELET
Abs Immature Granulocytes: 1.6 10*3/uL — ABNORMAL HIGH (ref 0.00–0.07)
Basophils Absolute: 0 10*3/uL (ref 0.0–0.1)
Basophils Relative: 0 %
Eosinophils Absolute: 0 10*3/uL (ref 0.0–0.5)
Eosinophils Relative: 0 %
HCT: 28.4 % — ABNORMAL LOW (ref 39.0–52.0)
Hemoglobin: 9.6 g/dL — ABNORMAL LOW (ref 13.0–17.0)
Lymphocytes Relative: 5 %
Lymphs Abs: 0.5 10*3/uL — ABNORMAL LOW (ref 0.7–4.0)
MCH: 29.7 pg (ref 26.0–34.0)
MCHC: 33.8 g/dL (ref 30.0–36.0)
MCV: 87.9 fL (ref 80.0–100.0)
Metamyelocytes Relative: 3 %
Monocytes Absolute: 0.4 10*3/uL (ref 0.1–1.0)
Monocytes Relative: 4 %
Myelocytes: 12 %
Neutro Abs: 7.4 10*3/uL (ref 1.7–7.7)
Neutrophils Relative %: 75 %
Platelets: 141 10*3/uL — ABNORMAL LOW (ref 150–400)
Promyelocytes Relative: 1 %
RBC: 3.23 MIL/uL — ABNORMAL LOW (ref 4.22–5.81)
RDW: 17.6 % — ABNORMAL HIGH (ref 11.5–15.5)
WBC: 9.9 10*3/uL (ref 4.0–10.5)
nRBC: 0.8 % — ABNORMAL HIGH (ref 0.0–0.2)

## 2023-06-01 LAB — URINALYSIS, W/ REFLEX TO CULTURE (INFECTION SUSPECTED)
Bacteria, UA: NONE SEEN
Bilirubin Urine: NEGATIVE
Glucose, UA: NEGATIVE mg/dL
Hgb urine dipstick: NEGATIVE
Ketones, ur: NEGATIVE mg/dL
Leukocytes,Ua: NEGATIVE
Nitrite: NEGATIVE
Protein, ur: NEGATIVE mg/dL
Specific Gravity, Urine: 1.041 — ABNORMAL HIGH (ref 1.005–1.030)
pH: 5 (ref 5.0–8.0)

## 2023-06-01 LAB — PROTIME-INR
INR: 1.4 — ABNORMAL HIGH (ref 0.8–1.2)
Prothrombin Time: 17.6 seconds — ABNORMAL HIGH (ref 11.4–15.2)

## 2023-06-01 LAB — COMPREHENSIVE METABOLIC PANEL
ALT: 31 U/L (ref 0–44)
AST: 30 U/L (ref 15–41)
Albumin: 1.9 g/dL — ABNORMAL LOW (ref 3.5–5.0)
Alkaline Phosphatase: 292 U/L — ABNORMAL HIGH (ref 38–126)
Anion gap: 12 (ref 5–15)
BUN: 39 mg/dL — ABNORMAL HIGH (ref 8–23)
CO2: 21 mmol/L — ABNORMAL LOW (ref 22–32)
Calcium: 7.4 mg/dL — ABNORMAL LOW (ref 8.9–10.3)
Chloride: 92 mmol/L — ABNORMAL LOW (ref 98–111)
Creatinine, Ser: 1.28 mg/dL — ABNORMAL HIGH (ref 0.61–1.24)
GFR, Estimated: >60 mL/min (ref >60)
Glucose, Bld: 108 mg/dL — ABNORMAL HIGH (ref 70–99)
Potassium: 3.8 mmol/L (ref 3.5–5.1)
Sodium: 125 mmol/L — ABNORMAL LOW (ref 135–145)
Total Bilirubin: 2.2 mg/dL — ABNORMAL HIGH (ref 0.3–1.2)
Total Protein: 5.8 g/dL — ABNORMAL LOW (ref 6.5–8.1)

## 2023-06-01 LAB — LACTIC ACID, PLASMA: Lactic Acid, Venous: 1.4 mmol/L (ref 0.5–1.9)

## 2023-06-01 LAB — RESP PANEL BY RT-PCR (RSV, FLU A&B, COVID)  RVPGX2
Influenza A by PCR: NEGATIVE
Influenza B by PCR: NEGATIVE
Resp Syncytial Virus by PCR: NEGATIVE
SARS Coronavirus 2 by RT PCR: NEGATIVE

## 2023-06-01 LAB — APTT: aPTT: 34 seconds (ref 24–36)

## 2023-06-01 LAB — MRSA NEXT GEN BY PCR, NASAL: MRSA by PCR Next Gen: NOT DETECTED

## 2023-06-01 LAB — I-STAT CG4 LACTIC ACID, ED: Lactic Acid, Venous: 1.5 mmol/L (ref 0.5–1.9)

## 2023-06-01 LAB — TROPONIN I (HIGH SENSITIVITY): Troponin I (High Sensitivity): 10 ng/L (ref <18)

## 2023-06-01 MED ORDER — ONDANSETRON HCL 4 MG/2ML IJ SOLN: 4.0000 mg | Freq: Four times a day (QID) | INTRAMUSCULAR | Status: DC | PRN

## 2023-06-01 MED ORDER — HEPARIN BOLUS VIA INFUSION
3800.0000 [IU] | Freq: Once | INTRAVENOUS | Status: DC
  Filled 2023-06-01: qty 3800

## 2023-06-01 MED ORDER — SODIUM CHLORIDE 0.9 % IV SOLN
1.0000 g | INTRAVENOUS | Status: DC
  Administered 2023-06-02 – 2023-06-03 (×2): 1 g via INTRAVENOUS
  Filled 2023-06-01 (×3): qty 10

## 2023-06-01 MED ORDER — CHLORHEXIDINE GLUCONATE CLOTH 2 % EX PADS
6.0000 | MEDICATED_PAD | Freq: Every day | CUTANEOUS | Status: DC
  Administered 2023-06-01 – 2023-06-03 (×3): 6 via TOPICAL

## 2023-06-01 MED ORDER — SODIUM CHLORIDE 0.9% FLUSH
10.0000 mL | Freq: Two times a day (BID) | INTRAVENOUS | Status: DC
  Administered 2023-06-02 – 2023-06-04 (×5): 10 mL

## 2023-06-01 MED ORDER — UMECLIDINIUM BROMIDE 62.5 MCG/ACT IN AEPB
1.0000 | INHALATION_SPRAY | Freq: Every day | RESPIRATORY_TRACT | Status: DC
  Administered 2023-06-02 – 2023-06-04 (×3): 1 via RESPIRATORY_TRACT
  Filled 2023-06-01: qty 7

## 2023-06-01 MED ORDER — HEPARIN (PORCINE) 25000 UT/250ML-% IV SOLN
1250.0000 [IU]/h | INTRAVENOUS | Status: DC
  Administered 2023-06-01: 1050 [IU]/h via INTRAVENOUS
  Filled 2023-06-01: qty 250

## 2023-06-01 MED ORDER — SODIUM CHLORIDE 0.9 % IV SOLN: 500.0000 mg | INTRAVENOUS | Status: DC

## 2023-06-01 MED ORDER — IOHEXOL 350 MG/ML SOLN
80.0000 mL | Freq: Once | INTRAVENOUS | Status: AC | PRN
  Administered 2023-06-01: 80 mL via INTRAVENOUS

## 2023-06-01 MED ORDER — LACTATED RINGERS IV BOLUS (SEPSIS)
1000.0000 mL | Freq: Once | INTRAVENOUS | Status: AC
  Administered 2023-06-01: 1000 mL via INTRAVENOUS

## 2023-06-01 MED ORDER — NICOTINE 14 MG/24HR TD PT24
14.0000 mg | MEDICATED_PATCH | Freq: Every day | TRANSDERMAL | Status: DC
  Administered 2023-06-01 – 2023-06-03 (×3): 14 mg via TRANSDERMAL
  Filled 2023-06-01 (×3): qty 1

## 2023-06-01 MED ORDER — SODIUM CHLORIDE 0.9 % IV SOLN: Freq: Once | INTRAVENOUS | Status: DC

## 2023-06-01 MED ORDER — PANTOPRAZOLE SODIUM 40 MG PO TBEC
40.0000 mg | DELAYED_RELEASE_TABLET | Freq: Two times a day (BID) | ORAL | Status: DC
  Administered 2023-06-01 – 2023-06-04 (×6): 40 mg via ORAL
  Filled 2023-06-01 (×6): qty 1

## 2023-06-01 MED ORDER — HYDROMORPHONE HCL 1 MG/ML IJ SOLN
0.5000 mg | Freq: Once | INTRAMUSCULAR | Status: AC
  Administered 2023-06-01: 0.5 mg via INTRAVENOUS
  Filled 2023-06-01: qty 1

## 2023-06-01 MED ORDER — SODIUM CHLORIDE 0.9 % IV SOLN: INTRAVENOUS | Status: DC

## 2023-06-01 MED ORDER — ONDANSETRON HCL 4 MG PO TABS: 4.0000 mg | ORAL_TABLET | Freq: Four times a day (QID) | ORAL | Status: DC | PRN

## 2023-06-01 MED ORDER — SODIUM CHLORIDE 0.9 % IV SOLN
500.0000 mg | Freq: Once | INTRAVENOUS | Status: AC
  Administered 2023-06-01: 500 mg via INTRAVENOUS
  Filled 2023-06-01: qty 5

## 2023-06-01 MED ORDER — SODIUM CHLORIDE 0.9% FLUSH: 10.0000 mL | INTRAVENOUS | Status: DC | PRN

## 2023-06-01 MED ORDER — HYDROCODONE-ACETAMINOPHEN 10-325 MG PO TABS
1.0000 | ORAL_TABLET | ORAL | Status: DC | PRN
  Administered 2023-06-01 – 2023-06-02 (×2): 1 via ORAL
  Filled 2023-06-01 (×2): qty 1

## 2023-06-01 MED ORDER — ACETAMINOPHEN 325 MG PO TABS
650.0000 mg | ORAL_TABLET | Freq: Four times a day (QID) | ORAL | Status: DC | PRN
  Administered 2023-06-02 – 2023-06-03 (×3): 650 mg via ORAL
  Filled 2023-06-01 (×3): qty 2

## 2023-06-01 MED ORDER — ORAL CARE MOUTH RINSE: 15.0000 mL | OROMUCOSAL | Status: DC | PRN

## 2023-06-01 MED ORDER — ACETAMINOPHEN 650 MG RE SUPP: 650.0000 mg | Freq: Four times a day (QID) | RECTAL | Status: DC | PRN

## 2023-06-01 MED ORDER — TAMSULOSIN HCL 0.4 MG PO CAPS: 0.4000 mg | ORAL_CAPSULE | Freq: Every evening | ORAL | Status: DC | PRN

## 2023-06-01 MED ORDER — SODIUM CHLORIDE 0.9 % IV SOLN
1.0000 g | Freq: Once | INTRAVENOUS | Status: AC
  Administered 2023-06-01: 1 g via INTRAVENOUS
  Filled 2023-06-01: qty 10

## 2023-06-01 MED ORDER — ALBUTEROL SULFATE (2.5 MG/3ML) 0.083% IN NEBU
2.5000 mg | INHALATION_SOLUTION | RESPIRATORY_TRACT | Status: DC | PRN
  Administered 2023-06-02 – 2023-06-03 (×3): 2.5 mg via RESPIRATORY_TRACT
  Filled 2023-06-01 (×3): qty 3

## 2023-06-01 MED ORDER — SODIUM CHLORIDE (PF) 0.9 % IJ SOLN
INTRAMUSCULAR | Status: AC
  Filled 2023-06-01: qty 50

## 2023-06-01 MED ORDER — ARFORMOTEROL TARTRATE 15 MCG/2ML IN NEBU
15.0000 ug | INHALATION_SOLUTION | Freq: Two times a day (BID) | RESPIRATORY_TRACT | Status: DC
  Administered 2023-06-01 – 2023-06-04 (×6): 15 ug via RESPIRATORY_TRACT
  Filled 2023-06-01 (×6): qty 2

## 2023-06-01 NOTE — ED Triage Notes (Signed)
BIB wife, sent from Cancer center for sob and low SPO2 on RA (89%). H/o L lung CA/tumor. R chest port present. Cancer center reports HR 130, 89% RA, poor PO intake. Pt was coming in for IVFs today at [facility]. sBP 89 upon arrival to ED. Denies fever, NV. Alert, NAD, calm, interactive.

## 2023-06-01 NOTE — Plan of Care (Signed)
He is positive for bilateral DVT.  Maryanna Shape, PA in ER discussed with [name] of neurosurgery who states no contraindication to anticoagulation at routine dose.

## 2023-06-01 NOTE — ED Notes (Signed)
Pt placed on 2L [place] due to low O2 sat

## 2023-06-01 NOTE — Progress Notes (Signed)
Bilateral lower extremity venous duplex has been completed. Preliminary results can be found in CV Proc through chart review.  Results were given to Dr. Kirby Crigler.  06/01/23 3:54 PM Olen Cordial RVT

## 2023-06-01 NOTE — Progress Notes (Signed)
Pt came in c/o worsening SOB over last two days. HR 130s, O2 80% on RA. Pt put on oxygen and transported to ED for further evaluation.

## 2023-06-01 NOTE — H&P (Addendum)
History and Physical  Matthew Rivera YQM:[id number] DOB: 05/09/1961 DOA: 06/01/2023  PCP: [name], MD   Chief Complaint: Shortness of breath  HPI: Matthew Rivera is a 62 y.o. male with medical history significant for tobacco abuse, GERD, non-small cell lung adenocarcinoma with brain metastasis status post craniotomy 05/10/2023 as well as intracranial radiation currently undergoing chemotherapy being admitted to the hospital with acute hypoxic respiratory failure due to community-acquired pneumonia and pulmonary embolism.  History is provided by the patient and his wife, who states that he was already somewhat weak and dealing with nausea from his chemotherapy, but in the last couple of days he has been having worsening right sided chest pain which is pleuritic.  He has also been feeling more short of breath, especially with exertion.  He has had a nonproductive cough for the last 2 to 3 days as well.  No fevers at home, no vomiting, no sputum production.  Came to the cancer clinic today for symptom management and did receive some IV fluids, was noted to be hypoxic 80% on room air, heart rate in the 130s and transported to the ED for evaluation.  ED Course: On evaluation in the emergency department, he has been afebrile, he has been tachycardic and hypotensive as low as 86/64.  He was placed on 2 L nasal cannula oxygen and is now saturating well.  Lab work was done and shows stable anemia, new worsening hyponatremia, normal lactate.  Normal troponin.  CT chest as noted below shows acute PE without right heart strain, and associated scattered consolidation.  Given empiric IV azithromycin, IV Rocephin.  ER provider has paged out to neurosurgery to see whether he can be safely anticoagulated.  Has also consulted pulmonary critical care for treatment of his PE, and hospitalist for admission.  Review of Systems: Please see HPI for pertinent positives and negatives. A complete 10 system review of systems are  otherwise negative.  Past Medical History:  Diagnosis Date   Dyslipidemia    GERD (gastroesophageal reflux disease)    Lung nodule seen on imaging study 03/31/2023   seen on CT results   Osteoarthritis    Smoker    long term smoker 1 ppd   Past Surgical History:  Procedure Laterality Date   BACK SURGERY     x 1   COLONOSCOPY WITH PROPOFOL N/A 08/18/2020   Procedure: COLONOSCOPY WITH PROPOFOL;  Surgeon: Lanelle Bal, DO;  Location: [facility];  Service: Endoscopy;  Laterality: N/A;  7:30am   CRANIOTOMY Right 04/07/2023   Procedure: Right suboccipital craniotomy for tumor resection;  Surgeon: Tressie Stalker, MD;  Location: [facility] OR;  Service: Neurosurgery;  Laterality: Right;  TO follow   IR IMAGING GUIDED PORT INSERTION  05/12/2023   MULTIPLE TOOTH EXTRACTIONS     wears full dentures   POLYPECTOMY  08/18/2020   Procedure: POLYPECTOMY INTESTINAL;  Surgeon: Lanelle Bal, DO;  Location: [facility];  Service: Endoscopy;;    Social History:  reports that he has been smoking cigarettes. He started smoking about 52 years ago. He has a 78.3 pack-year smoking history. He has never used smokeless tobacco. He reports that he does not currently use alcohol. He reports that he does not use drugs.   No Known Allergies  Family History  Problem Relation Age of Onset   Diabetes Father    Asthma Father      Prior to Admission medications   Medication Sig Start Date End Date Taking? Authorizing  Provider  dexamethasone (DECADRON) 2 MG tablet Take 1 tablet twice a day for 1 week, followed by 1 tablet daily for 1 week, then stop 05/04/23   Heilingoetter, Cassandra L, PA-C  fluconazole (DIFLUCAN) 100 MG tablet Take 2 tablets today, then 1 tablet daily x 20 more days. Do not take any statin medications for cholesterol while on this. 05/06/23   Lonie Peak, MD  folic acid (FOLVITE) 1 MG tablet Take 1 tablet (1 mg total) by mouth daily. 05/04/23   Heilingoetter, Cassandra L, PA-C   HYDROcodone-acetaminophen (NORCO) 10-325 MG tablet Take 1 tablet by mouth every 4 (four) hours as needed for moderate pain.    [provider]  lidocaine-prilocaine (EMLA) cream Apply 1 Application topically as needed. 05/04/23   Heilingoetter, Cassandra L, PA-C  LORazepam (ATIVAN) 0.5 MG tablet Take 1 tablet (0.5 mg total) by mouth as needed for anxiety. Take 1-2 tablet by mouth 20 minutes before MRI or radiation treatment. 05/03/23   Erven Colla, PA-C  Naphazoline-Pheniramine (VISINE OP) Place 1 drop into both eyes daily as needed (dry eyes).    [provider]  nicotine (NICODERM CQ - DOSED IN MG/24 HOURS) 14 mg/24hr patch Place 1 patch (14 mg total) onto the skin daily. 05/10/23   Charlott Holler, MD  ondansetron (ZOFRAN-ODT) 4 MG disintegrating tablet Take 1 tablet (4 mg total) by mouth every 8 (eight) hours as needed for nausea or vomiting. 05/24/23   Heilingoetter, Cassandra L, PA-C  OVER THE COUNTER MEDICATION Take 1 tablet by mouth daily as needed (constipation). Stool softner    [provider]  pantoprazole (PROTONIX) 40 MG tablet Take 1 tablet (40 mg total) by mouth 2 (two) times daily. 04/01/23   Rhetta Mura, MD  prochlorperazine (COMPAZINE) 10 MG tablet Take 1 tablet (10 mg total) by mouth every 6 (six) hours as needed. 05/04/23   Heilingoetter, Cassandra L, PA-C  tamsulosin (FLOMAX) 0.4 MG CAPS capsule Take 0.4 mg by mouth at bedtime as needed (urinary flow issues). 03/01/23   [provider]  Tiotropium Bromide-Olodaterol (STIOLTO RESPIMAT) 2.5-2.5 MCG/ACT AERS Inhale 2 puffs into the lungs daily. 05/10/23   Charlott Holler, MD  traMADol (ULTRAM) 50 MG tablet Take 50-100 mg by mouth 4 (four) times daily as needed for moderate pain. 07/11/20   [provider]  zolpidem (AMBIEN) 10 MG tablet Take 10 mg by mouth at bedtime as needed for sleep. 12/22/22   [provider]    Physical Exam: BP 95/65   Pulse 96   Temp 97.6 F (36.4  C)   Resp 20   Wt 63.6 kg   SpO2 98%   BMI 19.56 kg/m   General: Alert, oriented x 3, resting comfortably on 2 L nasal cannula oxygen.  Looks a little bit uncomfortable, slightly tachypneic, wife at the bedside.  He is not in any acute distress. Eyes: EOMI, clear conjuctivae, white sclerea Neck: supple, no masses, trachea mildline  Cardiovascular: Tachycardic and regular, no murmurs or rubs, no peripheral edema  Respiratory: clear to auscultation bilaterally, no wheezes, no crackles  Abdomen: soft, nontender, nondistended, normal bowel tones heard  Skin: dry, no rashes  Musculoskeletal: no joint effusions, normal range of motion  Psychiatric: appropriate affect, normal speech  Neurologic: extraocular muscles intact, clear speech, moving all extremities with intact sensorium          Labs on Admission:  Basic Metabolic Panel: Recent Labs  Lab 05/30/23 1356 06/01/23 0849  NA 127* 125*  K 4.0 3.8  CL 92* 92*  CO2 26 21*  GLUCOSE 131* 108*  BUN 44* 39*  CREATININE 1.28* 1.28*  CALCIUM 7.9* 7.4*   Liver Function Tests: Recent Labs  Lab 05/30/23 1356 06/01/23 0849  AST 28 30  ALT 28 31  ALKPHOS 243* 292*  BILITOT 1.5* 2.2*  PROT 6.0* 5.8*  ALBUMIN 2.6* 1.9*   No results for input(s): "LIPASE", "AMYLASE" in the last 168 hours. No results for input(s): "AMMONIA" in the last 168 hours. CBC: Recent Labs  Lab 05/30/23 1356 06/01/23 0849  WBC 4.5 9.9  NEUTROABS 2.8 7.4  HGB 10.2* 9.6*  HCT 28.8* 28.4*  MCV 84.5 87.9  PLT 153 141*   Cardiac Enzymes: No results for input(s): "CKTOTAL", "CKMB", "CKMBINDEX", "TROPONINI" in the last 168 hours.  BNP (last 3 results) No results for input(s): "BNP" in the last 8760 hours.  ProBNP (last 3 results) No results for input(s): "PROBNP" in the last 8760 hours.  CBG: No results for input(s): "GLUCAP" in the last 168 hours.  Radiological Exams on Admission: CT Angio Chest PE W and/or Wo Contrast  Result Date:  06/01/2023 CLINICAL DATA:  History of left lower lobe non-small-cell lung cancer with shortness of breath, hypoxia, and tachycardia EXAM: CT ANGIOGRAPHY CHEST WITH CONTRAST TECHNIQUE: Multidetector CT imaging of the chest was performed using the standard protocol during bolus administration of intravenous contrast. Multiplanar CT image reconstructions and MIPs were obtained to evaluate the vascular anatomy. RADIATION DOSE REDUCTION: This exam was performed according to the departmental dose-optimization program which includes automated exposure control, adjustment of the mA and/or kV according to patient size and/or use of iterative reconstruction technique. CONTRAST:  80mL OMNIPAQUE IOHEXOL 350 MG/ML SOLN COMPARISON:  CT chest dated 03/31/2023, nuclear medicine PET dated 04/29/2023 FINDINGS: Cardiovascular: Right chest wall port terminates in the lower SVC. The study is high quality for the evaluation of pulmonary embolism. Filling defect within right upper lobe anterior segmental artery. Great vessels are normal in course and caliber. Normal heart size. No significant pericardial fluid/thickening. Coronary artery calcifications and aortic atherosclerosis. Mediastinum/Nodes: Imaged thyroid gland without nodules meeting criteria for imaging follow-up by size. Normal esophagus. No substantial change in size of subcarinal 1.7 cm lymph node (5:85). New multi station lymphadenopathy, for example 1.3 cm AP window (5:64), 1.0 cm left hilar (5:84), and 1.1 cm right hilar (5:85). Lungs/Pleura: The central airways are patent. Lower lobe predominant peripheral ground-glass opacities and irregular consolidations throughout all lobes. New multifocal thick-walled cavitary lesions, for example central left upper lobe measuring 1.9 x 1.2 cm (13:57) no substantial change in size of dominant basilar left lower lobe irregular mass measuring 4.6 x 2.1 cm (remeasured, 13:116). Slightly decreased size of subpleural right lower lobe  nodule measuring 2.2 x 1.3 cm (13:122), previously 2.4 x 1.5 cm. No pneumothorax. No pleural effusion. Upper abdomen: Unchanged enlarged spleen, 13.8 cm in AP dimension. Cholelithiasis. Musculoskeletal: No acute or abnormal lytic or blastic osseous lesions. Review of the MIP images confirms the above findings. IMPRESSION: 1. Filling defect within right upper lobe anterior segmental artery, consistent with pulmonary embolism. No evidence of right heart strain. 2. Lower lobe predominant peripheral ground-glass opacities and irregular consolidations throughout all lobes, with new multifocal thick-walled cavitary lesions. Findings are most consistent with multifocal pneumonia with suspicion for concurrent septic emboli. 3. No substantial change in size of dominant basilar left lower lobe irregular mass measuring 4.6 x 2.1 cm. Slightly decreased size of subpleural right lower lobe nodule measuring  2.2 x 1.3 cm. 4. New multi station lymphadenopathy, likely reactive. 5. Unchanged splenomegaly. 6. Aortic Atherosclerosis (ICD10-I70.0). Coronary artery calcifications. Assessment for potential risk factor modification, dietary therapy or pharmacologic therapy may be warranted, if clinically indicated. Critical Value/emergent results were called by telephone at the time of interpretation on 06/01/2023 at 2:06 pm to provider [facility] , who verbally acknowledged these results. Electronically Signed   By: Agustin Cree M.D.   On: 06/01/2023 14:13   DG Chest Port 1 View  Result Date: 06/01/2023 CLINICAL DATA:  Questionable sepsis.  Chest soreness EXAM: PORTABLE CHEST 1 VIEW COMPARISON:  X-ray 01/15/2011.  PET-CT 04/29/2023 FINDINGS: No pneumothorax or effusion. Normal cardiopericardial silhouette. No edema. Overlapping cardiac leads. Right IJ chest port with tip overlying the central SVC. The port is accessed multifocal consolidative opacities identified greatest in the medial right lung base also more peripherally in the right  midlung and towards the lingula. Patient does have a history of lung cancer IMPRESSION: Chest port. Multifocal parenchymal opacities along the lower lungs. Possible acute infiltrate. Recommend follow-up. Please correlate with the history of known lung cancer in the prior PET CT of 04/29/2023. Recommend follow up Electronically Signed   By: Karen Kays M.D.   On: 06/01/2023 09:53    Assessment/Plan Matthew Rivera is a 62 y.o. male with medical history significant for tobacco abuse, GERD, non-small cell lung adenocarcinoma with brain metastasis status post craniotomy 05/10/2023 as well as intracranial radiation currently undergoing chemotherapy being admitted to the hospital with acute hypoxic respiratory failure due to community-acquired pneumonia and pulmonary embolism.   Acute hypoxic respiratory failure-likely due to combination of community-acquired pneumonia and pulmonary embolism. -Inpatient admission to stepdown -Continue supplemental oxygen, wean as tolerated  Acute pulmonary embolism-without evidence of right heart strain -Supplemental oxygen as above -Anticipate initiation of IV heparin drip, if okay with neurosurgery -Appreciate pulmonary consultation -Check bilateral lower extremity Dopplers, as able change management if neurosurgery recommends against anticoagulation, and he has DVTs.  Community-acquired pneumonia-with cough, not septic -Supplemental oxygen as above -Empiric IV azithromycin and IV Rocephin  Hyponatremia-likely due to poor oral intake, and exacerbated by his pulmonary infection -Hydrate with normal saline -Follow every 8 hour BMP for the next 24 hours -Will fluid restrict  Metastatic lung adenocarcinoma -Norco for cancer pain -His oncologist Dr. Arbutus Ped added to treatment team  GERD-Protonix  DVT prophylaxis: Will be on anticoagulation pending neurosurgery clearance as above.  SCDs in the meantime.    Code Status: Full Code  Consults called: EDP has  consulted neurosurgery and pulmonary as stated above.  Admission status: The appropriate patient status for this patient is INPATIENT. Inpatient status is judged to be reasonable and necessary in order to provide the required intensity of service to ensure the patient's safety. The patient's presenting symptoms, physical exam findings, and initial radiographic and laboratory data in the context of their chronic comorbidities is felt to place them at high risk for further clinical deterioration. Furthermore, it is not anticipated that the patient will be medically stable for discharge from the hospital within 2 midnights of admission.    I certify that at the point of admission it is my clinical judgment that the patient will require inpatient hospital care spanning beyond 2 midnights from the point of admission due to high intensity of service, high risk for further deterioration and high frequency of surveillance required  Time spent: 53 minutes  Nakeya Adinolfi Sharlette Dense MD [facility] Pager [phone]  If 7PM-7AM, please contact  night-coverage [email or web address] Password [facility]  06/01/2023, 3:06 PM

## 2023-06-01 NOTE — ED Notes (Addendum)
Port accessed. Blood return sluggish. Pt verbalizes occurred the same yesterday. Xray finished at [facility]. Given cup of ice and warm blanket per request.

## 2023-06-01 NOTE — ED Notes (Signed)
Pt to ct via stretcher

## 2023-06-01 NOTE — ED Provider Notes (Signed)
Patient is a handoff from [employer], [place]. Patient is already admitted. Just waiting for neurosurgery to return call regarding patient given recent craniotomy and if he is okay to heparinize given newly diagnosed PE. Inform Dr. Kirby Crigler afterwards. Physical Exam  BP 98/70   Pulse (!) 101   Temp (!) 97.5 F (36.4 C)   Resp 16   Wt 63.6 kg   SpO2 100%   BMI 19.56 kg/m   Physical Exam  Procedures  Procedures  ED Course / MDM    Medical Decision Making Amount and/or Complexity of Data Reviewed Labs: ordered. Radiology: ordered. ECG/medicine tests: ordered.  Risk Prescription drug management. Decision regarding hospitalization.   Patient is a handoff from [employer], [place]. Please see their note for full HPI and physical exam findings. Patient is already admitted. Just waiting for neurosurgery to return call regarding patient given recent craniotomy and if he is okay to heparinize given newly diagnosed PE. Inform Dr. Kirby Crigler afterwards.  Spoke with [name], neurosurgery, and discussed patient case given somewhat recent craniotomy about 2 months ago and if patient is able to be heparinized given newly diagnosed PE. Advised that given patient is 2 months post-op, heparinizing should be safe at standard dosing. Will inform Dr. Kirby Crigler.  Informed Dr. Kirby Crigler about recommendations from [name]. Patient will proceed with admission for PE.       Smitty Knudsen, PA-C 06/01/23 1606    Gloris Manchester, MD 06/01/23 [phone]

## 2023-06-01 NOTE — ED Notes (Signed)
Unable to get urine sample thus far, will try again.

## 2023-06-01 NOTE — ED Notes (Signed)
Oral temp did not register, pt chewing ice. Axillary temp did not register. Will try again.

## 2023-06-01 NOTE — ED Notes (Signed)
ED TO INPATIENT HANDOFF REPORT  ED Nurse Name and Phone #: Thamas Jaegers Name/Age/Gender Matthew Rivera 62 y.o. male Room/Bed: WA16/WA16  Code Status   Code Status: Full Code  Home/SNF/Other Home Patient oriented to: self, place, time, and situation Is this baseline? Yes   Triage Complete: Triage complete  Chief Complaint Acute pulmonary embolism (HCC) [I26.99]  Triage Note BIB wife, sent from Cancer center for sob and low SPO2 on RA (89%). H/o L lung CA/tumor. R chest port present. Cancer center reports HR 130, 89% RA, poor PO intake. Pt was coming in for IVFs today at [facility]. sBP 89 upon arrival to ED. Denies fever, NV. Alert, NAD, calm, interactive.     Allergies No Known Allergies  Level of Care/Admitting Diagnosis ED Disposition     ED Disposition  Admit   Condition  --   Comment  Hospital Area: [facility] [[id number]]  Level of Care: Stepdown [14]  Admit to SDU based on following criteria: Severe physiological/psychological symptoms:  Any diagnosis requiring assessment & intervention at least every 4 hours on an ongoing basis to obtain desired patient outcomes including stability and rehabilitation  May admit patient to [facility] or [facility] if equivalent level of care is available:: Yes  Covid Evaluation: Asymptomatic - no recent exposure (last 10 days) testing not required  Diagnosis: Acute pulmonary embolism [facility]) [[id number]]  Admitting Physician: Maryln Gottron [[id number]]  Attending Physician: Olexa.Dam, MIR Jaxson.Roy [[id number]]  Certification:: I certify this patient will need inpatient services for at least 2 midnights  Expected Medical Readiness: 06/04/2023          B Medical/Surgery History Past Medical History:  Diagnosis Date   Dyslipidemia    GERD (gastroesophageal reflux disease)    Lung nodule seen on imaging study 03/31/2023   seen on CT results   Osteoarthritis    Smoker    long term smoker 1 ppd   Past Surgical History:   Procedure Laterality Date   BACK SURGERY     x 1   COLONOSCOPY WITH PROPOFOL N/A 08/18/2020   Procedure: COLONOSCOPY WITH PROPOFOL;  Surgeon: Lanelle Bal, DO;  [facility];  Service: Endoscopy;  Laterality: N/A;  7:30am   CRANIOTOMY Right 04/07/2023   Procedure: Right suboccipital craniotomy for tumor resection;  Surgeon: Tressie Stalker, MD;  Location: [facility] OR;  Service: Neurosurgery;  Laterality: Right;  TO follow   IR IMAGING GUIDED PORT INSERTION  05/12/2023   MULTIPLE TOOTH EXTRACTIONS     wears full dentures   POLYPECTOMY  08/18/2020   Procedure: POLYPECTOMY INTESTINAL;  Surgeon: Lanelle Bal, DO;  Location: [facility];  Service: Endoscopy;;     A IV Location/Drains/Wounds Patient Lines/Drains/Airways Status     Active Line/Drains/Airways     Name Placement date Placement time Site Days   Implanted Port 05/12/23 Right Chest 05/12/23  1206  Chest  20   Peripheral IV 06/01/23 18 G 1" Left;Medial Antecubital 06/01/23  0915  Antecubital  less than 1            Intake/Output Last 24 hours  Intake/Output Summary (Last 24 hours) at 06/01/2023 1511 Last data filed at 06/01/2023 1502 Gross per 24 hour  Intake 1100.09 ml  Output --  Net 1100.09 ml    Labs/Imaging Results for orders placed or performed during the hospital encounter of 06/01/23 (from the past 48 hour(s))  Lactic acid, plasma     Status: None  Collection Time: 06/01/23  8:48 AM  Result Value Ref Range   Lactic Acid, Venous 1.4 0.5 - 1.9 mmol/L    Comment: Performed at [facility], [address], [place], [place] [address]  Comprehensive metabolic panel     Status: Abnormal   Collection Time: 06/01/23  8:49 AM  Result Value Ref Range   Sodium 125 (L) 135 - 145 mmol/L   Potassium 3.8 3.5 - 5.1 mmol/L   Chloride 92 (L) 98 - 111 mmol/L   CO2 21 (L) 22 - 32 mmol/L   Glucose, Bld 108 (H) 70 - 99 mg/dL    Comment: Glucose reference range applies only to samples  taken after fasting for at least 8 hours.   BUN 39 (H) 8 - 23 mg/dL   Creatinine, Ser 4.03 (H) 0.61 - 1.24 mg/dL   Calcium 7.4 (L) 8.9 - 10.3 mg/dL   Total Protein 5.8 (L) 6.5 - 8.1 g/dL   Albumin 1.9 (L) 3.5 - 5.0 g/dL   AST 30 15 - 41 U/L   ALT 31 0 - 44 U/L   Alkaline Phosphatase 292 (H) 38 - 126 U/L   Total Bilirubin 2.2 (H) 0.3 - 1.2 mg/dL   GFR, Estimated >47 >42 mL/min    Comment: (NOTE) Calculated using the CKD-EPI Creatinine Equation (2021)    Anion gap 12 5 - 15    Comment: Performed at [facility], [address] [place], [place] [address]  CBC with Differential     Status: Abnormal   Collection Time: 06/01/23  8:49 AM  Result Value Ref Range   WBC 9.9 4.0 - 10.5 K/uL   RBC 3.23 (L) 4.22 - 5.81 MIL/uL   Hemoglobin 9.6 (L) 13.0 - 17.0 g/dL   HCT 87.5 (L) 64.3 - 32.9 %   MCV 87.9 80.0 - 100.0 fL   MCH 29.7 26.0 - 34.0 pg   MCHC 33.8 30.0 - 36.0 g/dL   RDW 51.8 (H) 84.1 - 66.0 %   Platelets 141 (L) 150 - 400 K/uL   nRBC 0.8 (H) 0.0 - 0.2 %   Neutrophils Relative % 75 %   Neutro Abs 7.4 1.7 - 7.7 K/uL   Lymphocytes Relative 5 %   Lymphs Abs 0.5 (L) 0.7 - 4.0 K/uL   Monocytes Relative 4 %   Monocytes Absolute 0.4 0.1 - 1.0 K/uL   Eosinophils Relative 0 %   Eosinophils Absolute 0.0 0.0 - 0.5 K/uL   Basophils Relative 0 %   Basophils Absolute 0.0 0.0 - 0.1 K/uL   WBC Morphology DOHLE BODIES     Comment: Moderate Left Shift (>5% metas and myelos)   Metamyelocytes Relative 3 %   Myelocytes 12 %   Promyelocytes Relative 1 %   Abs Immature Granulocytes 1.60 (H) 0.00 - 0.07 K/uL   Polychromasia PRESENT     Comment: Performed at [facility], [address] [place], [place] [address]  Protime-INR     Status: Abnormal   Collection Time: 06/01/23  8:49 AM  Result Value Ref Range   Prothrombin Time 17.6 (H) 11.4 - 15.2 seconds   INR 1.4 (H) 0.8 - 1.2    Comment: (NOTE) INR goal varies based on device and disease  states. Performed at [facility], [address], [place], [place] [address]   APTT     Status: None   Collection Time: 06/01/23  8:49 AM  Result Value Ref Range   aPTT  34 24 - 36 seconds    Comment: Performed at [facility], [address] [place], [place] [address]  Troponin I (High Sensitivity)     Status: None   Collection Time: 06/01/23  8:49 AM  Result Value Ref Range   Troponin I (High Sensitivity) 10 <18 ng/L    Comment: (NOTE) Elevated high sensitivity troponin I (hsTnI) values and significant  changes across serial measurements may suggest ACS but many other  chronic and acute conditions are known to elevate hsTnI results.  Refer to the "Links" section for chest pain algorithms and additional  guidance. Performed at [facility], [address], [place], [place] [address]   I-Stat Lactic Acid, ED     Status: None   Collection Time: 06/01/23  9:39 AM  Result Value Ref Range   Lactic Acid, Venous 1.5 0.5 - 1.9 mmol/L  Resp panel by RT-PCR (RSV, Flu A&B, Covid) Anterior Nasal Swab     Status: None   Collection Time: 06/01/23 10:37 AM   Specimen: Anterior Nasal Swab  Result Value Ref Range   SARS Coronavirus 2 by RT PCR NEGATIVE NEGATIVE    Comment: (NOTE) SARS-CoV-2 target nucleic acids are NOT DETECTED.  The SARS-CoV-2 RNA is generally detectable in upper respiratory specimens during the acute phase of infection. The lowest concentration of SARS-CoV-2 viral copies this assay can detect is 138 copies/mL. A negative result does not preclude SARS-Cov-2 infection and should not be used as the sole basis for treatment or other patient management decisions. A negative result may occur with  improper specimen collection/handling, submission of specimen other than nasopharyngeal swab, presence of viral mutation(s) within the areas targeted by this assay, and inadequate number of viral copies(<138 copies/mL). A  negative result must be combined with clinical observations, patient history, and epidemiological information. The expected result is Negative.  Fact Sheet for Patients:  BloggerCourse.com  Fact Sheet for Healthcare Providers:  SeriousBroker.it  This test is no t yet approved or cleared by the [place] FDA and  has been authorized for detection and/or diagnosis of SARS-CoV-2 by FDA under an Emergency Use Authorization (EUA). This EUA will remain  in effect (meaning this test can be used) for the duration of the COVID-19 declaration under Section 564(b)(1) of the Act, 21 U.S.C.section 360bbb-3(b)(1), unless the authorization is terminated  or revoked sooner.       Influenza A by PCR NEGATIVE NEGATIVE   Influenza B by PCR NEGATIVE NEGATIVE    Comment: (NOTE) The Xpert Xpress SARS-CoV-2/FLU/RSV plus assay is intended as an aid in the diagnosis of influenza from Nasopharyngeal swab specimens and should not be used as a sole basis for treatment. Nasal washings and aspirates are unacceptable for Xpert Xpress SARS-CoV-2/FLU/RSV testing.  Fact Sheet for Patients: BloggerCourse.com  Fact Sheet for Healthcare Providers: SeriousBroker.it  This test is not yet approved or cleared by the [place] FDA and has been authorized for detection and/or diagnosis of SARS-CoV-2 by FDA under an Emergency Use Authorization (EUA). This EUA will remain in effect (meaning this test can be used) for the duration of the COVID-19 declaration under Section 564(b)(1) of the Act, 21 U.S.C. section 360bbb-3(b)(1), unless the authorization is terminated or revoked.     Resp Syncytial Virus by PCR NEGATIVE NEGATIVE    Comment: (NOTE) Fact Sheet for Patients: BloggerCourse.com  Fact Sheet for Healthcare Providers: SeriousBroker.it  This test is not  yet approved or cleared by the [place] and  has been authorized for detection and/or diagnosis of SARS-CoV-2 by FDA under an Emergency Use Authorization (EUA). This EUA will remain in effect (meaning this test can be used) for the duration of the COVID-19 declaration under Section 564(b)(1) of the Act, 21 U.S.C. section 360bbb-3(b)(1), unless the authorization is terminated or revoked.  Performed at [facility], [address], [place], [place] [address]   Urinalysis, w/ Reflex to Culture (Infection Suspected) -Urine, Clean Catch     Status: Abnormal   Collection Time: 06/01/23  1:16 PM  Result Value Ref Range   Specimen Source URINE, CLEAN CATCH    Color, Urine YELLOW YELLOW   APPearance CLEAR CLEAR   Specific Gravity, Urine 1.041 (H) 1.005 - 1.030   pH 5.0 5.0 - 8.0   Glucose, UA NEGATIVE NEGATIVE mg/dL   Hgb urine dipstick NEGATIVE NEGATIVE   Bilirubin Urine NEGATIVE NEGATIVE   Ketones, ur NEGATIVE NEGATIVE mg/dL   Protein, ur NEGATIVE NEGATIVE mg/dL   Nitrite NEGATIVE NEGATIVE   Leukocytes,Ua NEGATIVE NEGATIVE   RBC / HPF 0-5 0 - 5 RBC/hpf   WBC, UA 0-5 0 - 5 WBC/hpf    Comment:        Reflex urine culture not performed if WBC <=10, OR if Squamous epithelial cells >5. If Squamous epithelial cells >5 suggest recollection.    Bacteria, UA NONE SEEN NONE SEEN   Squamous Epithelial / HPF 0-5 0 - 5 /HPF    Comment: Performed at [facility], [address], [place], [place] [address]   CT Angio Chest PE W and/or Wo Contrast  Result Date: 06/01/2023 CLINICAL DATA:  History of left lower lobe non-small-cell lung cancer with shortness of breath, hypoxia, and tachycardia EXAM: CT ANGIOGRAPHY CHEST WITH CONTRAST TECHNIQUE: Multidetector CT imaging of the chest was performed using the standard protocol during bolus administration of intravenous contrast. Multiplanar CT image reconstructions and MIPs were obtained to evaluate the  vascular anatomy. RADIATION DOSE REDUCTION: This exam was performed according to the departmental dose-optimization program which includes automated exposure control, adjustment of the mA and/or kV according to patient size and/or use of iterative reconstruction technique. CONTRAST:  80mL OMNIPAQUE IOHEXOL 350 MG/ML SOLN COMPARISON:  CT chest dated 03/31/2023, nuclear medicine PET dated 04/29/2023 FINDINGS: Cardiovascular: Right chest wall port terminates in the lower SVC. The study is high quality for the evaluation of pulmonary embolism. Filling defect within right upper lobe anterior segmental artery. Great vessels are normal in course and caliber. Normal heart size. No significant pericardial fluid/thickening. Coronary artery calcifications and aortic atherosclerosis. Mediastinum/Nodes: Imaged thyroid gland without nodules meeting criteria for imaging follow-up by size. Normal esophagus. No substantial change in size of subcarinal 1.7 cm lymph node (5:85). New multi station lymphadenopathy, for example 1.3 cm AP window (5:64), 1.0 cm left hilar (5:84), and 1.1 cm right hilar (5:85). Lungs/Pleura: The central airways are patent. Lower lobe predominant peripheral ground-glass opacities and irregular consolidations throughout all lobes. New multifocal thick-walled cavitary lesions, for example central left upper lobe measuring 1.9 x 1.2 cm (13:57) no substantial change in size of dominant basilar left lower lobe irregular mass measuring 4.6 x 2.1 cm (remeasured, 13:116). Slightly decreased size of subpleural right lower lobe nodule measuring 2.2 x 1.3 cm (13:122), previously 2.4 x 1.5 cm. No pneumothorax. No pleural effusion. Upper abdomen: Unchanged enlarged spleen, 13.8 cm in AP dimension. Cholelithiasis. Musculoskeletal: No acute or abnormal lytic or blastic osseous lesions. Review of the MIP images confirms the above  findings. IMPRESSION: 1. Filling defect within right upper lobe anterior segmental artery,  consistent with pulmonary embolism. No evidence of right heart strain. 2. Lower lobe predominant peripheral ground-glass opacities and irregular consolidations throughout all lobes, with new multifocal thick-walled cavitary lesions. Findings are most consistent with multifocal pneumonia with suspicion for concurrent septic emboli. 3. No substantial change in size of dominant basilar left lower lobe irregular mass measuring 4.6 x 2.1 cm. Slightly decreased size of subpleural right lower lobe nodule measuring 2.2 x 1.3 cm. 4. New multi station lymphadenopathy, likely reactive. 5. Unchanged splenomegaly. 6. Aortic Atherosclerosis (ICD10-I70.0). Coronary artery calcifications. Assessment for potential risk factor modification, dietary therapy or pharmacologic therapy may be warranted, if clinically indicated. Critical Value/emergent results were called by telephone at the time of interpretation on 06/01/2023 at 2:06 pm to provider [facility] , who verbally acknowledged these results. Electronically Signed   By: Agustin Cree M.D.   On: 06/01/2023 14:13   DG Chest Port 1 View  Result Date: 06/01/2023 CLINICAL DATA:  Questionable sepsis.  Chest soreness EXAM: PORTABLE CHEST 1 VIEW COMPARISON:  X-ray 01/15/2011.  PET-CT 04/29/2023 FINDINGS: No pneumothorax or effusion. Normal cardiopericardial silhouette. No edema. Overlapping cardiac leads. Right IJ chest port with tip overlying the central SVC. The port is accessed multifocal consolidative opacities identified greatest in the medial right lung base also more peripherally in the right midlung and towards the lingula. Patient does have a history of lung cancer IMPRESSION: Chest port. Multifocal parenchymal opacities along the lower lungs. Possible acute infiltrate. Recommend follow-up. Please correlate with the history of known lung cancer in the prior PET CT of 04/29/2023. Recommend follow up Electronically Signed   By: Karen Kays M.D.   On: 06/01/2023 09:53     Pending Labs Unresulted Labs (From admission, onward)     Start     Ordered   06/02/23 0500  Comprehensive metabolic panel  Tomorrow morning,   R        06/01/23 1505   06/02/23 0500  CBC  Tomorrow morning,   R        06/01/23 1505   06/01/23 1503  Basic metabolic panel  Now then every 8 hours,   R      06/01/23 1503   06/01/23 0829  Blood Culture (routine x 2)  (Undifferentiated presentation (screening labs and basic nursing orders))  BLOOD CULTURE X 2,   STAT      06/01/23 0832            Vitals/Pain Today's Vitals   06/01/23 1435 06/01/23 1443 06/01/23 1500 06/01/23 1509  BP:   98/70   Pulse: 96  (!) 101   Resp: 20  16   Temp:      TempSrc:      SpO2: 98%  100%   Weight:      PainSc:  7   2     Isolation Precautions No active isolations  Medications Medications  azithromycin (ZITHROMAX) 500 mg in sodium chloride 0.9 % 250 mL IVPB (500 mg Intravenous New Bag/Given 06/01/23 1504)  azithromycin (ZITHROMAX) 500 mg in sodium chloride 0.9 % 250 mL IVPB (has no administration in time range)  cefTRIAXone (ROCEPHIN) 1 g in sodium chloride 0.9 % 100 mL IVPB (has no administration in time range)  0.9 %  sodium chloride infusion (has no administration in time range)  HYDROcodone-acetaminophen (NORCO) 10-325 MG per tablet 1 tablet (has no administration in time range)  nicotine (NICODERM CQ - dosed  in mg/24 hours) patch 14 mg (has no administration in time range)  pantoprazole (PROTONIX) EC tablet 40 mg (has no administration in time range)  tamsulosin (FLOMAX) capsule 0.4 mg (has no administration in time range)  arformoterol (BROVANA) nebulizer solution 15 mcg (has no administration in time range)    And  umeclidinium bromide (INCRUSE ELLIPTA) 62.5 MCG/ACT 1 puff (has no administration in time range)  acetaminophen (TYLENOL) tablet 650 mg (has no administration in time range)    Or  acetaminophen (TYLENOL) suppository 650 mg (has no administration in time range)   ondansetron (ZOFRAN) tablet 4 mg (has no administration in time range)    Or  ondansetron (ZOFRAN) injection 4 mg (has no administration in time range)  albuterol (PROVENTIL) (2.5 MG/3ML) 0.083% nebulizer solution 2.5 mg (has no administration in time range)  lactated ringers bolus 1,000 mL (0 mLs Intravenous Stopped 06/01/23 0947)  iohexol (OMNIPAQUE) 350 MG/ML injection 80 mL (80 mLs Intravenous Contrast Given 06/01/23 1214)  cefTRIAXone (ROCEPHIN) 1 g in sodium chloride 0.9 % 100 mL IVPB (0 g Intravenous Stopped 06/01/23 1502)  HYDROmorphone (DILAUDID) injection 0.5 mg (0.5 mg Intravenous Given 06/01/23 1444)    Mobility walks     Focused Assessments     R Recommendations: See Admitting Provider Note  Report given to:   Additional Notes:

## 2023-06-01 NOTE — ED Notes (Signed)
Pt requesting pain medication. Maralyn Sago, PA notified

## 2023-06-01 NOTE — ED Notes (Signed)
EDP at [facility] 

## 2023-06-01 NOTE — Consult Note (Signed)
NAME:  Matthew Rivera, MRN:  [id number], DOB:  09/28/1961, LOS: 0 ADMISSION DATE:  06/01/2023, CONSULTATION DATE:  06/01/23 REFERRING MD:  Maralyn Sago, PA, CHIEF COMPLAINT:  SOB   History of Present Illness:   43 yoM with PMH of tobacco abuse, emphysema, stage IV non-small cell lung cancer (diagnosed 03/2023) s/p first round of palliative chemotherapy w/ brain metastasis s/p craniotomy and resection 04/07/23 with SRS followed by Dr. Sofie Hartigan.  Oncology notes show patient poorly tolerated the first round of chemo with N/V/D, poor PO intake/ nutrition, 8lb weight loss, and ongoing fatigue with generalized weakness.  Presented to cancer center today with worsening right sided chest pain and SOB for 2 days found to be tachycardic and hypoxic 80% on RA, sent to ER for further evaluation.  Denies any recent fever, chills, productive cough, vomiting, hemoptysis, LE swelling, sick exposures, bleeding otherwise, or family hx of blood clots.  No reported dysphagia but wife states he complains about getting "down" foods and ongoing poor PO intake.  Reportedly quit smoking 3 weeks ago.  More sedentary since diagnosis, chemo and surgery per wife.   On ER workup, pt afebrile, SBP 90-111, requiring 2L [place] found workup to have RUL segmental PE without evidence of heart strain, lower lobe predominant peripheral GGO and irregular consolidations throughout with new multifocal thick walled cavitary lesions, stable LLL mass and slightly decreased right subpleural mass.  Lactic acid and trop hs normal. WBC 9.9, plts 141, INR 1.4, with relatively stable Na, sCr and hyperbilirubinemia compared to recent outpatient labs on 8/13.  Started on CAP coverage.  Given new PE and recent craniotomy, Pulmonary consulted for further anticoagulation recommendations.  To be admitted to [facility].   Pertinent  Medical History  Tobacco abuse, emphysema, stage IV NSCLC w/ brain metastasis s/p craniotomy and resection 04/07/23 and SRS, HLD, GERD, OA  Significant  Hospital Events: Including procedures, antibiotic start and stop dates in addition to other pertinent events     Interim History / Subjective:    Objective   Blood pressure 95/65, pulse 96, temperature 97.6 F (36.4 C), resp. rate 20, weight 63.6 kg, SpO2 98%.    FiO2 (%):  [100 %] 100 %   Intake/Output Summary (Last 24 hours) at 06/01/2023 1501 Last data filed at 06/01/2023 0947 Gross per 24 hour  Intake 1000 ml  Output --  Net 1000 ml   Filed Weights   06/01/23 0811  Weight: 63.6 kg   Examination: General:  chronically ill, thin and frail appearing older male sitting upright in bed in NAD.   HEENT: MM pink/moist, voice horse Neuro: HOH, awake, sleepy appearing after pain meds, answers questions appropriately, MAE CV: rr, ST, no murmur PULM:  non labored, mild tachypnea, diminished throughout, no wheeze, bibasilar rales Extremities: warm/dry, no obvious LE edema, tenderness, or erythema Skin: no rashes    Resolved Hospital Problem list    Assessment & Plan:   Acute RUL segmental pulmonary embolism in the setting of metastatic cancer - no evidence of right heart strain on CTA, normal troponin and lactic - PESI score > high risk P:  - given recent craniotomy and resection 04/07/23, would recommend further input from Neurosurgery on starting anticoagulation but likely start on heparin gtt, no bolus and monitor for neuro changes/ bleeding.  Stat CTH for any neuro changes - BP low for him but MAP still > 65, likely due to recent poor PO intake, hypovolemia, malnutrition - Not a candidate for systemic lytics if needed. -  no IR consult/ intervention required at this time - supplemental O2 for sat goal > 92% - LE dopplers ordered    Acute hypoxic respiratory failure CAP vs multifocal PNA vs ?aspiration with scattered cavitary lesions, unclear infectious vs new metastatic lesions - supplemental O2 prn - CAP coverage, further infectious workup per [facility] - consider dysphagia  workup if trouble swallowing.  - will need f/u imaging/CT in 6-8 weeks to ensure clearance  Emphysema  - started on stiolto by Dr. Celine Mans 7/30, new pt office eval Tobacco abuse - quit 3 weeks ago Metastatic NSCLC with brain mets- per oncology GERD   Remainder per [facility].  PCCM available as needed    Best Practice (right click and "Reselect all SmartList Selections" daily)  Per [facility]  Labs   CBC: Recent Labs  Lab 05/30/23 1356 06/01/23 0849  WBC 4.5 9.9  NEUTROABS 2.8 7.4  HGB 10.2* 9.6*  HCT 28.8* 28.4*  MCV 84.5 87.9  PLT 153 141*    Basic Metabolic Panel: Recent Labs  Lab 05/30/23 1356 06/01/23 0849  NA 127* 125*  K 4.0 3.8  CL 92* 92*  CO2 26 21*  GLUCOSE 131* 108*  BUN 44* 39*  CREATININE 1.28* 1.28*  CALCIUM 7.9* 7.4*   GFR: Estimated Creatinine Clearance: 54.5 mL/min (A) (by C-G formula based on SCr of 1.28 mg/dL (H)). Recent Labs  Lab 05/30/23 1356 06/01/23 0848 06/01/23 0849 06/01/23 0939  WBC 4.5  --  9.9  --   LATICACIDVEN  --  1.4  --  1.5    Liver Function Tests: Recent Labs  Lab 05/30/23 1356 06/01/23 0849  AST 28 30  ALT 28 31  ALKPHOS 243* 292*  BILITOT 1.5* 2.2*  PROT 6.0* 5.8*  ALBUMIN 2.6* 1.9*   No results for input(s): "LIPASE", "AMYLASE" in the last 168 hours. No results for input(s): "AMMONIA" in the last 168 hours.  ABG No results found for: "PHART", "PCO2ART", "PO2ART", "HCO3", "TCO2", "ACIDBASEDEF", "O2SAT"   Coagulation Profile: Recent Labs  Lab 06/01/23 0849  INR 1.4*    Cardiac Enzymes: No results for input(s): "CKTOTAL", "CKMB", "CKMBINDEX", "TROPONINI" in the last 168 hours.  HbA1C: Hgb A1c MFr Bld  Date/Time Value Ref Range Status  01/16/2011 06:12 AM  <5.7 % Final   5.1 (NOTE)                                                                       According to the ADA Clinical Practice Recommendations for 2011, when HbA1c is used as a screening test:   >=6.5%   Diagnostic of Diabetes Mellitus            (if abnormal result  is confirmed)  5.7-6.4%   Increased risk of developing Diabetes Mellitus  References:Diagnosis and Classification of Diabetes Mellitus,Diabetes Care,2011,34(Suppl 1):S62-S69 and Standards of Medical Care in         Diabetes - 2011,Diabetes Care,2011,34  (Suppl 1):S11-S61.    CBG: No results for input(s): "GLUCAP" in the last 168 hours.  Review of Systems:   Review of Systems  Constitutional:  Positive for malaise/fatigue and weight loss. Negative for chills and fever.  Respiratory:  Positive for cough and shortness of breath. Negative for hemoptysis and sputum production.  Cardiovascular:  Positive for chest pain. Negative for leg swelling.  Gastrointestinal:  Positive for blood in stool and melena. Negative for abdominal pain, nausea and vomiting.  Genitourinary:  Negative for hematuria.  Neurological:  Negative for focal weakness.   Past Medical History:  He,  has a past medical history of Dyslipidemia, GERD (gastroesophageal reflux disease), Lung nodule seen on imaging study (03/31/2023), Osteoarthritis, and Smoker.   Surgical History:   Past Surgical History:  Procedure Laterality Date   BACK SURGERY     x 1   COLONOSCOPY WITH PROPOFOL N/A 08/18/2020   Procedure: COLONOSCOPY WITH PROPOFOL;  Surgeon: Lanelle Bal, DO;  Location: [facility];  Service: Endoscopy;  Laterality: N/A;  7:30am   CRANIOTOMY Right 04/07/2023   Procedure: Right suboccipital craniotomy for tumor resection;  Surgeon: Tressie Stalker, MD;  Location: [facility] OR;  Service: Neurosurgery;  Laterality: Right;  TO follow   IR IMAGING GUIDED PORT INSERTION  05/12/2023   MULTIPLE TOOTH EXTRACTIONS     wears full dentures   POLYPECTOMY  08/18/2020   Procedure: POLYPECTOMY INTESTINAL;  Surgeon: Lanelle Bal, DO;  Location: [facility];  Service: Endoscopy;;     Social History:   reports that he has been smoking cigarettes. He started smoking about 52 years ago. He has a 78.3 pack-year  smoking history. He has never used smokeless tobacco. He reports that he does not currently use alcohol. He reports that he does not use drugs.   Family History:  His family history includes Asthma in his father; Diabetes in his father.   Allergies No Known Allergies   Home Medications  Prior to Admission medications   Medication Sig Start Date End Date Taking? Authorizing Provider  dexamethasone (DECADRON) 2 MG tablet Take 1 tablet twice a day for 1 week, followed by 1 tablet daily for 1 week, then stop 05/04/23   Heilingoetter, Cassandra L, PA-C  fluconazole (DIFLUCAN) 100 MG tablet Take 2 tablets today, then 1 tablet daily x 20 more days. Do not take any statin medications for cholesterol while on this. 05/06/23   Lonie Peak, MD  folic acid (FOLVITE) 1 MG tablet Take 1 tablet (1 mg total) by mouth daily. 05/04/23   Heilingoetter, Cassandra L, PA-C  HYDROcodone-acetaminophen (NORCO) 10-325 MG tablet Take 1 tablet by mouth every 4 (four) hours as needed for moderate pain.    [provider]  lidocaine-prilocaine (EMLA) cream Apply 1 Application topically as needed. 05/04/23   Heilingoetter, Cassandra L, PA-C  LORazepam (ATIVAN) 0.5 MG tablet Take 1 tablet (0.5 mg total) by mouth as needed for anxiety. Take 1-2 tablet by mouth 20 minutes before MRI or radiation treatment. 05/03/23   Erven Colla, PA-C  Naphazoline-Pheniramine (VISINE OP) Place 1 drop into both eyes daily as needed (dry eyes).    [provider]  nicotine (NICODERM CQ - DOSED IN MG/24 HOURS) 14 mg/24hr patch Place 1 patch (14 mg total) onto the skin daily. 05/10/23   Charlott Holler, MD  ondansetron (ZOFRAN-ODT) 4 MG disintegrating tablet Take 1 tablet (4 mg total) by mouth every 8 (eight) hours as needed for nausea or vomiting. 05/24/23   Heilingoetter, Cassandra L, PA-C  OVER THE COUNTER MEDICATION Take 1 tablet by mouth daily as needed (constipation). Stool softner    [provider]  pantoprazole  (PROTONIX) 40 MG tablet Take 1 tablet (40 mg total) by mouth 2 (two) times daily. 04/01/23   Rhetta Mura, MD  prochlorperazine (COMPAZINE) 10  MG tablet Take 1 tablet (10 mg total) by mouth every 6 (six) hours as needed. 05/04/23   Heilingoetter, Cassandra L, PA-C  tamsulosin (FLOMAX) 0.4 MG CAPS capsule Take 0.4 mg by mouth at bedtime as needed (urinary flow issues). 03/01/23   [provider]  Tiotropium Bromide-Olodaterol (STIOLTO RESPIMAT) 2.5-2.5 MCG/ACT AERS Inhale 2 puffs into the lungs daily. 05/10/23   Charlott Holler, MD  traMADol (ULTRAM) 50 MG tablet Take 50-100 mg by mouth 4 (four) times daily as needed for moderate pain. 07/11/20   [provider]  zolpidem (AMBIEN) 10 MG tablet Take 10 mg by mouth at bedtime as needed for sleep. 12/22/22   [provider]     Critical care time: n/a       Posey Boyer, MSN, NP, AG-ACNP-BC [facility] 06/01/2023, 4:25 PM  See Amion for pager If no response to pager , please call [phone] until 7pm After 7:00 pm call Elink  [phone]

## 2023-06-01 NOTE — Progress Notes (Signed)
Mohamed MD notified of pt transfer to ED.

## 2023-06-01 NOTE — ED Provider Notes (Signed)
[place] EMERGENCY DEPARTMENT AT [facility] Provider Note   CSN: [id number] Arrival date & time: 06/01/23  4098     History  Chief Complaint  Patient presents with   Shortness of Breath    Matthew Rivera is a 62 y.o. male.  Patient with history of lung cancer with brain mets status post craniotomy on 04/07/23 currently on chemotherapy presents today with complaints of shortness of breath. He states that since starting chemotherapy last week he has not been eating much and went to the infusion center to had IV fluids and was found to be hypoxic and tachycardic and subsequently sent here for evaluation. Upon arrival here he was satting in the 80s on room air and was started on oxygen at 3 L via [place]. He notes that he has been progressively more short of breath over the past few weeks. He is not on oxygen and does not have oxygen set up at home. He does note some chest pain at his port site that has been present since he had his first chemo infusion 1 week ago. Denies fevers or chills. No nausea, vomiting, diarrhea, abdominal pain, hematuria, or dysuria.   The history is provided by the patient. No language interpreter was used.  Shortness of Breath      Home Medications Prior to Admission medications   Medication Sig Start Date End Date Taking? Authorizing Provider  dexamethasone (DECADRON) 2 MG tablet Take 1 tablet twice a day for 1 week, followed by 1 tablet daily for 1 week, then stop 05/04/23   Heilingoetter, Cassandra L, PA-C  fluconazole (DIFLUCAN) 100 MG tablet Take 2 tablets today, then 1 tablet daily x 20 more days. Do not take any statin medications for cholesterol while on this. 05/06/23   Lonie Peak, MD  folic acid (FOLVITE) 1 MG tablet Take 1 tablet (1 mg total) by mouth daily. 05/04/23   Heilingoetter, Cassandra L, PA-C  HYDROcodone-acetaminophen (NORCO) 10-325 MG tablet Take 1 tablet by mouth every 4 (four) hours as needed for moderate pain.    [provider]  lidocaine-prilocaine (EMLA) cream Apply 1 Application topically as needed. 05/04/23   Heilingoetter, Cassandra L, PA-C  LORazepam (ATIVAN) 0.5 MG tablet Take 1 tablet (0.5 mg total) by mouth as needed for anxiety. Take 1-2 tablet by mouth 20 minutes before MRI or radiation treatment. 05/03/23   Erven Colla, PA-C  Naphazoline-Pheniramine (VISINE OP) Place 1 drop into both eyes daily as needed (dry eyes).    [provider]  nicotine (NICODERM CQ - DOSED IN MG/24 HOURS) 14 mg/24hr patch Place 1 patch (14 mg total) onto the skin daily. 05/10/23   Charlott Holler, MD  ondansetron (ZOFRAN-ODT) 4 MG disintegrating tablet Take 1 tablet (4 mg total) by mouth every 8 (eight) hours as needed for nausea or vomiting. 05/24/23   Heilingoetter, Cassandra L, PA-C  OVER THE COUNTER MEDICATION Take 1 tablet by mouth daily as needed (constipation). Stool softner    [provider]  pantoprazole (PROTONIX) 40 MG tablet Take 1 tablet (40 mg total) by mouth 2 (two) times daily. 04/01/23   Rhetta Mura, MD  prochlorperazine (COMPAZINE) 10 MG tablet Take 1 tablet (10 mg total) by mouth every 6 (six) hours as needed. 05/04/23   Heilingoetter, Cassandra L, PA-C  tamsulosin (FLOMAX) 0.4 MG CAPS capsule Take 0.4 mg by mouth at bedtime as needed (urinary flow issues). 03/01/23   [provider]  Tiotropium Bromide-Olodaterol (STIOLTO RESPIMAT) 2.5-2.5 MCG/ACT  AERS Inhale 2 puffs into the lungs daily. 05/10/23   Charlott Holler, MD  traMADol (ULTRAM) 50 MG tablet Take 50-100 mg by mouth 4 (four) times daily as needed for moderate pain. 07/11/20   [provider]  zolpidem (AMBIEN) 10 MG tablet Take 10 mg by mouth at bedtime as needed for sleep. 12/22/22   [provider]      Allergies    Patient has no known allergies.    Review of Systems   Review of Systems  Respiratory:  Positive for shortness of breath.   All other systems reviewed and are  negative.   Physical Exam Updated Vital Signs BP 93/76 (BP Location: Right Arm)   Pulse (!) 119   Temp (!) 97.5 F (36.4 C) (Oral)   Resp 18   Wt 63.6 kg   SpO2 97%   BMI 19.56 kg/m  Physical Exam Vitals and nursing note reviewed.  Constitutional:      General: He is not in acute distress.    Appearance: Normal appearance. He is normal weight. He is not ill-appearing, toxic-appearing or diaphoretic.     Comments: Ill appearing  HENT:     Head: Normocephalic and atraumatic.  Cardiovascular:     Rate and Rhythm: Regular rhythm. Tachycardia present.     Heart sounds: Normal heart sounds.  Pulmonary:     Effort: Pulmonary effort is normal. No respiratory distress.     Breath sounds: Normal breath sounds.     Comments: On 3L via [place] Chest:     Comments: Port site right chest non-infectious appearing Abdominal:     Palpations: Abdomen is soft.     Tenderness: There is no abdominal tenderness.  Musculoskeletal:        General: Normal range of motion.     Cervical back: Normal range of motion.     Right lower leg: No tenderness. No edema.     Left lower leg: No tenderness. No edema.  Skin:    General: Skin is warm and dry.  Neurological:     General: No focal deficit present.     Mental Status: He is alert.  Psychiatric:        Mood and Affect: Mood normal.        Behavior: Behavior normal.     ED Results / Procedures / Treatments   Labs (all labs ordered are listed, but only abnormal results are displayed) Labs Reviewed  CBC WITH DIFFERENTIAL/PLATELET - Abnormal; Notable for the following components:      Result Value   RBC 3.23 (*)    Hemoglobin 9.6 (*)    HCT 28.4 (*)    RDW 17.6 (*)    Platelets 141 (*)    nRBC 0.8 (*)    All other components within normal limits  PROTIME-INR - Abnormal; Notable for the following components:   Prothrombin Time 17.6 (*)    INR 1.4 (*)    All other components within normal limits  CULTURE, BLOOD (ROUTINE X 2)  CULTURE,  BLOOD (ROUTINE X 2)  APTT  LACTIC ACID, PLASMA  COMPREHENSIVE METABOLIC PANEL  URINALYSIS, W/ REFLEX TO CULTURE (INFECTION SUSPECTED)  I-STAT CG4 LACTIC ACID, ED  TROPONIN I (HIGH SENSITIVITY)    EKG None  Radiology DG Chest Port 1 View  Result Date: 06/01/2023 CLINICAL DATA:  Questionable sepsis.  Chest soreness EXAM: PORTABLE CHEST 1 VIEW COMPARISON:  X-ray 01/15/2011.  PET-CT 04/29/2023 FINDINGS: No pneumothorax or effusion. Normal cardiopericardial silhouette. No edema.  Overlapping cardiac leads. Right IJ chest port with tip overlying the central SVC. The port is accessed multifocal consolidative opacities identified greatest in the medial right lung base also more peripherally in the right midlung and towards the lingula. Patient does have a history of lung cancer IMPRESSION: Chest port. Multifocal parenchymal opacities along the lower lungs. Possible acute infiltrate. Recommend follow-up. Please correlate with the history of known lung cancer in the prior PET CT of 04/29/2023. Recommend follow up Electronically Signed   By: Karen Kays M.D.   On: 06/01/2023 09:53    Procedures .Critical Care  Performed by: Silva Bandy, PA-C Authorized by: Silva Bandy, PA-C   Critical care provider statement:    Critical care time (minutes):  30   Critical care start time:  06/01/2023 12:00 PM   Critical care end time:  06/01/2023 12:30 PM   Critical care was necessary to treat or prevent imminent or life-threatening deterioration of the following conditions:  Respiratory failure, dehydration and circulatory failure   Critical care was time spent personally by me on the following activities:  Development of treatment plan with patient or surrogate, discussions with primary provider, examination of patient, evaluation of patient's response to treatment, obtaining history from patient or surrogate, ordering and review of laboratory studies, ordering and review of radiographic studies, pulse  oximetry, re-evaluation of patient's condition and review of old charts   Care discussed with: admitting provider       Medications Ordered in ED Medications  cefTRIAXone (ROCEPHIN) 1 g in sodium chloride 0.9 % 100 mL IVPB (has no administration in time range)  azithromycin (ZITHROMAX) 500 mg in sodium chloride 0.9 % 250 mL IVPB (has no administration in time range)  lactated ringers bolus 1,000 mL (0 mLs Intravenous Stopped 06/01/23 0947)  iohexol (OMNIPAQUE) 350 MG/ML injection 80 mL (80 mLs Intravenous Contrast Given 06/01/23 1214)    ED Course/ Medical Decision Making/ A&P                                 Medical Decision Making Amount and/or Complexity of Data Reviewed Labs: ordered. Radiology: ordered. ECG/medicine tests: ordered.  Risk Prescription drug management. Decision regarding hospitalization.   This patient is a 62 y.o. male who presents to the ED for concern of shortness of breath, this involves an extensive number of treatment options, and is a complaint that carries with it a high risk of complications and morbidity. The emergent differential diagnosis prior to evaluation includes, but is not limited to,  CHF, pericardial effusion/tamponade, arrhythmias, ACS, COPD, asthma, bronchitis, pneumonia, pneumothorax, PE, anemia   This is not an exhaustive differential.   Past Medical History / Co-morbidities / Social History: history of non-small cell lung cancer with brain mets status post craniotomy on 04/07/23 with Dr. Jake Samples currently on chemotherapy   Additional history: Chart reviewed. Pertinent results include: had first chemotherapy infusion last week, has been getting IV infusions of fluids due to dehydration following this  Physical Exam: Physical exam performed. The pertinent findings include: requiring oxygen via nasal cannula, chronically ill appearing  Lab Tests: I ordered, and personally interpreted labs.  The pertinent results include:  WBC 9.9, hgb 9.6  down from 10.2 2 days ago. UA noninfectious, covid negative, lactic WNL, Na 125, creatinine 1.28, alk phos 292, t bili 2.2 thought by oncology to be dehydration.    Imaging Studies: I ordered imaging studies including CXR, CTA  PE. I independently visualized and interpreted imaging which showed   CXR: Multifocal parenchymal opacities along the lower lungs. Possible acute infiltrate. Recommend follow-up. Please correlate with the history of known lung cancer in the prior PET CT of 04/29/2023. Recommend follow up  CT: 1. Filling defect within right upper lobe anterior segmental artery, consistent with pulmonary embolism. No evidence of right heart strain. 2. Lower lobe predominant peripheral ground-glass opacities and irregular consolidations throughout all lobes, with new multifocal thick-walled cavitary lesions. Findings are most consistent with multifocal pneumonia with suspicion for concurrent septic emboli. 3. No substantial change in size of dominant basilar left lower lobe irregular mass measuring 4.6 x 2.1 cm. Slightly decreased size of subpleural right lower lobe nodule measuring 2.2 x 1.3 cm. 4. New multi station lymphadenopathy, likely reactive. 5. Unchanged splenomegaly. 6. Aortic Atherosclerosis  I agree with the radiologist interpretation.   Cardiac Monitoring:  The patient was maintained on a cardiac monitor.  My attending physician Dr. Jeraldine Loots viewed and interpreted the cardiac monitored which showed an underlying rhythm of: sinus tachycardia. I agree with this interpretation.   Medications: I ordered medication including fluids, heparin, rocephin, azithromycin  for dehydration, CAP, PE. Reevaluation of the patient after these medicines showed that the patient improved. I have reviewed the patients home medicines and have made adjustments as needed.   Disposition:  Patient with PE with multifocal pneumonia and concern for septic emboli on CT imaging. He is not normally on  oxygen and is currently requiring 3 L O2 via nasal cannula. Will require admission for management of same. Discussed same with patient who is understanding and in agreement with this. Additionally, given that he recently had a craniotomy on 6/27, will discuss with pulmonology before proceeding with heparin.   Discussed patient with critical care, they will see the patient in consultation. Recommend consulting neurosurgery about heparin.  Discussed patient with hospitalist Dr. Erenest Blank who agrees to admit.   This is a shared visit with supervising physician Dr. Jeraldine Loots who has independently evaluated patient & provided guidance in evaluation/management/disposition, in agreement with care    Final Clinical Impression(s) / ED Diagnoses Final diagnoses:  Single subsegmental pulmonary embolism without acute cor pulmonale (HCC)  Multifocal pneumonia  Acute hypoxic respiratory failure [facility])    Rx / DC Orders ED Discharge Orders     None         Vear Clock 06/02/23 1610    Gerhard Munch, MD 06/04/23 1021

## 2023-06-01 NOTE — Telephone Encounter (Signed)
Patient currently hospitalized for acute PE. Please go ahead and schedule for follow-up with Dr. Celine Mans or APP in 4-6 weeks.

## 2023-06-01 NOTE — Progress Notes (Addendum)
ANTICOAGULATION CONSULT NOTE - Initial Consult  Pharmacy Consult for heparin Indication: pulmonary embolus  No Known Allergies  Patient Measurements: Weight: 63.6 kg (140 lb 3.4 oz) Heparin Dosing Weight: 63.6 kg  Vital Signs: Temp: 97.6 F (36.4 C) (08/21 1202) Temp Source: Oral (08/21 0816) BP: 100/70 (08/21 1300) Pulse Rate: 101 (08/21 1300)  Labs: Recent Labs    05/30/23 1356 06/01/23 0849  HGB 10.2* 9.6*  HCT 28.8* 28.4*  PLT 153 141*  APTT  --  34  LABPROT  --  17.6*  INR  --  1.4*  CREATININE 1.28* 1.28*  TROPONINIHS  --  10    Estimated Creatinine Clearance: 54.5 mL/min (A) (by C-G formula based on SCr of 1.28 mg/dL (H)).   Medical History: Past Medical History:  Diagnosis Date   Dyslipidemia    GERD (gastroesophageal reflux disease)    Lung nodule seen on imaging study 03/31/2023   seen on CT results   Osteoarthritis    Smoker    long term smoker 1 ppd     Assessment: 62 year old male presented to the cancer center for IVF, having problems with worsening shortness of breath. He was placed on O2 and sent to the ED for further evaluation. He has recent diagnosis of lung cancer metastatic to brain s/p craniotomy in June and started on chemotherapy. CTA positive "filling defect within right upper lobe anterior segmental artery, consistent with pulmonary embolism. No evidence of right heart strain." Also positive for bilateral DVT. No prior anticoagulation noted. Given history of craniotomy, Neurosurgery asked to weigh in regarding safety of anticoagulation - okay to heparinize at normal dosing given two months post-op.  Baseline CBC with slight drop in Hgb; plt stable. Patient with first chemotherapy 8/5.   Goal of Therapy:  Heparin level 0.3-0.7 units/ml Monitor platelets by anticoagulation protocol: Yes   Plan:  -Heparin 3800 unit bolus -Heparin infusion 1050 units/hr -Check 6 hour heparin level -Daily CBC, heparin level while on heparin; monitor  closely for signs/symptoms of bleeding  ADDENDUM 1700: Bolus discontinued by CCMD. Per notes, proceed with anticoagulation with no bolus. Will defer any further bolus doses.  Pricilla Riffle, PharmD, BCPS Clinical Pharmacist 06/01/2023 4:21 PM

## 2023-06-02 DIAGNOSIS — I2699 Other pulmonary embolism without acute cor pulmonale: Secondary | ICD-10-CM | POA: Diagnosis not present

## 2023-06-02 LAB — HEPARIN LEVEL (UNFRACTIONATED)
Heparin Unfractionated: 0.1 [IU]/mL — ABNORMAL LOW (ref 0.30–0.70)
Heparin Unfractionated: 0.17 [IU]/mL — ABNORMAL LOW (ref 0.30–0.70)

## 2023-06-02 LAB — COMPREHENSIVE METABOLIC PANEL
ALT: 26 U/L (ref 0–44)
AST: 31 U/L (ref 15–41)
Albumin: 1.6 g/dL — ABNORMAL LOW (ref 3.5–5.0)
Alkaline Phosphatase: 268 U/L — ABNORMAL HIGH (ref 38–126)
Anion gap: 7 (ref 5–15)
BUN: 27 mg/dL — ABNORMAL HIGH (ref 8–23)
CO2: 24 mmol/L (ref 22–32)
Calcium: 7.1 mg/dL — ABNORMAL LOW (ref 8.9–10.3)
Chloride: 98 mmol/L (ref 98–111)
Creatinine, Ser: 0.95 mg/dL (ref 0.61–1.24)
GFR, Estimated: >60 mL/min (ref >60)
Glucose, Bld: 107 mg/dL — ABNORMAL HIGH (ref 70–99)
Potassium: 3.7 mmol/L (ref 3.5–5.1)
Sodium: 129 mmol/L — ABNORMAL LOW (ref 135–145)
Total Bilirubin: 1.2 mg/dL (ref 0.3–1.2)
Total Protein: 5 g/dL — ABNORMAL LOW (ref 6.5–8.1)

## 2023-06-02 LAB — BASIC METABOLIC PANEL
Anion gap: 7 (ref 5–15)
BUN: 33 mg/dL — ABNORMAL HIGH (ref 8–23)
CO2: 24 mmol/L (ref 22–32)
Calcium: 7 mg/dL — ABNORMAL LOW (ref 8.9–10.3)
Chloride: 96 mmol/L — ABNORMAL LOW (ref 98–111)
Creatinine, Ser: 1.01 mg/dL (ref 0.61–1.24)
GFR, Estimated: >60 mL/min (ref >60)
Glucose, Bld: 110 mg/dL — ABNORMAL HIGH (ref 70–99)
Potassium: 3.7 mmol/L (ref 3.5–5.1)
Sodium: 127 mmol/L — ABNORMAL LOW (ref 135–145)

## 2023-06-02 LAB — CBC
HCT: 25.4 % — ABNORMAL LOW (ref 39.0–52.0)
Hemoglobin: 8.5 g/dL — ABNORMAL LOW (ref 13.0–17.0)
MCH: 30.1 pg (ref 26.0–34.0)
MCHC: 33.5 g/dL (ref 30.0–36.0)
MCV: 90.1 fL (ref 80.0–100.0)
Platelets: 141 10*3/uL — ABNORMAL LOW (ref 150–400)
RBC: 2.82 MIL/uL — ABNORMAL LOW (ref 4.22–5.81)
RDW: 18.1 % — ABNORMAL HIGH (ref 11.5–15.5)
WBC: 14.4 10*3/uL — ABNORMAL HIGH (ref 4.0–10.5)
nRBC: 0.7 % — ABNORMAL HIGH (ref 0.0–0.2)

## 2023-06-02 MED ORDER — AZITHROMYCIN 250 MG PO TABS
500.0000 mg | ORAL_TABLET | Freq: Every day | ORAL | Status: DC
  Administered 2023-06-02 – 2023-06-04 (×3): 500 mg via ORAL
  Filled 2023-06-02 (×3): qty 2

## 2023-06-02 MED ORDER — OXYCODONE HCL 5 MG PO TABS
5.0000 mg | ORAL_TABLET | ORAL | Status: DC | PRN
  Administered 2023-06-02 – 2023-06-03 (×3): 5 mg via ORAL
  Filled 2023-06-02 (×3): qty 1

## 2023-06-02 MED ORDER — HEPARIN (PORCINE) 25000 UT/250ML-% IV SOLN: 1450.0000 [IU]/h | INTRAVENOUS | Status: DC

## 2023-06-02 MED ORDER — APIXABAN 5 MG PO TABS: 5.0000 mg | ORAL_TABLET | Freq: Two times a day (BID) | ORAL | Status: DC

## 2023-06-02 MED ORDER — METHOCARBAMOL 500 MG PO TABS
500.0000 mg | ORAL_TABLET | Freq: Four times a day (QID) | ORAL | Status: DC | PRN
  Administered 2023-06-03: 500 mg via ORAL
  Filled 2023-06-02: qty 1

## 2023-06-02 MED ORDER — APIXABAN 5 MG PO TABS
10.0000 mg | ORAL_TABLET | Freq: Two times a day (BID) | ORAL | Status: DC
  Administered 2023-06-02 – 2023-06-04 (×5): 10 mg via ORAL
  Filled 2023-06-02 (×5): qty 2

## 2023-06-02 NOTE — Discharge Instructions (Addendum)
Information on my medicine - ELIQUIS (apixaban)  This medication education was reviewed with me or my healthcare representative as part of my discharge preparation.   Why was Eliquis prescribed for you? Eliquis was prescribed to treat blood clots that may have been found in the veins of your legs (deep vein thrombosis) or in your lungs (pulmonary embolism) and to reduce the risk of them occurring again.  What do You need to know about Eliquis ? The starting dose is 10 mg (two 5 mg tablets) taken TWICE daily for the FIRST SEVEN (7) DAYS, then on 06/09/2023  the dose is reduced to ONE 5 mg tablet taken TWICE daily.  Eliquis may be taken with or without food.   Try to take the dose about the same time in the morning and in the evening. If you have difficulty swallowing the tablet whole please discuss with your pharmacist how to take the medication safely.  Take Eliquis exactly as prescribed and DO NOT stop taking Eliquis without talking to the doctor who prescribed the medication.  Stopping may increase your risk of developing a new blood clot.  Refill your prescription before you run out.  After discharge, you should have regular check-up appointments with your healthcare provider that is prescribing your Eliquis.    What do you do if you miss a dose? If a dose of ELIQUIS is not taken at the scheduled time, take it as soon as possible on the same day and twice-daily administration should be resumed. The dose should not be doubled to make up for a missed dose.  Important Safety Information A possible side effect of Eliquis is bleeding. You should call your healthcare provider right away if you experience any of the following: Bleeding from an injury or your nose that does not stop. Unusual colored urine (red or dark brown) or unusual colored stools (red or black). Unusual bruising for unknown reasons. A serious fall or if you hit your head (even if there is no bleeding).  Some  medicines may interact with Eliquis and might increase your risk of bleeding or clotting while on Eliquis. To help avoid this, consult your healthcare provider or pharmacist prior to using any new prescription or non-prescription medications, including herbals, vitamins, non-steroidal anti-inflammatory drugs (NSAIDs) and supplements.  This website has more information on Eliquis (apixaban): http://[email or web address].com/eliquis/home

## 2023-06-02 NOTE — Plan of Care (Signed)
°  Problem: Clinical Measurements: Goal: Ability to maintain clinical measurements within normal limits will improve Outcome: Progressing   Problem: Nutrition: Goal: Adequate nutrition will be maintained Outcome: Progressing   Problem: Pain Managment: Goal: General experience of comfort will improve Outcome: Progressing   Problem: Skin Integrity: Goal: Risk for impaired skin integrity will decrease Outcome: Progressing   

## 2023-06-02 NOTE — Progress Notes (Signed)
PROGRESS NOTE    LUX CULLINAN  RUE:[id number] DOB: 1961-10-11 DOA: 06/01/2023 PCP: [name], MD    Brief Narrative:  62 year old pulmonary smoker, GERD, non-small cell lung cancer with metastatic disease, brain mets status postcraniotomy 6/30 as well as intracranial radiation currently undergoing chemotherapy presented with shortness of breath, cough.  Found to have pulmonary embolism, bilateral lower leg DVT.  On 2 L oxygen in the ER.   Assessment & Plan:   Acute pulmonary embolism without cor pulmonale, bilateral lower extremity extensive DVT: Thromboembolism due to metastatic non-small cell lung cancer. Hypoxemia -No right heart strain on CT scan.  Recent craniectomy within 3 months.  Case discussed with neurosurgery and currently remains on heparin infusion. -Called and discussed case with oncology, will start patient on Eliquis and monitor today. -Given oxygen to keep saturation more than 90%, incentive spirometry and breathing treatments.  Evaluate for home oxygen. -Adequate pain management.  Community-acquired pneumonia: Suspected.  Patient with multiple findings on his CT scan of the chest which could be related to postsurgical changes.  Empiric antibiotics with Rocephin and azithromycin.  Metastatic lung adenocarcinoma: Followed by oncology.  On chemotherapy.  Hyponatremia: Suspect SIADH.  On maintenance fluid.  Fairly stable. GERD: On Protonix.  Goal of care: Poor prognosis.  Remains full code.  Oncology following. Needs more pain management today, will try oral oxycodone and muscle relaxants.   DVT prophylaxis: Heparin infusion   Code Status: Full code Family Communication: None at the bedside Disposition Plan: Status is: Inpatient Remains inpatient appropriate because: On heparin infusion     Consultants:  Oncology Pulmonary critical care  Procedures:  None  Antimicrobials:  Rocephin azithromycin 8/21--   Subjective: Patient seen and examined.   Continues to have cough with occasional sputum production.  He does complain of dull chest pain mostly on the right side of the chest.  No breathing issues at rest except coughing.  He has not mobilized yet.  Denies any pain in his legs.  Objective: Vitals:   06/02/23 0739 06/02/23 0800 06/02/23 0830 06/02/23 0900  BP: 90/69 (!) 89/67  102/64  Pulse: (!) 103 97 (!) 111 91  Resp: 18 18 15 16   Temp:  97.8 F (36.6 C)    TempSrc:  Oral    SpO2: 99% 100% 98% 98%  Weight:      Height:        Intake/Output Summary (Last 24 hours) at 06/02/2023 0929 Last data filed at 06/02/2023 0848 Gross per 24 hour  Intake 2572.1 ml  Output 275 ml  Net 2297.1 ml   Filed Weights   06/01/23 0811 06/01/23 1622  Weight: 63.6 kg 64 kg    Examination:  General: Frail.  Sick looking.  On 2 L oxygen.  Alert awake and oriented.  Anxious. Cardiovascular: S1-S2 normal.  Regular rate rhythm. Respiratory: Bilateral conducted upper airway sounds.  Poor inspiratory effort. Port-A-Cath present on the right chest wall. Gastrointestinal: Soft.  Nontender.  Bowel sound present. Ext: Mild tenderness left calf.  No edema or swelling. Neuro: Intact.  Gross generalized weakness. Musculoskeletal: No deformities.     Data Reviewed: I have personally reviewed following labs and imaging studies  CBC: Recent Labs  Lab 05/30/23 1356 06/01/23 0849 06/02/23 0732  WBC 4.5 9.9 14.4*  NEUTROABS 2.8 7.4  --   HGB 10.2* 9.6* 8.5*  HCT 28.8* 28.4* 25.4*  MCV 84.5 87.9 90.1  PLT 153 141* 141*   Basic Metabolic Panel: Recent Labs  Lab 05/30/23 1356  06/01/23 0849 06/02/23 0010 06/02/23 0732  NA 127* 125* 127* 129*  K 4.0 3.8 3.7 3.7  CL 92* 92* 96* 98  CO2 26 21* 24 24  GLUCOSE 131* 108* 110* 107*  BUN 44* 39* 33* 27*  CREATININE 1.28* 1.28* 1.01 0.95  CALCIUM 7.9* 7.4* 7.0* 7.1*   GFR: Estimated Creatinine Clearance: 73.9 mL/min (by C-G formula based on SCr of 0.95 mg/dL). Liver Function Tests: Recent  Labs  Lab 05/30/23 1356 06/01/23 0849 06/02/23 0732  AST 28 30 31   ALT 28 31 26   ALKPHOS 243* 292* 268*  BILITOT 1.5* 2.2* 1.2  PROT 6.0* 5.8* 5.0*  ALBUMIN 2.6* 1.9* 1.6*   No results for input(s): "LIPASE", "AMYLASE" in the last 168 hours. No results for input(s): "AMMONIA" in the last 168 hours. Coagulation Profile: Recent Labs  Lab 06/01/23 0849  INR 1.4*   Cardiac Enzymes: No results for input(s): "CKTOTAL", "CKMB", "CKMBINDEX", "TROPONINI" in the last 168 hours. BNP (last 3 results) No results for input(s): "PROBNP" in the last 8760 hours. HbA1C: No results for input(s): "HGBA1C" in the last 72 hours. CBG: No results for input(s): "GLUCAP" in the last 168 hours. Lipid Profile: No results for input(s): "CHOL", "HDL", "LDLCALC", "TRIG", "CHOLHDL", "LDLDIRECT" in the last 72 hours. Thyroid Function Tests: No results for input(s): "TSH", "T4TOTAL", "FREET4", "T3FREE", "THYROIDAB" in the last 72 hours. Anemia Panel: No results for input(s): "VITAMINB12", "FOLATE", "FERRITIN", "TIBC", "IRON", "RETICCTPCT" in the last 72 hours. Sepsis Labs: Recent Labs  Lab 06/01/23 0848 06/01/23 0939  LATICACIDVEN 1.4 1.5    Recent Results (from the past 240 hour(s))  Blood Culture (routine x 2)     Status: None (Preliminary result)   Collection Time: 06/01/23  9:12 AM   Specimen: BLOOD  Result Value Ref Range Status   Specimen Description   Final    BLOOD LEFT ANTECUBITAL Performed at [facility], [address], [place], [place] [address]    Special Requests   Final    BOTTLES DRAWN AEROBIC AND ANAEROBIC Blood Culture adequate volume Performed at [facility], [address] [place], [place] [address]    Culture   Final    NO GROWTH < 24 HOURS Performed at [facility], [address] [place], [place] [address]    Report Status PENDING  Incomplete  Blood Culture (routine x 2)     Status: None (Preliminary result)    Collection Time: 06/01/23  9:15 AM   Specimen: BLOOD  Result Value Ref Range Status   Specimen Description   Final    BLOOD PORTA CATH LEFT Performed at [facility], [address] [place], [place] [address]    Special Requests   Final    BOTTLES DRAWN AEROBIC AND ANAEROBIC Blood Culture adequate volume Performed at [facility], [address], [place], [place] [address]    Culture   Final    NO GROWTH < 24 HOURS Performed at [facility], [address], [place], [place] [address]    Report Status PENDING  Incomplete  Resp panel by RT-PCR (RSV, Flu A&B, Covid) Anterior Nasal Swab     Status: None   Collection Time: 06/01/23 10:37 AM   Specimen: Anterior Nasal Swab  Result Value Ref Range Status   SARS Coronavirus 2 by RT PCR NEGATIVE NEGATIVE Final    Comment: (NOTE) SARS-CoV-2 target nucleic acids are NOT DETECTED.  The SARS-CoV-2 RNA is generally detectable  in upper respiratory specimens during the acute phase of infection. The lowest concentration of SARS-CoV-2 viral copies this assay can detect is 138 copies/mL. A negative result does not preclude SARS-Cov-2 infection and should not be used as the sole basis for treatment or other patient management decisions. A negative result may occur with  improper specimen collection/handling, submission of specimen other than nasopharyngeal swab, presence of viral mutation(s) within the areas targeted by this assay, and inadequate number of viral copies(<138 copies/mL). A negative result must be combined with clinical observations, patient history, and epidemiological information. The expected result is Negative.  Fact Sheet for Patients:  BloggerCourse.com  Fact Sheet for Healthcare Providers:  SeriousBroker.it  This test is no t yet approved or cleared by the [place] FDA and  has been authorized for detection and/or diagnosis  of SARS-CoV-2 by FDA under an Emergency Use Authorization (EUA). This EUA will remain  in effect (meaning this test can be used) for the duration of the COVID-19 declaration under Section 564(b)(1) of the Act, 21 U.S.C.section 360bbb-3(b)(1), unless the authorization is terminated  or revoked sooner.       Influenza A by PCR NEGATIVE NEGATIVE Final   Influenza B by PCR NEGATIVE NEGATIVE Final    Comment: (NOTE) The Xpert Xpress SARS-CoV-2/FLU/RSV plus assay is intended as an aid in the diagnosis of influenza from Nasopharyngeal swab specimens and should not be used as a sole basis for treatment. Nasal washings and aspirates are unacceptable for Xpert Xpress SARS-CoV-2/FLU/RSV testing.  Fact Sheet for Patients: BloggerCourse.com  Fact Sheet for Healthcare Providers: SeriousBroker.it  This test is not yet approved or cleared by the [place] FDA and has been authorized for detection and/or diagnosis of SARS-CoV-2 by FDA under an Emergency Use Authorization (EUA). This EUA will remain in effect (meaning this test can be used) for the duration of the COVID-19 declaration under Section 564(b)(1) of the Act, 21 U.S.C. section 360bbb-3(b)(1), unless the authorization is terminated or revoked.     Resp Syncytial Virus by PCR NEGATIVE NEGATIVE Final    Comment: (NOTE) Fact Sheet for Patients: BloggerCourse.com  Fact Sheet for Healthcare Providers: SeriousBroker.it  This test is not yet approved or cleared by the [place] FDA and has been authorized for detection and/or diagnosis of SARS-CoV-2 by FDA under an Emergency Use Authorization (EUA). This EUA will remain in effect (meaning this test can be used) for the duration of the COVID-19 declaration under Section 564(b)(1) of the Act, 21 U.S.C. section 360bbb-3(b)(1), unless the authorization is terminated  or revoked.  Performed at [facility], [address], [place], [place] [address]   MRSA Next Gen by PCR, Nasal     Status: None   Collection Time: 06/01/23  4:39 PM   Specimen: Nasal Mucosa; Nasal Swab  Result Value Ref Range Status   MRSA by PCR Next Gen NOT DETECTED NOT DETECTED Final    Comment: (NOTE) The GeneXpert MRSA Assay (FDA approved for NASAL specimens only), is one component of a comprehensive MRSA colonization surveillance program. It is not intended to diagnose MRSA infection nor to guide or monitor treatment for MRSA infections. Test performance is not FDA approved in patients less than 62 years old. Performed at [facility], [address] [place], [place] [address]          Radiology Studies: VAS [place] LOWER EXTREMITY VENOUS (DVT)  Result Date: 06/01/2023  Lower Venous DVT Study Patient Name:  JEFREY MADDALONI  Date of Exam:   06/01/2023 Medical Rec #: [id number]     Accession #:    [id number] Date of Birth: [birth year]      Patient Gender: M Patient Age:   73 years Exam Location:  [facility] Procedure:      VAS [place] LOWER EXTREMITY VENOUS (DVT) Referring Phys: [facility] --------------------------------------------------------------------------------  Indications: Pulmonary embolism.  Risk Factors: Confirmed PE Cancer. Comparison Study: No prior studies. Performing Technologist: Chanda Busing RVT  Examination Guidelines: A complete evaluation includes B-mode imaging, spectral Doppler, color Doppler, and power Doppler as needed of all accessible portions of each vessel. Bilateral testing is considered an integral part of a complete examination. Limited examinations for reoccurring indications may be performed as noted. The reflux portion of the exam is performed with the patient in reverse Trendelenburg.  +-----------------+---------------+---------+-----------+----------+----------+ RIGHT             CompressibilityPhasicitySpontaneityPropertiesThrombus                                                                 Aging      +-----------------+---------------+---------+-----------+----------+----------+ CFV              Full           Yes      Yes                             +-----------------+---------------+---------+-----------+----------+----------+ SFJ              Full                                                    +-----------------+---------------+---------+-----------+----------+----------+ FV Prox          Full                                                    +-----------------+---------------+---------+-----------+----------+----------+ FV Mid           Full                                                    +-----------------+---------------+---------+-----------+----------+----------+ FV Distal        Full                                                    +-----------------+---------------+---------+-----------+----------+----------+ PFV              Full                                                    +-----------------+---------------+---------+-----------+----------+----------+  POP              None           No       No                   Acute      +-----------------+---------------+---------+-----------+----------+----------+ PTV              Full                                                    +-----------------+---------------+---------+-----------+----------+----------+ PERO             Full                                                    +-----------------+---------------+---------+-----------+----------+----------+ Gastroc          Partial                                      Acute      +-----------------+---------------+---------+-----------+----------+----------+ Tibioperoneal    Partial                                      Acute      trunk                                                                     +-----------------+---------------+---------+-----------+----------+----------+   +---------+---------------+---------+-----------+----------+--------------+ LEFT     CompressibilityPhasicitySpontaneityPropertiesThrombus Aging +---------+---------------+---------+-----------+----------+--------------+ CFV      Full           Yes      Yes                                 +---------+---------------+---------+-----------+----------+--------------+ SFJ      Full                                                        +---------+---------------+---------+-----------+----------+--------------+ FV Prox  Full                                                        +---------+---------------+---------+-----------+----------+--------------+ FV Mid   Full                                                        +---------+---------------+---------+-----------+----------+--------------+  FV DistalFull                                                        +---------+---------------+---------+-----------+----------+--------------+ PFV      Full                                                        +---------+---------------+---------+-----------+----------+--------------+ POP      None           No       No                   Acute          +---------+---------------+---------+-----------+----------+--------------+ PTV      Full                                                        +---------+---------------+---------+-----------+----------+--------------+ PERO     Full                                                        +---------+---------------+---------+-----------+----------+--------------+ Soleal   None                                         Acute          +---------+---------------+---------+-----------+----------+--------------+ Gastroc  None                                         Acute           +---------+---------------+---------+-----------+----------+--------------+     Summary: RIGHT: - Findings consistent with acute deep vein thrombosis involving the right popliteal vein, tibioperoneal trunk, and right gastrocnemius veins. - No cystic structure found in the popliteal fossa.  LEFT: - Findings consistent with acute deep vein thrombosis involving the left popliteal vein, left soleal veins, and left gastrocnemius veins. - No cystic structure found in the popliteal fossa.  *See table(s) above for measurements and observations. Electronically signed by Heath Lark on 06/01/2023 at 8:50:30 PM.    Final    CT Angio Chest PE W and/or Wo Contrast  Result Date: 06/01/2023 CLINICAL DATA:  History of left lower lobe non-small-cell lung cancer with shortness of breath, hypoxia, and tachycardia EXAM: CT ANGIOGRAPHY CHEST WITH CONTRAST TECHNIQUE: Multidetector CT imaging of the chest was performed using the standard protocol during bolus administration of intravenous contrast. Multiplanar CT image reconstructions and MIPs were obtained to evaluate the vascular anatomy. RADIATION DOSE REDUCTION: This exam was performed according to the departmental dose-optimization program which includes automated exposure control, adjustment of the mA and/or kV according to patient size and/or use of iterative  reconstruction technique. CONTRAST:  80mL OMNIPAQUE IOHEXOL 350 MG/ML SOLN COMPARISON:  CT chest dated 03/31/2023, nuclear medicine PET dated 04/29/2023 FINDINGS: Cardiovascular: Right chest wall port terminates in the lower SVC. The study is high quality for the evaluation of pulmonary embolism. Filling defect within right upper lobe anterior segmental artery. Great vessels are normal in course and caliber. Normal heart size. No significant pericardial fluid/thickening. Coronary artery calcifications and aortic atherosclerosis. Mediastinum/Nodes: Imaged thyroid gland without nodules meeting criteria for imaging follow-up  by size. Normal esophagus. No substantial change in size of subcarinal 1.7 cm lymph node (5:85). New multi station lymphadenopathy, for example 1.3 cm AP window (5:64), 1.0 cm left hilar (5:84), and 1.1 cm right hilar (5:85). Lungs/Pleura: The central airways are patent. Lower lobe predominant peripheral ground-glass opacities and irregular consolidations throughout all lobes. New multifocal thick-walled cavitary lesions, for example central left upper lobe measuring 1.9 x 1.2 cm (13:57) no substantial change in size of dominant basilar left lower lobe irregular mass measuring 4.6 x 2.1 cm (remeasured, 13:116). Slightly decreased size of subpleural right lower lobe nodule measuring 2.2 x 1.3 cm (13:122), previously 2.4 x 1.5 cm. No pneumothorax. No pleural effusion. Upper abdomen: Unchanged enlarged spleen, 13.8 cm in AP dimension. Cholelithiasis. Musculoskeletal: No acute or abnormal lytic or blastic osseous lesions. Review of the MIP images confirms the above findings. IMPRESSION: 1. Filling defect within right upper lobe anterior segmental artery, consistent with pulmonary embolism. No evidence of right heart strain. 2. Lower lobe predominant peripheral ground-glass opacities and irregular consolidations throughout all lobes, with new multifocal thick-walled cavitary lesions. Findings are most consistent with multifocal pneumonia with suspicion for concurrent septic emboli. 3. No substantial change in size of dominant basilar left lower lobe irregular mass measuring 4.6 x 2.1 cm. Slightly decreased size of subpleural right lower lobe nodule measuring 2.2 x 1.3 cm. 4. New multi station lymphadenopathy, likely reactive. 5. Unchanged splenomegaly. 6. Aortic Atherosclerosis (ICD10-I70.0). Coronary artery calcifications. Assessment for potential risk factor modification, dietary therapy or pharmacologic therapy may be warranted, if clinically indicated. Critical Value/emergent results were called by telephone at the  time of interpretation on 06/01/2023 at 2:06 pm to provider [facility] , who verbally acknowledged these results. Electronically Signed   By: Agustin Cree M.D.   On: 06/01/2023 14:13   DG Chest Port 1 View  Result Date: 06/01/2023 CLINICAL DATA:  Questionable sepsis.  Chest soreness EXAM: PORTABLE CHEST 1 VIEW COMPARISON:  X-ray 01/15/2011.  PET-CT 04/29/2023 FINDINGS: No pneumothorax or effusion. Normal cardiopericardial silhouette. No edema. Overlapping cardiac leads. Right IJ chest port with tip overlying the central SVC. The port is accessed multifocal consolidative opacities identified greatest in the medial right lung base also more peripherally in the right midlung and towards the lingula. Patient does have a history of lung cancer IMPRESSION: Chest port. Multifocal parenchymal opacities along the lower lungs. Possible acute infiltrate. Recommend follow-up. Please correlate with the history of known lung cancer in the prior PET CT of 04/29/2023. Recommend follow up Electronically Signed   By: Karen Kays M.D.   On: 06/01/2023 09:53        Scheduled Meds:  arformoterol  15 mcg Nebulization BID   And   umeclidinium bromide  1 puff Inhalation Daily   Chlorhexidine Gluconate Cloth  6 each Topical Daily   nicotine  14 mg Transdermal Daily   pantoprazole  40 mg Oral BID   sodium chloride flush  10-40 mL Intracatheter Q12H   Continuous Infusions:  sodium chloride 50 mL/hr at 06/02/23 0848   azithromycin     cefTRIAXone (ROCEPHIN)  IV     heparin 1,450 Units/hr (06/02/23 0848)     LOS: 1 day    Time spent: 40 minutes    Dorcas Carrow, MD [facility]

## 2023-06-02 NOTE — Progress Notes (Addendum)
ANTICOAGULATION CONSULT NOTE - follow up  Pharmacy Consult for heparin>> apixaban Indication: pulmonary embolus  No Known Allergies  Patient Measurements: Height: 5\' 11"  (180.3 cm) Weight: 64 kg (141 lb 1.5 oz) IBW/kg (Calculated) : 75.3 Heparin Dosing Weight: 63.6 kg  Vital Signs: Temp: 97.8 F (36.6 C) (08/22 0359) Temp Source: Oral (08/22 0359) BP: 89/67 (08/22 0800) Pulse Rate: 97 (08/22 0800)  Labs: Recent Labs    05/30/23 1356 06/01/23 0849 06/02/23 0010 06/02/23 0732  HGB 10.2* 9.6*  --  8.5*  HCT 28.8* 28.4*  --  25.4*  PLT 153 141*  --  141*  APTT  --  34  --   --   LABPROT  --  17.6*  --   --   INR  --  1.4*  --   --   HEPARINUNFRC  --   --  0.10* 0.17*  CREATININE 1.28* 1.28* 1.01 0.95  TROPONINIHS  --  10  --   --     Estimated Creatinine Clearance: 73.9 mL/min (by C-G formula based on SCr of 0.95 mg/dL).   Assessment: 62 year old male presented to the cancer center for IVF, having problems with worsening shortness of breath. He was placed on O2 and sent to the ED for further evaluation. He has recent diagnosis of lung cancer metastatic to brain s/p craniotomy in June and started on chemotherapy. CTA positive "filling defect within right upper lobe anterior segmental artery, consistent with pulmonary embolism. No evidence of right heart strain." Also positive for bilateral DVT. No prior anticoagulation noted. Given history of craniotomy, Neurosurgery asked to weigh in regarding safety of anticoagulation - okay to heparinize at normal dosing given two months post-op.  Baseline CBC with slight drop in Hgb; plt stable. Patient with first chemotherapy 8/5.   06/02/2023 Heparin level 0.17 - subtherapeutic after rate increased from 1050 units/hr to 1250 units/hr Per d/w RN - heparin is infusing well with no line issues Hg 9.6> 8.5 PLT 141 -stable SCr 0.95 No bleeding reported by RN To transition to apixaban today D/w TRH MD> give full loading dose of apixa  10 mg bid x 7 days   Goal of Therapy:  Heparin level 0.3-0.7 units/ml Monitor platelets by anticoagulation protocol: Yes   Plan:  DC heparin drip Apixaban 10 mg po bid x 7 days (confirmed with MD loading dose desired) followed by apixaban 5 mg po bid Will educate and provide 30 day free card prior to discharge Heparin levels & CBCs dc'd  Herby Abraham, Pharm.D Use secure chat for questions 06/02/2023 9:55 AM

## 2023-06-02 NOTE — Progress Notes (Signed)
ANTICOAGULATION CONSULT NOTE - follow up  Pharmacy Consult for heparin Indication: pulmonary embolus  No Known Allergies  Patient Measurements: Height: 5\' 11"  (180.3 cm) Weight: 64 kg (141 lb 1.5 oz) IBW/kg (Calculated) : 75.3 Heparin Dosing Weight: 63.6 kg  Vital Signs: Temp: 97.2 F (36.2 C) (08/21 2343) Temp Source: Axillary (08/21 2343) BP: 94/64 (08/21 1622) Pulse Rate: 95 (08/21 1622)  Labs: Recent Labs    05/30/23 1356 06/01/23 0849 06/02/23 0010  HGB 10.2* 9.6*  --   HCT 28.8* 28.4*  --   PLT 153 141*  --   APTT  --  34  --   LABPROT  --  17.6*  --   INR  --  1.4*  --   HEPARINUNFRC  --   --  0.10*  CREATININE 1.28* 1.28*  --   TROPONINIHS  --  10  --     Estimated Creatinine Clearance: 54.9 mL/min (A) (by C-G formula based on SCr of 1.28 mg/dL (H)).   Medical History: Past Medical History:  Diagnosis Date   Dyslipidemia    GERD (gastroesophageal reflux disease)    Lung nodule seen on imaging study 03/31/2023   seen on CT results   Osteoarthritis    Smoker    long term smoker 1 ppd     Assessment: 62 year old male presented to the cancer center for IVF, having problems with worsening shortness of breath. He was placed on O2 and sent to the ED for further evaluation. He has recent diagnosis of lung cancer metastatic to brain s/p craniotomy in June and started on chemotherapy. CTA positive "filling defect within right upper lobe anterior segmental artery, consistent with pulmonary embolism. No evidence of right heart strain." Also positive for bilateral DVT. No prior anticoagulation noted. Given history of craniotomy, Neurosurgery asked to weigh in regarding safety of anticoagulation - okay to heparinize at normal dosing given two months post-op.  Baseline CBC with slight drop in Hgb; plt stable. Patient with first chemotherapy 8/5.    HL 0.1 subtherapeutic on 1050 units/hr No bleeding noted  Goal of Therapy:  Heparin level 0.3-0.7  units/ml Monitor platelets by anticoagulation protocol: Yes   Plan:  - No bolus per MD -increase heparin drip to 1250 units/hr -Check 6 hour heparin level -Daily CBC, heparin level while on heparin; monitor closely for signs/symptoms of bleeding   Arley Phenix RPh 06/02/2023, 12:41 AM

## 2023-06-02 NOTE — Telephone Encounter (Signed)
Scheduled patient for HFU with Katie on 9/25. Reminder mailed. This information is also printed on discharge papers. Nothing further needed.

## 2023-06-03 DIAGNOSIS — Z7189 Other specified counseling: Secondary | ICD-10-CM

## 2023-06-03 DIAGNOSIS — Z515 Encounter for palliative care: Secondary | ICD-10-CM

## 2023-06-03 DIAGNOSIS — R531 Weakness: Secondary | ICD-10-CM

## 2023-06-03 DIAGNOSIS — I2699 Other pulmonary embolism without acute cor pulmonale: Secondary | ICD-10-CM | POA: Diagnosis not present

## 2023-06-03 LAB — COMPREHENSIVE METABOLIC PANEL
ALT: 26 U/L (ref 0–44)
AST: 34 U/L (ref 15–41)
Albumin: 1.5 g/dL — ABNORMAL LOW (ref 3.5–5.0)
Alkaline Phosphatase: 318 U/L — ABNORMAL HIGH (ref 38–126)
Anion gap: 7 (ref 5–15)
BUN: 20 mg/dL (ref 8–23)
CO2: 24 mmol/L (ref 22–32)
Calcium: 7.3 mg/dL — ABNORMAL LOW (ref 8.9–10.3)
Chloride: 101 mmol/L (ref 98–111)
Creatinine, Ser: 0.82 mg/dL (ref 0.61–1.24)
GFR, Estimated: >60 mL/min (ref >60)
Glucose, Bld: 89 mg/dL (ref 70–99)
Potassium: 3.5 mmol/L (ref 3.5–5.1)
Sodium: 132 mmol/L — ABNORMAL LOW (ref 135–145)
Total Bilirubin: 1 mg/dL (ref 0.3–1.2)
Total Protein: 5.2 g/dL — ABNORMAL LOW (ref 6.5–8.1)

## 2023-06-03 LAB — CBC WITH DIFFERENTIAL/PLATELET
Abs Immature Granulocytes: 1.7 10*3/uL — ABNORMAL HIGH (ref 0.00–0.07)
Band Neutrophils: 3 %
Basophils Absolute: 0 10*3/uL (ref 0.0–0.1)
Basophils Relative: 0 %
Eosinophils Absolute: 0 10*3/uL (ref 0.0–0.5)
Eosinophils Relative: 0 %
HCT: 25.9 % — ABNORMAL LOW (ref 39.0–52.0)
Hemoglobin: 8.5 g/dL — ABNORMAL LOW (ref 13.0–17.0)
Lymphocytes Relative: 4 %
Lymphs Abs: 0.7 10*3/uL (ref 0.7–4.0)
MCH: 30.1 pg (ref 26.0–34.0)
MCHC: 32.8 g/dL (ref 30.0–36.0)
MCV: 91.8 fL (ref 80.0–100.0)
Metamyelocytes Relative: 6 %
Monocytes Absolute: 0.5 10*3/uL (ref 0.1–1.0)
Monocytes Relative: 3 %
Myelocytes: 4 %
Neutro Abs: 13.8 10*3/uL — ABNORMAL HIGH (ref 1.7–7.7)
Neutrophils Relative %: 80 %
Platelets: 144 10*3/uL — ABNORMAL LOW (ref 150–400)
RBC: 2.82 MIL/uL — ABNORMAL LOW (ref 4.22–5.81)
RDW: 18.4 % — ABNORMAL HIGH (ref 11.5–15.5)
WBC: 16.6 10*3/uL — ABNORMAL HIGH (ref 4.0–10.5)
nRBC: 0.8 % — ABNORMAL HIGH (ref 0.0–0.2)

## 2023-06-03 LAB — PHOSPHORUS: Phosphorus: 3.3 mg/dL (ref 2.5–4.6)

## 2023-06-03 LAB — MAGNESIUM: Magnesium: 2.2 mg/dL (ref 1.7–2.4)

## 2023-06-03 MED ORDER — HYDROCODONE-ACETAMINOPHEN 10-325 MG PO TABS
1.0000 | ORAL_TABLET | Freq: Four times a day (QID) | ORAL | Status: DC | PRN
  Administered 2023-06-03: 1 via ORAL
  Filled 2023-06-03: qty 1

## 2023-06-03 MED ORDER — MORPHINE SULFATE ER 15 MG PO TBCR
15.0000 mg | EXTENDED_RELEASE_TABLET | Freq: Two times a day (BID) | ORAL | Status: DC
  Administered 2023-06-03 – 2023-06-04 (×2): 15 mg via ORAL
  Filled 2023-06-03 (×2): qty 1

## 2023-06-03 MED ORDER — TRAMADOL HCL 50 MG PO TABS: 50.0000 mg | ORAL_TABLET | Freq: Four times a day (QID) | ORAL | Status: DC | PRN

## 2023-06-03 MED ORDER — MORPHINE SULFATE (CONCENTRATE) 10 MG/0.5ML PO SOLN
10.0000 mg | ORAL | Status: DC | PRN
  Administered 2023-06-03: 10 mg via SUBLINGUAL
  Filled 2023-06-03: qty 0.5

## 2023-06-03 MED FILL — Dexamethasone Sodium Phosphate Inj 100 MG/10ML: INTRAMUSCULAR | Qty: 1 | Status: AC

## 2023-06-03 MED FILL — Fosaprepitant Dimeglumine For IV Infusion 150 MG (Base Eq): INTRAVENOUS | Qty: 5 | Status: AC

## 2023-06-03 NOTE — Progress Notes (Signed)
PROGRESS NOTE    JIRAIYA FORSHEE  ZOX:[id number] DOB: 31-May-1961 DOA: 06/01/2023 PCP: [name], MD    Brief Narrative:  62 year old former smoker, GERD, non-small cell lung cancer with metastatic disease, brain mets status postcraniotomy 6/30 as well as intracranial radiation currently undergoing chemotherapy presented with shortness of breath, cough.  Found to have pulmonary embolism, bilateral lower leg DVT.  On 2 L oxygen in the ER.   Assessment & Plan:   Acute pulmonary embolism without cor pulmonale, bilateral lower extremity extensive DVT: Thromboembolism due to metastatic non-small cell lung cancer. Hypoxemia -No right heart strain on CT scan.  Recent craniectomy within 2 months.  Case discussed with neurosurgery and started on heparin.  -Called and discussed case with oncology, started on Eliquis and monitor today. -Given oxygen to keep saturation more than 90%, incentive spirometry and breathing treatments.  Evaluate for home oxygen. -Adequate pain management.  Community-acquired pneumonia: Suspected.  Patient with multiple findings on his CT scan of the chest which could be related to postsurgical changes.  Empiric antibiotics with Rocephin and azithromycin.  Metastatic lung adenocarcinoma: Followed by oncology.  On chemotherapy.  Hyponatremia: Suspect SIADH.  On maintenance fluid.  Fairly stable.  Discontinue further IV fluids.  Focus on nutrition.  GERD: On Protonix.  Goal of care: Poor prognosis.  Remains full code.  Oncology following. Needs more pain management today, on hydrocodone and tramadol at home.  Increased frequency to hydrocodone 10 mg every 6 hours, tramadol 50 mg every 6 hours to alternate. Patient with very poor prognosis, he will need palliative care discussion and starting conversation about goal of care.  Will consult palliative care.   DVT prophylaxis:  apixaban (ELIQUIS) tablet 10 mg  apixaban (ELIQUIS) tablet 5 mg   Code Status: Full  code Family Communication: None at the bedside Disposition Plan: Status is: Inpatient Remains inpatient appropriate because: Severe debility, oxygen need, monitoring for bleeding.  Can transfer out of ICU to progressive bed.     Consultants:  Oncology Pulmonary critical care Palliative care  Procedures:  None  Antimicrobials:  Rocephin azithromycin 8/21--   Subjective: Patient seen in the morning rounds.  Inadequate pain control.  Appetite is fair.  At rest he feels fine, feels dizzy on standing at the side of the bed.  Oxygen drops on minimal mobility.  Denies any headache, nausea or vomiting.  Objective: Vitals:   06/03/23 0810 06/03/23 0900 06/03/23 1008 06/03/23 1100  BP:  103/63    Pulse:  (!) 116 (!) 126 (!) 116  Resp:  20 (!) 23 (!) 21  Temp:      TempSrc:      SpO2: 98% 96% (!) 88% 99%  Weight:      Height:        Intake/Output Summary (Last 24 hours) at 06/03/2023 1137 Last data filed at 06/03/2023 0933 Gross per 24 hour  Intake 1257.02 ml  Output --  Net 1257.02 ml   Filed Weights   06/01/23 0811 06/01/23 1622  Weight: 63.6 kg 64 kg    Examination:  General: Frail.  Sick looking. Alert awake and oriented.  Anxious. Cardiovascular: S1-S2 normal.  Regular rate rhythm.  Tachycardic. Respiratory: Bilateral conducted upper airway sounds.  Poor inspiratory effort. Port-A-Cath present on the right chest wall. Gastrointestinal: Soft.  Nontender.  Bowel sound present. Ext: Mild tenderness left calf.  No edema or swelling. Neuro: Intact.  Gross generalized weakness. Musculoskeletal: No deformities.     Data Reviewed: I have personally reviewed  following labs and imaging studies  CBC: Recent Labs  Lab 05/30/23 1356 06/01/23 0849 06/02/23 0732 06/03/23 0511  WBC 4.5 9.9 14.4* 16.6*  NEUTROABS 2.8 7.4  --  13.8*  HGB 10.2* 9.6* 8.5* 8.5*  HCT 28.8* 28.4* 25.4* 25.9*  MCV 84.5 87.9 90.1 91.8  PLT 153 141* 141* 144*   Basic Metabolic  Panel: Recent Labs  Lab 05/30/23 1356 06/01/23 0849 06/02/23 0010 06/02/23 0732 06/03/23 0511  NA 127* 125* 127* 129* 132*  K 4.0 3.8 3.7 3.7 3.5  CL 92* 92* 96* 98 101  CO2 26 21* 24 24 24   GLUCOSE 131* 108* 110* 107* 89  BUN 44* 39* 33* 27* 20  CREATININE 1.28* 1.28* 1.01 0.95 0.82  CALCIUM 7.9* 7.4* 7.0* 7.1* 7.3*  MG  --   --   --   --  2.2  PHOS  --   --   --   --  3.3   GFR: Estimated Creatinine Clearance: 85.6 mL/min (by C-G formula based on SCr of 0.82 mg/dL). Liver Function Tests: Recent Labs  Lab 05/30/23 1356 06/01/23 0849 06/02/23 0732 06/03/23 0511  AST 28 30 31  34  ALT 28 31 26 26   ALKPHOS 243* 292* 268* 318*  BILITOT 1.5* 2.2* 1.2 1.0  PROT 6.0* 5.8* 5.0* 5.2*  ALBUMIN 2.6* 1.9* 1.6* 1.5*   No results for input(s): "LIPASE", "AMYLASE" in the last 168 hours. No results for input(s): "AMMONIA" in the last 168 hours. Coagulation Profile: Recent Labs  Lab 06/01/23 0849  INR 1.4*   Cardiac Enzymes: No results for input(s): "CKTOTAL", "CKMB", "CKMBINDEX", "TROPONINI" in the last 168 hours. BNP (last 3 results) No results for input(s): "PROBNP" in the last 8760 hours. HbA1C: No results for input(s): "HGBA1C" in the last 72 hours. CBG: No results for input(s): "GLUCAP" in the last 168 hours. Lipid Profile: No results for input(s): "CHOL", "HDL", "LDLCALC", "TRIG", "CHOLHDL", "LDLDIRECT" in the last 72 hours. Thyroid Function Tests: No results for input(s): "TSH", "T4TOTAL", "FREET4", "T3FREE", "THYROIDAB" in the last 72 hours. Anemia Panel: No results for input(s): "VITAMINB12", "FOLATE", "FERRITIN", "TIBC", "IRON", "RETICCTPCT" in the last 72 hours. Sepsis Labs: Recent Labs  Lab 06/01/23 0848 06/01/23 0939  LATICACIDVEN 1.4 1.5    Recent Results (from the past 240 hour(s))  Blood Culture (routine x 2)     Status: None (Preliminary result)   Collection Time: 06/01/23  9:12 AM   Specimen: BLOOD  Result Value Ref Range Status   Specimen  Description   Final    BLOOD LEFT ANTECUBITAL Performed at [facility], [address], [place], [place] [address]    Special Requests   Final    BOTTLES DRAWN AEROBIC AND ANAEROBIC Blood Culture adequate volume Performed at [facility], [address], [place], [place] [address]    Culture   Final    NO GROWTH 2 DAYS Performed at [facility], [address] [place], [place] [address]    Report Status PENDING  Incomplete  Blood Culture (routine x 2)     Status: None (Preliminary result)   Collection Time: 06/01/23  9:15 AM   Specimen: BLOOD  Result Value Ref Range Status   Specimen Description   Final    BLOOD PORTA CATH LEFT Performed at [facility], [address], [place], [place] [address]    Special Requests   Final    BOTTLES DRAWN AEROBIC AND ANAEROBIC Blood Culture adequate volume Performed  at [facility], [address], [place], [place] [address]    Culture   Final    NO GROWTH 2 DAYS Performed at [facility], [address], [place], [place] [address]    Report Status PENDING  Incomplete  Resp panel by RT-PCR (RSV, Flu A&B, Covid) Anterior Nasal Swab     Status: None   Collection Time: 06/01/23 10:37 AM   Specimen: Anterior Nasal Swab  Result Value Ref Range Status   SARS Coronavirus 2 by RT PCR NEGATIVE NEGATIVE Final    Comment: (NOTE) SARS-CoV-2 target nucleic acids are NOT DETECTED.  The SARS-CoV-2 RNA is generally detectable in upper respiratory specimens during the acute phase of infection. The lowest concentration of SARS-CoV-2 viral copies this assay can detect is 138 copies/mL. A negative result does not preclude SARS-Cov-2 infection and should not be used as the sole basis for treatment or other patient management decisions. A negative result may occur with  improper specimen collection/handling, submission of specimen other than nasopharyngeal swab,  presence of viral mutation(s) within the areas targeted by this assay, and inadequate number of viral copies(<138 copies/mL). A negative result must be combined with clinical observations, patient history, and epidemiological information. The expected result is Negative.  Fact Sheet for Patients:  BloggerCourse.com  Fact Sheet for Healthcare Providers:  SeriousBroker.it  This test is no t yet approved or cleared by the [place] FDA and  has been authorized for detection and/or diagnosis of SARS-CoV-2 by FDA under an Emergency Use Authorization (EUA). This EUA will remain  in effect (meaning this test can be used) for the duration of the COVID-19 declaration under Section 564(b)(1) of the Act, 21 U.S.C.section 360bbb-3(b)(1), unless the authorization is terminated  or revoked sooner.       Influenza A by PCR NEGATIVE NEGATIVE Final   Influenza B by PCR NEGATIVE NEGATIVE Final    Comment: (NOTE) The Xpert Xpress SARS-CoV-2/FLU/RSV plus assay is intended as an aid in the diagnosis of influenza from Nasopharyngeal swab specimens and should not be used as a sole basis for treatment. Nasal washings and aspirates are unacceptable for Xpert Xpress SARS-CoV-2/FLU/RSV testing.  Fact Sheet for Patients: BloggerCourse.com  Fact Sheet for Healthcare Providers: SeriousBroker.it  This test is not yet approved or cleared by the [place] FDA and has been authorized for detection and/or diagnosis of SARS-CoV-2 by FDA under an Emergency Use Authorization (EUA). This EUA will remain in effect (meaning this test can be used) for the duration of the COVID-19 declaration under Section 564(b)(1) of the Act, 21 U.S.C. section 360bbb-3(b)(1), unless the authorization is terminated or revoked.     Resp Syncytial Virus by PCR NEGATIVE NEGATIVE Final    Comment: (NOTE) Fact Sheet for  Patients: BloggerCourse.com  Fact Sheet for Healthcare Providers: SeriousBroker.it  This test is not yet approved or cleared by the [place] FDA and has been authorized for detection and/or diagnosis of SARS-CoV-2 by FDA under an Emergency Use Authorization (EUA). This EUA will remain in effect (meaning this test can be used) for the duration of the COVID-19 declaration under Section 564(b)(1) of the Act, 21 U.S.C. section 360bbb-3(b)(1), unless the authorization is terminated or revoked.  Performed at [facility], [address], [place], [place] [address]   MRSA Next Gen by PCR, Nasal     Status: None   Collection Time: 06/01/23  4:39 PM   Specimen: Nasal Mucosa; Nasal Swab  Result Value Ref Range  Status   MRSA by PCR Next Gen NOT DETECTED NOT DETECTED Final    Comment: (NOTE) The GeneXpert MRSA Assay (FDA approved for NASAL specimens only), is one component of a comprehensive MRSA colonization surveillance program. It is not intended to diagnose MRSA infection nor to guide or monitor treatment for MRSA infections. Test performance is not FDA approved in patients less than 71 years old. Performed at [facility], [address] [place], [place] [address]          Radiology Studies: VAS [place] LOWER EXTREMITY VENOUS (DVT)  Result Date: 06/01/2023  Lower Venous DVT Study Patient Name:  TSHAWN VOLBRECHT  Date of Exam:   06/01/2023 Medical Rec #: [id number]     Accession #:    [id number] Date of Birth: [birth year]      Patient Gender: M Patient Age:   33 years Exam Location:  [facility] Procedure:      VAS [place] LOWER EXTREMITY VENOUS (DVT) Referring Phys: MIR [facility] --------------------------------------------------------------------------------  Indications: Pulmonary embolism.  Risk Factors: Confirmed PE Cancer. Comparison Study: No prior studies. Performing Technologist: Chanda Busing RVT  Examination Guidelines: A complete evaluation includes B-mode imaging, spectral Doppler, color Doppler, and power Doppler as needed of all accessible portions of each vessel. Bilateral testing is considered an integral part of a complete examination. Limited examinations for reoccurring indications may be performed as noted. The reflux portion of the exam is performed with the patient in reverse Trendelenburg.  +-----------------+---------------+---------+-----------+----------+----------+ RIGHT            CompressibilityPhasicitySpontaneityPropertiesThrombus                                                                 Aging      +-----------------+---------------+---------+-----------+----------+----------+ CFV              Full           Yes      Yes                             +-----------------+---------------+---------+-----------+----------+----------+ SFJ              Full                                                    +-----------------+---------------+---------+-----------+----------+----------+ FV Prox          Full                                                    +-----------------+---------------+---------+-----------+----------+----------+ FV Mid           Full                                                    +-----------------+---------------+---------+-----------+----------+----------+ FV  Distal        Full                                                    +-----------------+---------------+---------+-----------+----------+----------+ PFV              Full                                                    +-----------------+---------------+---------+-----------+----------+----------+ POP              None           No       No                   Acute      +-----------------+---------------+---------+-----------+----------+----------+ PTV              Full                                                     +-----------------+---------------+---------+-----------+----------+----------+ PERO             Full                                                    +-----------------+---------------+---------+-----------+----------+----------+ Gastroc          Partial                                      Acute      +-----------------+---------------+---------+-----------+----------+----------+ Tibioperoneal    Partial                                      Acute      trunk                                                                    +-----------------+---------------+---------+-----------+----------+----------+   +---------+---------------+---------+-----------+----------+--------------+ LEFT     CompressibilityPhasicitySpontaneityPropertiesThrombus Aging +---------+---------------+---------+-----------+----------+--------------+ CFV      Full           Yes      Yes                                 +---------+---------------+---------+-----------+----------+--------------+ SFJ      Full                                                        +---------+---------------+---------+-----------+----------+--------------+  FV Prox  Full                                                        +---------+---------------+---------+-----------+----------+--------------+ FV Mid   Full                                                        +---------+---------------+---------+-----------+----------+--------------+ FV DistalFull                                                        +---------+---------------+---------+-----------+----------+--------------+ PFV      Full                                                        +---------+---------------+---------+-----------+----------+--------------+ POP      None           No       No                   Acute          +---------+---------------+---------+-----------+----------+--------------+ PTV      Full                                                         +---------+---------------+---------+-----------+----------+--------------+ PERO     Full                                                        +---------+---------------+---------+-----------+----------+--------------+ Soleal   None                                         Acute          +---------+---------------+---------+-----------+----------+--------------+ Gastroc  None                                         Acute          +---------+---------------+---------+-----------+----------+--------------+     Summary: RIGHT: - Findings consistent with acute deep vein thrombosis involving the right popliteal vein, tibioperoneal trunk, and right gastrocnemius veins. - No cystic structure found in the popliteal fossa.  LEFT: - Findings consistent with acute deep vein thrombosis involving the left popliteal vein, left soleal veins, and left gastrocnemius veins. - No cystic structure found in the popliteal fossa.  *See table(s) above  for measurements and observations. Electronically signed by Heath Lark on 06/01/2023 at 8:50:30 PM.    Final    CT Angio Chest PE W and/or Wo Contrast  Result Date: 06/01/2023 CLINICAL DATA:  History of left lower lobe non-small-cell lung cancer with shortness of breath, hypoxia, and tachycardia EXAM: CT ANGIOGRAPHY CHEST WITH CONTRAST TECHNIQUE: Multidetector CT imaging of the chest was performed using the standard protocol during bolus administration of intravenous contrast. Multiplanar CT image reconstructions and MIPs were obtained to evaluate the vascular anatomy. RADIATION DOSE REDUCTION: This exam was performed according to the departmental dose-optimization program which includes automated exposure control, adjustment of the mA and/or kV according to patient size and/or use of iterative reconstruction technique. CONTRAST:  80mL OMNIPAQUE IOHEXOL 350 MG/ML SOLN COMPARISON:  CT chest dated  03/31/2023, nuclear medicine PET dated 04/29/2023 FINDINGS: Cardiovascular: Right chest wall port terminates in the lower SVC. The study is high quality for the evaluation of pulmonary embolism. Filling defect within right upper lobe anterior segmental artery. Great vessels are normal in course and caliber. Normal heart size. No significant pericardial fluid/thickening. Coronary artery calcifications and aortic atherosclerosis. Mediastinum/Nodes: Imaged thyroid gland without nodules meeting criteria for imaging follow-up by size. Normal esophagus. No substantial change in size of subcarinal 1.7 cm lymph node (5:85). New multi station lymphadenopathy, for example 1.3 cm AP window (5:64), 1.0 cm left hilar (5:84), and 1.1 cm right hilar (5:85). Lungs/Pleura: The central airways are patent. Lower lobe predominant peripheral ground-glass opacities and irregular consolidations throughout all lobes. New multifocal thick-walled cavitary lesions, for example central left upper lobe measuring 1.9 x 1.2 cm (13:57) no substantial change in size of dominant basilar left lower lobe irregular mass measuring 4.6 x 2.1 cm (remeasured, 13:116). Slightly decreased size of subpleural right lower lobe nodule measuring 2.2 x 1.3 cm (13:122), previously 2.4 x 1.5 cm. No pneumothorax. No pleural effusion. Upper abdomen: Unchanged enlarged spleen, 13.8 cm in AP dimension. Cholelithiasis. Musculoskeletal: No acute or abnormal lytic or blastic osseous lesions. Review of the MIP images confirms the above findings. IMPRESSION: 1. Filling defect within right upper lobe anterior segmental artery, consistent with pulmonary embolism. No evidence of right heart strain. 2. Lower lobe predominant peripheral ground-glass opacities and irregular consolidations throughout all lobes, with new multifocal thick-walled cavitary lesions. Findings are most consistent with multifocal pneumonia with suspicion for concurrent septic emboli. 3. No substantial  change in size of dominant basilar left lower lobe irregular mass measuring 4.6 x 2.1 cm. Slightly decreased size of subpleural right lower lobe nodule measuring 2.2 x 1.3 cm. 4. New multi station lymphadenopathy, likely reactive. 5. Unchanged splenomegaly. 6. Aortic Atherosclerosis (ICD10-I70.0). Coronary artery calcifications. Assessment for potential risk factor modification, dietary therapy or pharmacologic therapy may be warranted, if clinically indicated. Critical Value/emergent results were called by telephone at the time of interpretation on 06/01/2023 at 2:06 pm to provider [facility] , who verbally acknowledged these results. Electronically Signed   By: Agustin Cree M.D.   On: 06/01/2023 14:13        Scheduled Meds:  apixaban  10 mg Oral BID   Followed by   Melene Muller ON 06/09/2023] apixaban  5 mg Oral BID   arformoterol  15 mcg Nebulization BID   And   umeclidinium bromide  1 puff Inhalation Daily   azithromycin  500 mg Oral Daily   Chlorhexidine Gluconate Cloth  6 each Topical Daily   nicotine  14 mg Transdermal Daily   pantoprazole  40 mg Oral  BID   sodium chloride flush  10-40 mL Intracatheter Q12H   Continuous Infusions:  cefTRIAXone (ROCEPHIN)  IV Stopped (06/02/23 1505)     LOS: 2 days    Time spent: 40 minutes    Dorcas Carrow, MD [facility]

## 2023-06-03 NOTE — Plan of Care (Signed)
  Problem: Education: Goal: Knowledge of General Education information will improve Description: Including pain rating scale, medication(s)/side effects and non-pharmacologic comfort measures 06/03/2023 0253 by Bernerd Pho, RN Outcome: Progressing 06/03/2023 0253 by Bernerd Pho, RN Outcome: Progressing   Problem: Health Behavior/Discharge Planning: Goal: Ability to manage health-related needs will improve 06/03/2023 0253 by Bernerd Pho, RN Outcome: Progressing 06/03/2023 0253 by Bernerd Pho, RN Outcome: Progressing   Problem: Clinical Measurements: Goal: Ability to maintain clinical measurements within normal limits will improve 06/03/2023 0253 by Bernerd Pho, RN Outcome: Progressing 06/03/2023 0253 by Bernerd Pho, RN Outcome: Progressing Goal: Will remain free from infection 06/03/2023 0253 by Bernerd Pho, RN Outcome: Progressing 06/03/2023 0253 by Bernerd Pho, RN Outcome: Progressing Goal: Diagnostic test results will improve 06/03/2023 0253 by Bernerd Pho, RN Outcome: Progressing 06/03/2023 0253 by Bernerd Pho, RN Outcome: Progressing Goal: Respiratory complications will improve 06/03/2023 0253 by Bernerd Pho, RN Outcome: Progressing 06/03/2023 0253 by Bernerd Pho, RN Outcome: Progressing Goal: Cardiovascular complication will be avoided 06/03/2023 0253 by Bernerd Pho, RN Outcome: Progressing 06/03/2023 0253 by Bernerd Pho, RN Outcome: Progressing   Problem: Activity: Goal: Risk for activity intolerance will decrease 06/03/2023 0253 by Bernerd Pho, RN Outcome: Progressing 06/03/2023 0253 by Bernerd Pho, RN Outcome: Progressing   Problem: Nutrition: Goal: Adequate nutrition will be maintained 06/03/2023 0253 by Bernerd Pho, RN Outcome: Progressing 06/03/2023 0253 by Bernerd Pho, RN Outcome: Progressing   Problem: Coping: Goal: Level of anxiety will decrease 06/03/2023 0253 by Bernerd Pho, RN Outcome:  Progressing 06/03/2023 0253 by Bernerd Pho, RN Outcome: Progressing   Problem: Elimination: Goal: Will not experience complications related to bowel motility 06/03/2023 0253 by Bernerd Pho, RN Outcome: Progressing 06/03/2023 0253 by Bernerd Pho, RN Outcome: Progressing Goal: Will not experience complications related to urinary retention 06/03/2023 0253 by Bernerd Pho, RN Outcome: Progressing 06/03/2023 0253 by Bernerd Pho, RN Outcome: Progressing   Problem: Pain Managment: Goal: General experience of comfort will improve 06/03/2023 0253 by Bernerd Pho, RN Outcome: Progressing 06/03/2023 0253 by Bernerd Pho, RN Outcome: Progressing   Problem: Safety: Goal: Ability to remain free from injury will improve 06/03/2023 0253 by Bernerd Pho, RN Outcome: Progressing 06/03/2023 0253 by Bernerd Pho, RN Outcome: Progressing   Problem: Skin Integrity: Goal: Risk for impaired skin integrity will decrease 06/03/2023 0253 by Bernerd Pho, RN Outcome: Progressing 06/03/2023 0253 by Bernerd Pho, RN Outcome: Progressing   Cindy S. Clelia Croft BSN, RN, [employer], CCRN 06/03/2023 2:54 AM

## 2023-06-03 NOTE — Progress Notes (Addendum)
   06/03/23 1600  Spiritual Encounters  Type of Visit Initial  Care provided to: Pt and family  Referral source Chaplain team  Reason for visit Urgent spiritual support  OnCall Visit No  Spiritual Framework  Presenting Themes Meaning/purpose/sources of inspiration;Values and beliefs;Significant life change;Coping tools;Impactful experiences and emotions;Courage hope and growth;Rituals and practive  Community/Connection Family;Friend(s)  Patient Stress Factors Loss of control;Major life changes  Family Stress Factors Major life changes;Other (Comment) (Anticipatory Grief and unexpressed complicated grief from loss of adult daughter)  Interventions  Spiritual Care Interventions Made Established relationship of care and support;Compassionate presence;Reflective listening;Normalization of emotions;Reconciliation with self/others;Explored values/beliefs/practices/strengths  Intervention Outcomes  Outcomes Awareness around self/spiritual resourses;Awareness of health;Awareness of support;Reduced anxiety   Met with patient and wife at bedside.  Provided spiritual care in the form of emotional support.  Wife and patient expressed their desire to complete AD paperwork and are in process of reading through and discussing more fully their desire and decision.  Matthew Rivera was emotional while speaking of his family, especially his son and grand children.  Wife expressed her unresolved grief of losing her adult daygheter in recent years.  Both expressed deep love for one another.  Provided spiriutal support in the firm of compassionate listening and counsel.  Review outline of the AD paperwork and answered their questions.

## 2023-06-03 NOTE — Evaluation (Signed)
Physical Therapy Evaluation Patient Details Name: Matthew Rivera MRN: [id number] DOB: 08/05/1961 Today's Date: 06/03/2023  History of Present Illness  62-year-old former smoker, GERD, non-small cell lung cancer with metastatic disease, brain mets status postcraniotomy 6/30 as well as intracranial radiation currently undergoing chemotherapy presented 06/01/23  with shortness of breath, cough.  Found to have pulmonary embolism, bilateral lower leg DVT.  On 2 L oxygen in the ER.  Clinical Impression  Pt admitted with above diagnosis.  Pt currently with functional limitations due to the deficits listed below (see PT Problem List). Pt will benefit from acute skilled PT to increase their independence and safety with mobility to allow discharge.    The patient   agreeable  to ambulation.  Patient  resting in recliner on 3 LPM , SPo2 955. Patient ambulated x ~ 40' on 3L, with drop to 81%, stannding break and increased  to 5 LPM per RN. Patient ambulated x 40' SPo2 88% on  5 LPM. Seated rest break then ambulated x 80' on 5 LPM with spo2  remaining 90%. Continue PT for mobility.         If plan is discharge home, recommend the following: A little help with walking and/or transfers;Help with stairs or ramp for entrance;Assistance with cooking/housework;Assist for transportation   Can travel by private vehicle        Equipment Recommendations None recommended by PT  Recommendations for Other Services       Functional Status Assessment Patient has had a recent decline in their functional status and demonstrates the ability to make significant improvements in function in a reasonable and predictable amount of time.     Precautions / Restrictions Precautions Precaution Comments: on O2      Mobility  Bed Mobility               General bed mobility comments: in recliner    Transfers Overall transfer level: Needs assistance Equipment used: Rolling walker (2 wheels) Transfers: Sit to/from  Stand Sit to Stand: Contact guard assist           General transfer comment: extra effort from recliner.    Ambulation/Gait Ambulation/Gait assistance: Contact guard assist Gait Distance (Feet): 80 Feet (x 2) Assistive device: Rolling walker (2 wheels) Gait Pattern/deviations: Step-through pattern Gait velocity: decr     General Gait Details: gait is slow, no balance loss using RW  Stairs            Wheelchair Mobility     Tilt Bed    Modified Rankin (Stroke Patients Only)       Balance Overall balance assessment: Needs assistance Sitting-balance support: No upper extremity supported, Feet supported Sitting balance-Leahy Scale: Fair     Standing balance support: During functional activity, Bilateral upper extremity supported, Reliant on assistive device for balance Standing balance-Leahy Scale: Poor                               Pertinent Vitals/Pain Pain Assessment Pain Assessment: No/denies pain    Home Living Family/patient expects to be discharged to:: Private residence Living Arrangements: Spouse/significant other Available Help at Discharge: Family;Available 24 hours/day Type of Home: House Home Access: Stairs to enter   [employer] of Steps: 4   Home Layout: One level Home Equipment: Agricultural consultant (2 wheels)      Prior Function Prior Level of Function : Needs assist       Physical Assist :  Mobility (physical)             Extremity/Trunk Assessment   Upper Extremity Assessment Upper Extremity Assessment: Generalized weakness    Lower Extremity Assessment Lower Extremity Assessment: Generalized weakness    Cervical / Trunk Assessment Cervical / Trunk Assessment: Normal  Communication   Communication Communication: No apparent difficulties  Cognition Arousal: Alert Behavior During Therapy: WFL for tasks assessed/performed Overall Cognitive Status: Within Functional Limits for tasks assessed                                           General Comments      Exercises     Assessment/Plan    PT Assessment Patient needs continued PT services  PT Problem List Decreased strength;Decreased mobility;Decreased safety awareness;Decreased activity tolerance;Decreased knowledge of use of DME;Cardiopulmonary status limiting activity       PT Treatment Interventions DME instruction;Therapeutic activities;Gait training;Therapeutic exercise;Functional mobility training;Stair training    PT Goals (Current goals can be found in the Care Plan section)  Acute Rehab PT Goals Patient Stated Goal: go home PT Goal Formulation: With patient/family Time For Goal Achievement: 06/17/23 Potential to Achieve Goals: Good    Frequency Min 1X/week     Co-evaluation               AM-PAC PT "6 Clicks" Mobility  Outcome Measure Help needed turning from your back to your side while in a flat bed without using bedrails?: None Help needed moving from lying on your back to sitting on the side of a flat bed without using bedrails?: A Little Help needed moving to and from a bed to a chair (including a wheelchair)?: A Little Help needed standing up from a chair using your arms (e.g., wheelchair or bedside chair)?: A Little Help needed to walk in hospital room?: A Little Help needed climbing 3-5 steps with a railing? : A Lot 6 Click Score: 18    End of Session   Activity Tolerance: Patient tolerated treatment well Patient left: in bed;with nursing/sitter in room;with family/visitor present;with call bell/phone within reach Nurse Communication: Mobility status PT Visit Diagnosis: Unsteadiness on feet (R26.81);Muscle weakness (generalized) (M62.81);Difficulty in walking, not elsewhere classified (R26.2)    Time: 1305-1330 PT Time Calculation (min) (ACUTE ONLY): 25 min   Charges:   PT Evaluation $PT Eval Low Complexity: 1 Low PT Treatments $Gait Training: 8-22 mins PT General  Charges $$ ACUTE PT VISIT: 1 Visit         Blanchard Kelch PT Acute Rehabilitation Services Office [phone] Weekend [phone]   Rada Hay 06/03/2023, 1:46 PM

## 2023-06-03 NOTE — Consult Note (Signed)
Consultation Note Date: 06/03/2023   Patient Name: Matthew Rivera  DOB: 09-16-1961  MRN: [id number]  Age / Sex: 62 y.o., male  PCP: [name], MD Referring Physician: Dorcas Carrow, MD  Reason for Consultation: Non pain symptom management and Pain control  HPI/Patient Profile: 62 y.o. male   admitted on 06/01/2023   62 year old former smoker, GERD, non-small cell lung cancer with metastatic disease, brain mets status postcraniotomy 6/30 as well as intracranial radiation currently undergoing chemotherapy presented with shortness of breath, cough.  Found to have pulmonary embolism, bilateral lower leg DVT.     Clinical Assessment and Goals of Care: Patient remains mated to the heart medicine service for acute pulmonary embolism, bilateral lower extremity extensive DVT.  He has thromboembolism due to metastatic non-small cell lung cancer.  Also with suspected community-acquired pneumonia multiple findings on CT scan of the chest, underlying serious illness of metastatic lung adenocarcinoma. Palliative care consulted for pain and non-- pain symptom management. Chart reviewed, palliative consult request received, patient seen and examined.  His wife was at bedside.  Initial palliative consultation completed alongside nursing colleague from the stepdown unit present in the room. Palliative medicine is specialized medical care for people living with serious illness. It focuses on providing relief from the symptoms and stress of a serious illness. The goal is to improve quality of life for both the patient and the family. Goals of care: Broad aims of medical therapy in relation to the patient's values and preferences. Our aim is to provide medical care aimed at enabling patients to achieve the goals that matter most to them, given the circumstances of their particular medical situation and their constraints.   Brief  life review performed.  Patient is from [facility].  He lives with his wife.  He worked in [employer], hospital.  We discussed about his symptom burden.  Patient complains of air hunger, complains of shortness of breath even with the slightest exertion.  He was not on home oxygen but is now on 3 L supplemental oxygen via nasal cannula.  Patient follows with Dr. Shirline Frees.  He has been on hydrocodone for several years because of chronic back pain.  He was also taking tramadol.  Patient states that he does not have adequate symptom control.   NEXT OF KIN Lives at home with his wife.  He has an adult son.  He also has grandchildren.  SUMMARY OF RECOMMENDATIONS   Pain and Dyspnea management:  D/C Hydrocodone and Tramadol  Begin MS Contin 15 mg PO BID and begin Morphine SL 10 mg Q 3 hours PRN for pain/dyspnea and monitor.   Chaplain consult for advance directives - HCPOA paperwork preparation.   Full Code, Full Scope care.  Thank you for the consult.   Code Status/Advance Care Planning: Full code   Symptom Management:  As above  Palliative Prophylaxis:  Bowel Regimen  Additional Recommendations (Limitations, Scope, Preferences): Full Scope Treatment  Psycho-social/Spiritual:  Desire for further Chaplaincy support:yes Additional Recommendations: Caregiving  Support/Resources  Prognosis:  Unable to determine  Discharge Planning: Home with Palliative Services      Primary Diagnoses: Present on Admission:  Acute pulmonary embolism (HCC)   I have reviewed the medical record, interviewed the patient and family, and examined the patient. The following aspects are pertinent.  Past Medical History:  Diagnosis Date   Dyslipidemia    GERD (gastroesophageal reflux disease)    Lung nodule seen on imaging study 03/31/2023   seen on CT results   Osteoarthritis    Smoker    long term smoker 1 ppd   Social History   Socioeconomic History   Marital status: Married     Spouse name: Not on file   Number of children: Not on file   Years of education: Not on file   Highest education level: Not on file  Occupational History   Not on file  Tobacco Use   Smoking status: Every Day    Current packs/day: 0.50    Average packs/day: 1.5 packs/day for 52.6 years (78.3 ttl pk-yrs)    Types: Cigarettes    Start date: 3   Smokeless tobacco: Never   Tobacco comments:    Cutting back and wears nicotine patch since 03/31/23.  KM  Vaping Use   Vaping status: Never Used  Substance and Sexual Activity   Alcohol use: Not Currently    Comment: occas   Drug use: Never   Sexual activity: Yes  Other Topics Concern   Not on file  Social History Narrative   Not on file   Social Determinants of Health   Financial Resource Strain: Not on file  Food Insecurity: No Food Insecurity (03/31/2023)   Hunger Vital Sign    Worried About Running Out of Food in the Last Year: Never true    Ran Out of Food in the Last Year: Never true  Transportation Needs: No Transportation Needs (03/31/2023)   PRAPARE - Administrator, Civil Service (Medical): No    Lack of Transportation (Non-Medical): No  Physical Activity: Not on file  Stress: Not on file  Social Connections: Not on file   Family History  Problem Relation Age of Onset   Diabetes Father    Asthma Father    Scheduled Meds:  apixaban  10 mg Oral BID   Followed by   Melene Muller ON 06/09/2023] apixaban  5 mg Oral BID   arformoterol  15 mcg Nebulization BID   And   umeclidinium bromide  1 puff Inhalation Daily   azithromycin  500 mg Oral Daily   Chlorhexidine Gluconate Cloth  6 each Topical Daily   morphine  15 mg Oral Q12H   nicotine  14 mg Transdermal Daily   pantoprazole  40 mg Oral BID   sodium chloride flush  10-40 mL Intracatheter Q12H   Continuous Infusions:  cefTRIAXone (ROCEPHIN)  IV 1 g (06/03/23 1437)   PRN Meds:.acetaminophen **OR** acetaminophen, albuterol, methocarbamol, morphine  CONCENTRATE, ondansetron **OR** ondansetron (ZOFRAN) IV, mouth rinse, sodium chloride flush, tamsulosin Medications Prior to Admission:  Prior to Admission medications   Medication Sig Start Date End Date Taking? Authorizing Provider  albuterol (VENTOLIN HFA) 108 (90 Base) MCG/ACT inhaler Inhale 2 puffs into the lungs every 4 (four) hours as needed for wheezing or shortness of breath.   Yes [provider]  docusate sodium (COLACE) 100 MG capsule Take 100 mg by mouth in the morning.   Yes [provider]  EXCEDRIN TENSION HEADACHE 500-65 MG TABS per tablet Take 1-2  tablets by mouth every 6 (six) hours as needed (for headaches).   Yes [provider]  fluconazole (DIFLUCAN) 100 MG tablet Take 2 tablets today, then 1 tablet daily x 20 more days. Do not take any statin medications for cholesterol while on this. Patient taking differently: Take 100 mg by mouth See admin instructions. Starting on 05/07/2023, take 100 mg by mouth once a day for 20 days. Do not take any statin medications for cholesterol while on this. 05/06/23  Yes Lonie Peak, MD  folic acid (FOLVITE) 1 MG tablet Take 1 tablet (1 mg total) by mouth daily. 05/04/23  Yes Heilingoetter, Cassandra L, PA-C  HYDROcodone-acetaminophen (NORCO) 10-325 MG tablet Take 1 tablet by mouth every 4 (four) hours as needed for moderate pain.   Yes [provider]  lidocaine-prilocaine (EMLA) cream Apply 1 Application topically as needed. Patient taking differently: Apply 1 Application topically as needed (for port access- every 21 days). 05/04/23  Yes Heilingoetter, Cassandra L, PA-C  LORazepam (ATIVAN) 0.5 MG tablet Take 1 tablet (0.5 mg total) by mouth as needed for anxiety. Take 1-2 tablet by mouth 20 minutes before MRI or radiation treatment. Patient taking differently: Take 0.5-1 mg by mouth See admin instructions. Take 0.5-1 mg by mouth 20 minutes before MRI or radiation treatment. 05/03/23  Yes Erven Colla, PA-C   Naphazoline-Pheniramine (VISINE OP) Place 1 drop into both eyes daily as needed (dry eyes).   Yes [provider]  nicotine (NICODERM CQ - DOSED IN MG/24 HOURS) 14 mg/24hr patch Place 1 patch (14 mg total) onto the skin daily. 05/10/23  Yes Charlott Holler, MD  ondansetron (ZOFRAN-ODT) 4 MG disintegrating tablet Take 1 tablet (4 mg total) by mouth every 8 (eight) hours as needed for nausea or vomiting. Patient taking differently: Take 4 mg by mouth every 8 (eight) hours as needed for nausea or vomiting (DISSOLVE ORALLY). 05/24/23  Yes Heilingoetter, Cassandra L, PA-C  pantoprazole (PROTONIX) 40 MG tablet Take 1 tablet (40 mg total) by mouth 2 (two) times daily. Patient taking differently: Take 40 mg by mouth daily before breakfast. 04/01/23  Yes Rhetta Mura, MD  prochlorperazine (COMPAZINE) 10 MG tablet Take 1 tablet (10 mg total) by mouth every 6 (six) hours as needed. Patient taking differently: Take 10 mg by mouth every 6 (six) hours as needed for nausea or vomiting. 05/04/23  Yes Heilingoetter, Cassandra L, PA-C  tamsulosin (FLOMAX) 0.4 MG CAPS capsule Take 0.4 mg by mouth at bedtime as needed (urinary flow issues). 03/01/23  Yes [provider]  Tiotropium Bromide-Olodaterol (STIOLTO RESPIMAT) 2.5-2.5 MCG/ACT AERS Inhale 2 puffs into the lungs daily. 05/10/23  Yes Charlott Holler, MD  traMADol (ULTRAM) 50 MG tablet Take 50-100 mg by mouth 4 (four) times daily as needed for moderate pain. 07/11/20  Yes [provider]  zolpidem (AMBIEN) 10 MG tablet Take 10 mg by mouth at bedtime as needed for sleep. 12/22/22  Yes [provider]  dexamethasone (DECADRON) 2 MG tablet Take 1 tablet twice a day for 1 week, followed by 1 tablet daily for 1 week, then stop Patient not taking: Reported on 06/01/2023 05/04/23   Heilingoetter, Cassandra L, PA-C   No Known Allergies Review of Systems + Weakness + Air hunger + Shortness of breath Physical Exam Frail appearing,  awake alert oriented Appears with generalized weakness Tachycardic, monitor noted No generalized edema Abdomen not distended Has Port-A-Cath  Vital Signs: BP 103/63   Pulse (!) 109   Temp 98  F (36.7 C) (Oral)   Resp 15   Ht 5\' 11"  (1.803 m)   Wt 64 kg   SpO2 98%   BMI 19.68 kg/m  Pain Scale: 0-10   Pain Score: 5    SpO2: SpO2: 98 % O2 Device:SpO2: 98 % O2 Flow Rate: .O2 Flow Rate (L/min): 3 L/min  IO: Intake/output summary:  Intake/Output Summary (Last 24 hours) at 06/03/2023 1447 Last data filed at 06/03/2023 0933 Gross per 24 hour  Intake 1257.02 ml  Output --  Net 1257.02 ml    LBM: Last BM Date :  (PTA) Baseline Weight: Weight: 63.6 kg Most recent weight: Weight: 64 kg     Palliative Assessment/Data:   Palliative performance scale 60%.  Time In: 1400 Time Out:  1500 Time Total:  60 Greater than 50%  of this time was spent counseling and coordinating care related to the above assessment and plan.  Signed by: Rosalin Hawking, MD   Please contact Palliative Medicine Team phone at [phone] for questions and concerns.  For individual provider: See Loretha Stapler

## 2023-06-03 NOTE — Progress Notes (Signed)
Patient ambulated approximately 50 feet in total with a rest period after 25 feet. Lowest oxygen saturation seen by myself was 76% but waveform was not great due to movement. PT used front wheel walker, and appeared to have a steady gait for the entirety of the walk. At this time I do not feel like it is in the best interest of the patient to attempt an ambulation trial without oxygen in place at all times. Oxygen needs at rest are 3L, during ambulation was 5-6L which kept his sats between 86-91% on average. PT has been oxygen dependent for entirety of his stay.

## 2023-06-03 NOTE — TOC Initial Note (Signed)
Transition of Care [facility]) - Initial/Assessment Note    Patient Details  Name: Matthew Rivera MRN: [id number] Date of Birth: 1961/03/02  Transition of [facility]) CM/SW Contact:    Lavenia Atlas, RN Phone Number: 06/03/2023, 3:07 PM  Clinical Narrative:      Per chart review patient currently in [facility] SDU for acute pulmonary embolism. PT has recommended HHPT and home oxygen. Awaiting HHPT and home oxygen orders. Ambulation notes to be completed, before referral to [facility] for oxygen. Palliative orders are on file, awaiting palliative discussion for GOC..   Transportation at discharge: wife [name] will continue to follow for needs.              Expected Discharge Plan: Home w Home Health Services Barriers to Discharge: Continued Medical Work up   Patient Goals and CMS Choice Patient states their goals for this hospitalization and ongoing recovery are:: to feel better CMS Medicare.gov Compare Post Acute Care list provided to:: Patient Represenative (must comment) (Spouse: [name]) Choice offered to / list presented to : Spouse [name] ownership interest in [facility].provided to:: Spouse    Expected Discharge Plan and Services In-house Referral: Hospice / Palliative Care Discharge Planning Services: CM Consult Post Acute Care Choice: Durable Medical Equipment, Home Health Living arrangements for the past 2 months: Single Family Home                 DME Arranged: Oxygen                    Prior Living Arrangements/Services Living arrangements for the past 2 months: Single Family Home Lives with:: Spouse Patient language and need for interpreter reviewed:: Yes Do you feel safe going back to the place where you live?: Yes      Need for Family Participation in Patient Care: Yes (Comment) Care giver support system in place?: Yes (comment) Current home services: Other (comment) (none) Criminal Activity/Legal Involvement Pertinent to Current  Situation/Hospitalization: No - Comment as needed  Activities of Daily Living   ADL Screening (condition at time of admission) Patient's cognitive ability adequate to safely complete daily activities?: No Is the patient deaf or have difficulty hearing?: No Does the patient have difficulty seeing, even when wearing glasses/contacts?: No Does the patient have difficulty concentrating, remembering, or making decisions?: No Patient able to express need for assistance with ADLs?: Yes Does the patient have difficulty dressing or bathing?: Yes Independently performs ADLs?: No Does the patient have difficulty walking or climbing stairs?: Yes Weakness of Legs: Both Weakness of Arms/Hands: Both  Permission Sought/Granted Permission sought to share information with : Case Manager Permission granted to share information with : Yes, Verbal Permission Granted  Share Information with NAME: Case Manager           Emotional Assessment Appearance:: Appears stated age Attitude/Demeanor/Rapport: Gracious Affect (typically observed): Accepting Orientation: : Oriented to Self, Oriented to Place, Oriented to  Time Alcohol / Substance Use: Not Applicable Psych Involvement: No (comment)  Admission diagnosis:  Acute pulmonary embolism (HCC) [I26.99] Multifocal pneumonia [J18.9] Single subsegmental pulmonary embolism without acute cor pulmonale (HCC) [I26.93] Acute hypoxic respiratory failure (HCC) [J96.01] Patient Active Problem List   Diagnosis Date Noted   Acute pulmonary embolism (HCC) 06/01/2023   Port-A-Cath in place 05/16/2023   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 [place] [address] Phone: [phone] Fax: [phone]     Social Determinants of Health (SDOH) Social History: SDOH Screenings   Food Insecurity: No Food Insecurity (03/31/2023)  Housing: Low Risk  (03/31/2023)  Transportation Needs: No Transportation Needs (03/31/2023)  Utilities: Not At Risk (03/31/2023)  Alcohol Screen: Low Risk  (05/03/2023)  Depression (PHQ2-9): Low Risk  (05/02/2023)  Tobacco Use: High Risk (06/01/2023)   SDOH Interventions:     Readmission Risk Interventions    06/03/2023    1:54 PM  Readmission Risk Prevention Plan  Transportation Screening Complete  PCP or Specialist Appt within 3-5 Days Complete  HRI or Home Care Consult Complete  Social Work Consult for Recovery Care Planning/Counseling Complete  Palliative Care Screening Not Complete  Palliative Care Screening Not Complete Comments MD has orderes palliative for GOC  Medication Review Oceanographer) Complete

## 2023-06-03 NOTE — Progress Notes (Signed)
SATURATION QUALIFICATIONS: (This note is used to comply with regulatory documentation for home oxygen)  Patient Saturations on Room Air at Rest = NT%  Patient Saturations on Room Air while Ambulating = NT%(required O2  for amb.)  Patient Saturations on 3 Liters of oxygen while Ambulating = 81%  Please briefly explain why patient needs home oxygen:Patient requires supplemental oxygen to maintain Saturation >88% while performing ADL's such as ambulation.  Blanchard Kelch PT Acute Rehabilitation Services Office [phone] Weekend [phone]

## 2023-06-03 NOTE — Progress Notes (Signed)
Matthew Rivera presents today for follow up after completing SRS radiation treatment to his brain on 05-16-23.   Recent neurologic symptoms, if any:  Seizures: no Headaches: no Nausea: no Dizziness/ataxia: no Difficulty with hand coordination: no Focal numbness/weakness: yes, weakness continues but he also remains on chemo Visual deficits/changes: no Confusion/Memory deficits: no  Other issues of note: Pt reports he is doing well from the radiation stand point. He does report that chemo is making him tired overall. Pt reports they have had to adjust his pain medications due how they were making him feel. He reports that has greatly helped. He has two chemo treatments left.

## 2023-06-04 ENCOUNTER — Encounter: Payer: Self-pay | Admitting: Internal Medicine

## 2023-06-04 ENCOUNTER — Other Ambulatory Visit (HOSPITAL_COMMUNITY): Payer: Self-pay

## 2023-06-04 DIAGNOSIS — R531 Weakness: Secondary | ICD-10-CM

## 2023-06-04 DIAGNOSIS — I2699 Other pulmonary embolism without acute cor pulmonale: Secondary | ICD-10-CM | POA: Diagnosis not present

## 2023-06-04 DIAGNOSIS — Z515 Encounter for palliative care: Secondary | ICD-10-CM

## 2023-06-04 DIAGNOSIS — Z7189 Other specified counseling: Secondary | ICD-10-CM

## 2023-06-04 MED ORDER — APIXABAN 5 MG PO TABS: 5.0000 mg | ORAL_TABLET | Freq: Two times a day (BID) | ORAL | 2 refills | Status: DC

## 2023-06-04 MED ORDER — MORPHINE SULFATE ER 15 MG PO TBCR: 15.0000 mg | EXTENDED_RELEASE_TABLET | Freq: Two times a day (BID) | ORAL | 0 refills | Status: DC

## 2023-06-04 MED ORDER — ONDANSETRON 4 MG PO TBDP
4.0000 mg | ORAL_TABLET | Freq: Once | ORAL | Status: AC
  Administered 2023-06-04: 4 mg via ORAL
  Filled 2023-06-04: qty 1

## 2023-06-04 MED ORDER — HEPARIN SOD (PORK) LOCK FLUSH 100 UNIT/ML IV SOLN
500.0000 [IU] | INTRAVENOUS | Status: AC | PRN
  Administered 2023-06-04: 500 [IU]

## 2023-06-04 MED ORDER — APIXABAN (ELIQUIS) VTE STARTER PACK (10MG AND 5MG)
ORAL_TABLET | ORAL | 0 refills | Status: DC
  Filled 2023-06-04: qty 74, 30d supply, fill #0

## 2023-06-04 MED ORDER — SACCHAROMYCES BOULARDII 250 MG PO CAPS: 250.0000 mg | ORAL_CAPSULE | Freq: Two times a day (BID) | ORAL | 0 refills | Status: AC

## 2023-06-04 MED ORDER — AMOXICILLIN-POT CLAVULANATE 875-125 MG PO TABS
1.0000 | ORAL_TABLET | Freq: Two times a day (BID) | ORAL | Status: DC
  Administered 2023-06-04: 1 via ORAL
  Filled 2023-06-04: qty 1

## 2023-06-04 MED ORDER — MORPHINE SULFATE (CONCENTRATE) 10 MG/0.5ML PO SOLN: 10.0000 mg | ORAL | 0 refills | Status: DC | PRN

## 2023-06-04 MED ORDER — AMOXICILLIN-POT CLAVULANATE 875-125 MG PO TABS: 1.0000 | ORAL_TABLET | Freq: Two times a day (BID) | ORAL | 0 refills | Status: AC

## 2023-06-04 MED ORDER — APIXABAN (ELIQUIS) VTE STARTER PACK (10MG AND 5MG): ORAL_TABLET | ORAL | 0 refills | Status: DC

## 2023-06-04 NOTE — Discharge Summary (Signed)
Physician Discharge Summary  Matthew Rivera QMV:[id number] DOB: 1961/09/28 DOA: 06/01/2023  PCP: [name], MD  Admit date: 06/01/2023 Discharge date: 06/04/2023  Admitted From: Home Discharge disposition: Home with home health PT and oxygen  Recommendations at discharge:  Continue antibiotics for 7 more days with probiotics You have been started on blood thinner.  Watch out for frank bleeding or excessive bruisability. You been started on long-acting pain control with MS Contin as well as as needed morphine sublingual for pain/dyspnea. Follow-up with oncologist as an outpatient.  Brief narrative: Matthew Rivera is a 62 y.o. male with PMH significant for previous smoking, NSCLC with metastasis, brain mets s/p craniotomy 6/30, intracranial radiation, ongoing chemotherapy 8/21, patient presented to ED with complaint of shortness of breath, cough CT angio chest showed acute pulm embolism and right upper lobe anterior segment artery. It also showed lower lobe predominant peripheral ground-glass opacities and irregular consolidations throughout all lobes, with new multifocal thick-walled cavitary lesions. Findings are most consistent with multifocal pneumonia with suspicion for concurrent septic emboli. Admitted to [facility] Started on anticoagulation  Subjective: Patient was seen and examined this morning.  Middle-aged Caucasian male.  Propped up in bed.  Not in distress.  On 3 L oxygen by nasal cannula.  Wife at bedside. We discussed about patient's blood thinner, pneumonia, antibiotics.  Patient and wife both feel more comfortable with discharge than staying another night. Chart reviewed In the last 24 hours, afebrile, tachycardia improving, blood pressure in low 100s, on 3 L oxygen by nasal cannula  Assessment and plan: Acute pulmonary embolism without cor pulmonale bilateral lower extremity extensive DVT Thromboembolism in the setting of malignancy Per previous hospitalist note, case  was discussed with neurosurgery and oncology.  Initially started on heparin drip and later switched to Eliquis.  Discharged on Eliquis  Multifocal pneumonia Cavitary lesions CT chest also showed irregular consolidation throughout all lobes as well as thick-walled cavitary lesions.  Findings are most consistent with multifocal pneumonia and concurrent septic emboli. No fever.  WBC count elevated.  Lactic acid level normal Currently on IV Rocephin, azithromycin Switch to oral Augmentin to continue for next 7 days with probiotics Recent Labs  Lab 05/30/23 1356 06/01/23 0848 06/01/23 0849 06/01/23 0939 06/02/23 0732 06/03/23 0511  WBC 4.5  --  9.9  --  14.4* 16.6*  LATICACIDVEN  --  1.4  --  1.5  --   --    Acute respiratory failure with hypoxia Secondary to PE, pneumonia and lung cancer Currently on 3 L oxygen by nasal cannula.  Checked ambulatory oxygen requirement.  Home oxygen arranged.  NSCLC with metastasis brain mets s/p craniotomy 6/30, intracranial radiation,  ongoing chemotherapy Follows up with Dr. Arbutus Ped as an outpatient For pain management, palliative care was consulted.  Hydrocodone and tramadol were discontinued.  Started on MS Contin 15 mg p.o. twice daily and morphine sublingual 10 mg every 3 hours as needed for pain/dyspnea.  Continue the same plan  Hyponatremia Suspect SIADH Sodium trend as below, showing improvement with fluid restriction.  Continue same. Recent Labs  Lab 05/30/23 1356 06/01/23 0849 06/02/23 0010 06/02/23 0732 06/03/23 0511  NA 127* 125* 127* 129* 132*   GERD Continue Protonix.   Mobility: PT eval obtained.  Home health PT recommended.  Goals of care   Code Status: Full Code.  Prognosis poor.  To follow-up with palliative care at cancer center  Wounds:  -    Discharge Exam:   Vitals:   06/04/23  0800 06/04/23 0835 06/04/23 0900 06/04/23 1000  BP: 103/71   101/67  Pulse: 95 95 95 (!) 103  Resp:      Temp:  98.1 F (36.7 C)     TempSrc:  Oral    SpO2: 100% 99% 98% (!) 88%  Weight:      Height:        Body mass index is 19.68 kg/m.  General exam: Pleasant middle-aged Caucasian male. Skin: No rashes, lesions or ulcers. HEENT: Atraumatic, normocephalic, no obvious bleeding Lungs: Clear to auscultation bilaterally CVS: Regular rate and rhythm, no murmur GI/Abd soft, nontender, nondistended, bowel sound present CNS: Alert, awake, oriented x 3 Psychiatry: Mood appropriate  Extremities: No pedal edema, no calf tenderness  Follow ups:    Follow-up Information     [facility] Follow up.   Contact information: [address] [place] [address] [phone]                Discharge Instructions:   Discharge Instructions     Call MD for:  difficulty breathing, headache or visual disturbances   Complete by: As directed    Call MD for:  extreme fatigue   Complete by: As directed    Call MD for:  hives   Complete by: As directed    Call MD for:  persistant dizziness or light-headedness   Complete by: As directed    Call MD for:  persistant nausea and vomiting   Complete by: As directed    Call MD for:  severe uncontrolled pain   Complete by: As directed    Call MD for:  temperature >100.4   Complete by: As directed    Diet general   Complete by: As directed    Discharge instructions   Complete by: As directed    Recommendations at discharge:   Continue antibiotics for 7 more days with probiotics  You have been started on blood thinner.  Watch out for frank bleeding or excessive bruisability.  You been started on long-acting pain control with MS Contin as well as as needed morphine sublingual for pain/dyspnea.  Follow-up with oncologist as an outpatient.  General discharge instructions: Follow with Primary MD [name], MD in 7 days  Please request your PCP  to go over your hospital tests, procedures, radiology  results at the follow up. Please get your medicines reviewed and adjusted.  Your PCP may decide to repeat certain labs or tests as needed. Do not drive, operate heavy machinery, perform activities at heights, swimming or participation in water activities or provide baby sitting services if your were admitted for syncope or siezures until you have seen by Primary MD or a Neurologist and advised to do so again. [place] Controlled Substance Reporting System database was reviewed. Do not drive, operate heavy machinery, perform activities at heights, swim, participate in water activities or provide baby-sitting services while on medications for pain, sleep and mood until your outpatient physician has reevaluated you and advised to do so again.  You are strongly recommended to comply with the dose, frequency and duration of prescribed medications. Activity: As tolerated with Full fall precautions use walker/cane & assistance as needed Avoid using any recreational substances like cigarette, tobacco, alcohol, or non-prescribed drug. If you experience worsening of your admission symptoms, develop shortness of breath, life threatening emergency, suicidal or homicidal thoughts you must seek medical attention immediately by calling 911 or calling your MD immediately  if symptoms less severe. You must read complete instructions/literature along with all the possible adverse reactions/side effects for all the medicines you take and that have been prescribed to you. Take any new medicine only after you have completely understood and accepted all the possible adverse reactions/side effects.  Wear Seat belts while driving. You were cared for by a hospitalist during your hospital stay. If you have any questions about your discharge medications or the care you received while you were in the hospital after you are discharged, you can call the unit and ask to speak with the hospitalist or the covering physician. Once you  are discharged, your primary care physician will handle any further medical issues. Please note that NO REFILLS for any discharge medications will be authorized once you are discharged, as it is imperative that you return to your primary care physician (or establish a relationship with a primary care physician if you do not have one).   Increase activity slowly   Complete by: As directed        Discharge Medications:   Allergies as of 06/04/2023   No Known Allergies      Medication List     STOP taking these medications    dexamethasone 2 MG tablet Commonly known as: DECADRON   fluconazole 100 MG tablet Commonly known as: DIFLUCAN   HYDROcodone-acetaminophen 10-325 MG tablet Commonly known as: NORCO   traMADol 50 MG tablet Commonly known as: ULTRAM       TAKE these medications    albuterol 108 (90 Base) MCG/ACT inhaler Commonly known as: VENTOLIN HFA Inhale 2 puffs into the lungs every 4 (four) hours as needed for wheezing or shortness of breath.   amoxicillin-clavulanate 875-125 MG tablet Commonly known as: AUGMENTIN Take 1 tablet by mouth every 12 (twelve) hours for 7 days.   Apixaban Starter Pack (10mg  and 5mg ) Commonly known as: ELIQUIS STARTER PACK Take as directed on package: start with two-5mg  tablets twice daily for 7 days. On day 8, switch to one-5mg  tablet twice daily.   docusate sodium 100 MG capsule Commonly known as: COLACE Take 100 mg by mouth in the morning.   Excedrin Tension Headache 500-65 MG Tabs per tablet Generic drug: acetaminophen-caffeine Take 1-2 tablets by mouth every 6 (six) hours as needed (for headaches).   folic acid 1 MG tablet Commonly known as: FOLVITE Take 1 tablet (1 mg total) by mouth daily.   lidocaine-prilocaine cream Commonly known as: EMLA Apply 1 Application topically as needed. What changed: reasons to take this   LORazepam 0.5 MG tablet Commonly known as: ATIVAN Take 1 tablet (0.5 mg total) by mouth as needed  for anxiety. Take 1-2 tablet by mouth 20 minutes before MRI or radiation treatment. What changed:  how much to take when to take this additional instructions   morphine 15 MG 12 hr tablet Commonly known as: MS CONTIN Take 1 tablet (15 mg total) by mouth every 12 (twelve) hours for 5 days.   morphine CONCENTRATE 10 MG/0.5ML Soln concentrated solution Place 0.5 mLs (10 mg total) under the tongue every 3 (three) hours as needed for severe pain.   nicotine 14 mg/24hr patch Commonly known as: NICODERM CQ - dosed in mg/24 hours Place 1 patch (14 mg total) onto the skin daily.   ondansetron 4 MG disintegrating tablet Commonly known as: ZOFRAN-ODT Take 1 tablet (4 mg total) by mouth every 8 (eight) hours as needed for nausea or vomiting. What changed: reasons to take this  pantoprazole 40 MG tablet Commonly known as: PROTONIX Take 1 tablet (40 mg total) by mouth 2 (two) times daily. What changed: when to take this   prochlorperazine 10 MG tablet Commonly known as: COMPAZINE Take 1 tablet (10 mg total) by mouth every 6 (six) hours as needed. What changed: reasons to take this   saccharomyces boulardii 250 MG capsule Commonly known as: FLORASTOR Take 1 capsule (250 mg total) by mouth 2 (two) times daily for 7 days.   Stiolto Respimat 2.5-2.5 MCG/ACT Aers Generic drug: Tiotropium Bromide-Olodaterol Inhale 2 puffs into the lungs daily.   tamsulosin 0.4 MG Caps capsule Commonly known as: FLOMAX Take 0.4 mg by mouth at bedtime as needed (urinary flow issues).   VISINE OP Place 1 drop into both eyes daily as needed (dry eyes).   zolpidem 10 MG tablet Commonly known as: AMBIEN Take 10 mg by mouth at bedtime as needed for sleep.               Durable Medical Equipment  (From admission, onward)           Start     Ordered   06/03/23 1529  For home use only DME oxygen  Once       Question Answer Comment  Length of Need Lifetime   Mode or (Route) Nasal cannula    Liters per Minute 3   Frequency Continuous (stationary and portable oxygen unit needed)   Oxygen conserving device Yes   Oxygen delivery system Gas      06/03/23 1528             The results of significant diagnostics from this hospitalization (including imaging, microbiology, ancillary and laboratory) are listed below for reference.    Procedures and Diagnostic Studies:   VAS [place] LOWER EXTREMITY VENOUS (DVT)  Result Date: 06/01/2023  Lower Venous DVT Study Patient Name:  Matthew Rivera  Date of Exam:   06/01/2023 Medical Rec #: [id number]     Accession #:    [id number] Date of Birth: 1961-08-30      Patient Gender: M Patient Age:   62 years Exam Location:  [facility] Procedure:      VAS [place] LOWER EXTREMITY VENOUS (DVT) Referring Phys: MIR [facility] --------------------------------------------------------------------------------  Indications: Pulmonary embolism.  Risk Factors: Confirmed PE Cancer. Comparison Study: No prior studies. Performing Technologist: Chanda Busing RVT  Examination Guidelines: A complete evaluation includes B-mode imaging, spectral Doppler, color Doppler, and power Doppler as needed of all accessible portions of each vessel. Bilateral testing is considered an integral part of a complete examination. Limited examinations for reoccurring indications may be performed as noted. The reflux portion of the exam is performed with the patient in reverse Trendelenburg.  +-----------------+---------------+---------+-----------+----------+----------+ RIGHT            CompressibilityPhasicitySpontaneityPropertiesThrombus                                                                 Aging      +-----------------+---------------+---------+-----------+----------+----------+ CFV              Full           Yes      Yes                             +-----------------+---------------+---------+-----------+----------+----------+  SFJ              Full                                                     +-----------------+---------------+---------+-----------+----------+----------+ FV Prox          Full                                                    +-----------------+---------------+---------+-----------+----------+----------+ FV Mid           Full                                                    +-----------------+---------------+---------+-----------+----------+----------+ FV Distal        Full                                                    +-----------------+---------------+---------+-----------+----------+----------+ PFV              Full                                                    +-----------------+---------------+---------+-----------+----------+----------+ POP              None           No       No                   Acute      +-----------------+---------------+---------+-----------+----------+----------+ PTV              Full                                                    +-----------------+---------------+---------+-----------+----------+----------+ PERO             Full                                                    +-----------------+---------------+---------+-----------+----------+----------+ Gastroc          Partial                                      Acute      +-----------------+---------------+---------+-----------+----------+----------+ Tibioperoneal    Partial  Acute      trunk                                                                    +-----------------+---------------+---------+-----------+----------+----------+   +---------+---------------+---------+-----------+----------+--------------+ LEFT     CompressibilityPhasicitySpontaneityPropertiesThrombus Aging +---------+---------------+---------+-----------+----------+--------------+ CFV      Full           Yes      Yes                                  +---------+---------------+---------+-----------+----------+--------------+ SFJ      Full                                                        +---------+---------------+---------+-----------+----------+--------------+ FV Prox  Full                                                        +---------+---------------+---------+-----------+----------+--------------+ FV Mid   Full                                                        +---------+---------------+---------+-----------+----------+--------------+ FV DistalFull                                                        +---------+---------------+---------+-----------+----------+--------------+ PFV      Full                                                        +---------+---------------+---------+-----------+----------+--------------+ POP      None           No       No                   Acute          +---------+---------------+---------+-----------+----------+--------------+ PTV      Full                                                        +---------+---------------+---------+-----------+----------+--------------+ PERO     Full                                                        +---------+---------------+---------+-----------+----------+--------------+  Soleal   None                                         Acute          +---------+---------------+---------+-----------+----------+--------------+ Gastroc  None                                         Acute          +---------+---------------+---------+-----------+----------+--------------+     Summary: RIGHT: - Findings consistent with acute deep vein thrombosis involving the right popliteal vein, tibioperoneal trunk, and right gastrocnemius veins. - No cystic structure found in the popliteal fossa.  LEFT: - Findings consistent with acute deep vein thrombosis involving the left popliteal vein, left soleal veins, and left gastrocnemius  veins. - No cystic structure found in the popliteal fossa.  *See table(s) above for measurements and observations. Electronically signed by Heath Lark on 06/01/2023 at 8:50:30 PM.    Final    CT Angio Chest PE W and/or Wo Contrast  Result Date: 06/01/2023 CLINICAL DATA:  History of left lower lobe non-small-cell lung cancer with shortness of breath, hypoxia, and tachycardia EXAM: CT ANGIOGRAPHY CHEST WITH CONTRAST TECHNIQUE: Multidetector CT imaging of the chest was performed using the standard protocol during bolus administration of intravenous contrast. Multiplanar CT image reconstructions and MIPs were obtained to evaluate the vascular anatomy. RADIATION DOSE REDUCTION: This exam was performed according to the departmental dose-optimization program which includes automated exposure control, adjustment of the mA and/or kV according to patient size and/or use of iterative reconstruction technique. CONTRAST:  80mL OMNIPAQUE IOHEXOL 350 MG/ML SOLN COMPARISON:  CT chest dated 03/31/2023, nuclear medicine PET dated 04/29/2023 FINDINGS: Cardiovascular: Right chest wall port terminates in the lower SVC. The study is high quality for the evaluation of pulmonary embolism. Filling defect within right upper lobe anterior segmental artery. Great vessels are normal in course and caliber. Normal heart size. No significant pericardial fluid/thickening. Coronary artery calcifications and aortic atherosclerosis. Mediastinum/Nodes: Imaged thyroid gland without nodules meeting criteria for imaging follow-up by size. Normal esophagus. No substantial change in size of subcarinal 1.7 cm lymph node (5:85). New multi station lymphadenopathy, for example 1.3 cm AP window (5:64), 1.0 cm left hilar (5:84), and 1.1 cm right hilar (5:85). Lungs/Pleura: The central airways are patent. Lower lobe predominant peripheral ground-glass opacities and irregular consolidations throughout all lobes. New multifocal thick-walled cavitary lesions,  for example central left upper lobe measuring 1.9 x 1.2 cm (13:57) no substantial change in size of dominant basilar left lower lobe irregular mass measuring 4.6 x 2.1 cm (remeasured, 13:116). Slightly decreased size of subpleural right lower lobe nodule measuring 2.2 x 1.3 cm (13:122), previously 2.4 x 1.5 cm. No pneumothorax. No pleural effusion. Upper abdomen: Unchanged enlarged spleen, 13.8 cm in AP dimension. Cholelithiasis. Musculoskeletal: No acute or abnormal lytic or blastic osseous lesions. Review of the MIP images confirms the above findings. IMPRESSION: 1. Filling defect within right upper lobe anterior segmental artery, consistent with pulmonary embolism. No evidence of right heart strain. 2. Lower lobe predominant peripheral ground-glass opacities and irregular consolidations throughout all lobes, with new multifocal thick-walled cavitary lesions. Findings are most consistent with multifocal pneumonia with suspicion for concurrent septic emboli. 3. No substantial change in size of dominant basilar left lower lobe irregular mass  measuring 4.6 x 2.1 cm. Slightly decreased size of subpleural right lower lobe nodule measuring 2.2 x 1.3 cm. 4. New multi station lymphadenopathy, likely reactive. 5. Unchanged splenomegaly. 6. Aortic Atherosclerosis (ICD10-I70.0). Coronary artery calcifications. Assessment for potential risk factor modification, dietary therapy or pharmacologic therapy may be warranted, if clinically indicated. Critical Value/emergent results were called by telephone at the time of interpretation on 06/01/2023 at 2:06 pm to provider [facility] , who verbally acknowledged these results. Electronically Signed   By: Agustin Cree M.D.   On: 06/01/2023 14:13   DG Chest Port 1 View  Result Date: 06/01/2023 CLINICAL DATA:  Questionable sepsis.  Chest soreness EXAM: PORTABLE CHEST 1 VIEW COMPARISON:  X-ray 01/15/2011.  PET-CT 04/29/2023 FINDINGS: No pneumothorax or effusion. Normal cardiopericardial  silhouette. No edema. Overlapping cardiac leads. Right IJ chest port with tip overlying the central SVC. The port is accessed multifocal consolidative opacities identified greatest in the medial right lung base also more peripherally in the right midlung and towards the lingula. Patient does have a history of lung cancer IMPRESSION: Chest port. Multifocal parenchymal opacities along the lower lungs. Possible acute infiltrate. Recommend follow-up. Please correlate with the history of known lung cancer in the prior PET CT of 04/29/2023. Recommend follow up Electronically Signed   By: Karen Kays M.D.   On: 06/01/2023 09:53     Labs:   Basic Metabolic Panel: Recent Labs  Lab 05/30/23 1356 06/01/23 0849 06/02/23 0010 06/02/23 0732 06/03/23 0511  NA 127* 125* 127* 129* 132*  K 4.0 3.8 3.7 3.7 3.5  CL 92* 92* 96* 98 101  CO2 26 21* 24 24 24   GLUCOSE 131* 108* 110* 107* 89  BUN 44* 39* 33* 27* 20  CREATININE 1.28* 1.28* 1.01 0.95 0.82  CALCIUM 7.9* 7.4* 7.0* 7.1* 7.3*  MG  --   --   --   --  2.2  PHOS  --   --   --   --  3.3   GFR Estimated Creatinine Clearance: 85.6 mL/min (by C-G formula based on SCr of 0.82 mg/dL). Liver Function Tests: Recent Labs  Lab 05/30/23 1356 06/01/23 0849 06/02/23 0732 06/03/23 0511  AST 28 30 31  34  ALT 28 31 26 26   ALKPHOS 243* 292* 268* 318*  BILITOT 1.5* 2.2* 1.2 1.0  PROT 6.0* 5.8* 5.0* 5.2*  ALBUMIN 2.6* 1.9* 1.6* 1.5*   No results for input(s): "LIPASE", "AMYLASE" in the last 168 hours. No results for input(s): "AMMONIA" in the last 168 hours. Coagulation profile Recent Labs  Lab 06/01/23 0849  INR 1.4*    CBC: Recent Labs  Lab 05/30/23 1356 06/01/23 0849 06/02/23 0732 06/03/23 0511  WBC 4.5 9.9 14.4* 16.6*  NEUTROABS 2.8 7.4  --  13.8*  HGB 10.2* 9.6* 8.5* 8.5*  HCT 28.8* 28.4* 25.4* 25.9*  MCV 84.5 87.9 90.1 91.8  PLT 153 141* 141* 144*   Cardiac Enzymes: No results for input(s): "CKTOTAL", "CKMB", "CKMBINDEX",  "TROPONINI" in the last 168 hours. BNP: Invalid input(s): "POCBNP" CBG: No results for input(s): "GLUCAP" in the last 168 hours. D-Dimer No results for input(s): "DDIMER" in the last 72 hours. Hgb A1c No results for input(s): "HGBA1C" in the last 72 hours. Lipid Profile No results for input(s): "CHOL", "HDL", "LDLCALC", "TRIG", "CHOLHDL", "LDLDIRECT" in the last 72 hours. Thyroid function studies No results for input(s): "TSH", "T4TOTAL", "T3FREE", "THYROIDAB" in the last 72 hours.  Invalid input(s): "FREET3" Anemia work up No results for input(s): "VITAMINB12", "FOLATE", "FERRITIN", "  TIBC", "IRON", "RETICCTPCT" in the last 72 hours. Microbiology Recent Results (from the past 240 hour(s))  Blood Culture (routine x 2)     Status: None (Preliminary result)   Collection Time: 06/01/23  9:12 AM   Specimen: BLOOD  Result Value Ref Range Status   Specimen Description   Final    BLOOD LEFT ANTECUBITAL Performed at [facility], [address] [place], [place] [address]    Special Requests   Final    BOTTLES DRAWN AEROBIC AND ANAEROBIC Blood Culture adequate volume Performed at [facility], [address] [place], [place] [address]    Culture   Final    NO GROWTH 3 DAYS Performed at [facility], [address] [place], [place] [address]    Report Status PENDING  Incomplete  Blood Culture (routine x 2)     Status: None (Preliminary result)   Collection Time: 06/01/23  9:15 AM   Specimen: BLOOD  Result Value Ref Range Status   Specimen Description   Final    BLOOD PORTA CATH LEFT Performed at [facility], [address], [place], [place] [address]    Special Requests   Final    BOTTLES DRAWN AEROBIC AND ANAEROBIC Blood Culture adequate volume Performed at [facility], [address], [place], [place] [address]    Culture   Final    NO GROWTH 3 DAYS Performed at [facility],  [address] [place], [place] [address]    Report Status PENDING  Incomplete  Resp panel by RT-PCR (RSV, Flu A&B, Covid) Anterior Nasal Swab     Status: None   Collection Time: 06/01/23 10:37 AM   Specimen: Anterior Nasal Swab  Result Value Ref Range Status   SARS Coronavirus 2 by RT PCR NEGATIVE NEGATIVE Final    Comment: (NOTE) SARS-CoV-2 target nucleic acids are NOT DETECTED.  The SARS-CoV-2 RNA is generally detectable in upper respiratory specimens during the acute phase of infection. The lowest concentration of SARS-CoV-2 viral copies this assay can detect is 138 copies/mL. A negative result does not preclude SARS-Cov-2 infection and should not be used as the sole basis for treatment or other patient management decisions. A negative result may occur with  improper specimen collection/handling, submission of specimen other than nasopharyngeal swab, presence of viral mutation(s) within the areas targeted by this assay, and inadequate number of viral copies(<138 copies/mL). A negative result must be combined with clinical observations, patient history, and epidemiological information. The expected result is Negative.  Fact Sheet for Patients:  BloggerCourse.com  Fact Sheet for Healthcare Providers:  SeriousBroker.it  This test is no t yet approved or cleared by the [place] FDA and  has been authorized for detection and/or diagnosis of SARS-CoV-2 by FDA under an Emergency Use Authorization (EUA). This EUA will remain  in effect (meaning this test can be used) for the duration of the COVID-19 declaration under Section 564(b)(1) of the Act, 21 U.S.C.section 360bbb-3(b)(1), unless the authorization is terminated  or revoked sooner.       Influenza A by PCR NEGATIVE NEGATIVE Final   Influenza B by PCR NEGATIVE NEGATIVE Final    Comment: (NOTE) The Xpert Xpress SARS-CoV-2/FLU/RSV plus assay is intended as an aid in the  diagnosis of influenza from Nasopharyngeal swab specimens and should not be used as a sole basis for treatment. Nasal washings and aspirates are unacceptable for Xpert Xpress SARS-CoV-2/FLU/RSV testing.  Fact Sheet for  Patients: BloggerCourse.com  Fact Sheet for Healthcare Providers: SeriousBroker.it  This test is not yet approved or cleared by the [place] FDA and has been authorized for detection and/or diagnosis of SARS-CoV-2 by FDA under an Emergency Use Authorization (EUA). This EUA will remain in effect (meaning this test can be used) for the duration of the COVID-19 declaration under Section 564(b)(1) of the Act, 21 U.S.C. section 360bbb-3(b)(1), unless the authorization is terminated or revoked.     Resp Syncytial Virus by PCR NEGATIVE NEGATIVE Final    Comment: (NOTE) Fact Sheet for Patients: BloggerCourse.com  Fact Sheet for Healthcare Providers: SeriousBroker.it  This test is not yet approved or cleared by the [place] FDA and has been authorized for detection and/or diagnosis of SARS-CoV-2 by FDA under an Emergency Use Authorization (EUA). This EUA will remain in effect (meaning this test can be used) for the duration of the COVID-19 declaration under Section 564(b)(1) of the Act, 21 U.S.C. section 360bbb-3(b)(1), unless the authorization is terminated or revoked.  Performed at [facility], [address], [place], [place] [address]   MRSA Next Gen by PCR, Nasal     Status: None   Collection Time: 06/01/23  4:39 PM   Specimen: Nasal Mucosa; Nasal Swab  Result Value Ref Range Status   MRSA by PCR Next Gen NOT DETECTED NOT DETECTED Final    Comment: (NOTE) The GeneXpert MRSA Assay (FDA approved for NASAL specimens only), is one component of a comprehensive MRSA colonization surveillance program. It is not intended to diagnose MRSA  infection nor to guide or monitor treatment for MRSA infections. Test performance is not FDA approved in patients less than 16 years old. Performed at [facility], [address] [place], [place] [address]     Time coordinating discharge: 45 minutes  Signed: Melina Schools Karry Barrilleaux  [facility] 06/04/2023, 11:14 AM

## 2023-06-04 NOTE — Plan of Care (Signed)

## 2023-06-04 NOTE — Progress Notes (Signed)
Daily Progress Note   Patient Name: Matthew Rivera       Date: 06/04/2023 DOB: December 10, 1960  Age: 62 y.o. MRN#: [id number] Attending Physician: Lorin Glass, MD Primary Care Physician: [name], MD Admit Date: 06/01/2023  Reason for Consultation/Follow-up: Establishing goals of care  Subjective: Awake alert Awaiting discharge.   Length of Stay: 3  Current Medications: Scheduled Meds:   amoxicillin-clavulanate  1 tablet Oral Q12H   apixaban  10 mg Oral BID   Followed by   Melene Muller ON 06/09/2023] apixaban  5 mg Oral BID   arformoterol  15 mcg Nebulization BID   And   umeclidinium bromide  1 puff Inhalation Daily   azithromycin  500 mg Oral Daily   Chlorhexidine Gluconate Cloth  6 each Topical Daily   morphine  15 mg Oral Q12H   nicotine  14 mg Transdermal Daily   pantoprazole  40 mg Oral BID   sodium chloride flush  10-40 mL Intracatheter Q12H    Continuous Infusions:   PRN Meds: acetaminophen **OR** acetaminophen, albuterol, heparin lock flush, methocarbamol, morphine CONCENTRATE, ondansetron **OR** ondansetron (ZOFRAN) IV, mouth rinse, sodium chloride flush, tamsulosin  Physical Exam         Awake alert Regular work of breathing Has been started on home O [facility] No edema  Vital Signs: BP 97/60 (BP Location: Left Arm)   Pulse 99   Temp 98.4 F (36.9 C) (Oral)   Resp 17   Ht 5\' 11"  (1.803 m)   Wt 64 kg   SpO2 100%   BMI 19.68 kg/m  SpO2: SpO2: 100 % O2 Device: O2 Device: Nasal Cannula O2 Flow Rate: O2 Flow Rate (L/min): 3 L/min  Intake/output summary:  Intake/Output Summary (Last 24 hours) at 06/04/2023 1309 Last data filed at 06/04/2023 0645 Gross per 24 hour  Intake --  Output 1100 ml  Net -1100 ml   LBM: Last BM Date : 06/03/23 Baseline Weight: Weight:  63.6 kg Most recent weight: Weight: 64 kg       Palliative Assessment/Data:      Patient Active Problem List   Diagnosis Date Noted   Acute pulmonary embolism (HCC) 06/01/2023   Port-A-Cath in place 05/16/2023   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 male with PMH significant for previous smoking, NSCLC with metastasis, brain mets s/p craniotomy 6/30, intracranial radiation, ongoing chemotherapy   Recommendations/Plan:  Agree with current pain regimen.  Recommend follow up with outpatient palliative.     Code Status:    Code Status Orders  (From admission, onward)           Start     Ordered   06/01/23 1505  Full code  Continuous       Question:  By:  Answer:  Consent: discussion documented in EHR   06/01/23 1505           Code Status History     Date Active Date Inactive Code Status Order ID Comments User Context   05/12/2023 1226 05/13/2023 0510 Full Code [id number]  Gilmer Mor, DO HOV   04/07/2023 2346 04/09/2023 1643 Full Code [id number]  Tressie Stalker, MD Inpatient   03/31/2023 1201 04/01/2023 2130 Full Code [id number]  Vassie Loll, MD ED       Prognosis:  Unable to determine  Discharge Planning: Home with Palliative Services  Care plan was discussed with patient and wife.   Thank you for allowing the Palliative Medicine Team to assist in the care of this patient.  Mod MDM.      Greater than 50%  of this time was spent counseling and coordinating care related to the above assessment and plan.  Rosalin Hawking, MD  Please contact Palliative Medicine Team phone at [phone] for  questions and concerns.

## 2023-06-06 ENCOUNTER — Inpatient Hospital Stay: Payer: 59

## 2023-06-06 ENCOUNTER — Other Ambulatory Visit: Payer: Self-pay | Admitting: Internal Medicine

## 2023-06-06 ENCOUNTER — Inpatient Hospital Stay: Payer: 59 | Admitting: Internal Medicine

## 2023-06-06 ENCOUNTER — Telehealth: Payer: Self-pay | Admitting: Medical Oncology

## 2023-06-06 ENCOUNTER — Other Ambulatory Visit: Payer: Self-pay

## 2023-06-06 DIAGNOSIS — C3492 Malignant neoplasm of unspecified part of left bronchus or lung: Secondary | ICD-10-CM

## 2023-06-06 LAB — CULTURE, BLOOD (ROUTINE X 2)
Culture: NO GROWTH
Culture: NO GROWTH
Special Requests: ADEQUATE
Special Requests: ADEQUATE

## 2023-06-06 MED ORDER — HYDROCODONE-ACETAMINOPHEN 5-325 MG PO TABS: 1.0000 | ORAL_TABLET | Freq: Four times a day (QID) | ORAL | 0 refills | Status: DC | PRN

## 2023-06-06 NOTE — Telephone Encounter (Signed)
Pain med side effect- started MS contin 15 mg Q 12 and he does not like the way it makes him feel. Last dose was 9 am yesterday. He felt like in his head he was going to fall. No falls. He is sleeping now . He slept good last night. [employer] would not authorize Morphine liquid. I told her we will look into later . IT was strt  Pt told [name] he cannot make his appts today.  I told him I will send schedule message to R/S  I told her to call back after he wakes up today and moves around to assess his pain level.  Mohamed told Megan B. NN to tell pt to stay on hydrocodone.

## 2023-06-06 NOTE — Progress Notes (Signed)
This NN received VM from pt's wife, Matthew Rivera, to let the NN know that the pt wasn't feeling well today and wouldn't be able to make it to his appt and that it was regarding his pain medication This NN called Kim back and Matthew Rivera explained that while the pt was in the hospital over the weekend for a blood clot in his lung, the providers caring for him switched him from his hydrocodone to extended release morphine 1 tab twice daily and oral suspension IR for breakthrough. When they were discharged, their pharmacy stated that his insurance wouldn't pay for the oral suspension morphine IR, however the pharmacy was able to provide them with 12 doses of Morphine ER. Pt took a dose evening of 8/24 and a dose on morning of 8/25, and Matthew Rivera states that he "just doesn't like the way it makes him feel." And that the pt just doesn't feel well enough to come into his appt. This NN told Matthew Rivera that she would inform Dr.Mohamed of his concerns and reach back out to her soon. Kim verbalized understanding.

## 2023-06-06 NOTE — Progress Notes (Signed)
This NN spoke to Dr.Mohamed in person at approx 10:30 about pt's 02 saturations being in the mid-80s on 4L 02 and having chest pain. Dr.Mohameds recommendation was for the pt to go to the ER.  This NN called pt back at 11:01 and before being able to let [name] and the pt, who were on speaker phone, that Dr Pollie Friar suggestion was to go to the ER, that they wanted to clarify the patient's c/o pain. Pt is having generalized pain, not specific to the chest. Pt's O2 sats had risen to 96% on 4L while he was sitting at rest. [name] and the pt became upset at the prospect of having to go to the ER, stating "I don't see why this has to be complicated. If Dr.Mohamed could put something in for his pain, I think that would help" This NN asked pt for his current pain level and pt stated "9/10, maybe 10/10". The only pain medication pt has at the moment is OTC tylenol and his tramadol prescription. This NN advised pt to take a dose of his tramadol, which he did at this time. This NN reached out to Dr.Mohamed via  and Dr.Mohamed states that he will placed a Rx for hydrocodone and asked that a referral to palliative care be placed.  This NN Placed the palliative care referral at this time.  NN called pt and Kim back at 11:29 to inform them that Dr.Mohamed will place a new hydrocodone Rx for the pt and that a palliative care provider should be reaching out to them for an evaluation and management of his pain. Kim and pt verbalized understanding. Pt states he will still need something stronger than the hydrocodone, which is good for his back, but not the generalized pain he is having. This NN verbalized understanding and stated that the palliative care team can help develop a regimen that hopefully makes his pain more manageable. The pt and kim asked this NN about his new chemotherapy schedule. This NN states that it will take time to rearrange his appts, but that someone will be getting back to him soon. Kim and pt verbalized  understanding. No addittional questions at this time.

## 2023-06-06 NOTE — Progress Notes (Signed)
This NN was forwarded a VM from Dr Pollie Friar desk nurse regarding the dose of pt's hydrocodone. Pt was on norco 10/325 prior to admission to the hospital and would like that to be the dose ordered for him.  NN sent IB message to Dr Arbutus Ped requesting the dose increase.  This NN called Kim back and let her know that Dr Arbutus Ped has been made aware of the request. [name] states that, according to the pt, the tramadol helps a little bit. [name] is afraid to pick up the 5mg  tablets, have the Rx change to 10mg , then not be able to pick up the 10mg  because she just picked up the 5mg s. This NN verbalized understanding the pt's frustration. Kim and the pt will wait until morning to see if Rx has been changed before picking any medication up, and will continue to use the tramadol only as needed.

## 2023-06-06 NOTE — Progress Notes (Signed)
This NN called Kim back at this time. This NN informs [name] that she spoke to Dr.Mohamed and he approved pushing the pt's chemo and f/u appt with him until next week, and that he can start taking the hydrocodone as he was before. [name] tells this NN that the pt is out of hydrocodone. This NN encouraged Kim to reach out to the pt's primary care provider, who had been managing the pt's pain with the hydrocodone. Kim verbalized understanding. [name] states that Dr Pollie Friar desk nurse, Sedalia Muta, had also reached out to her and advised her to wake the pt up to see how he's feeling. This NN requested Kim to call this NN back after she had spoken to diane about patient's condition.

## 2023-06-07 ENCOUNTER — Telehealth: Payer: Self-pay | Admitting: Internal Medicine

## 2023-06-07 NOTE — Telephone Encounter (Signed)
Called and spoke with patient's spouse regarding upcoming September/October appointments, patient will be notified.

## 2023-06-07 NOTE — Progress Notes (Signed)
This NN reached out to pt and his wife, [name], regarding Dr.Mohameds guidance for his pain medication.Dr.Mohamed suggested the patient take 2 of the 5/325 Norco if needed for his pain control. Kim informed this navigator that they ended up picking the medication from the pharmacy on the evening of 8/26 and the pt did take 2 tablets and pt reports that it did seem to help. This NN suggested using the Norco sparingly with his tramadol and tylenol PRN, however to be cautious about how much tylenol he is using, as he should stay under 4g. Pt and Kim verbalize understanding. Message has been sent to scheduling regarding his f/u appt with Dr.Mohamed and his chemotherapy. This NN let the pt and his wife know that scheduling or this NN will reach out to him once the appts have been rescheduled. No additional questions at this time.

## 2023-06-08 ENCOUNTER — Other Ambulatory Visit: Payer: Self-pay

## 2023-06-13 NOTE — Progress Notes (Unsigned)
[facility] OFFICE PROGRESS NOTE  [name], MD [address] [place] [address]  DIAGNOSIS:  Stage IV (T2b, N2, M1b) non-small cell lung cancer, adenocarcinoma. He presented a large central left lung mass in addition to left hilar and mediastinal lymphadenopathy as well as additional lung lesion in the right lower lobe and solitary brain metastasis involving the inferior lateral right cerebellar hemisphere. He was diagnosed in June 2024.   PDL1 Expression 80%   Molecular studies: Positive for KRAS G12C mutation which can be used in the second line setting  PRIOR THERAPY: 1) status post craniotomy and surgical resection on April 07, 2023 under the care of [name]  2) Radiation to the brain cavity by Dr. Basilio Cairo.  Plan is for 3 treatments   CURRENT THERAPY: Systemic chemotherapy with carboplatin for an AUC of 5, Alimta 500 mg/m, and Keytruda 200 mg IV every 3 weeks.  First dose on 05/16/2023. Status post 1 cycle. He had significant intolerance to chemotherapy with cycle #1. Therefore, he will proceed with ***.   INTERVAL HISTORY: DEMON HOTOP 62 y.o. male returns to the clinic today for a follow up visit. The patient was recently diagnosed with stage IV lung cancer. He has significant fatigue/weakness. He is status post his first cycle of treatment and had intolerance with nausea, vomiting, fatigue, weakness, poor appetite and weight loss. He had come to the clinic in the interval to recieves IVF. On one of the occasions while in the clinic receiving IVF, his pulse was 130, oxygen 80% and had worsening shortness of breath.   He was subsequently sent to the ER where he was admitted from 8/21-8/24 and treated for multifocal pneumonia and PE. He is currently on eliquis and was treated for pneumonia. He was also discharged with supplemental oxygen.   He also has been having pain with ***. He was discharged with MS contin 15 mg BID and morphine sublingual. He was  referred to outpatient palliative care and is scheduled to see them on 06/20/23.   He called in the interval with intolerance to morphine staying it makes him feel like he is going to fall. His pain had previously been managed by is PCP with norco.   Called on 8.26 c/o CP and hypoxia. Advised to go to Er. Told to resume his tramadol.   Today, he is feeling ***. He denies any fevers, chills, or night sweats. He had lost a lot of weight. He was previously referred to a member of the nutritionist team and has an appointment on ***. Boost/ensure ***. His breathing is ***. Supplemental oxygen ***. Cough ***. Denies hemoptysis. Nausea/vomiting/Diarrhea/constipation. Rashes or skin changes? He is here for evaluation and repeat blood work before considering cycle #2.     He reports at baseline he has struggled with a sensitive stomach for several years.  He is on Protonix and takes Pepcid. He also mentions his voice is hoarse.  He was previously referred to nutrition.   MEDICAL HISTORY: Past Medical History:  Diagnosis Date   Dyslipidemia    GERD (gastroesophageal reflux disease)    Lung nodule seen on imaging study 03/31/2023   seen on CT results   Osteoarthritis    Smoker    long term smoker 1 ppd    ALLERGIES:  has No Known Allergies.  MEDICATIONS:  Current Outpatient Medications  Medication Sig Dispense Refill   albuterol (VENTOLIN HFA) 108 (90 Base) MCG/ACT inhaler Inhale 2 puffs into the lungs every 4 (  four) hours as needed for wheezing or shortness of breath.     [START ON 07/01/2023] apixaban (ELIQUIS) 5 MG TABS tablet Take 1 tablet (5 mg total) by mouth 2 (two) times daily. This prescription will not start until 9/20 so can place on hold 60 tablet 2   APIXABAN (ELIQUIS) VTE STARTER PACK (10MG  AND 5MG ) Take as directed on package: start with two-5mg  tablets twice daily for 7 days. On day 8, switch to one-5mg  tablet twice daily. 74 each 0   docusate sodium (COLACE) 100 MG capsule Take 100  mg by mouth in the morning.     folic acid (FOLVITE) 1 MG tablet Take 1 tablet (1 mg total) by mouth daily. 30 tablet 2   HYDROcodone-acetaminophen (NORCO) 5-325 MG tablet Take 1 tablet by mouth every 6 (six) hours as needed for moderate pain. 30 tablet 0   lidocaine-prilocaine (EMLA) cream Apply 1 Application topically as needed. (Patient taking differently: Apply 1 Application topically as needed (for port access- every 21 days).) 30 g 2   LORazepam (ATIVAN) 0.5 MG tablet Take 1 tablet (0.5 mg total) by mouth as needed for anxiety. Take 1-2 tablet by mouth 20 minutes before MRI or radiation treatment. (Patient taking differently: Take 0.5-1 mg by mouth See admin instructions. Take 0.5-1 mg by mouth 20 minutes before MRI or radiation treatment.) 12 tablet 0   Naphazoline-Pheniramine (VISINE OP) Place 1 drop into both eyes daily as needed (dry eyes).     nicotine (NICODERM CQ - DOSED IN MG/24 HOURS) 14 mg/24hr patch Place 1 patch (14 mg total) onto the skin daily. 28 patch 0   ondansetron (ZOFRAN-ODT) 4 MG disintegrating tablet Take 1 tablet (4 mg total) by mouth every 8 (eight) hours as needed for nausea or vomiting. (Patient taking differently: Take 4 mg by mouth every 8 (eight) hours as needed for nausea or vomiting (DISSOLVE ORALLY).) 30 tablet 2   pantoprazole (PROTONIX) 40 MG tablet Take 1 tablet (40 mg total) by mouth 2 (two) times daily. (Patient taking differently: Take 40 mg by mouth daily before breakfast.) 60 tablet 2   prochlorperazine (COMPAZINE) 10 MG tablet Take 1 tablet (10 mg total) by mouth every 6 (six) hours as needed. (Patient taking differently: Take 10 mg by mouth every 6 (six) hours as needed for nausea or vomiting.) 30 tablet 2   tamsulosin (FLOMAX) 0.4 MG CAPS capsule Take 0.4 mg by mouth at bedtime as needed (urinary flow issues).     Tiotropium Bromide-Olodaterol (STIOLTO RESPIMAT) 2.5-2.5 MCG/ACT AERS Inhale 2 puffs into the lungs daily. 4 g 11   zolpidem (AMBIEN) 10 MG  tablet Take 10 mg by mouth at bedtime as needed for sleep.     No current facility-administered medications for this visit.    SURGICAL HISTORY:  Past Surgical History:  Procedure Laterality Date   BACK SURGERY     x 1   COLONOSCOPY WITH PROPOFOL N/A 08/18/2020   Procedure: COLONOSCOPY WITH PROPOFOL;  Surgeon: Lanelle Bal, DO;  Location: [facility];  Service: Endoscopy;  Laterality: N/A;  7:30am   CRANIOTOMY Right 04/07/2023   Procedure: Right suboccipital craniotomy for tumor resection;  Surgeon: Tressie Stalker, MD;  Location: [facility] OR;  Service: Neurosurgery;  Laterality: Right;  TO follow   IR IMAGING GUIDED PORT INSERTION  05/12/2023   MULTIPLE TOOTH EXTRACTIONS     wears full dentures   POLYPECTOMY  08/18/2020   Procedure: POLYPECTOMY INTESTINAL;  Surgeon: Lanelle Bal, DO;  Location:  [facility];  Service: Endoscopy;;    REVIEW OF SYSTEMS:   Review of Systems  Constitutional: Negative for appetite change, chills, fatigue, fever and unexpected weight change.  HENT:   Negative for mouth sores, nosebleeds, sore throat and trouble swallowing.   Eyes: Negative for eye problems and icterus.  Respiratory: Negative for cough, hemoptysis, shortness of breath and wheezing.   Cardiovascular: Negative for chest pain and leg swelling.  Gastrointestinal: Negative for abdominal pain, constipation, diarrhea, nausea and vomiting.  Genitourinary: Negative for bladder incontinence, difficulty urinating, dysuria, frequency and hematuria.   Musculoskeletal: Negative for back pain, gait problem, neck pain and neck stiffness.  Skin: Negative for itching and rash.  Neurological: Negative for dizziness, extremity weakness, gait problem, headaches, light-headedness and seizures.  Hematological: Negative for adenopathy. Does not bruise/bleed easily.  Psychiatric/Behavioral: Negative for confusion, depression and sleep disturbance. The patient is not nervous/anxious.     PHYSICAL  EXAMINATION:  There were no vitals taken for this visit.  ECOG PERFORMANCE STATUS: {CHL ONC ECOG [id number]  Physical Exam  Constitutional: Oriented to person, place, and time and well-developed, well-nourished, and in no distress. No distress.  HENT:  Head: Normocephalic and atraumatic.  Mouth/Throat: Oropharynx is clear and moist. No oropharyngeal exudate.  Eyes: Conjunctivae are normal. Right eye exhibits no discharge. Left eye exhibits no discharge. No scleral icterus.  Neck: Normal range of motion. Neck supple.  Cardiovascular: Normal rate, regular rhythm, normal heart sounds and intact distal pulses.   Pulmonary/Chest: Effort normal and breath sounds normal. No respiratory distress. No wheezes. No rales.  Abdominal: Soft. Bowel sounds are normal. Exhibits no distension and no mass. There is no tenderness.  Musculoskeletal: Normal range of motion. Exhibits no edema.  Lymphadenopathy:    No cervical adenopathy.  Neurological: Alert and oriented to person, place, and time. Exhibits normal muscle tone. Gait normal. Coordination normal.  Skin: Skin is warm and dry. No rash noted. Not diaphoretic. No erythema. No pallor.  Psychiatric: Mood, memory and judgment normal.  Vitals reviewed.  LABORATORY DATA: Lab Results  Component Value Date   WBC 16.6 (H) 06/03/2023   HGB 8.5 (L) 06/03/2023   HCT 25.9 (L) 06/03/2023   MCV 91.8 06/03/2023   PLT 144 (L) 06/03/2023      Chemistry      Component Value Date/Time   NA 132 (L) 06/03/2023 0511   K 3.5 06/03/2023 0511   CL 101 06/03/2023 0511   CO2 24 06/03/2023 0511   BUN 20 06/03/2023 0511   CREATININE 0.82 06/03/2023 0511   CREATININE 1.28 (H) 05/30/2023 1356      Component Value Date/Time   CALCIUM 7.3 (L) 06/03/2023 0511   ALKPHOS 318 (H) 06/03/2023 0511   AST 34 06/03/2023 0511   AST 28 05/30/2023 1356   ALT 26 06/03/2023 0511   ALT 28 05/30/2023 1356   BILITOT 1.0 06/03/2023 0511   BILITOT 1.5 (H) 05/30/2023 1356        RADIOGRAPHIC STUDIES:  VAS [place] LOWER EXTREMITY VENOUS (DVT)  Result Date: 06/01/2023  Lower Venous DVT Study Patient Name:  HUDIE PICKEN  Date of Exam:   06/01/2023 Medical Rec #: [id number]     Accession #:    [id number] Date of Birth: Jul 14, 1961      Patient Gender: M Patient Age:   44 years Exam Location:  [facility] Procedure:      VAS [place] LOWER EXTREMITY VENOUS (DVT) Referring Phys: [facility] --------------------------------------------------------------------------------  Indications: Pulmonary embolism.  Risk Factors: Confirmed PE Cancer. Comparison Study: No prior studies. Performing Technologist: Chanda Busing RVT  Examination Guidelines: A complete evaluation includes B-mode imaging, spectral Doppler, color Doppler, and power Doppler as needed of all accessible portions of each vessel. Bilateral testing is considered an integral part of a complete examination. Limited examinations for reoccurring indications may be performed as noted. The reflux portion of the exam is performed with the patient in reverse Trendelenburg.  +-----------------+---------------+---------+-----------+----------+----------+ RIGHT            CompressibilityPhasicitySpontaneityPropertiesThrombus                                                                 Aging      +-----------------+---------------+---------+-----------+----------+----------+ CFV              Full           Yes      Yes                             +-----------------+---------------+---------+-----------+----------+----------+ SFJ              Full                                                    +-----------------+---------------+---------+-----------+----------+----------+ FV Prox          Full                                                    +-----------------+---------------+---------+-----------+----------+----------+ FV Mid           Full                                                     +-----------------+---------------+---------+-----------+----------+----------+ FV Distal        Full                                                    +-----------------+---------------+---------+-----------+----------+----------+ PFV              Full                                                    +-----------------+---------------+---------+-----------+----------+----------+ POP              None           No       No                   Acute      +-----------------+---------------+---------+-----------+----------+----------+  PTV              Full                                                    +-----------------+---------------+---------+-----------+----------+----------+ PERO             Full                                                    +-----------------+---------------+---------+-----------+----------+----------+ Gastroc          Partial                                      Acute      +-----------------+---------------+---------+-----------+----------+----------+ Tibioperoneal    Partial                                      Acute      trunk                                                                    +-----------------+---------------+---------+-----------+----------+----------+   +---------+---------------+---------+-----------+----------+--------------+ LEFT     CompressibilityPhasicitySpontaneityPropertiesThrombus Aging +---------+---------------+---------+-----------+----------+--------------+ CFV      Full           Yes      Yes                                 +---------+---------------+---------+-----------+----------+--------------+ SFJ      Full                                                        +---------+---------------+---------+-----------+----------+--------------+ FV Prox  Full                                                         +---------+---------------+---------+-----------+----------+--------------+ FV Mid   Full                                                        +---------+---------------+---------+-----------+----------+--------------+ FV DistalFull                                                        +---------+---------------+---------+-----------+----------+--------------+  PFV      Full                                                        +---------+---------------+---------+-----------+----------+--------------+ POP      None           No       No                   Acute          +---------+---------------+---------+-----------+----------+--------------+ PTV      Full                                                        +---------+---------------+---------+-----------+----------+--------------+ PERO     Full                                                        +---------+---------------+---------+-----------+----------+--------------+ Soleal   None                                         Acute          +---------+---------------+---------+-----------+----------+--------------+ Gastroc  None                                         Acute          +---------+---------------+---------+-----------+----------+--------------+     Summary: RIGHT: - Findings consistent with acute deep vein thrombosis involving the right popliteal vein, tibioperoneal trunk, and right gastrocnemius veins. - No cystic structure found in the popliteal fossa.  LEFT: - Findings consistent with acute deep vein thrombosis involving the left popliteal vein, left soleal veins, and left gastrocnemius veins. - No cystic structure found in the popliteal fossa.  *See table(s) above for measurements and observations. Electronically signed by Heath Lark on 06/01/2023 at 8:50:30 PM.    Final    CT Angio Chest PE W and/or Wo Contrast  Result Date: 06/01/2023 CLINICAL DATA:  History of left lower lobe  non-small-cell lung cancer with shortness of breath, hypoxia, and tachycardia EXAM: CT ANGIOGRAPHY CHEST WITH CONTRAST TECHNIQUE: Multidetector CT imaging of the chest was performed using the standard protocol during bolus administration of intravenous contrast. Multiplanar CT image reconstructions and MIPs were obtained to evaluate the vascular anatomy. RADIATION DOSE REDUCTION: This exam was performed according to the departmental dose-optimization program which includes automated exposure control, adjustment of the mA and/or kV according to patient size and/or use of iterative reconstruction technique. CONTRAST:  80mL OMNIPAQUE IOHEXOL 350 MG/ML SOLN COMPARISON:  CT chest dated 03/31/2023, nuclear medicine PET dated 04/29/2023 FINDINGS: Cardiovascular: Right chest wall port terminates in the lower SVC. The study is high quality for the evaluation of pulmonary embolism. Filling defect within right upper lobe anterior segmental artery. Great vessels are normal in course  and caliber. Normal heart size. No significant pericardial fluid/thickening. Coronary artery calcifications and aortic atherosclerosis. Mediastinum/Nodes: Imaged thyroid gland without nodules meeting criteria for imaging follow-up by size. Normal esophagus. No substantial change in size of subcarinal 1.7 cm lymph node (5:85). New multi station lymphadenopathy, for example 1.3 cm AP window (5:64), 1.0 cm left hilar (5:84), and 1.1 cm right hilar (5:85). Lungs/Pleura: The central airways are patent. Lower lobe predominant peripheral ground-glass opacities and irregular consolidations throughout all lobes. New multifocal thick-walled cavitary lesions, for example central left upper lobe measuring 1.9 x 1.2 cm (13:57) no substantial change in size of dominant basilar left lower lobe irregular mass measuring 4.6 x 2.1 cm (remeasured, 13:116). Slightly decreased size of subpleural right lower lobe nodule measuring 2.2 x 1.3 cm (13:122), previously 2.4 x  1.5 cm. No pneumothorax. No pleural effusion. Upper abdomen: Unchanged enlarged spleen, 13.8 cm in AP dimension. Cholelithiasis. Musculoskeletal: No acute or abnormal lytic or blastic osseous lesions. Review of the MIP images confirms the above findings. IMPRESSION: 1. Filling defect within right upper lobe anterior segmental artery, consistent with pulmonary embolism. No evidence of right heart strain. 2. Lower lobe predominant peripheral ground-glass opacities and irregular consolidations throughout all lobes, with new multifocal thick-walled cavitary lesions. Findings are most consistent with multifocal pneumonia with suspicion for concurrent septic emboli. 3. No substantial change in size of dominant basilar left lower lobe irregular mass measuring 4.6 x 2.1 cm. Slightly decreased size of subpleural right lower lobe nodule measuring 2.2 x 1.3 cm. 4. New multi station lymphadenopathy, likely reactive. 5. Unchanged splenomegaly. 6. Aortic Atherosclerosis (ICD10-I70.0). Coronary artery calcifications. Assessment for potential risk factor modification, dietary therapy or pharmacologic therapy may be warranted, if clinically indicated. Critical Value/emergent results were called by telephone at the time of interpretation on 06/01/2023 at 2:06 pm to provider [facility] , who verbally acknowledged these results. Electronically Signed   By: Agustin Cree M.D.   On: 06/01/2023 14:13   DG Chest Port 1 View  Result Date: 06/01/2023 CLINICAL DATA:  Questionable sepsis.  Chest soreness EXAM: PORTABLE CHEST 1 VIEW COMPARISON:  X-ray 01/15/2011.  PET-CT 04/29/2023 FINDINGS: No pneumothorax or effusion. Normal cardiopericardial silhouette. No edema. Overlapping cardiac leads. Right IJ chest port with tip overlying the central SVC. The port is accessed multifocal consolidative opacities identified greatest in the medial right lung base also more peripherally in the right midlung and towards the lingula. Patient does have a  history of lung cancer IMPRESSION: Chest port. Multifocal parenchymal opacities along the lower lungs. Possible acute infiltrate. Recommend follow-up. Please correlate with the history of known lung cancer in the prior PET CT of 04/29/2023. Recommend follow up Electronically Signed   By: Karen Kays M.D.   On: 06/01/2023 09:53     ASSESSMENT/PLAN:  This is a very pleasant 62 year old male with stage IV (T2b, N2, M1b) non-small cell lung cancer, adenocarcinoma. He presented a large central left lung mass in addition to left hilar and mediastinal lymphadenopathy as well as additional lung lesion in the right lower lobe and solitary brain metastasis involving the inferior lateral right cerebellar hemisphere. He was diagnosed in June 2024.    His PD-L1 expression is 80%.  His molecular studies by [facility] 1 shows K-ras G12 C mutation which can be used in the second line setting  The patient underwent craniotomy and surgical resection on 04/07/2023 by [name].   He subsequently had SRS to the brain cavity by Dr. Basilio Cairo  He is currently  undergoing palliative systemic chemotherapy with carboplatin for an AUC of 5 on day 1, Alimta 500 mg/m2 and keytruda 200 mg/m2 IV every 3 weeks. He is status post 1 cycle and had intolerance to treatment.   The patient was seen with Dr. Arbutus Ped today. Labs were reviewed. Given his PDL1 expression of 80%, we will discontinue his chemotherapy since he has poor tolerance to chemotherapy and has a lot of ongoing fatigue, weakness, and failure to thrive.   He will *** with cycle #2 today ***scheduled.   We will see him back in *** weeks with ***.   Nutrition ***  He is schedule to see palliative care   The patient was advised to call immediately if he has any concerning symptoms in the interval. The patient voices understanding of current disease status and treatment options and is in agreement with the current care plan. All questions were answered. The patient  knows to call the clinic with any problems, questions or concerns. We can certainly see the patient much sooner if necessary     No orders of the defined types were placed in this encounter.    I spent {CHL ONC TIME VISIT - [id number]} counseling the patient face to face. The total time spent in the appointment was {CHL ONC TIME VISIT - [id number]:[id number]}.  Aarvi Stotts L Merrick Feutz, PA-C 06/13/23

## 2023-06-14 ENCOUNTER — Other Ambulatory Visit: Payer: 59

## 2023-06-14 ENCOUNTER — Encounter: Payer: 59 | Admitting: Dietician

## 2023-06-15 ENCOUNTER — Inpatient Hospital Stay: Payer: 59

## 2023-06-15 ENCOUNTER — Inpatient Hospital Stay (HOSPITAL_BASED_OUTPATIENT_CLINIC_OR_DEPARTMENT_OTHER): Payer: 59 | Admitting: Physician Assistant

## 2023-06-15 ENCOUNTER — Encounter: Payer: Self-pay | Admitting: Internal Medicine

## 2023-06-15 ENCOUNTER — Inpatient Hospital Stay: Payer: 59 | Attending: Internal Medicine

## 2023-06-15 VITALS — BP 106/84 | HR 102 | Temp 97.5°F | Resp 17 | Wt 133.7 lb

## 2023-06-15 VITALS — HR 98

## 2023-06-15 DIAGNOSIS — C7931 Secondary malignant neoplasm of brain: Secondary | ICD-10-CM | POA: Diagnosis not present

## 2023-06-15 DIAGNOSIS — Z79899 Other long term (current) drug therapy: Secondary | ICD-10-CM | POA: Diagnosis not present

## 2023-06-15 DIAGNOSIS — Z5111 Encounter for antineoplastic chemotherapy: Secondary | ICD-10-CM | POA: Insufficient documentation

## 2023-06-15 DIAGNOSIS — C3492 Malignant neoplasm of unspecified part of left bronchus or lung: Secondary | ICD-10-CM

## 2023-06-15 DIAGNOSIS — C3490 Malignant neoplasm of unspecified part of unspecified bronchus or lung: Secondary | ICD-10-CM | POA: Diagnosis present

## 2023-06-15 DIAGNOSIS — D649 Anemia, unspecified: Secondary | ICD-10-CM | POA: Insufficient documentation

## 2023-06-15 DIAGNOSIS — Z5112 Encounter for antineoplastic immunotherapy: Secondary | ICD-10-CM | POA: Insufficient documentation

## 2023-06-15 DIAGNOSIS — C771 Secondary and unspecified malignant neoplasm of intrathoracic lymph nodes: Secondary | ICD-10-CM | POA: Diagnosis not present

## 2023-06-15 DIAGNOSIS — Z95828 Presence of other vascular implants and grafts: Secondary | ICD-10-CM

## 2023-06-15 LAB — CBC WITH DIFFERENTIAL (CANCER CENTER ONLY)
Abs Immature Granulocytes: 0.43 10*3/uL — ABNORMAL HIGH (ref 0.00–0.07)
Basophils Absolute: 0.1 10*3/uL (ref 0.0–0.1)
Basophils Relative: 1 %
Eosinophils Absolute: 0.2 10*3/uL (ref 0.0–0.5)
Eosinophils Relative: 2 %
HCT: 26.3 % — ABNORMAL LOW (ref 39.0–52.0)
Hemoglobin: 8.5 g/dL — ABNORMAL LOW (ref 13.0–17.0)
Immature Granulocytes: 4 %
Lymphocytes Relative: 9 %
Lymphs Abs: 0.9 10*3/uL (ref 0.7–4.0)
MCH: 30.1 pg (ref 26.0–34.0)
MCHC: 32.3 g/dL (ref 30.0–36.0)
MCV: 93.3 fL (ref 80.0–100.0)
Monocytes Absolute: 0.8 10*3/uL (ref 0.1–1.0)
Monocytes Relative: 8 %
Neutro Abs: 7.3 10*3/uL (ref 1.7–7.7)
Neutrophils Relative %: 76 %
Platelet Count: 369 10*3/uL (ref 150–400)
RBC: 2.82 MIL/uL — ABNORMAL LOW (ref 4.22–5.81)
RDW: 19.7 % — ABNORMAL HIGH (ref 11.5–15.5)
WBC Count: 9.7 10*3/uL (ref 4.0–10.5)
nRBC: 0.2 % (ref 0.0–0.2)

## 2023-06-15 LAB — CMP (CANCER CENTER ONLY)
ALT: 14 U/L (ref 0–44)
AST: 21 U/L (ref 15–41)
Albumin: 2.5 g/dL — ABNORMAL LOW (ref 3.5–5.0)
Alkaline Phosphatase: 162 U/L — ABNORMAL HIGH (ref 38–126)
Anion gap: 5 (ref 5–15)
BUN: 11 mg/dL (ref 8–23)
CO2: 29 mmol/L (ref 22–32)
Calcium: 8.3 mg/dL — ABNORMAL LOW (ref 8.9–10.3)
Chloride: 103 mmol/L (ref 98–111)
Creatinine: 0.79 mg/dL (ref 0.61–1.24)
GFR, Estimated: >60 mL/min (ref >60)
Glucose, Bld: 91 mg/dL (ref 70–99)
Potassium: 4.1 mmol/L (ref 3.5–5.1)
Sodium: 137 mmol/L (ref 135–145)
Total Bilirubin: 0.5 mg/dL (ref 0.3–1.2)
Total Protein: 6.4 g/dL — ABNORMAL LOW (ref 6.5–8.1)

## 2023-06-15 MED ORDER — SODIUM CHLORIDE 0.9 % IV SOLN
200.0000 mg | Freq: Once | INTRAVENOUS | Status: AC
  Administered 2023-06-15: 200 mg via INTRAVENOUS
  Filled 2023-06-15: qty 200

## 2023-06-15 MED ORDER — SODIUM CHLORIDE 0.9 % IV SOLN
400.0000 mg/m2 | Freq: Once | INTRAVENOUS | Status: AC
  Administered 2023-06-15: 700 mg via INTRAVENOUS
  Filled 2023-06-15: qty 20

## 2023-06-15 MED ORDER — SODIUM CHLORIDE 0.9% FLUSH
10.0000 mL | INTRAVENOUS | Status: DC | PRN
  Administered 2023-06-15: 10 mL

## 2023-06-15 MED ORDER — SODIUM CHLORIDE 0.9 % IV SOLN
430.0000 mg | Freq: Once | INTRAVENOUS | Status: AC
  Administered 2023-06-15: 430 mg via INTRAVENOUS
  Filled 2023-06-15: qty 43

## 2023-06-15 MED ORDER — PALONOSETRON HCL INJECTION 0.25 MG/5ML
0.2500 mg | Freq: Once | INTRAVENOUS | Status: AC
  Administered 2023-06-15: 0.25 mg via INTRAVENOUS
  Filled 2023-06-15: qty 5

## 2023-06-15 MED ORDER — SODIUM CHLORIDE 0.9 % IV SOLN
150.0000 mg | Freq: Once | INTRAVENOUS | Status: AC
  Administered 2023-06-15: 150 mg via INTRAVENOUS
  Filled 2023-06-15: qty 150

## 2023-06-15 MED ORDER — HEPARIN SOD (PORK) LOCK FLUSH 100 UNIT/ML IV SOLN
500.0000 [IU] | Freq: Once | INTRAVENOUS | Status: AC | PRN
  Administered 2023-06-15: 500 [IU]

## 2023-06-15 MED ORDER — PROCHLORPERAZINE MALEATE 10 MG PO TABS: 10.0000 mg | ORAL_TABLET | Freq: Four times a day (QID) | ORAL | 2 refills | Status: DC | PRN

## 2023-06-15 MED ORDER — SODIUM CHLORIDE 0.9 % IV SOLN: Freq: Once | INTRAVENOUS | Status: AC

## 2023-06-15 MED ORDER — SODIUM CHLORIDE 0.9 % IV SOLN
10.0000 mg | Freq: Once | INTRAVENOUS | Status: AC
  Administered 2023-06-15: 10 mg via INTRAVENOUS
  Filled 2023-06-15: qty 10

## 2023-06-15 NOTE — Patient Instructions (Signed)
[place] [facility]  Discharge Instructions: Thank you for choosing [facility] to provide your oncology and hematology care.   If you have a lab appointment with the Cancer Center, please go directly to the Cancer Center and check in at the registration area.   Wear comfortable clothing and clothing appropriate for easy access to any Portacath or PICC line.   We strive to give you quality time with your provider. You may need to reschedule your appointment if you arrive late (15 or more minutes).  Arriving late affects you and other patients whose appointments are after yours.  Also, if you miss three or more appointments without notifying the office, you may be dismissed from the clinic at the provider's discretion.      For prescription refill requests, have your pharmacy contact our office and allow 72 hours for refills to be completed.    Today you received the following chemotherapy and/or immunotherapy agents Keytruda, Alimta, Carbo      To help prevent nausea and vomiting after your treatment, we encourage you to take your nausea medication as directed.  BELOW ARE SYMPTOMS THAT SHOULD BE REPORTED IMMEDIATELY: *FEVER GREATER THAN 100.4 F (38 C) OR HIGHER *CHILLS OR SWEATING *NAUSEA AND VOMITING THAT IS NOT CONTROLLED WITH YOUR NAUSEA MEDICATION *UNUSUAL SHORTNESS OF BREATH *UNUSUAL BRUISING OR BLEEDING *URINARY PROBLEMS (pain or burning when urinating, or frequent urination) *BOWEL PROBLEMS (unusual diarrhea, constipation, pain near the anus) TENDERNESS IN MOUTH AND THROAT WITH OR WITHOUT PRESENCE OF ULCERS (sore throat, sores in mouth, or a toothache) UNUSUAL RASH, SWELLING OR PAIN  UNUSUAL VAGINAL DISCHARGE OR ITCHING   Items with * indicate a potential emergency and should be followed up as soon as possible or go to the Emergency Department if any problems should occur.  Please show the CHEMOTHERAPY ALERT CARD or IMMUNOTHERAPY ALERT  CARD at check-in to the Emergency Department and triage nurse.  Should you have questions after your visit or need to cancel or reschedule your appointment, please contact [place] [facility]  Dept: [phone]  and follow the prompts.  Office hours are 8:00 a.m. to 4:30 p.m. Monday - Friday. Please note that voicemails left after 4:00 p.m. may not be returned until the following business day.  We are closed weekends and major holidays. You have access to a nurse at all times for urgent questions. Please call the main number to the clinic Dept: [phone] and follow the prompts.   For any non-urgent questions, you may also contact your provider using MyChart. We now offer e-Visits for anyone 58 and older to request care online for non-urgent symptoms. For details visit [email or web address].   Also download the MyChart app! Go to the app store, search "MyChart", open the app, select [place], and log in with your MyChart username and password.

## 2023-06-15 NOTE — Progress Notes (Deleted)
Palliative Medicine [facility]  Telephone:([phone]) [phone] Fax:(336) [phone]   Name: Matthew Rivera Date: 06/15/2023 MRN: [id number]  DOB: [birth year]  Patient Care Team: [name], MD as PCP - General (Internal Medicine) Lanelle Bal, DO as Consulting Physician (Internal Medicine)    REASON FOR CONSULTATION: Matthew Rivera is a 62 y.o. male with oncologic medical history including non-small cell lung adenocarcinoma (/2024) with metastatic disease to the brain, status post craniotomy (03/2023).  Palliative ask to see for symptom management and goals of care.    SOCIAL HISTORY:     reports that he has been smoking cigarettes. He started smoking about 52 years ago. He has a 78.3 pack-year smoking history. He has never used smokeless tobacco. He reports that he does not currently use alcohol. He reports that he does not use drugs.  ADVANCE DIRECTIVES:  None on file  CODE STATUS: Full code  PAST MEDICAL HISTORY: Past Medical History:  Diagnosis Date   Dyslipidemia    GERD (gastroesophageal reflux disease)    Lung nodule seen on imaging study 03/31/2023   seen on CT results   Osteoarthritis    Smoker    long term smoker 1 ppd    PAST SURGICAL HISTORY:  Past Surgical History:  Procedure Laterality Date   BACK SURGERY     x 1   COLONOSCOPY WITH PROPOFOL N/A 08/18/2020   Procedure: COLONOSCOPY WITH PROPOFOL;  Surgeon: Lanelle Bal, DO;  Location: [facility];  Service: Endoscopy;  Laterality: N/A;  7:30am   CRANIOTOMY Right 04/07/2023   Procedure: Right suboccipital craniotomy for tumor resection;  Surgeon: Tressie Stalker, MD;  Location: [facility] OR;  Service: Neurosurgery;  Laterality: Right;  TO follow   IR IMAGING GUIDED PORT INSERTION  05/12/2023   MULTIPLE TOOTH EXTRACTIONS     wears full dentures   POLYPECTOMY  08/18/2020   Procedure: POLYPECTOMY INTESTINAL;  Surgeon: Lanelle Bal, DO;  Location: [facility];  Service: Endoscopy;;     HEMATOLOGY/ONCOLOGY HISTORY:  Oncology History  Adenosquamous carcinoma of left lung (HCC)  04/20/2023 Initial Diagnosis   Adenosquamous carcinoma of left lung (HCC)   04/20/2023 Cancer Staging   Staging form: Lung, AJCC 8th Edition - Clinical: Stage IVA (cT2b, cN2, cM1b) - Signed by Si Gaul, MD on 04/20/2023   05/16/2023 -  Chemotherapy   Patient is on Treatment Plan : LUNG Carboplatin (5) + Pemetrexed (500) + Pembrolizumab (200) D1 q21d Induction x 4 cycles / Maintenance Pemetrexed (500) + Pembrolizumab (200) D1 q21d       ALLERGIES:  has No Known Allergies.  MEDICATIONS:  Current Outpatient Medications  Medication Sig Dispense Refill   albuterol (VENTOLIN HFA) 108 (90 Base) MCG/ACT inhaler Inhale 2 puffs into the lungs every 4 (four) hours as needed for wheezing or shortness of breath.     [START ON 07/01/2023] apixaban (ELIQUIS) 5 MG TABS tablet Take 1 tablet (5 mg total) by mouth 2 (two) times daily. This prescription will not start until 9/20 so can place on hold 60 tablet 2   APIXABAN (ELIQUIS) VTE STARTER PACK (10MG  AND 5MG ) Take as directed on package: start with two-5mg  tablets twice daily for 7 days. On day 8, switch to one-5mg  tablet twice daily. 74 each 0   docusate sodium (COLACE) 100 MG capsule Take 100 mg by mouth in the morning.     folic acid (FOLVITE) 1 MG tablet Take 1 tablet (1 mg total) by mouth daily.  30 tablet 2   HYDROcodone-acetaminophen (NORCO) 5-325 MG tablet Take 1 tablet by mouth every 6 (six) hours as needed for moderate pain. 30 tablet 0   lidocaine-prilocaine (EMLA) cream Apply 1 Application topically as needed. (Patient taking differently: Apply 1 Application topically as needed (for port access- every 21 days).) 30 g 2   LORazepam (ATIVAN) 0.5 MG tablet Take 1 tablet (0.5 mg total) by mouth as needed for anxiety. Take 1-2 tablet by mouth 20 minutes before MRI or radiation treatment. (Patient taking differently: Take 0.5-1 mg by mouth See admin  instructions. Take 0.5-1 mg by mouth 20 minutes before MRI or radiation treatment.) 12 tablet 0   Naphazoline-Pheniramine (VISINE OP) Place 1 drop into both eyes daily as needed (dry eyes).     nicotine (NICODERM CQ - DOSED IN MG/24 HOURS) 14 mg/24hr patch Place 1 patch (14 mg total) onto the skin daily. 28 patch 0   ondansetron (ZOFRAN-ODT) 4 MG disintegrating tablet Take 1 tablet (4 mg total) by mouth every 8 (eight) hours as needed for nausea or vomiting. (Patient taking differently: Take 4 mg by mouth every 8 (eight) hours as needed for nausea or vomiting (DISSOLVE ORALLY).) 30 tablet 2   pantoprazole (PROTONIX) 40 MG tablet Take 1 tablet (40 mg total) by mouth 2 (two) times daily. (Patient taking differently: Take 40 mg by mouth daily before breakfast.) 60 tablet 2   prochlorperazine (COMPAZINE) 10 MG tablet Take 1 tablet (10 mg total) by mouth every 6 (six) hours as needed. (Patient taking differently: Take 10 mg by mouth every 6 (six) hours as needed for nausea or vomiting.) 30 tablet 2   tamsulosin (FLOMAX) 0.4 MG CAPS capsule Take 0.4 mg by mouth at bedtime as needed (urinary flow issues).     Tiotropium Bromide-Olodaterol (STIOLTO RESPIMAT) 2.5-2.5 MCG/ACT AERS Inhale 2 puffs into the lungs daily. 4 g 11   zolpidem (AMBIEN) 10 MG tablet Take 10 mg by mouth at bedtime as needed for sleep.     No current facility-administered medications for this visit.    VITAL SIGNS: There were no vitals taken for this visit. There were no vitals filed for this visit.  Estimated body mass index is 18.65 kg/m as calculated from the following:   Height as of 06/01/23: 5\' 11"  (1.803 m).   Weight as of 06/15/23: 133 lb 11.2 oz (60.6 kg).  LABS: CBC:    Component Value Date/Time   WBC 9.7 06/15/2023 1111   WBC 16.6 (H) 06/03/2023 0511   HGB 8.5 (L) 06/15/2023 1111   HCT 26.3 (L) 06/15/2023 1111   PLT 369 06/15/2023 1111   MCV 93.3 06/15/2023 1111   NEUTROABS 7.3 06/15/2023 1111   LYMPHSABS 0.9  06/15/2023 1111   MONOABS 0.8 06/15/2023 1111   EOSABS 0.2 06/15/2023 1111   BASOSABS 0.1 06/15/2023 1111   Comprehensive Metabolic Panel:    Component Value Date/Time   NA 132 (L) 06/03/2023 0511   K 3.5 06/03/2023 0511   CL 101 06/03/2023 0511   CO2 24 06/03/2023 0511   BUN 20 06/03/2023 0511   CREATININE 0.82 06/03/2023 0511   CREATININE 1.28 (H) 05/30/2023 1356   GLUCOSE 89 06/03/2023 0511   CALCIUM 7.3 (L) 06/03/2023 0511   AST 34 06/03/2023 0511   AST 28 05/30/2023 1356   ALT 26 06/03/2023 0511   ALT 28 05/30/2023 1356   ALKPHOS 318 (H) 06/03/2023 0511   BILITOT 1.0 06/03/2023 0511   BILITOT 1.5 (H) 05/30/2023 1356  PROT 5.2 (L) 06/03/2023 0511   ALBUMIN 1.5 (L) 06/03/2023 0511    RADIOGRAPHIC STUDIES: CT Angio Chest PE W and/or Wo Contrast  Result Date: 06/01/2023 CLINICAL DATA:  History of left lower lobe non-small-cell lung cancer with shortness of breath, hypoxia, and tachycardia EXAM: CT ANGIOGRAPHY CHEST WITH CONTRAST TECHNIQUE: Multidetector CT imaging of the chest was performed using the standard protocol during bolus administration of intravenous contrast. Multiplanar CT image reconstructions and MIPs were obtained to evaluate the vascular anatomy. RADIATION DOSE REDUCTION: This exam was performed according to the departmental dose-optimization program which includes automated exposure control, adjustment of the mA and/or kV according to patient size and/or use of iterative reconstruction technique. CONTRAST:  80mL OMNIPAQUE IOHEXOL 350 MG/ML SOLN COMPARISON:  CT chest dated 03/31/2023, nuclear medicine PET dated 04/29/2023 FINDINGS: Cardiovascular: Right chest wall port terminates in the lower SVC. The study is high quality for the evaluation of pulmonary embolism. Filling defect within right upper lobe anterior segmental artery. Great vessels are normal in course and caliber. Normal heart size. No significant pericardial fluid/thickening. Coronary artery  calcifications and aortic atherosclerosis. Mediastinum/Nodes: Imaged thyroid gland without nodules meeting criteria for imaging follow-up by size. Normal esophagus. No substantial change in size of subcarinal 1.7 cm lymph node (5:85). New multi station lymphadenopathy, for example 1.3 cm AP window (5:64), 1.0 cm left hilar (5:84), and 1.1 cm right hilar (5:85). Lungs/Pleura: The central airways are patent. Lower lobe predominant peripheral ground-glass opacities and irregular consolidations throughout all lobes. New multifocal thick-walled cavitary lesions, for example central left upper lobe measuring 1.9 x 1.2 cm (13:57) no substantial change in size of dominant basilar left lower lobe irregular mass measuring 4.6 x 2.1 cm (remeasured, 13:116). Slightly decreased size of subpleural right lower lobe nodule measuring 2.2 x 1.3 cm (13:122), previously 2.4 x 1.5 cm. No pneumothorax. No pleural effusion. Upper abdomen: Unchanged enlarged spleen, 13.8 cm in AP dimension. Cholelithiasis. Musculoskeletal: No acute or abnormal lytic or blastic osseous lesions. Review of the MIP images confirms the above findings. IMPRESSION: 1. Filling defect within right upper lobe anterior segmental artery, consistent with pulmonary embolism. No evidence of right heart strain. 2. Lower lobe predominant peripheral ground-glass opacities and irregular consolidations throughout all lobes, with new multifocal thick-walled cavitary lesions. Findings are most consistent with multifocal pneumonia with suspicion for concurrent septic emboli. 3. No substantial change in size of dominant basilar left lower lobe irregular mass measuring 4.6 x 2.1 cm. Slightly decreased size of subpleural right lower lobe nodule measuring 2.2 x 1.3 cm. 4. New multi station lymphadenopathy, likely reactive. 5. Unchanged splenomegaly. 6. Aortic Atherosclerosis (ICD10-I70.0). Coronary artery calcifications. Assessment for potential risk factor modification, dietary  therapy or pharmacologic therapy may be warranted, if clinically indicated. Critical Value/emergent results were called by telephone at the time of interpretation on 06/01/2023 at 2:06 pm to provider [facility] , who verbally acknowledged these results. Electronically Signed   By: Agustin Cree M.D.   On: 06/01/2023 14:13  PERFORMANCE STATUS (ECOG) : {CHL ONC ECOG ZO:[id number]}  Review of Systems Unless otherwise noted, a complete review of systems is negative.  Physical Exam General: NAD Cardiovascular: regular rate and rhythm Pulmonary: clear ant fields Abdomen: soft, nontender, + bowel sounds Extremities: no edema, no joint deformities Skin: no rashes Neurological:  IMPRESSION: *** I introduced myself, Ruvim Risko RN, and Palliative's role in collaboration with the oncology team. Concept of Palliative Care was introduced as specialized medical care for people and their families living with serious  illness.  It focuses on providing relief from the symptoms and stress of a serious illness.  The goal is to improve quality of life for both the patient and the family. Values and goals of care important to patient and family were attempted to be elicited.    We discussed *** current illness and what it means in the larger context of *** on-going co-morbidities. Natural disease trajectory and expectations were discussed.  I discussed the importance of continued conversation with family and their medical providers regarding overall plan of care and treatment options, ensuring decisions are within the context of the patients values and GOCs.  PLAN: Established therapeutic relationship. Education provided on palliative's role in collaboration with their Oncology/Radiation team. I will plan to see patient back in 2-4 weeks in collaboration to other oncology appointments.    Patient expressed understanding and was in agreement with this plan. He also understands that He can call the clinic at any time  with any questions, concerns, or complaints.   Thank you for your referral and allowing Palliative to assist in Matthew Rivera's care.   Number and complexity of problems addressed: ***HIGH - 1 or more chronic illnesses with SEVERE exacerbation, progression, or side effects of treatment - advanced cancer, pain. Any controlled substances utilized were prescribed in the context of palliative care.   Visit consisted of counseling and education dealing with the complex and emotionally intense issues of symptom management and palliative care in the setting of serious and potentially life-threatening illness.Greater than 50%  of this time was spent counseling and coordinating care related to the above assessment and plan.  Signed by: Willette Alma, AGPCNP-BC Palliative Medicine Team/[facility]   *Please note that this is a verbal dictation therefore any spelling or grammatical errors are due to the "[employer] One" system interpretation.

## 2023-06-16 ENCOUNTER — Telehealth: Payer: Self-pay | Admitting: Radiation Oncology

## 2023-06-16 ENCOUNTER — Telehealth: Payer: Self-pay

## 2023-06-16 ENCOUNTER — Encounter: Payer: Self-pay | Admitting: Radiation Oncology

## 2023-06-16 NOTE — Telephone Encounter (Signed)
RN called pt for telephone follow up (nurse portion). Note completed and routed to Dr. Basilio Cairo for review.

## 2023-06-16 NOTE — Telephone Encounter (Signed)
PT REQUESTED A TELEPHONE VISIT FOR HIS 06-17-23 FU WITH DR. Basilio Cairo - CALLED PT TO INFORM HIM THAT I SWITCHED HIS FU TO TELEPHONE. PT VOICED UNDERSTANDING.

## 2023-06-17 ENCOUNTER — Ambulatory Visit
Admission: RE | Admit: 2023-06-17 | Discharge: 2023-06-17 | Disposition: A | Discharge status: Home / Self Care | Payer: 59 | Source: Ambulatory Visit | Attending: Radiation Oncology | Admitting: Radiation Oncology

## 2023-06-17 DIAGNOSIS — C7931 Secondary malignant neoplasm of brain: Secondary | ICD-10-CM | POA: Insufficient documentation

## 2023-06-17 NOTE — Progress Notes (Signed)
Radiation Oncology         [phone] ________________________________  Name: Matthew Rivera MRN: [id number]  Date: 06/17/2023  DOB: 17-May-1961  Outpatient follow-up by telephone.  The patient opted for telemedicine to maximize safety during the pandemic.  MyChart video was not obtainable.   CC: Matthew Nevins, MD  Matthew Gaul, MD  Diagnosis and Prior Radiotherapy:    ICD-10-CM   1. Secondary cancer of brain (HCC)  C79.31       CHIEF COMPLAINT: Here for follow-up and surveillance of brain cancer  Narrative:  The patient returns today for routine follow-up.  Matthew Rivera presents today for follow up after completing SRS radiation treatment to his brain on 05-16-23.   Recent neurologic symptoms, if any:  Seizures: no Headaches: no Nausea: no Dizziness/ataxia: no Difficulty with hand coordination: no Focal numbness/weakness: yes, weakness continues but he also remains on chemo Visual deficits/changes: no Confusion/Memory deficits: no  Other issues of note: Pt reports he is doing well from the radiation stand point. He does report that chemo is making him tired overall. Pt reports they have had to adjust his pain medications due how they were making him feel. He reports that has greatly helped. He has two chemo treatments left.                                ALLERGIES:  has No Known Allergies.  Meds: Current Outpatient Medications  Medication Sig Dispense Refill   albuterol (VENTOLIN HFA) 108 (90 Base) MCG/ACT inhaler Inhale 2 puffs into the lungs every 4 (four) hours as needed for wheezing or shortness of breath.     [START ON 07/01/2023] apixaban (ELIQUIS) 5 MG TABS tablet Take 1 tablet (5 mg total) by mouth 2 (two) times daily. This prescription will not start until 9/20 so can place on hold 60 tablet 2   APIXABAN (ELIQUIS) VTE STARTER PACK (10MG  AND 5MG ) Take as directed on package: start with two-5mg  tablets twice daily for 7 days. On day 8, switch to one-5mg  tablet twice  daily. 74 each 0   docusate sodium (COLACE) 100 MG capsule Take 100 mg by mouth in the morning.     folic acid (FOLVITE) 1 MG tablet Take 1 tablet (1 mg total) by mouth daily. 30 tablet 2   lidocaine-prilocaine (EMLA) cream Apply 1 Application topically as needed. (Patient taking differently: Apply 1 Application topically as needed (for port access- every 21 days).) 30 g 2   LORazepam (ATIVAN) 0.5 MG tablet Take 1 tablet (0.5 mg total) by mouth as needed for anxiety. Take 1-2 tablet by mouth 20 minutes before MRI or radiation treatment. (Patient taking differently: Take 0.5-1 mg by mouth See admin instructions. Take 0.5-1 mg by mouth 20 minutes before MRI or radiation treatment.) 12 tablet 0   Naphazoline-Pheniramine (VISINE OP) Place 1 drop into both eyes daily as needed (dry eyes).     nicotine (NICODERM CQ - DOSED IN MG/24 HOURS) 14 mg/24hr patch Place 1 patch (14 mg total) onto the skin daily. 28 patch 0   ondansetron (ZOFRAN-ODT) 4 MG disintegrating tablet Take 1 tablet (4 mg total) by mouth every 8 (eight) hours as needed for nausea or vomiting. (Patient taking differently: Take 4 mg by mouth every 8 (eight) hours as needed for nausea or vomiting (DISSOLVE ORALLY).) 30 tablet 2   oxycodone (OXY-IR) 5 MG capsule Take 10 mg by mouth every 6 (six) hours  as needed for pain (takes one on in the morning and one in evening).     pantoprazole (PROTONIX) 40 MG tablet Take 1 tablet (40 mg total) by mouth 2 (two) times daily. (Patient taking differently: Take 40 mg by mouth daily before breakfast.) 60 tablet 2   prochlorperazine (COMPAZINE) 10 MG tablet Take 1 tablet (10 mg total) by mouth every 6 (six) hours as needed for nausea or vomiting. 30 tablet 2   tamsulosin (FLOMAX) 0.4 MG CAPS capsule Take 0.4 mg by mouth at bedtime as needed (urinary flow issues).     Tiotropium Bromide-Olodaterol (STIOLTO RESPIMAT) 2.5-2.5 MCG/ACT AERS Inhale 2 puffs into the lungs daily. 4 g 11   traMADol (ULTRAM) 50 MG tablet  Take 50-100 mg by mouth 4 (four) times daily.     zolpidem (AMBIEN) 10 MG tablet Take 10 mg by mouth at bedtime as needed for sleep.     HYDROcodone-acetaminophen (NORCO) 5-325 MG tablet Take 1 tablet by mouth every 6 (six) hours as needed for moderate pain. (Patient not taking: Reported on 06/16/2023) 30 tablet 0   No current facility-administered medications for this encounter.    Physical Findings: The patient is in no acute distress. Patient is alert and oriented.  vitals were not taken for this visit. .      Lab Findings: Lab Results  Component Value Date   WBC 9.7 06/15/2023   HGB 8.5 (L) 06/15/2023   HCT 26.3 (L) 06/15/2023   MCV 93.3 06/15/2023   PLT 369 06/15/2023    Radiographic Findings: VAS [place] LOWER EXTREMITY VENOUS (DVT)  Result Date: 06/01/2023  Lower Venous DVT Study Patient Name:  Matthew Rivera  Date of Exam:   06/01/2023 Medical Rec #: [id number]     Accession #:    [id number] Date of Birth: June 10, 1961      Patient Gender: M Patient Age:   62 years Exam Location:  [facility] Procedure:      VAS [place] LOWER EXTREMITY VENOUS (DVT) Referring Phys: [facility] --------------------------------------------------------------------------------  Indications: Pulmonary embolism.  Risk Factors: Confirmed PE Cancer. Comparison Study: No prior studies. Performing Technologist: Chanda Busing RVT  Examination Guidelines: A complete evaluation includes B-mode imaging, spectral Doppler, color Doppler, and power Doppler as needed of all accessible portions of each vessel. Bilateral testing is considered an integral part of a complete examination. Limited examinations for reoccurring indications may be performed as noted. The reflux portion of the exam is performed with the patient in reverse Trendelenburg.  +-----------------+---------------+---------+-----------+----------+----------+ RIGHT            CompressibilityPhasicitySpontaneityPropertiesThrombus                                                                  Aging      +-----------------+---------------+---------+-----------+----------+----------+ CFV              Full           Yes      Yes                             +-----------------+---------------+---------+-----------+----------+----------+ SFJ              Full                                                    +-----------------+---------------+---------+-----------+----------+----------+  FV Prox          Full                                                    +-----------------+---------------+---------+-----------+----------+----------+ FV Mid           Full                                                    +-----------------+---------------+---------+-----------+----------+----------+ FV Distal        Full                                                    +-----------------+---------------+---------+-----------+----------+----------+ PFV              Full                                                    +-----------------+---------------+---------+-----------+----------+----------+ POP              None           No       No                   Acute      +-----------------+---------------+---------+-----------+----------+----------+ PTV              Full                                                    +-----------------+---------------+---------+-----------+----------+----------+ PERO             Full                                                    +-----------------+---------------+---------+-----------+----------+----------+ Gastroc          Partial                                      Acute      +-----------------+---------------+---------+-----------+----------+----------+ Tibioperoneal    Partial                                      Acute      trunk                                                                     +-----------------+---------------+---------+-----------+----------+----------+   +---------+---------------+---------+-----------+----------+--------------+  LEFT     CompressibilityPhasicitySpontaneityPropertiesThrombus Aging +---------+---------------+---------+-----------+----------+--------------+ CFV      Full           Yes      Yes                                 +---------+---------------+---------+-----------+----------+--------------+ SFJ      Full                                                        +---------+---------------+---------+-----------+----------+--------------+ FV Prox  Full                                                        +---------+---------------+---------+-----------+----------+--------------+ FV Mid   Full                                                        +---------+---------------+---------+-----------+----------+--------------+ FV DistalFull                                                        +---------+---------------+---------+-----------+----------+--------------+ PFV      Full                                                        +---------+---------------+---------+-----------+----------+--------------+ POP      None           No       No                   Acute          +---------+---------------+---------+-----------+----------+--------------+ PTV      Full                                                        +---------+---------------+---------+-----------+----------+--------------+ PERO     Full                                                        +---------+---------------+---------+-----------+----------+--------------+ Soleal   None                                         Acute          +---------+---------------+---------+-----------+----------+--------------+  Gastroc  None                                         Acute           +---------+---------------+---------+-----------+----------+--------------+     Summary: RIGHT: - Findings consistent with acute deep vein thrombosis involving the right popliteal vein, tibioperoneal trunk, and right gastrocnemius veins. - No cystic structure found in the popliteal fossa.  LEFT: - Findings consistent with acute deep vein thrombosis involving the left popliteal vein, left soleal veins, and left gastrocnemius veins. - No cystic structure found in the popliteal fossa.  *See table(s) above for measurements and observations. Electronically signed by Heath Lark on 06/01/2023 at 8:50:30 PM.    Final    CT Angio Chest PE W and/or Wo Contrast  Result Date: 06/01/2023 CLINICAL DATA:  History of left lower lobe non-small-cell lung cancer with shortness of breath, hypoxia, and tachycardia EXAM: CT ANGIOGRAPHY CHEST WITH CONTRAST TECHNIQUE: Multidetector CT imaging of the chest was performed using the standard protocol during bolus administration of intravenous contrast. Multiplanar CT image reconstructions and MIPs were obtained to evaluate the vascular anatomy. RADIATION DOSE REDUCTION: This exam was performed according to the departmental dose-optimization program which includes automated exposure control, adjustment of the mA and/or kV according to patient size and/or use of iterative reconstruction technique. CONTRAST:  80mL OMNIPAQUE IOHEXOL 350 MG/ML SOLN COMPARISON:  CT chest dated 03/31/2023, nuclear medicine PET dated 04/29/2023 FINDINGS: Cardiovascular: Right chest wall port terminates in the lower SVC. The study is high quality for the evaluation of pulmonary embolism. Filling defect within right upper lobe anterior segmental artery. Great vessels are normal in course and caliber. Normal heart size. No significant pericardial fluid/thickening. Coronary artery calcifications and aortic atherosclerosis. Mediastinum/Nodes: Imaged thyroid gland without nodules meeting criteria for imaging follow-up  by size. Normal esophagus. No substantial change in size of subcarinal 1.7 cm lymph node (5:85). New multi station lymphadenopathy, for example 1.3 cm AP window (5:64), 1.0 cm left hilar (5:84), and 1.1 cm right hilar (5:85). Lungs/Pleura: The central airways are patent. Lower lobe predominant peripheral ground-glass opacities and irregular consolidations throughout all lobes. New multifocal thick-walled cavitary lesions, for example central left upper lobe measuring 1.9 x 1.2 cm (13:57) no substantial change in size of dominant basilar left lower lobe irregular mass measuring 4.6 x 2.1 cm (remeasured, 13:116). Slightly decreased size of subpleural right lower lobe nodule measuring 2.2 x 1.3 cm (13:122), previously 2.4 x 1.5 cm. No pneumothorax. No pleural effusion. Upper abdomen: Unchanged enlarged spleen, 13.8 cm in AP dimension. Cholelithiasis. Musculoskeletal: No acute or abnormal lytic or blastic osseous lesions. Review of the MIP images confirms the above findings. IMPRESSION: 1. Filling defect within right upper lobe anterior segmental artery, consistent with pulmonary embolism. No evidence of right heart strain. 2. Lower lobe predominant peripheral ground-glass opacities and irregular consolidations throughout all lobes, with new multifocal thick-walled cavitary lesions. Findings are most consistent with multifocal pneumonia with suspicion for concurrent septic emboli. 3. No substantial change in size of dominant basilar left lower lobe irregular mass measuring 4.6 x 2.1 cm. Slightly decreased size of subpleural right lower lobe nodule measuring 2.2 x 1.3 cm. 4. New multi station lymphadenopathy, likely reactive. 5. Unchanged splenomegaly. 6. Aortic Atherosclerosis (ICD10-I70.0). Coronary artery calcifications. Assessment for potential risk factor modification, dietary therapy or pharmacologic therapy may be warranted, if clinically indicated. Critical Value/emergent  results were called by telephone at the  time of interpretation on 06/01/2023 at 2:06 pm to provider [facility] , who verbally acknowledged these results. Electronically Signed   By: Agustin Cree M.D.   On: 06/01/2023 14:13   DG Chest Port 1 View  Result Date: 06/01/2023 CLINICAL DATA:  Questionable sepsis.  Chest soreness EXAM: PORTABLE CHEST 1 VIEW COMPARISON:  X-ray 01/15/2011.  PET-CT 04/29/2023 FINDINGS: No pneumothorax or effusion. Normal cardiopericardial silhouette. No edema. Overlapping cardiac leads. Right IJ chest port with tip overlying the central SVC. The port is accessed multifocal consolidative opacities identified greatest in the medial right lung base also more peripherally in the right midlung and towards the lingula. Patient does have a history of lung cancer IMPRESSION: Chest port. Multifocal parenchymal opacities along the lower lungs. Possible acute infiltrate. Recommend follow-up. Please correlate with the history of known lung cancer in the prior PET CT of 04/29/2023. Recommend follow up Electronically Signed   By: Karen Kays M.D.   On: 06/01/2023 09:53    Impression/Plan: He is doing well without any acute neurologic complaints after stereotactic radiosurgery to the posterior fossa.  We will arrange a MRI of his brain in 2 months and he will follow-up with neurosurgery to review those results.  This encounter was provided by telemedicine platform; patient desired telemedicine during pandemic precautions.  MyChart video was not available and therefore telephone was used. The patient has given verbal consent for this type of encounter and has been advised to only accept a meeting of this type in a secure network environment. On date of service, in total, I spent 20 minutes on this encounter.   The attendants for this meeting include Lonie Peak  and Augustina Mood During the encounter, Lonie Peak was located at [facility] Radiation Oncology Department.  Augustina Mood was located at home.      _____________________________________   Lonie Peak, MD

## 2023-06-20 ENCOUNTER — Inpatient Hospital Stay: Payer: 59

## 2023-06-20 ENCOUNTER — Other Ambulatory Visit: Payer: Self-pay | Admitting: Radiation Therapy

## 2023-06-20 ENCOUNTER — Inpatient Hospital Stay: Payer: 59 | Admitting: Nurse Practitioner

## 2023-06-20 DIAGNOSIS — C7931 Secondary malignant neoplasm of brain: Secondary | ICD-10-CM

## 2023-06-20 NOTE — Progress Notes (Signed)
I received a call from the pt's wife, [name], who informed me that his portable oxygen delivery may not happen until after 1. If that is the case, the pt might not be able to make his lab appt and possibly his palliative care consult. I sent a [place] message to Mayra Reel, NP to inform her that pt may not make it to his appt. Awaiting response and what, if any, adjustments need to be made.

## 2023-06-21 ENCOUNTER — Encounter: Payer: Self-pay | Admitting: Internal Medicine

## 2023-06-21 ENCOUNTER — Telehealth: Payer: Self-pay | Admitting: Physician Assistant

## 2023-06-21 ENCOUNTER — Inpatient Hospital Stay: Payer: 59

## 2023-06-21 ENCOUNTER — Other Ambulatory Visit: Payer: Self-pay | Admitting: Physician Assistant

## 2023-06-21 DIAGNOSIS — D6481 Anemia due to antineoplastic chemotherapy: Secondary | ICD-10-CM

## 2023-06-21 DIAGNOSIS — Z5111 Encounter for antineoplastic chemotherapy: Secondary | ICD-10-CM | POA: Diagnosis not present

## 2023-06-21 DIAGNOSIS — C3492 Malignant neoplasm of unspecified part of left bronchus or lung: Secondary | ICD-10-CM

## 2023-06-21 LAB — PREPARE RBC (CROSSMATCH)

## 2023-06-21 LAB — SAMPLE TO BLOOD BANK

## 2023-06-21 LAB — CBC WITH DIFFERENTIAL (CANCER CENTER ONLY)
Abs Immature Granulocytes: 0.03 10*3/uL (ref 0.00–0.07)
Basophils Absolute: 0 10*3/uL (ref 0.0–0.1)
Basophils Relative: 1 %
Eosinophils Absolute: 0.3 10*3/uL (ref 0.0–0.5)
Eosinophils Relative: 8 %
HCT: 22.5 % — ABNORMAL LOW (ref 39.0–52.0)
Hemoglobin: 7.6 g/dL — ABNORMAL LOW (ref 13.0–17.0)
Immature Granulocytes: 1 %
Lymphocytes Relative: 13 %
Lymphs Abs: 0.5 10*3/uL — ABNORMAL LOW (ref 0.7–4.0)
MCH: 30.9 pg (ref 26.0–34.0)
MCHC: 33.8 g/dL (ref 30.0–36.0)
MCV: 91.5 fL (ref 80.0–100.0)
Monocytes Absolute: 0.2 10*3/uL (ref 0.1–1.0)
Monocytes Relative: 4 %
Neutro Abs: 3.1 10*3/uL (ref 1.7–7.7)
Neutrophils Relative %: 73 %
Platelet Count: 369 10*3/uL (ref 150–400)
RBC: 2.46 MIL/uL — ABNORMAL LOW (ref 4.22–5.81)
RDW: 18.4 % — ABNORMAL HIGH (ref 11.5–15.5)
WBC Count: 4.2 10*3/uL (ref 4.0–10.5)
nRBC: 0 % (ref 0.0–0.2)

## 2023-06-21 LAB — CMP (CANCER CENTER ONLY)
ALT: 22 U/L (ref 0–44)
AST: 27 U/L (ref 15–41)
Albumin: 3 g/dL — ABNORMAL LOW (ref 3.5–5.0)
Alkaline Phosphatase: 125 U/L (ref 38–126)
Anion gap: 6 (ref 5–15)
BUN: 14 mg/dL (ref 8–23)
CO2: 28 mmol/L (ref 22–32)
Calcium: 8.8 mg/dL — ABNORMAL LOW (ref 8.9–10.3)
Chloride: 100 mmol/L (ref 98–111)
Creatinine: 0.82 mg/dL (ref 0.61–1.24)
GFR, Estimated: >60 mL/min (ref >60)
Glucose, Bld: 103 mg/dL — ABNORMAL HIGH (ref 70–99)
Potassium: 4.3 mmol/L (ref 3.5–5.1)
Sodium: 134 mmol/L — ABNORMAL LOW (ref 135–145)
Total Bilirubin: 0.7 mg/dL (ref 0.3–1.2)
Total Protein: 6.7 g/dL (ref 6.5–8.1)

## 2023-06-21 NOTE — Telephone Encounter (Signed)
I called the patient and his wife to let them know they were looking at the schedule to see when he can receive 1 unit of blood this week.  Will be in touch with them later today to confirm appointment date and time.  They know not to remove the blue bracelet.

## 2023-06-22 ENCOUNTER — Inpatient Hospital Stay: Payer: 59

## 2023-06-22 VITALS — BP 112/76 | HR 96 | Temp 98.2°F | Resp 18

## 2023-06-22 DIAGNOSIS — D6481 Anemia due to antineoplastic chemotherapy: Secondary | ICD-10-CM

## 2023-06-22 DIAGNOSIS — Z5111 Encounter for antineoplastic chemotherapy: Secondary | ICD-10-CM | POA: Diagnosis not present

## 2023-06-22 DIAGNOSIS — Z95828 Presence of other vascular implants and grafts: Secondary | ICD-10-CM

## 2023-06-22 MED ORDER — DIPHENHYDRAMINE HCL 25 MG PO CAPS
25.0000 mg | ORAL_CAPSULE | Freq: Once | ORAL | Status: AC
  Administered 2023-06-22: 25 mg via ORAL
  Filled 2023-06-22: qty 1

## 2023-06-22 MED ORDER — ACETAMINOPHEN 325 MG PO TABS
650.0000 mg | ORAL_TABLET | Freq: Once | ORAL | Status: AC
  Administered 2023-06-22: 650 mg via ORAL
  Filled 2023-06-22: qty 2

## 2023-06-22 MED ORDER — SODIUM CHLORIDE 0.9% IV SOLUTION
250.0000 mL | Freq: Once | INTRAVENOUS | Status: AC
  Administered 2023-06-22: 250 mL via INTRAVENOUS

## 2023-06-22 MED ORDER — SODIUM CHLORIDE 0.9% FLUSH
10.0000 mL | INTRAVENOUS | Status: DC | PRN
  Administered 2023-06-22: 10 mL

## 2023-06-22 MED ORDER — HEPARIN SOD (PORK) LOCK FLUSH 100 UNIT/ML IV SOLN
500.0000 [IU] | Freq: Once | INTRAVENOUS | Status: AC | PRN
  Administered 2023-06-22: 500 [IU]

## 2023-06-22 NOTE — Patient Instructions (Signed)

## 2023-06-23 ENCOUNTER — Other Ambulatory Visit: Payer: Self-pay | Admitting: Radiation Therapy

## 2023-06-23 LAB — BPAM RBC
Blood Product Expiration Date: 202410112359
ISSUE DATE / TIME: 202409110843
Unit Type and Rh: 5100

## 2023-06-23 LAB — TYPE AND SCREEN
ABO/RH(D): O POS
Antibody Screen: NEGATIVE
Unit division: 0

## 2023-06-24 ENCOUNTER — Other Ambulatory Visit: Payer: Self-pay

## 2023-06-27 ENCOUNTER — Ambulatory Visit: Payer: 59

## 2023-06-27 ENCOUNTER — Inpatient Hospital Stay: Payer: 59

## 2023-06-27 ENCOUNTER — Other Ambulatory Visit: Payer: Self-pay

## 2023-06-27 ENCOUNTER — Other Ambulatory Visit: Payer: 59

## 2023-06-27 ENCOUNTER — Inpatient Hospital Stay (HOSPITAL_BASED_OUTPATIENT_CLINIC_OR_DEPARTMENT_OTHER): Payer: 59 | Admitting: Nurse Practitioner

## 2023-06-27 ENCOUNTER — Ambulatory Visit: Payer: 59 | Admitting: Internal Medicine

## 2023-06-27 VITALS — BP 128/85 | HR 106 | Temp 97.8°F | Resp 19 | Ht 71.0 in | Wt 130.1 lb

## 2023-06-27 DIAGNOSIS — G893 Neoplasm related pain (acute) (chronic): Secondary | ICD-10-CM | POA: Diagnosis not present

## 2023-06-27 DIAGNOSIS — R530 Neoplastic (malignant) related fatigue: Secondary | ICD-10-CM

## 2023-06-27 DIAGNOSIS — C3492 Malignant neoplasm of unspecified part of left bronchus or lung: Secondary | ICD-10-CM

## 2023-06-27 DIAGNOSIS — C7931 Secondary malignant neoplasm of brain: Secondary | ICD-10-CM

## 2023-06-27 DIAGNOSIS — Z5111 Encounter for antineoplastic chemotherapy: Secondary | ICD-10-CM | POA: Diagnosis not present

## 2023-06-27 DIAGNOSIS — Z95828 Presence of other vascular implants and grafts: Secondary | ICD-10-CM

## 2023-06-27 DIAGNOSIS — Z515 Encounter for palliative care: Secondary | ICD-10-CM

## 2023-06-27 LAB — CBC WITH DIFFERENTIAL (CANCER CENTER ONLY)
Abs Immature Granulocytes: 0.01 10*3/uL (ref 0.00–0.07)
Basophils Absolute: 0 10*3/uL (ref 0.0–0.1)
Basophils Relative: 1 %
Eosinophils Absolute: 0.3 10*3/uL (ref 0.0–0.5)
Eosinophils Relative: 8 %
HCT: 24.3 % — ABNORMAL LOW (ref 39.0–52.0)
Hemoglobin: 8 g/dL — ABNORMAL LOW (ref 13.0–17.0)
Immature Granulocytes: 0 %
Lymphocytes Relative: 17 %
Lymphs Abs: 0.6 10*3/uL — ABNORMAL LOW (ref 0.7–4.0)
MCH: 31.4 pg (ref 26.0–34.0)
MCHC: 32.9 g/dL (ref 30.0–36.0)
MCV: 95.3 fL (ref 80.0–100.0)
Monocytes Absolute: 0.7 10*3/uL (ref 0.1–1.0)
Monocytes Relative: 20 %
Neutro Abs: 1.8 10*3/uL (ref 1.7–7.7)
Neutrophils Relative %: 54 %
Platelet Count: 199 10*3/uL (ref 150–400)
RBC: 2.55 MIL/uL — ABNORMAL LOW (ref 4.22–5.81)
RDW: 20 % — ABNORMAL HIGH (ref 11.5–15.5)
WBC Count: 3.3 10*3/uL — ABNORMAL LOW (ref 4.0–10.5)
nRBC: 0 % (ref 0.0–0.2)

## 2023-06-27 LAB — CMP (CANCER CENTER ONLY)
ALT: 14 U/L (ref 0–44)
AST: 16 U/L (ref 15–41)
Albumin: 2.9 g/dL — ABNORMAL LOW (ref 3.5–5.0)
Alkaline Phosphatase: 100 U/L (ref 38–126)
Anion gap: 7 (ref 5–15)
BUN: 13 mg/dL (ref 8–23)
CO2: 28 mmol/L (ref 22–32)
Calcium: 8.7 mg/dL — ABNORMAL LOW (ref 8.9–10.3)
Chloride: 99 mmol/L (ref 98–111)
Creatinine: 0.78 mg/dL (ref 0.61–1.24)
GFR, Estimated: >60 mL/min (ref >60)
Glucose, Bld: 94 mg/dL (ref 70–99)
Potassium: 4 mmol/L (ref 3.5–5.1)
Sodium: 134 mmol/L — ABNORMAL LOW (ref 135–145)
Total Bilirubin: 0.7 mg/dL (ref 0.3–1.2)
Total Protein: 6.5 g/dL (ref 6.5–8.1)

## 2023-06-27 LAB — SAMPLE TO BLOOD BANK

## 2023-06-27 LAB — PREPARE RBC (CROSSMATCH)

## 2023-06-27 MED ORDER — SODIUM CHLORIDE 0.9% FLUSH
10.0000 mL | INTRAVENOUS | Status: DC | PRN
  Administered 2023-06-27: 10 mL

## 2023-06-27 NOTE — Progress Notes (Signed)
Palliative Medicine [facility]  [phone]) [phone] [phone]) [phone]   Name: Matthew Rivera Date: 06/27/2023 MRN: [id number]  DOB: 1961-01-26  Patient Care Team: [name], MD as PCP - General (Internal Medicine) Lanelle Bal, DO as Consulting Physician (Internal Medicine)    REASON FOR CONSULTATION: Matthew Rivera is a 62 y.o. male with oncologic medical history including non-small cell lung adenocarcinoma (/2024) with metastatic disease to the brain, status post craniotomy (03/2023).  Palliative ask to see for symptom management and goals of care.    SOCIAL HISTORY:     reports that he has been smoking cigarettes. He started smoking about 52 years ago. He has a 78.4 pack-year smoking history. He has never used smokeless tobacco. He reports that he does not currently use alcohol. He reports that he does not use drugs.  ADVANCE DIRECTIVES:  None on file  CODE STATUS: Full code  PAST MEDICAL HISTORY: Past Medical History:  Diagnosis Date   Dyslipidemia    GERD (gastroesophageal reflux disease)    Lung nodule seen on imaging study 03/31/2023   seen on CT results   Osteoarthritis    Smoker    long term smoker 1 ppd    PAST SURGICAL HISTORY:  Past Surgical History:  Procedure Laterality Date   BACK SURGERY     x 1   COLONOSCOPY WITH PROPOFOL N/A 08/18/2020   Procedure: COLONOSCOPY WITH PROPOFOL;  Surgeon: Lanelle Bal, DO;  Location: [facility];  Service: Endoscopy;  Laterality: N/A;  7:30am   CRANIOTOMY Right 04/07/2023   Procedure: Right suboccipital craniotomy for tumor resection;  Surgeon: Tressie Stalker, MD;  Location: [facility] OR;  Service: Neurosurgery;  Laterality: Right;  TO follow   IR IMAGING GUIDED PORT INSERTION  05/12/2023   MULTIPLE TOOTH EXTRACTIONS     wears full dentures   POLYPECTOMY  08/18/2020   Procedure: POLYPECTOMY INTESTINAL;  Surgeon: Lanelle Bal, DO;  Location: [facility];  Service: Endoscopy;;     HEMATOLOGY/ONCOLOGY HISTORY:  Oncology History  Adenosquamous carcinoma of left lung (HCC)  04/20/2023 Initial Diagnosis   Adenosquamous carcinoma of left lung (HCC)   04/20/2023 Cancer Staging   Staging form: Lung, AJCC 8th Edition - Clinical: Stage IVA (cT2b, cN2, cM1b) - Signed by Si Gaul, MD on 04/20/2023   05/16/2023 -  Chemotherapy   Patient is on Treatment Plan : LUNG Carboplatin (5) + Pemetrexed (500) + Pembrolizumab (200) D1 q21d Induction x 4 cycles / Maintenance Pemetrexed (500) + Pembrolizumab (200) D1 q21d       ALLERGIES:  has No Known Allergies.  MEDICATIONS:  Current Outpatient Medications  Medication Sig Dispense Refill   albuterol (VENTOLIN HFA) 108 (90 Base) MCG/ACT inhaler Inhale 2 puffs into the lungs every 4 (four) hours as needed for wheezing or shortness of breath.     [START ON 07/01/2023] apixaban (ELIQUIS) 5 MG TABS tablet Take 1 tablet (5 mg total) by mouth 2 (two) times daily. This prescription will not start until 9/20 so can place on hold 60 tablet 2   APIXABAN (ELIQUIS) VTE STARTER PACK (10MG  AND 5MG ) Take as directed on package: start with two-5mg  tablets twice daily for 7 days. On day 8, switch to one-5mg  tablet twice daily. 74 each 0   docusate sodium (COLACE) 100 MG capsule Take 100 mg by mouth in the morning.     folic acid (FOLVITE) 1 MG tablet Take 1 tablet (1 mg total) by mouth daily.  30 tablet 2   HYDROcodone-acetaminophen (NORCO) 5-325 MG tablet Take 1 tablet by mouth every 6 (six) hours as needed for moderate pain. (Patient not taking: Reported on 06/16/2023) 30 tablet 0   lidocaine-prilocaine (EMLA) cream Apply 1 Application topically as needed. (Patient taking differently: Apply 1 Application topically as needed (for port access- every 21 days).) 30 g 2   LORazepam (ATIVAN) 0.5 MG tablet Take 1 tablet (0.5 mg total) by mouth as needed for anxiety. Take 1-2 tablet by mouth 20 minutes before MRI or radiation treatment. (Patient taking  differently: Take 0.5-1 mg by mouth See admin instructions. Take 0.5-1 mg by mouth 20 minutes before MRI or radiation treatment.) 12 tablet 0   Naphazoline-Pheniramine (VISINE OP) Place 1 drop into both eyes daily as needed (dry eyes).     nicotine (NICODERM CQ - DOSED IN MG/24 HOURS) 14 mg/24hr patch Place 1 patch (14 mg total) onto the skin daily. 28 patch 0   ondansetron (ZOFRAN-ODT) 4 MG disintegrating tablet Take 1 tablet (4 mg total) by mouth every 8 (eight) hours as needed for nausea or vomiting. (Patient taking differently: Take 4 mg by mouth every 8 (eight) hours as needed for nausea or vomiting (DISSOLVE ORALLY).) 30 tablet 2   oxycodone (OXY-IR) 5 MG capsule Take 10 mg by mouth every 6 (six) hours as needed for pain (takes one on in the morning and one in evening).     pantoprazole (PROTONIX) 40 MG tablet Take 1 tablet (40 mg total) by mouth 2 (two) times daily. (Patient taking differently: Take 40 mg by mouth daily before breakfast.) 60 tablet 2   prochlorperazine (COMPAZINE) 10 MG tablet Take 1 tablet (10 mg total) by mouth every 6 (six) hours as needed for nausea or vomiting. 30 tablet 2   tamsulosin (FLOMAX) 0.4 MG CAPS capsule Take 0.4 mg by mouth at bedtime as needed (urinary flow issues).     Tiotropium Bromide-Olodaterol (STIOLTO RESPIMAT) 2.5-2.5 MCG/ACT AERS Inhale 2 puffs into the lungs daily. 4 g 11   traMADol (ULTRAM) 50 MG tablet Take 50-100 mg by mouth 4 (four) times daily.     zolpidem (AMBIEN) 10 MG tablet Take 10 mg by mouth at bedtime as needed for sleep.     No current facility-administered medications for this visit.    VITAL SIGNS: There were no vitals taken for this visit. There were no vitals filed for this visit.  Estimated body mass index is 18.65 kg/m as calculated from the following:   Height as of 06/01/23: 5\' 11"  (1.803 m).   Weight as of 06/15/23: 133 lb 11.2 oz (60.6 kg).  LABS: CBC:    Component Value Date/Time   WBC 4.2 06/21/2023 1202   WBC 16.6  (H) 06/03/2023 0511   HGB 7.6 (L) 06/21/2023 1202   HCT 22.5 (L) 06/21/2023 1202   PLT 369 06/21/2023 1202   MCV 91.5 06/21/2023 1202   NEUTROABS 3.1 06/21/2023 1202   LYMPHSABS 0.5 (L) 06/21/2023 1202   MONOABS 0.2 06/21/2023 1202   EOSABS 0.3 06/21/2023 1202   BASOSABS 0.0 06/21/2023 1202   Comprehensive Metabolic Panel:    Component Value Date/Time   NA 134 (L) 06/21/2023 1202   K 4.3 06/21/2023 1202   CL 100 06/21/2023 1202   CO2 28 06/21/2023 1202   BUN 14 06/21/2023 1202   CREATININE 0.82 06/21/2023 1202   GLUCOSE 103 (H) 06/21/2023 1202   CALCIUM 8.8 (L) 06/21/2023 1202   AST 27 06/21/2023 1202  ALT 22 06/21/2023 1202   ALKPHOS 125 06/21/2023 1202   BILITOT 0.7 06/21/2023 1202   PROT 6.7 06/21/2023 1202   ALBUMIN 3.0 (L) 06/21/2023 1202    RADIOGRAPHIC STUDIES: CT Angio Chest PE W and/or Wo Contrast  Result Date: 06/01/2023 CLINICAL DATA:  History of left lower lobe non-small-cell lung cancer with shortness of breath, hypoxia, and tachycardia EXAM: CT ANGIOGRAPHY CHEST WITH CONTRAST TECHNIQUE: Multidetector CT imaging of the chest was performed using the standard protocol during bolus administration of intravenous contrast. Multiplanar CT image reconstructions and MIPs were obtained to evaluate the vascular anatomy. RADIATION DOSE REDUCTION: This exam was performed according to the departmental dose-optimization program which includes automated exposure control, adjustment of the mA and/or kV according to patient size and/or use of iterative reconstruction technique. CONTRAST:  80mL OMNIPAQUE IOHEXOL 350 MG/ML SOLN COMPARISON:  CT chest dated 03/31/2023, nuclear medicine PET dated 04/29/2023 FINDINGS: Cardiovascular: Right chest wall port terminates in the lower SVC. The study is high quality for the evaluation of pulmonary embolism. Filling defect within right upper lobe anterior segmental artery. Great vessels are normal in course and caliber. Normal heart size. No  significant pericardial fluid/thickening. Coronary artery calcifications and aortic atherosclerosis. Mediastinum/Nodes: Imaged thyroid gland without nodules meeting criteria for imaging follow-up by size. Normal esophagus. No substantial change in size of subcarinal 1.7 cm lymph node (5:85). New multi station lymphadenopathy, for example 1.3 cm AP window (5:64), 1.0 cm left hilar (5:84), and 1.1 cm right hilar (5:85). Lungs/Pleura: The central airways are patent. Lower lobe predominant peripheral ground-glass opacities and irregular consolidations throughout all lobes. New multifocal thick-walled cavitary lesions, for example central left upper lobe measuring 1.9 x 1.2 cm (13:57) no substantial change in size of dominant basilar left lower lobe irregular mass measuring 4.6 x 2.1 cm (remeasured, 13:116). Slightly decreased size of subpleural right lower lobe nodule measuring 2.2 x 1.3 cm (13:122), previously 2.4 x 1.5 cm. No pneumothorax. No pleural effusion. Upper abdomen: Unchanged enlarged spleen, 13.8 cm in AP dimension. Cholelithiasis. Musculoskeletal: No acute or abnormal lytic or blastic osseous lesions. Review of the MIP images confirms the above findings. IMPRESSION: 1. Filling defect within right upper lobe anterior segmental artery, consistent with pulmonary embolism. No evidence of right heart strain. 2. Lower lobe predominant peripheral ground-glass opacities and irregular consolidations throughout all lobes, with new multifocal thick-walled cavitary lesions. Findings are most consistent with multifocal pneumonia with suspicion for concurrent septic emboli. 3. No substantial change in size of dominant basilar left lower lobe irregular mass measuring 4.6 x 2.1 cm. Slightly decreased size of subpleural right lower lobe nodule measuring 2.2 x 1.3 cm. 4. New multi station lymphadenopathy, likely reactive. 5. Unchanged splenomegaly. 6. Aortic Atherosclerosis (ICD10-I70.0). Coronary artery calcifications.  Assessment for potential risk factor modification, dietary therapy or pharmacologic therapy may be warranted, if clinically indicated. Critical Value/emergent results were called by telephone at the time of interpretation on 06/01/2023 at 2:06 pm to provider [facility] , who verbally acknowledged these results. Electronically Signed   By: Agustin Cree M.D.   On: 06/01/2023 14:13  PERFORMANCE STATUS (ECOG) : 1 - Symptomatic but completely ambulatory  Review of Systems  Constitutional:  Positive for activity change, appetite change and fatigue.  Respiratory:  Positive for shortness of breath.   Unless otherwise noted, a complete review of systems is negative.  Physical Exam General: NAD, in wheelchair  Cardiovascular: regular rate and rhythm Pulmonary: oxygen via nasal cannula Abdomen: soft, nontender, + bowel sounds Extremities:  no edema, no joint deformities Neurological:AAo x4  IMPRESSION: This is my initial visit with Matthew Rivera. His wife, Matthew Rivera is present. No acute distress. Oxygen in place via nasal cannula. Patient is ambulatory without assistance. In wheelchair for long distance ambulation needs. He is alert and able to engage appropriately in discussions.   I introduced myself, Maygan RN, and Palliative's role in collaboration with the oncology team. Concept of Palliative Care was introduced as specialized medical care for people and their families living with serious illness.  It focuses on providing relief from the symptoms and stress of a serious illness.  The goal is to improve quality of life for both the patient and the family. Values and goals of care important to patient and family were attempted to be elicited.   Matthew Rivera lives in the home with his wife of 1 year Matthew Rivera. They have been together for more than 7 years. He has a son from a previous marriage and a Environmental health practitioner who is like a daughter to him. 2 grandsons (8 and 55). 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 Loves sports. Football and [employer]. [employer] and [employer].   At home Matthew Rivera is able to perform most ADLs independently with some limitations due to fatigue and dyspnea. This has improved since obtaining oxygen and receiving recent blood transfusion. Some fatigue requiring rest breaks. Appetite fluctuates. Some days better than others. This has improved now that pain is much better managed.   Neoplasm related pain Matthew Rivera reports he has been treated for chronic back pain for years by his PCP. Since his cancer diagnosis his pain has increased and he is now experiencing some new pain in chest wall area. He was previously prescribed MS Contin after recent hospitalization however after taking for 2 days medication was discontinued due to feelings of falling and instability. He states he did not like how the medication made him feel. Describes pain on average 3-4 out of 10 which is manageable.   We discussed his current regimen which consist of oxycodone 10mg  and Tramadol 50 mg. Matthew Rivera reports this regimen is much better and his pain is well managed. His prescriptions have recently been adjusted by his PCP [name] and patient states he plans to continue managing his pain including cancer related pain.   Patient and wife knows we are available as needed. We will not need to schedule a follow up appointment at this time in regards to symptom management given pain is being managed outside of the cancer center. He has our contact information and can call at any time for any unmet palliative needs.   Goals of Care  We discussed his current illness and what it means in the larger context of his on-going co-morbidities. Natural disease trajectory and expectations were discussed.  Matthew Rivera and his wife are realistic in his understanding of his cancer and overall condition. He speaks to his appreciation of the overwhelming support and love of his wife, family, community, and  medical team. He shares his love for his grandchildren who he just spent the weekend with and how they mean the world to him. He becomes emotional expressing his feelings. Support provided. This, family, his grandchildren is what keeps Matthew Rivera going. This is what is most important to him and gives him the strength and fight that he has. His goals and focus are clearly expressed to treat the treatable allowing him every opportunity to  be here for them!  I discussed the importance of continued conversation with family and their medical providers regarding overall plan of care and treatment options, ensuring decisions are within the context of the patients values and GOCs.  PLAN: Established therapeutic relationship. Education provided on palliative's role in collaboration with their Oncology/Radiation team. Patient is currently under the care of his PCP [name] for pain management. No changes to regimen. Per patient request and for safety prescribing patient will continue to follow-up with PCP for chronic and cancer related pain. If anything changes in the future or further needs arise patient and wife knows our team is available. They have our contact information. Also aware they may contact our office for any other non pain related unmet palliative needs. No follow-up appointment needed at this time.  Goals clear to continue to treat the treatable allowing patient every opportunity to continue thriving. His family is very important to him.    Patient expressed understanding and was in agreement with this plan. He also understands that He can call the clinic at any time with any questions, concerns, or complaints.   Thank you for your referral and allowing Palliative to assist in Matthew Rivera care. It was a pleasure meeting and getting to know him.   Number and complexity of problems addressed: HIGH - 1 or more chronic illnesses with SEVERE exacerbation, progression, or side effects of  treatment - advanced cancer, pain. Any controlled substances utilized were prescribed in the context of palliative care.   Visit consisted of counseling and education dealing with the complex and emotionally intense issues of symptom management and palliative care in the setting of serious and potentially life-threatening illness.Greater than 50%  of this time was spent counseling and coordinating care related to the above assessment and plan.  Signed by: Willette Alma, AGPCNP-BC Palliative Medicine Team/[facility]   *Please note that this is a verbal dictation therefore any spelling or grammatical errors are due to the "[employer]agon [employer] One" system interpretation.

## 2023-06-28 ENCOUNTER — Inpatient Hospital Stay: Payer: 59

## 2023-06-29 ENCOUNTER — Inpatient Hospital Stay: Payer: 59

## 2023-06-29 DIAGNOSIS — Z5111 Encounter for antineoplastic chemotherapy: Secondary | ICD-10-CM | POA: Diagnosis not present

## 2023-06-29 DIAGNOSIS — C3492 Malignant neoplasm of unspecified part of left bronchus or lung: Secondary | ICD-10-CM

## 2023-06-29 MED ORDER — ACETAMINOPHEN 325 MG PO TABS
650.0000 mg | ORAL_TABLET | Freq: Once | ORAL | Status: AC
  Administered 2023-06-29: 650 mg via ORAL
  Filled 2023-06-29: qty 2

## 2023-06-29 MED ORDER — DIPHENHYDRAMINE HCL 25 MG PO CAPS
25.0000 mg | ORAL_CAPSULE | Freq: Once | ORAL | Status: AC
  Administered 2023-06-29: 25 mg via ORAL
  Filled 2023-06-29: qty 1

## 2023-06-29 MED ORDER — SODIUM CHLORIDE 0.9% IV SOLUTION
250.0000 mL | Freq: Once | INTRAVENOUS | Status: AC
  Administered 2023-06-29: 250 mL via INTRAVENOUS

## 2023-06-29 MED ORDER — HEPARIN SOD (PORK) LOCK FLUSH 100 UNIT/ML IV SOLN
250.0000 [IU] | INTRAVENOUS | Status: AC | PRN
  Administered 2023-06-29: 500 [IU]

## 2023-06-29 MED ORDER — SODIUM CHLORIDE 0.9% FLUSH
10.0000 mL | INTRAVENOUS | Status: AC | PRN
  Administered 2023-06-29: 10 mL

## 2023-06-29 NOTE — Patient Instructions (Signed)

## 2023-06-30 LAB — TYPE AND SCREEN
ABO/RH(D): O POS
Antibody Screen: NEGATIVE
Unit division: 0

## 2023-06-30 LAB — BPAM RBC
Blood Product Expiration Date: 202410162359
ISSUE DATE / TIME: 202409180856
Unit Type and Rh: 5100

## 2023-07-01 NOTE — Progress Notes (Unsigned)
[facility] OFFICE PROGRESS NOTE  [name], MD [address] [place] [address]  DIAGNOSIS: Stage IV (T2b, N2, M1b) non-small cell lung cancer, adenocarcinoma. He presented a large central left lung mass in addition to left hilar and mediastinal lymphadenopathy as well as additional lung lesion in the right lower lobe and solitary brain metastasis involving the inferior lateral right cerebellar hemisphere. He was diagnosed in June 2024.   PDL1 Expression 80%    Molecular studies: Positive for KRAS G12C mutation which can be used in the second line setting  PRIOR THERAPY: 1) status post craniotomy and surgical resection on April 07, 2023 under the care of [name]  2) Radiation to the brain cavity by Dr. Basilio Cairo.  Plan is for 3 treatments   CURRENT THERAPY: Systemic chemotherapy with carboplatin for an AUC of 5, Alimta 500 mg/m, and Keytruda 200 mg IV every 3 weeks.  First dose on 05/16/2023. Status post 1 cycle. He had significant intolerance to chemotherapy with cycle #1. Therefore, we will reduce his dose of carboplatin to an AUC of 4 and Alimta 400 mg/m2 starting from cycle #2. He is status post 2 cycles.   INTERVAL HISTORY: SHIVAAY RECHER 62 y.o. male returns to the clinic today for follow-up visit.  The patient was last seen in the clinic 3 weeks ago by Dr. Arbutus Ped and myself.  He was diagnosed with stage IV lung cancer.  He is status post 2 cycles of treatment.  He had significant intolerance with cycle #1 with nausea, vomiting, fatigue, generalized weakness, poor appetite, weight loss.  He also was admitted after cycle #1 for multifocal pneumonia and PE for which she is currently on Eliquis.  He is on supplemental oxygen with 3 L.  He was seen for cycle #2, the patient is feeling better at that time and the doses of his Alimta and carboplatin were reduced.  He tolerates this cycle***.  Ever he did not need to come in in the interval twice for blood  transfusions.  Overall, he is feeling ***today.  He follows with his PCP for chronic pain management. He established with palliative care on 06/27/23.   He is still on supplemental oxygen. Denies any new fatigue, chills, or night sweats. He does have cold intolerance secondary to the anemia and weight loss.  He saw a member the nutritionist team?  He drinks boost 1-2 times per day.  His appetite has ***. Denies any recent nausea, vomiting, diarrhea, or constipation. He has Zofran and Compazine moving forward if needed for nausea and vomiting. Denies any rashes or skin changes.  Ask how sore on sacral area is going*** . He is here today for evaluation repeat blood work before undergoing cycle #3.     MEDICAL HISTORY: Past Medical History:  Diagnosis Date   Dyslipidemia    GERD (gastroesophageal reflux disease)    Lung nodule seen on imaging study 03/31/2023   seen on CT results   Osteoarthritis    Smoker    long term smoker 1 ppd    ALLERGIES:  has No Known Allergies.  MEDICATIONS:  Current Outpatient Medications  Medication Sig Dispense Refill   albuterol (VENTOLIN HFA) 108 (90 Base) MCG/ACT inhaler Inhale 2 puffs into the lungs every 4 (four) hours as needed for wheezing or shortness of breath.     apixaban (ELIQUIS) 5 MG TABS tablet Take 1 tablet (5 mg total) by mouth 2 (two) times daily. This prescription will not start until  9/20 so can place on hold 60 tablet 2   APIXABAN (ELIQUIS) VTE STARTER PACK (10MG  AND 5MG ) Take as directed on package: start with two-5mg  tablets twice daily for 7 days. On day 8, switch to one-5mg  tablet twice daily. 74 each 0   docusate sodium (COLACE) 100 MG capsule Take 100 mg by mouth in the morning.     folic acid (FOLVITE) 1 MG tablet Take 1 tablet (1 mg total) by mouth daily. 30 tablet 2   HYDROcodone-acetaminophen (NORCO) 5-325 MG tablet Take 1 tablet by mouth every 6 (six) hours as needed for moderate pain. (Patient not taking: Reported on 06/16/2023) 30  tablet 0   lidocaine-prilocaine (EMLA) cream Apply 1 Application topically as needed. (Patient taking differently: Apply 1 Application topically as needed (for port access- every 21 days).) 30 g 2   LORazepam (ATIVAN) 0.5 MG tablet Take 1 tablet (0.5 mg total) by mouth as needed for anxiety. Take 1-2 tablet by mouth 20 minutes before MRI or radiation treatment. (Patient taking differently: Take 0.5-1 mg by mouth See admin instructions. Take 0.5-1 mg by mouth 20 minutes before MRI or radiation treatment.) 12 tablet 0   Naphazoline-Pheniramine (VISINE OP) Place 1 drop into both eyes daily as needed (dry eyes).     nicotine (NICODERM CQ - DOSED IN MG/24 HOURS) 14 mg/24hr patch Place 1 patch (14 mg total) onto the skin daily. 28 patch 0   ondansetron (ZOFRAN-ODT) 4 MG disintegrating tablet Take 1 tablet (4 mg total) by mouth every 8 (eight) hours as needed for nausea or vomiting. (Patient taking differently: Take 4 mg by mouth every 8 (eight) hours as needed for nausea or vomiting (DISSOLVE ORALLY).) 30 tablet 2   oxycodone (OXY-IR) 5 MG capsule Take 10 mg by mouth every 6 (six) hours as needed for pain (takes one on in the morning and one in evening).     pantoprazole (PROTONIX) 40 MG tablet Take 1 tablet (40 mg total) by mouth 2 (two) times daily. (Patient taking differently: Take 40 mg by mouth daily before breakfast.) 60 tablet 2   prochlorperazine (COMPAZINE) 10 MG tablet Take 1 tablet (10 mg total) by mouth every 6 (six) hours as needed for nausea or vomiting. 30 tablet 2   tamsulosin (FLOMAX) 0.4 MG CAPS capsule Take 0.4 mg by mouth at bedtime as needed (urinary flow issues).     Tiotropium Bromide-Olodaterol (STIOLTO RESPIMAT) 2.5-2.5 MCG/ACT AERS Inhale 2 puffs into the lungs daily. 4 g 11   traMADol (ULTRAM) 50 MG tablet Take 50-100 mg by mouth 4 (four) times daily.     zolpidem (AMBIEN) 10 MG tablet Take 10 mg by mouth at bedtime as needed for sleep.     No current facility-administered  medications for this visit.    SURGICAL HISTORY:  Past Surgical History:  Procedure Laterality Date   BACK SURGERY     x 1   COLONOSCOPY WITH PROPOFOL N/A 08/18/2020   Procedure: COLONOSCOPY WITH PROPOFOL;  Surgeon: Lanelle Bal, DO;  Location: [facility];  Service: Endoscopy;  Laterality: N/A;  7:30am   CRANIOTOMY Right 04/07/2023   Procedure: Right suboccipital craniotomy for tumor resection;  Surgeon: Tressie Stalker, MD;  Location: [facility] OR;  Service: Neurosurgery;  Laterality: Right;  TO follow   IR IMAGING GUIDED PORT INSERTION  05/12/2023   MULTIPLE TOOTH EXTRACTIONS     wears full dentures   POLYPECTOMY  08/18/2020   Procedure: POLYPECTOMY INTESTINAL;  Surgeon: Lanelle Bal, DO;  Location: [facility];  Service: Endoscopy;;    REVIEW OF SYSTEMS:   Review of Systems  Constitutional: Negative for appetite change, chills, fatigue, fever and unexpected weight change.  HENT:   Negative for mouth sores, nosebleeds, sore throat and trouble swallowing.   Eyes: Negative for eye problems and icterus.  Respiratory: Negative for cough, hemoptysis, shortness of breath and wheezing.   Cardiovascular: Negative for chest pain and leg swelling.  Gastrointestinal: Negative for abdominal pain, constipation, diarrhea, nausea and vomiting.  Genitourinary: Negative for bladder incontinence, difficulty urinating, dysuria, frequency and hematuria.   Musculoskeletal: Negative for back pain, gait problem, neck pain and neck stiffness.  Skin: Negative for itching and rash.  Neurological: Negative for dizziness, extremity weakness, gait problem, headaches, light-headedness and seizures.  Hematological: Negative for adenopathy. Does not bruise/bleed easily.  Psychiatric/Behavioral: Negative for confusion, depression and sleep disturbance. The patient is not nervous/anxious.     PHYSICAL EXAMINATION:  There were no vitals taken for this visit.  ECOG PERFORMANCE STATUS: {CHL ONC ECOG  [id number]  Physical Exam  Constitutional: Oriented to person, place, and time and well-developed, well-nourished, and in no distress. No distress.  HENT:  Head: Normocephalic and atraumatic.  Mouth/Throat: Oropharynx is clear and moist. No oropharyngeal exudate.  Eyes: Conjunctivae are normal. Right eye exhibits no discharge. Left eye exhibits no discharge. No scleral icterus.  Neck: Normal range of motion. Neck supple.  Cardiovascular: Normal rate, regular rhythm, normal heart sounds and intact distal pulses.   Pulmonary/Chest: Effort normal and breath sounds normal. No respiratory distress. No wheezes. No rales.  Abdominal: Soft. Bowel sounds are normal. Exhibits no distension and no mass. There is no tenderness.  Musculoskeletal: Normal range of motion. Exhibits no edema.  Lymphadenopathy:    No cervical adenopathy.  Neurological: Alert and oriented to person, place, and time. Exhibits normal muscle tone. Gait normal. Coordination normal.  Skin: Skin is warm and dry. No rash noted. Not diaphoretic. No erythema. No pallor.  Psychiatric: Mood, memory and judgment normal.  Vitals reviewed.  LABORATORY DATA: Lab Results  Component Value Date   WBC 3.3 (L) 06/27/2023   HGB 8.0 (L) 06/27/2023   HCT 24.3 (L) 06/27/2023   MCV 95.3 06/27/2023   PLT 199 06/27/2023      Chemistry      Component Value Date/Time   NA 134 (L) 06/27/2023 0844   K 4.0 06/27/2023 0844   CL 99 06/27/2023 0844   CO2 28 06/27/2023 0844   BUN 13 06/27/2023 0844   CREATININE 0.78 06/27/2023 0844      Component Value Date/Time   CALCIUM 8.7 (L) 06/27/2023 0844   ALKPHOS 100 06/27/2023 0844   AST 16 06/27/2023 0844   ALT 14 06/27/2023 0844   BILITOT 0.7 06/27/2023 0844       RADIOGRAPHIC STUDIES:  VAS [place] LOWER EXTREMITY VENOUS (DVT)  Result Date: 06/01/2023  Lower Venous DVT Study Patient Name:  BENNIE LOUGHNER  Date of Exam:   06/01/2023 Medical Rec #: [id number]     Accession #:    [id number] Date  of Birth: 06/17/1961      Patient Gender: M Patient Age:   5 years Exam Location:  [facility] Procedure:      VAS [place] LOWER EXTREMITY VENOUS (DVT) Referring Phys: [facility] --------------------------------------------------------------------------------  Indications: Pulmonary embolism.  Risk Factors: Confirmed PE Cancer. Comparison Study: No prior studies. Performing Technologist: Chanda Busing RVT  Examination Guidelines: A complete evaluation includes B-mode imaging, spectral Doppler,  color Doppler, and power Doppler as needed of all accessible portions of each vessel. Bilateral testing is considered an integral part of a complete examination. Limited examinations for reoccurring indications may be performed as noted. The reflux portion of the exam is performed with the patient in reverse Trendelenburg.  +-----------------+---------------+---------+-----------+----------+----------+ RIGHT            CompressibilityPhasicitySpontaneityPropertiesThrombus                                                                 Aging      +-----------------+---------------+---------+-----------+----------+----------+ CFV              Full           Yes      Yes                             +-----------------+---------------+---------+-----------+----------+----------+ SFJ              Full                                                    +-----------------+---------------+---------+-----------+----------+----------+ FV Prox          Full                                                    +-----------------+---------------+---------+-----------+----------+----------+ FV Mid           Full                                                    +-----------------+---------------+---------+-----------+----------+----------+ FV Distal        Full                                                     +-----------------+---------------+---------+-----------+----------+----------+ PFV              Full                                                    +-----------------+---------------+---------+-----------+----------+----------+ POP              None           No       No                   Acute      +-----------------+---------------+---------+-----------+----------+----------+ PTV              Full                                                    +-----------------+---------------+---------+-----------+----------+----------+  PERO             Full                                                    +-----------------+---------------+---------+-----------+----------+----------+ Gastroc          Partial                                      Acute      +-----------------+---------------+---------+-----------+----------+----------+ Tibioperoneal    Partial                                      Acute      trunk                                                                    +-----------------+---------------+---------+-----------+----------+----------+   +---------+---------------+---------+-----------+----------+--------------+ LEFT     CompressibilityPhasicitySpontaneityPropertiesThrombus Aging +---------+---------------+---------+-----------+----------+--------------+ CFV      Full           Yes      Yes                                 +---------+---------------+---------+-----------+----------+--------------+ SFJ      Full                                                        +---------+---------------+---------+-----------+----------+--------------+ FV Prox  Full                                                        +---------+---------------+---------+-----------+----------+--------------+ FV Mid   Full                                                         +---------+---------------+---------+-----------+----------+--------------+ FV DistalFull                                                        +---------+---------------+---------+-----------+----------+--------------+ PFV      Full                                                        +---------+---------------+---------+-----------+----------+--------------+  POP      None           No       No                   Acute          +---------+---------------+---------+-----------+----------+--------------+ PTV      Full                                                        +---------+---------------+---------+-----------+----------+--------------+ PERO     Full                                                        +---------+---------------+---------+-----------+----------+--------------+ Soleal   None                                         Acute          +---------+---------------+---------+-----------+----------+--------------+ Gastroc  None                                         Acute          +---------+---------------+---------+-----------+----------+--------------+     Summary: RIGHT: - Findings consistent with acute deep vein thrombosis involving the right popliteal vein, tibioperoneal trunk, and right gastrocnemius veins. - No cystic structure found in the popliteal fossa.  LEFT: - Findings consistent with acute deep vein thrombosis involving the left popliteal vein, left soleal veins, and left gastrocnemius veins. - No cystic structure found in the popliteal fossa.  *See table(s) above for measurements and observations. Electronically signed by Heath Lark on 06/01/2023 at 8:50:30 PM.    Final    CT Angio Chest PE W and/or Wo Contrast  Result Date: 06/01/2023 CLINICAL DATA:  History of left lower lobe non-small-cell lung cancer with shortness of breath, hypoxia, and tachycardia EXAM: CT ANGIOGRAPHY CHEST WITH CONTRAST TECHNIQUE: Multidetector CT  imaging of the chest was performed using the standard protocol during bolus administration of intravenous contrast. Multiplanar CT image reconstructions and MIPs were obtained to evaluate the vascular anatomy. RADIATION DOSE REDUCTION: This exam was performed according to the departmental dose-optimization program which includes automated exposure control, adjustment of the mA and/or kV according to patient size and/or use of iterative reconstruction technique. CONTRAST:  80mL OMNIPAQUE IOHEXOL 350 MG/ML SOLN COMPARISON:  CT chest dated 03/31/2023, nuclear medicine PET dated 04/29/2023 FINDINGS: Cardiovascular: Right chest wall port terminates in the lower SVC. The study is high quality for the evaluation of pulmonary embolism. Filling defect within right upper lobe anterior segmental artery. Great vessels are normal in course and caliber. Normal heart size. No significant pericardial fluid/thickening. Coronary artery calcifications and aortic atherosclerosis. Mediastinum/Nodes: Imaged thyroid gland without nodules meeting criteria for imaging follow-up by size. Normal esophagus. No substantial change in size of subcarinal 1.7 cm lymph node (5:85). New multi station lymphadenopathy, for example 1.3 cm AP window (5:64), 1.0 cm left hilar (5:84), and 1.1 cm right hilar (  5:85). Lungs/Pleura: The central airways are patent. Lower lobe predominant peripheral ground-glass opacities and irregular consolidations throughout all lobes. New multifocal thick-walled cavitary lesions, for example central left upper lobe measuring 1.9 x 1.2 cm (13:57) no substantial change in size of dominant basilar left lower lobe irregular mass measuring 4.6 x 2.1 cm (remeasured, 13:116). Slightly decreased size of subpleural right lower lobe nodule measuring 2.2 x 1.3 cm (13:122), previously 2.4 x 1.5 cm. No pneumothorax. No pleural effusion. Upper abdomen: Unchanged enlarged spleen, 13.8 cm in AP dimension. Cholelithiasis. Musculoskeletal: No  acute or abnormal lytic or blastic osseous lesions. Review of the MIP images confirms the above findings. IMPRESSION: 1. Filling defect within right upper lobe anterior segmental artery, consistent with pulmonary embolism. No evidence of right heart strain. 2. Lower lobe predominant peripheral ground-glass opacities and irregular consolidations throughout all lobes, with new multifocal thick-walled cavitary lesions. Findings are most consistent with multifocal pneumonia with suspicion for concurrent septic emboli. 3. No substantial change in size of dominant basilar left lower lobe irregular mass measuring 4.6 x 2.1 cm. Slightly decreased size of subpleural right lower lobe nodule measuring 2.2 x 1.3 cm. 4. New multi station lymphadenopathy, likely reactive. 5. Unchanged splenomegaly. 6. Aortic Atherosclerosis (ICD10-I70.0). Coronary artery calcifications. Assessment for potential risk factor modification, dietary therapy or pharmacologic therapy may be warranted, if clinically indicated. Critical Value/emergent results were called by telephone at the time of interpretation on 06/01/2023 at 2:06 pm to provider [facility] , who verbally acknowledged these results. Electronically Signed   By: Agustin Cree M.D.   On: 06/01/2023 14:13     ASSESSMENT/PLAN:  This is a very pleasant 62 year old male with stage IV (T2b, N2, M1b) non-small cell lung cancer, adenocarcinoma. He presented a large central left lung mass in addition to left hilar and mediastinal lymphadenopathy as well as additional lung lesion in the right lower lobe and solitary brain metastasis involving the inferior lateral right cerebellar hemisphere. He was diagnosed in June 2024.    His PD-L1 expression is 80%.  His molecular studies by foundation 1 shows K-ras G12 C mutation which can be used in the second line setting   The patient underwent craniotomy and surgical resection on 04/07/2023 by [name].   He subsequently had SRS to the brain  cavity by Dr. Basilio Cairo   He is currently undergoing palliative systemic chemotherapy with carboplatin for an AUC of 5 on day 1, Alimta 500 mg/m2 and keytruda 200 mg/m2 IV every 3 weeks. He is status post 2 cycles and had intolerance to treatment.  His dose was reduced to carboplatin for an AUC of 4, Alimta 400 mg/m starting from cycle #2.  The patient was seen with Dr. Arbutus Ped today.  Labs were reviewed.  The patient is required 2 blood transfusions in the interval.  Recommend ***  Will arrange for restaging CT scan of the chest, abdomen, pelvis prior to starting his next cycle of treatment.  He will continue to use his Compazine and Zofran and alternate.  He was instructed to take Imodium if needed for diarrhea.  We will arrange for blood transfusion if his hemoglobin is less than 8.  We will continue to check his labs on a weekly basis and I will ensure he has weekly sample of blood bank's.  Boost and Ensure?  He will continue to follow with his PCP for pain management.  He will continue on Eliquis for his history of PE  The patient was advised to call immediately  if she has any concerning symptoms in the interval. The patient voices understanding of current disease status and treatment options and is in agreement with the current care plan. All questions were answered. The patient knows to call the clinic with any problems, questions or concerns. We can certainly see the patient much sooner if necessary .     No orders of the defined types were placed in this encounter.    I spent {CHL ONC TIME VISIT - FAOZH:[id number]} counseling the patient face to face. The total time spent in the appointment was {CHL ONC TIME VISIT - EXBMW:[id number]}.  Kailon Treese L Zeva Leber, PA-C 07/01/23

## 2023-07-04 ENCOUNTER — Other Ambulatory Visit: Payer: 59

## 2023-07-04 MED FILL — Fosaprepitant Dimeglumine For IV Infusion 150 MG (Base Eq): INTRAVENOUS | Qty: 5 | Status: AC

## 2023-07-04 MED FILL — Dexamethasone Sodium Phosphate Inj 100 MG/10ML: INTRAMUSCULAR | Qty: 1 | Status: AC

## 2023-07-05 ENCOUNTER — Inpatient Hospital Stay: Payer: 59

## 2023-07-05 ENCOUNTER — Inpatient Hospital Stay: Payer: 59 | Admitting: Dietician

## 2023-07-05 ENCOUNTER — Inpatient Hospital Stay (HOSPITAL_BASED_OUTPATIENT_CLINIC_OR_DEPARTMENT_OTHER): Payer: 59 | Admitting: Physician Assistant

## 2023-07-05 VITALS — BP 120/78 | HR 99 | Resp 17

## 2023-07-05 VITALS — BP 126/77 | HR 102 | Temp 97.5°F | Resp 17 | Wt 137.3 lb

## 2023-07-05 DIAGNOSIS — C3492 Malignant neoplasm of unspecified part of left bronchus or lung: Secondary | ICD-10-CM

## 2023-07-05 DIAGNOSIS — Z5112 Encounter for antineoplastic immunotherapy: Secondary | ICD-10-CM | POA: Diagnosis not present

## 2023-07-05 DIAGNOSIS — G893 Neoplasm related pain (acute) (chronic): Secondary | ICD-10-CM

## 2023-07-05 DIAGNOSIS — Z5111 Encounter for antineoplastic chemotherapy: Secondary | ICD-10-CM | POA: Diagnosis not present

## 2023-07-05 DIAGNOSIS — Z95828 Presence of other vascular implants and grafts: Secondary | ICD-10-CM

## 2023-07-05 LAB — CBC WITH DIFFERENTIAL (CANCER CENTER ONLY)
Abs Immature Granulocytes: 0.07 10*3/uL (ref 0.00–0.07)
Basophils Absolute: 0 10*3/uL (ref 0.0–0.1)
Basophils Relative: 1 %
Eosinophils Absolute: 0.3 10*3/uL (ref 0.0–0.5)
Eosinophils Relative: 6 %
HCT: 29 % — ABNORMAL LOW (ref 39.0–52.0)
Hemoglobin: 9.8 g/dL — ABNORMAL LOW (ref 13.0–17.0)
Immature Granulocytes: 2 %
Lymphocytes Relative: 15 %
Lymphs Abs: 0.7 10*3/uL (ref 0.7–4.0)
MCH: 32 pg (ref 26.0–34.0)
MCHC: 33.8 g/dL (ref 30.0–36.0)
MCV: 94.8 fL (ref 80.0–100.0)
Monocytes Absolute: 0.6 10*3/uL (ref 0.1–1.0)
Monocytes Relative: 14 %
Neutro Abs: 2.9 10*3/uL (ref 1.7–7.7)
Neutrophils Relative %: 62 %
Platelet Count: 183 10*3/uL (ref 150–400)
RBC: 3.06 MIL/uL — ABNORMAL LOW (ref 4.22–5.81)
RDW: 19 % — ABNORMAL HIGH (ref 11.5–15.5)
WBC Count: 4.6 10*3/uL (ref 4.0–10.5)
nRBC: 0 % (ref 0.0–0.2)

## 2023-07-05 LAB — SAMPLE TO BLOOD BANK

## 2023-07-05 LAB — CMP (CANCER CENTER ONLY)
ALT: 11 U/L (ref 0–44)
AST: 16 U/L (ref 15–41)
Albumin: 2.7 g/dL — ABNORMAL LOW (ref 3.5–5.0)
Alkaline Phosphatase: 104 U/L (ref 38–126)
Anion gap: 6 (ref 5–15)
BUN: 12 mg/dL (ref 8–23)
CO2: 27 mmol/L (ref 22–32)
Calcium: 8 mg/dL — ABNORMAL LOW (ref 8.9–10.3)
Chloride: 103 mmol/L (ref 98–111)
Creatinine: 0.81 mg/dL (ref 0.61–1.24)
GFR, Estimated: >60 mL/min (ref >60)
Glucose, Bld: 110 mg/dL — ABNORMAL HIGH (ref 70–99)
Potassium: 3.8 mmol/L (ref 3.5–5.1)
Sodium: 136 mmol/L (ref 135–145)
Total Bilirubin: 0.5 mg/dL (ref 0.3–1.2)
Total Protein: 6.2 g/dL — ABNORMAL LOW (ref 6.5–8.1)

## 2023-07-05 LAB — TSH: TSH: 3.44 u[IU]/mL (ref 0.350–4.500)

## 2023-07-05 MED ORDER — HEPARIN SOD (PORK) LOCK FLUSH 100 UNIT/ML IV SOLN
500.0000 [IU] | Freq: Once | INTRAVENOUS | Status: AC | PRN
  Administered 2023-07-05: 500 [IU]

## 2023-07-05 MED ORDER — SODIUM CHLORIDE 0.9 % IV SOLN: Freq: Once | INTRAVENOUS | Status: AC

## 2023-07-05 MED ORDER — SODIUM CHLORIDE 0.9% FLUSH
10.0000 mL | INTRAVENOUS | Status: DC | PRN
  Administered 2023-07-05: 10 mL

## 2023-07-05 MED ORDER — SODIUM CHLORIDE 0.9 % IV SOLN
400.0000 mg/m2 | Freq: Once | INTRAVENOUS | Status: AC
  Administered 2023-07-05: 700 mg via INTRAVENOUS
  Filled 2023-07-05: qty 20

## 2023-07-05 MED ORDER — PALONOSETRON HCL INJECTION 0.25 MG/5ML
0.2500 mg | Freq: Once | INTRAVENOUS | Status: AC
  Administered 2023-07-05: 0.25 mg via INTRAVENOUS
  Filled 2023-07-05: qty 5

## 2023-07-05 MED ORDER — SODIUM CHLORIDE 0.9 % IV SOLN
424.0000 mg | Freq: Once | INTRAVENOUS | Status: AC
  Administered 2023-07-05: 420 mg via INTRAVENOUS
  Filled 2023-07-05: qty 42

## 2023-07-05 MED ORDER — SODIUM CHLORIDE 0.9 % IV SOLN
200.0000 mg | Freq: Once | INTRAVENOUS | Status: AC
  Administered 2023-07-05: 200 mg via INTRAVENOUS
  Filled 2023-07-05: qty 200

## 2023-07-05 MED ORDER — ACETAMINOPHEN 325 MG PO TABS
650.0000 mg | ORAL_TABLET | Freq: Once | ORAL | Status: AC
  Administered 2023-07-05: 650 mg via ORAL
  Filled 2023-07-05: qty 2

## 2023-07-05 MED ORDER — SODIUM CHLORIDE 0.9 % IV SOLN
150.0000 mg | Freq: Once | INTRAVENOUS | Status: AC
  Administered 2023-07-05: 150 mg via INTRAVENOUS
  Filled 2023-07-05: qty 150

## 2023-07-05 MED ORDER — SODIUM CHLORIDE 0.9 % IV SOLN
10.0000 mg | Freq: Once | INTRAVENOUS | Status: AC
  Administered 2023-07-05: 10 mg via INTRAVENOUS
  Filled 2023-07-05: qty 10

## 2023-07-05 MED ORDER — CYANOCOBALAMIN 1000 MCG/ML IJ SOLN
1000.0000 ug | Freq: Once | INTRAMUSCULAR | Status: AC
  Administered 2023-07-05: 1000 ug via INTRAMUSCULAR
  Filled 2023-07-05: qty 1

## 2023-07-05 NOTE — Patient Instructions (Signed)
[facility]  Discharge Instructions: Thank you for choosing [facility] to provide your oncology and hematology care.   If you have a lab appointment with the Cancer Center, please go directly to the Cancer Center and check in at the registration area.   Wear comfortable clothing and clothing appropriate for easy access to any Portacath or PICC line.   We strive to give you quality time with your provider. You may need to reschedule your appointment if you arrive late (15 or more minutes).  Arriving late affects you and other patients whose appointments are after yours.  Also, if you miss three or more appointments without notifying the office, you may be dismissed from the clinic at the provider's discretion.      For prescription refill requests, have your pharmacy contact our office and allow 72 hours for refills to be completed.    Today you received the following chemotherapy and/or immunotherapy agents: Keytruda/Alimta/Carbo.      To help prevent nausea and vomiting after your treatment, we encourage you to take your nausea medication as directed.  BELOW ARE SYMPTOMS THAT SHOULD BE REPORTED IMMEDIATELY: *FEVER GREATER THAN 100.4 F (38 C) OR HIGHER *CHILLS OR SWEATING *NAUSEA AND VOMITING THAT IS NOT CONTROLLED WITH YOUR NAUSEA MEDICATION *UNUSUAL SHORTNESS OF BREATH *UNUSUAL BRUISING OR BLEEDING *URINARY PROBLEMS (pain or burning when urinating, or frequent urination) *BOWEL PROBLEMS (unusual diarrhea, constipation, pain near the anus) TENDERNESS IN MOUTH AND THROAT WITH OR WITHOUT PRESENCE OF ULCERS (sore throat, sores in mouth, or a toothache) UNUSUAL RASH, SWELLING OR PAIN  UNUSUAL VAGINAL DISCHARGE OR ITCHING   Items with * indicate a potential emergency and should be followed up as soon as possible or go to the Emergency Department if any problems should occur.  Please show the CHEMOTHERAPY ALERT CARD or IMMUNOTHERAPY ALERT  CARD at check-in to the Emergency Department and triage nurse.  Should you have questions after your visit or need to cancel or reschedule your appointment, please contact [facility]  Dept: [phone]  and follow the prompts.  Office hours are 8:00 a.m. to 4:30 p.m. Monday - Friday. Please note that voicemails left after 4:00 p.m. may not be returned until the following business day.  We are closed weekends and major holidays. You have access to a nurse at all times for urgent questions. Please call the main number to the clinic Dept: [phone] and follow the prompts.   For any non-urgent questions, you may also contact your provider using MyChart. We now offer e-Visits for anyone 82 and older to request care online for non-urgent symptoms. For details visit [email or web address].   Also download the MyChart app! Go to the app store, search "MyChart", open the app, select [facility], and log in with your MyChart username and password.

## 2023-07-05 NOTE — Progress Notes (Signed)
Nutrition Assessment   Reason for Assessment: Referral (wt loss, poor appetite)   ASSESSMENT: 62 year old male with stage IV lung cancer, metastatic to brain. S/p craniotomy with resection followed by radiation. Patient is currently receiving chemoimmunotherapy with carboplatin, Alimta + Keytruda q21d. Patient is under the care of Dr. Arbutus Ped.    Past medical history includes acute pulmonary embolism, GERD, HLD, tobacco use  Met with pt and wife in infusion. Pt reports appetite has significantly improved in the last couple of weeks. His pain is now well controlled and feeling the best he has since diagnosis. Patient reports some improvement to breathing. Recalls continues 2-3 L, however now able to walk short distances around the house without wearing oxygen. Pt enjoys cooking. He is eating several small meals day. Yesterday had ham biscuit for breakfast, bacon wrapped tator tots at lunch, brunswick stew for dinner. Pt is drinking 2 Boost HP in between meals. Snacks on a variety of foods (sausage balls, fruit, sliced cheese, whole milk). He denies nausea, vomiting, diarrhea, constipation. Patient and wife share how pleased with the care they have received here at the cancer center.   Nutrition Focused Physical Exam: deferred    Medications: eliquis, colace, folic acid, ativan, zofran-odt, oxy-ir, protonix, compazine, tramadol, ambien  Labs: glucose 110, albumin 2.7  Anthropometrics: Weights have increased from 130 lb 1 oz on 9/16  Height: 5'11" Weight: 137 lb 4.8 oz  UBW: 153 lb (03/2023) BMI: 19.15   NUTRITION DIAGNOSIS: Unintended wt loss related to cancer and associated treatment side effects as evidenced by nausea, pain, poor appetite, 10% decrease from usual wt in 3 months - severe for time frame   INTERVENTION:  Continue strategies for increasing calories and protein with small frequent meals/snacks Discussed ways to add calories/protein to foods (adding cheese/butter/gravy,  using Boost in coffee/ice cream) Educated on foods with protein - recommend protein source at every meal Encouraged soft moist foods for ease of intake Continue drinking Boost HP/equivalent - samples of Ensure Complete, CIB powder, coupons provided Handouts provided: High calorie shake recipes, Snack Ideas, Soft/Moist HP foods, Sugar and Cancer Contact information given    MONITORING, EVALUATION, GOAL: Pt will tolerate increased calories and protein to minimize further wt loss during treatment   Next Visit: Wednesday October 16 during infusion

## 2023-07-05 NOTE — Progress Notes (Signed)
Per Cassie Heilingoetter, PA okay to treat today with HR 102.

## 2023-07-05 NOTE — Progress Notes (Signed)
Carboplatin diluted in NS258mL, Lot:  [id number], Exp:  09/09/2024.  Anola Gurney [place], [place], BCPS, BCOP 07/05/2023 10:44 AM

## 2023-07-06 ENCOUNTER — Inpatient Hospital Stay: Payer: 59 | Admitting: Nurse Practitioner

## 2023-07-06 LAB — T4: T4, Total: 7.1 ug/dL (ref 4.5–12.0)

## 2023-07-07 ENCOUNTER — Other Ambulatory Visit: Payer: Self-pay | Admitting: Radiation Therapy

## 2023-07-07 ENCOUNTER — Other Ambulatory Visit (HOSPITAL_COMMUNITY): Payer: Self-pay

## 2023-07-11 ENCOUNTER — Encounter (HOSPITAL_COMMUNITY): Payer: Self-pay

## 2023-07-11 ENCOUNTER — Other Ambulatory Visit: Payer: Self-pay

## 2023-07-11 ENCOUNTER — Emergency Department (HOSPITAL_COMMUNITY): Payer: 59

## 2023-07-11 ENCOUNTER — Inpatient Hospital Stay (HOSPITAL_COMMUNITY)
Admission: EM | Admit: 2023-07-11 | Discharge: 2023-07-14 | DRG: 871 | Disposition: A | Discharge status: Home / Self Care | Payer: 59 | Attending: Internal Medicine | Admitting: Internal Medicine

## 2023-07-11 ENCOUNTER — Other Ambulatory Visit (HOSPITAL_COMMUNITY): Payer: Self-pay

## 2023-07-11 DIAGNOSIS — Z79899 Other long term (current) drug therapy: Secondary | ICD-10-CM

## 2023-07-11 DIAGNOSIS — D509 Iron deficiency anemia, unspecified: Secondary | ICD-10-CM | POA: Diagnosis present

## 2023-07-11 DIAGNOSIS — C7931 Secondary malignant neoplasm of brain: Secondary | ICD-10-CM | POA: Diagnosis present

## 2023-07-11 DIAGNOSIS — M549 Dorsalgia, unspecified: Secondary | ICD-10-CM | POA: Diagnosis present

## 2023-07-11 DIAGNOSIS — F17210 Nicotine dependence, cigarettes, uncomplicated: Secondary | ICD-10-CM | POA: Diagnosis present

## 2023-07-11 DIAGNOSIS — Z7951 Long term (current) use of inhaled steroids: Secondary | ICD-10-CM

## 2023-07-11 DIAGNOSIS — T451X5A Adverse effect of antineoplastic and immunosuppressive drugs, initial encounter: Secondary | ICD-10-CM | POA: Diagnosis present

## 2023-07-11 DIAGNOSIS — D6481 Anemia due to antineoplastic chemotherapy: Secondary | ICD-10-CM | POA: Diagnosis present

## 2023-07-11 DIAGNOSIS — Z1152 Encounter for screening for COVID-19: Secondary | ICD-10-CM

## 2023-07-11 DIAGNOSIS — K219 Gastro-esophageal reflux disease without esophagitis: Secondary | ICD-10-CM | POA: Diagnosis present

## 2023-07-11 DIAGNOSIS — Z833 Family history of diabetes mellitus: Secondary | ICD-10-CM

## 2023-07-11 DIAGNOSIS — Y95 Nosocomial condition: Secondary | ICD-10-CM | POA: Diagnosis present

## 2023-07-11 DIAGNOSIS — C3492 Malignant neoplasm of unspecified part of left bronchus or lung: Secondary | ICD-10-CM | POA: Diagnosis present

## 2023-07-11 DIAGNOSIS — N400 Enlarged prostate without lower urinary tract symptoms: Secondary | ICD-10-CM | POA: Diagnosis present

## 2023-07-11 DIAGNOSIS — E440 Moderate protein-calorie malnutrition: Secondary | ICD-10-CM | POA: Diagnosis present

## 2023-07-11 DIAGNOSIS — E876 Hypokalemia: Secondary | ICD-10-CM | POA: Diagnosis not present

## 2023-07-11 DIAGNOSIS — Z681 Body mass index (BMI) 19 or less, adult: Secondary | ICD-10-CM

## 2023-07-11 DIAGNOSIS — Z86711 Personal history of pulmonary embolism: Secondary | ICD-10-CM

## 2023-07-11 DIAGNOSIS — E785 Hyperlipidemia, unspecified: Secondary | ICD-10-CM | POA: Diagnosis present

## 2023-07-11 DIAGNOSIS — I2699 Other pulmonary embolism without acute cor pulmonale: Secondary | ICD-10-CM | POA: Diagnosis present

## 2023-07-11 DIAGNOSIS — D701 Agranulocytosis secondary to cancer chemotherapy: Secondary | ICD-10-CM | POA: Diagnosis present

## 2023-07-11 DIAGNOSIS — Z9221 Personal history of antineoplastic chemotherapy: Secondary | ICD-10-CM | POA: Diagnosis not present

## 2023-07-11 DIAGNOSIS — E871 Hypo-osmolality and hyponatremia: Secondary | ICD-10-CM | POA: Diagnosis present

## 2023-07-11 DIAGNOSIS — J189 Pneumonia, unspecified organism: Secondary | ICD-10-CM | POA: Diagnosis present

## 2023-07-11 DIAGNOSIS — H9193 Unspecified hearing loss, bilateral: Secondary | ICD-10-CM | POA: Diagnosis present

## 2023-07-11 DIAGNOSIS — Z7901 Long term (current) use of anticoagulants: Secondary | ICD-10-CM

## 2023-07-11 DIAGNOSIS — D709 Neutropenia, unspecified: Secondary | ICD-10-CM | POA: Diagnosis present

## 2023-07-11 DIAGNOSIS — A419 Sepsis, unspecified organism: Secondary | ICD-10-CM | POA: Diagnosis present

## 2023-07-11 DIAGNOSIS — Z923 Personal history of irradiation: Secondary | ICD-10-CM

## 2023-07-11 DIAGNOSIS — D61810 Antineoplastic chemotherapy induced pancytopenia: Secondary | ICD-10-CM | POA: Diagnosis present

## 2023-07-11 DIAGNOSIS — E861 Hypovolemia: Secondary | ICD-10-CM | POA: Diagnosis present

## 2023-07-11 DIAGNOSIS — Z825 Family history of asthma and other chronic lower respiratory diseases: Secondary | ICD-10-CM

## 2023-07-11 LAB — COMPREHENSIVE METABOLIC PANEL
ALT: 36 U/L (ref 0–44)
AST: 29 U/L (ref 15–41)
Albumin: 2.2 g/dL — ABNORMAL LOW (ref 3.5–5.0)
Alkaline Phosphatase: 157 U/L — ABNORMAL HIGH (ref 38–126)
Anion gap: 9 (ref 5–15)
BUN: 13 mg/dL (ref 8–23)
CO2: 27 mmol/L (ref 22–32)
Calcium: 8 mg/dL — ABNORMAL LOW (ref 8.9–10.3)
Chloride: 96 mmol/L — ABNORMAL LOW (ref 98–111)
Creatinine, Ser: 0.69 mg/dL (ref 0.61–1.24)
GFR, Estimated: >60 mL/min (ref >60)
Glucose, Bld: 96 mg/dL (ref 70–99)
Potassium: 3.7 mmol/L (ref 3.5–5.1)
Sodium: 132 mmol/L — ABNORMAL LOW (ref 135–145)
Total Bilirubin: 1.1 mg/dL (ref 0.3–1.2)
Total Protein: 6.2 g/dL — ABNORMAL LOW (ref 6.5–8.1)

## 2023-07-11 LAB — CBC
HCT: 23.6 % — ABNORMAL LOW (ref 39.0–52.0)
Hemoglobin: 7.7 g/dL — ABNORMAL LOW (ref 13.0–17.0)
MCH: 30.6 pg (ref 26.0–34.0)
MCHC: 32.6 g/dL (ref 30.0–36.0)
MCV: 93.7 fL (ref 80.0–100.0)
Platelets: 167 10*3/uL (ref 150–400)
RBC: 2.52 MIL/uL — ABNORMAL LOW (ref 4.22–5.81)
RDW: 16.9 % — ABNORMAL HIGH (ref 11.5–15.5)
WBC: 1 10*3/uL — CL (ref 4.0–10.5)
nRBC: 0 % (ref 0.0–0.2)

## 2023-07-11 LAB — DIFFERENTIAL
Abs Immature Granulocytes: 0.01 10*3/uL (ref 0.00–0.07)
Basophils Absolute: 0 10*3/uL (ref 0.0–0.1)
Basophils Relative: 1 %
Eosinophils Absolute: 0.1 10*3/uL (ref 0.0–0.5)
Eosinophils Relative: 8 %
Immature Granulocytes: 1 %
Lymphocytes Relative: 23 %
Lymphs Abs: 0.2 10*3/uL — ABNORMAL LOW (ref 0.7–4.0)
Monocytes Absolute: 0.1 10*3/uL (ref 0.1–1.0)
Monocytes Relative: 9 %
Neutro Abs: 0.5 10*3/uL — ABNORMAL LOW (ref 1.7–7.7)
Neutrophils Relative %: 58 %

## 2023-07-11 LAB — LACTIC ACID, PLASMA: Lactic Acid, Venous: 0.7 mmol/L (ref 0.5–1.9)

## 2023-07-11 LAB — MAGNESIUM: Magnesium: 1.2 mg/dL — ABNORMAL LOW (ref 1.7–2.4)

## 2023-07-11 MED ORDER — VANCOMYCIN HCL 750 MG/150ML IV SOLN
750.0000 mg | Freq: Two times a day (BID) | INTRAVENOUS | Status: DC
  Administered 2023-07-12 – 2023-07-14 (×5): 750 mg via INTRAVENOUS
  Filled 2023-07-11 (×5): qty 150

## 2023-07-11 MED ORDER — APIXABAN 5 MG PO TABS
5.0000 mg | ORAL_TABLET | Freq: Two times a day (BID) | ORAL | Status: DC
  Administered 2023-07-11 – 2023-07-14 (×6): 5 mg via ORAL
  Filled 2023-07-11 (×6): qty 1

## 2023-07-11 MED ORDER — UMECLIDINIUM BROMIDE 62.5 MCG/ACT IN AEPB
1.0000 | INHALATION_SPRAY | Freq: Every day | RESPIRATORY_TRACT | Status: DC
  Administered 2023-07-12: 1 via RESPIRATORY_TRACT
  Filled 2023-07-11: qty 7

## 2023-07-11 MED ORDER — OXYCODONE HCL 5 MG PO TABS
10.0000 mg | ORAL_TABLET | ORAL | Status: DC | PRN
  Administered 2023-07-11 – 2023-07-14 (×12): 10 mg via ORAL
  Filled 2023-07-11 (×12): qty 2

## 2023-07-11 MED ORDER — PANTOPRAZOLE SODIUM 40 MG PO TBEC
40.0000 mg | DELAYED_RELEASE_TABLET | Freq: Two times a day (BID) | ORAL | Status: DC
  Administered 2023-07-11 – 2023-07-14 (×6): 40 mg via ORAL
  Filled 2023-07-11 (×6): qty 1

## 2023-07-11 MED ORDER — SODIUM CHLORIDE 0.9 % IV SOLN
2.0000 g | Freq: Three times a day (TID) | INTRAVENOUS | Status: DC
  Administered 2023-07-11 – 2023-07-14 (×8): 2 g via INTRAVENOUS
  Filled 2023-07-11 (×8): qty 12.5

## 2023-07-11 MED ORDER — SODIUM CHLORIDE 0.9 % IV BOLUS
1000.0000 mL | Freq: Once | INTRAVENOUS | Status: AC
  Administered 2023-07-11: 1000 mL via INTRAVENOUS

## 2023-07-11 MED ORDER — CHLORHEXIDINE GLUCONATE CLOTH 2 % EX PADS
6.0000 | MEDICATED_PAD | Freq: Every day | CUTANEOUS | Status: DC
  Administered 2023-07-12 – 2023-07-14 (×3): 6 via TOPICAL

## 2023-07-11 MED ORDER — ONDANSETRON HCL 4 MG/2ML IJ SOLN: 4.0000 mg | Freq: Four times a day (QID) | INTRAMUSCULAR | Status: DC | PRN

## 2023-07-11 MED ORDER — MAGNESIUM SULFATE 2 GM/50ML IV SOLN
2.0000 g | Freq: Once | INTRAVENOUS | Status: AC
  Administered 2023-07-11: 2 g via INTRAVENOUS
  Filled 2023-07-11: qty 50

## 2023-07-11 MED ORDER — ACETAMINOPHEN 325 MG PO TABS
650.0000 mg | ORAL_TABLET | Freq: Four times a day (QID) | ORAL | Status: DC | PRN
  Administered 2023-07-11: 650 mg via ORAL
  Filled 2023-07-11: qty 2

## 2023-07-11 MED ORDER — HYDROMORPHONE HCL 1 MG/ML IJ SOLN
1.0000 mg | Freq: Once | INTRAMUSCULAR | Status: AC
  Administered 2023-07-11: 1 mg via INTRAVENOUS
  Filled 2023-07-11: qty 1

## 2023-07-11 MED ORDER — ARFORMOTEROL TARTRATE 15 MCG/2ML IN NEBU
15.0000 ug | INHALATION_SOLUTION | Freq: Two times a day (BID) | RESPIRATORY_TRACT | Status: DC
  Administered 2023-07-12 – 2023-07-14 (×5): 15 ug via RESPIRATORY_TRACT
  Filled 2023-07-11 (×5): qty 2

## 2023-07-11 MED ORDER — FERROUS SULFATE 325 (65 FE) MG PO TABS
325.0000 mg | ORAL_TABLET | Freq: Every day | ORAL | Status: DC
  Administered 2023-07-12 – 2023-07-14 (×3): 325 mg via ORAL
  Filled 2023-07-11 (×3): qty 1

## 2023-07-11 MED ORDER — TBO-FILGRASTIM 300 MCG/0.5ML ~~LOC~~ SOSY
300.0000 ug | PREFILLED_SYRINGE | Freq: Every day | SUBCUTANEOUS | Status: DC
  Filled 2023-07-11: qty 0.5

## 2023-07-11 MED ORDER — TRAZODONE HCL 50 MG PO TABS: 25.0000 mg | ORAL_TABLET | Freq: Every evening | ORAL | Status: DC | PRN

## 2023-07-11 MED ORDER — SODIUM CHLORIDE 0.9 % IV SOLN
2.0000 g | Freq: Once | INTRAVENOUS | Status: AC
  Administered 2023-07-11: 2 g via INTRAVENOUS
  Filled 2023-07-11: qty 12.5

## 2023-07-11 MED ORDER — VANCOMYCIN HCL 1250 MG/250ML IV SOLN
1250.0000 mg | Freq: Once | INTRAVENOUS | Status: AC
  Administered 2023-07-11: 1250 mg via INTRAVENOUS
  Filled 2023-07-11: qty 250

## 2023-07-11 MED ORDER — DIPHENHYDRAMINE HCL 25 MG PO CAPS
50.0000 mg | ORAL_CAPSULE | Freq: Four times a day (QID) | ORAL | Status: DC | PRN
  Administered 2023-07-11 – 2023-07-14 (×3): 50 mg via ORAL
  Filled 2023-07-11 (×3): qty 2

## 2023-07-11 MED ORDER — ALBUTEROL SULFATE (2.5 MG/3ML) 0.083% IN NEBU: 2.5000 mg | INHALATION_SOLUTION | RESPIRATORY_TRACT | Status: DC | PRN

## 2023-07-11 MED ORDER — SODIUM CHLORIDE 0.9% FLUSH: 10.0000 mL | Freq: Two times a day (BID) | INTRAVENOUS | Status: DC

## 2023-07-11 MED ORDER — ONDANSETRON HCL 4 MG PO TABS
4.0000 mg | ORAL_TABLET | Freq: Four times a day (QID) | ORAL | Status: DC | PRN
  Administered 2023-07-12: 4 mg via ORAL
  Filled 2023-07-11: qty 1

## 2023-07-11 MED ORDER — TAMSULOSIN HCL 0.4 MG PO CAPS: 0.4000 mg | ORAL_CAPSULE | Freq: Every evening | ORAL | Status: DC | PRN

## 2023-07-11 MED ORDER — ACETAMINOPHEN 650 MG RE SUPP: 650.0000 mg | Freq: Four times a day (QID) | RECTAL | Status: DC | PRN

## 2023-07-11 NOTE — ED Provider Notes (Signed)
[place] EMERGENCY DEPARTMENT AT [facility] Provider Note   CSN: [id number] Arrival date & time: 07/11/23  1202     History  Chief Complaint  Patient presents with   Feet and Hand Swelling    Matthew Rivera is a 62 y.o. male.  62 year old male presenting emergency department for generalized malaise and pain all over.  Reports chemo for his lung cancer cancer this past Tuesday worsening malaise and generalized fatigue since that time.  Decreased appetite, but no nausea no vomiting.  Reports he has had increased pain to his right ankle, knee and right wrist.  Denies headache, vision changes, chest pain.  He does note that he has shortness of breath, seemingly worsened over the past week and has productive cough.  He is at his baseline oxygen requirements of 3 L, but notes he has had to periodically increase oxygen requirements at home with exertion.        Home Medications Prior to Admission medications   Medication Sig Start Date End Date Taking? Authorizing Provider  albuterol (VENTOLIN HFA) 108 (90 Base) MCG/ACT inhaler Inhale 2 puffs into the lungs every 4 (four) hours as needed for wheezing or shortness of breath.   Yes [provider]  apixaban (ELIQUIS) 5 MG TABS tablet Take 1 tablet (5 mg total) by mouth 2 (two) times daily. This prescription will not start until 9/20 so can place on hold Patient taking differently: Take 5 mg by mouth 2 (two) times daily. 07/01/23  Yes Dahal, Melina Schools, MD  ferrous sulfate 325 (65 FE) MG tablet Take 325 mg by mouth daily with breakfast.   Yes [provider]  folic acid (FOLVITE) 1 MG tablet Take 1 tablet (1 mg total) by mouth daily. 05/04/23  Yes Heilingoetter, Cassandra L, PA-C  lidocaine-prilocaine (EMLA) cream Apply 1 Application topically as needed. Patient taking differently: Apply 1 Application topically as needed (for port access- every 21 days). 05/04/23  Yes Heilingoetter, Cassandra L, PA-C  LORazepam (ATIVAN)  0.5 MG tablet Take 1 tablet (0.5 mg total) by mouth as needed for anxiety. Take 1-2 tablet by mouth 20 minutes before MRI or radiation treatment. Patient taking differently: Take 0.5 mg by mouth See admin instructions. Take 0.5-1 mg (1-2 tablets) by mouth 20 minutes before MRI or radiation treatment 05/03/23  Yes Erven Colla, PA-C  ondansetron (ZOFRAN-ODT) 4 MG disintegrating tablet Take 1 tablet (4 mg total) by mouth every 8 (eight) hours as needed for nausea or vomiting. Patient taking differently: Take 4 mg by mouth every 8 (eight) hours as needed for nausea or vomiting (DISSOLVE ORALLY). 05/24/23  Yes Heilingoetter, Cassandra L, PA-C  Oxycodone HCl 10 MG TABS Take 10 mg by mouth every 4 (four) hours as needed (for pain).   Yes [provider]  pantoprazole (PROTONIX) 40 MG tablet Take 1 tablet (40 mg total) by mouth 2 (two) times daily. Patient taking differently: Take 40 mg by mouth 2 (two) times daily before a meal. 04/01/23  Yes Rhetta Mura, MD  prochlorperazine (COMPAZINE) 10 MG tablet Take 1 tablet (10 mg total) by mouth every 6 (six) hours as needed for nausea or vomiting. 06/15/23  Yes Heilingoetter, Cassandra L, PA-C  tamsulosin (FLOMAX) 0.4 MG CAPS capsule Take 0.4 mg by mouth at bedtime as needed (urinary flow issues). 03/01/23  Yes [provider]  Tiotropium Bromide-Olodaterol (STIOLTO RESPIMAT) 2.5-2.5 MCG/ACT AERS Inhale 2 puffs into the lungs daily. 05/10/23  Yes Charlott Holler, MD  traMADol Janean Sark)  50 MG tablet Take 50-100 mg by mouth 2 (two) times daily as needed (for pain). 06/14/23  Yes [provider]  TYLENOL 500 MG tablet Take 500-1,000 mg by mouth every 6 (six) hours as needed for mild pain or headache.   Yes [provider]  APIXABAN Everlene Balls) VTE STARTER PACK (10MG  AND 5MG ) Take as directed on package: start with two-5mg  tablets twice daily for 7 days. On day 8, switch to one-5mg  tablet twice daily. Patient not taking: Reported on  07/11/2023 06/04/23   Lorin Glass, MD  nicotine (NICODERM CQ - DOSED IN MG/24 HOURS) 14 mg/24hr patch Place 1 patch (14 mg total) onto the skin daily. Patient not taking: Reported on 07/11/2023 05/10/23   Charlott Holler, MD      Allergies    Patient has no known allergies.    Review of Systems   Review of Systems  Physical Exam Updated Vital Signs BP (!) 125/102   Pulse (!) 121   Temp 98.6 F (37 C) (Oral)   Resp (!) 24   Ht 5\' 11"  (1.803 m)   Wt 62 kg   SpO2 96%   BMI 19.06 kg/m  Physical Exam Vitals and nursing note reviewed.  Constitutional:      Comments: Chronically ill-appearing  HENT:     Head: Normocephalic.     Nose: Nose normal.     Mouth/Throat:     Mouth: Mucous membranes are dry.  Cardiovascular:     Rate and Rhythm: Normal rate and regular rhythm.  Pulmonary:     Effort: Pulmonary effort is normal.     Breath sounds: Normal breath sounds.  Abdominal:     General: Abdomen is flat. There is no distension.     Tenderness: There is no abdominal tenderness. There is no guarding or rebound.  Musculoskeletal:     Right lower leg: No edema.     Left lower leg: No edema.     Comments: Right ankle and wrist with some tenderness to palpation.  No erythema warmth or swelling noted.  Neuro vastly intact in upper and lower extremities.  Equal pulses.  Skin:    General: Skin is warm and dry.     Capillary Refill: Capillary refill takes less than 2 seconds.     Findings: No erythema or rash.  Neurological:     Mental Status: He is alert and oriented to person, place, and time.  Psychiatric:        Mood and Affect: Mood normal.        Behavior: Behavior normal.     ED Results / Procedures / Treatments   Labs (all labs ordered are listed, but only abnormal results are displayed) Labs Reviewed  CBC - Abnormal; Notable for the following components:      Result Value   WBC 1.0 (*)    RBC 2.52 (*)    Hemoglobin 7.7 (*)    HCT 23.6 (*)    RDW 16.9 (*)    All  other components within normal limits  COMPREHENSIVE METABOLIC PANEL - Abnormal; Notable for the following components:   Sodium 132 (*)    Chloride 96 (*)    Calcium 8.0 (*)    Total Protein 6.2 (*)    Albumin 2.2 (*)    Alkaline Phosphatase 157 (*)    All other components within normal limits  MAGNESIUM - Abnormal; Notable for the following components:   Magnesium 1.2 (*)    All other components within  normal limits  CULTURE, BLOOD (ROUTINE X 2)  CULTURE, BLOOD (ROUTINE X 2)  LACTIC ACID, PLASMA  DIFFERENTIAL    EKG EKG Interpretation Date/Time:  Monday July 11 2023 13:05:40 EDT Ventricular Rate:  110 PR Interval:    QRS Duration:  89 QT Interval:  332 QTC Calculation: 450 R Axis:   49  Text Interpretation: Sinus tachycardia Ventricular premature complex Abnormal inferior Q waves Confirmed by Estanislado Pandy [phone]) on 07/11/2023 1:38:59 PM  Radiology DG Chest Portable 1 View  Result Date: 07/11/2023 CLINICAL DATA:  Cough.  Recent pneumonia. EXAM: PORTABLE CHEST 1 VIEW COMPARISON:  AP chest 06/01/2023, 01/15/2011; CT chest 06/01/2023 FINDINGS: Right chest wall porta catheter with tip overlying the mid superior vena cava, unchanged from 06/01/2023. Cardiac silhouette and mediastinal contours are within normal limits. Mild interval increase in density of heterogeneous airspace opacification throughout the inferior 40% of the right lung. Mildly increased density of lower density heterogeneous airspace opacity throughout the inferior 50% of the left lung. This likely represents worsening of a similar distribution multifocal pneumonia seen on prior 06/01/2023 radiograph and CT. No large pleural effusion is seen.  No pneumothorax. No acute skeletal abnormality. IMPRESSION: Mild interval increase in density of heterogeneous airspace opacification throughout the inferior 40% of the right lung and inferior 50% of the left lung. This likely represents worsening of a similar distribution  multifocal pneumonia seen on prior 06/01/2023 radiograph and CT. Electronically Signed   By: Neita Garnet M.D.   On: 07/11/2023 15:38   DG Hand 2 View Right  Result Date: 07/11/2023 CLINICAL DATA:  Right hand swelling and pain. EXAM: RIGHT HAND - 2 VIEW COMPARISON:  None Available. FINDINGS: Pulse oximeter overlies the distal aspect of the index finger, obscuring the bones on frontal view. Normal alignment. Neutral ulnar variance. Mild dorsal carpometacarpal degenerative spurring, likely at the third digit. No acute fracture or dislocation. IMPRESSION: Mild dorsal carpometacarpal degenerative spurring, likely at the third digit. Electronically Signed   By: Neita Garnet M.D.   On: 07/11/2023 15:34   DG Ankle 2 Views Right  Result Date: 07/11/2023 CLINICAL DATA:  Right foot swelling and pain. EXAM: RIGHT ANKLE - 2 VIEW COMPARISON:  Right ankle radiographs 02/11/2021 FINDINGS: Interval lateral plate and screw fixation of the prior distal fibular diaphyseal fracture with improved, now anatomic alignment complete healing. Single screw fixation of the prior medial malleolar fracture, with normal alignment and complete healing. 3 mm chronic ossicle medial to the medial malleolus. The ankle mortise is symmetric and intact. No acute fracture is seen. No dislocation. IMPRESSION: Compared to 02/11/2021: 1. Interval ORIF of the prior distal fibular diaphyseal and medial malleolar fractures with improved, now anatomic alignment and complete healing. 2. No acute fracture. Electronically Signed   By: Neita Garnet M.D.   On: 07/11/2023 15:32    Procedures Procedures    Medications Ordered in ED Medications  ceFEPIme (MAXIPIME) 2 g in sodium chloride 0.9 % 100 mL IVPB (has no administration in time range)  vancomycin (VANCOREADY) IVPB 1250 mg/250 mL (has no administration in time range)  sodium chloride 0.9 % bolus 1,000 mL (0 mLs Intravenous Stopped 07/11/23 1429)  HYDROmorphone (DILAUDID) injection 1 mg (1 mg  Intravenous Given 07/11/23 1319)  magnesium sulfate IVPB 2 g 50 mL (2 g Intravenous New Bag/Given 07/11/23 1429)    ED Course/ Medical Decision Making/ A&P Clinical Course as of 07/11/23 1550  Mon Jul 11, 2023  1238 Seen by oncology 07/05/23 per chart review :":DIAGNOSIS:  Stage IV (T2b, N2, M1b) non-small cell lung cancer, adenocarcinoma. He presented a large central left lung mass in addition to left hilar and mediastinal lymphadenopathy as well as additional lung lesion in the right lower lobe and solitary brain metastasis involving the inferior lateral right cerebellar hemisphere. He was diagnosed in June 2024." [TY]  1529 DG Chest Portable 1 View Treated for PNA on 06/01/23; Per chart review: "Multifocal pneumonia Cavitary lesions CT chest also showed irregular consolidation throughout all lobes as well as thick-walled cavitary lesions.  Findings are most consistent with multifocal pneumonia and concurrent septic emboli. No fever.  WBC count elevated.  Lactic acid level normal Currently on IV Rocephin, azithromycin Switch to oral Augmentin to continue for next 7 days with probiotics" [TY]  1542 DG Chest Portable 1 View IMPRESSION: Mild interval increase in density of heterogeneous airspace opacification throughout the inferior 40% of the right lung and inferior 50% of the left lung. This likely represents worsening of a similar distribution multifocal pneumonia seen on prior 06/01/2023 radiograph and CT.   [TY]  1547 Patient's heart rate remains persistently elevated.  He is leukopenic; differential still pending.  He has a significant drop in his hemoglobin as well.  No overt source of bleeding.  Hypomagnesemia,, receiving mag.  He is persistently tachycardic despite IV fluids and pain meds.  Appears to have worsening pneumonia.  Blood cultures ordered and pending.  Given patient recently hospitalized and treated for pneumonia we will start broad-spectrum antibiotics Vanco and cefepime.   Given patient's age, comorbid risk factors will admit for pneumonia. [TY]    Clinical Course User Index [TY] Coral Spikes, DO                                 Medical Decision Making 62 year old male with stage IV metastatic lung cancer currently on chemotherapy presenting emergency department for generalized malaise and fatigue.  Physical exam reassuring with no overt signs of infection.  No localizing neurodeficits.  Minor tenderness to several joints, but no erythema warmth or induration.  Patient clinically appears dry; will get basic labs, will also get images to exclude metastatic process.  Of note patient is tachycardic heart rate in the 119's.  Per chart review, appears that he has some chronic elevation in his heart rate as his last several visits heart rate was in the low 100s.  Does have recent PE, currently on Eliquis.  He is maintaining his oxygen saturation on home oxygen.  Patient requesting pain medications for pain all over.  Will give IV Dilaudid.  See ED course for further MDM and disposition.  Amount and/or Complexity of Data Reviewed Labs: ordered. Radiology: ordered. Decision-making details documented in ED Course. ECG/medicine tests: ordered.  Risk Prescription drug management. Decision regarding hospitalization.         Final Clinical Impression(s) / ED Diagnoses Final diagnoses:  Pneumonia of lower lobe due to infectious organism, unspecified laterality    Rx / DC Orders ED Discharge Orders     None         Coral Spikes, DO 07/11/23 1550

## 2023-07-11 NOTE — ED Notes (Signed)
ED TO INPATIENT HANDOFF REPORT  Name/Age/Gender Matthew Rivera 62 y.o. male  Code Status    Code Status Orders  (From admission, onward)           Start     Ordered   07/11/23 1600  Full code  Continuous       Question:  By:  Answer:  Consent: discussion documented in EHR   07/11/23 1600           Code Status History     Date Active Date Inactive Code Status Order ID Comments User Context   06/01/2023 1505 06/04/2023 2007 Full Code [id number]  Maryln Gottron, MD ED   05/12/2023 1226 05/13/2023 0510 Full Code [id number]  Gilmer Mor, DO HOV   04/07/2023 2346 04/09/2023 1643 Full Code [id number]  Tressie Stalker, MD Inpatient   03/31/2023 1201 04/01/2023 2130 Full Code [id number]  Vassie Loll, MD ED       Home/SNF/Other Home  Chief Complaint HCAP (healthcare-associated pneumonia) [J18.9]  Level of Care/Admitting Diagnosis ED Disposition     ED Disposition  Admit   Condition  --   Comment  Hospital Area: [facility] [[id number]]  Level of Care: Progressive [102]  Admit to Progressive based on following criteria: MULTISYSTEM THREATS such as stable sepsis, metabolic/electrolyte imbalance with or without encephalopathy that is responding to early treatment.  May admit patient to [facility] or [facility] if equivalent level of care is available:: Yes  Covid Evaluation: Asymptomatic - no recent exposure (last 10 days) testing not required  Diagnosis: HCAP (healthcare-associated pneumonia) [629528]  Admitting Physician: Maryln Gottron [[id number]]  Attending Physician: Kirby Crigler, MIR [email or web address] [[id number]]  Certification:: I certify this patient will need inpatient services for at least 2 midnights  Expected Medical Readiness: 07/14/2023          Medical History Past Medical History:  Diagnosis Date   Dyslipidemia    GERD (gastroesophageal reflux disease)    Lung nodule seen on imaging study 03/31/2023   seen on CT results   Osteoarthritis    Smoker     long term smoker 1 ppd    Allergies No Known Allergies  IV Location/Drains/Wounds Patient Lines/Drains/Airways Status     Active Line/Drains/Airways     Name Placement date Placement time Site Days   Implanted Port 05/12/23 Right Chest 05/12/23  1206  Chest  60   Peripheral IV 07/11/23 20 G Right Antecubital 07/11/23  1313  Antecubital  less than 1            Labs/Imaging Results for orders placed or performed during the hospital encounter of 07/11/23 (from the past 48 hour(s))  CBC     Status: Abnormal   Collection Time: 07/11/23  1:15 PM  Result Value Ref Range   WBC 1.0 (LL) 4.0 - 10.5 K/uL    Comment: REPEATED TO VERIFY THIS CRITICAL RESULT HAS VERIFIED AND BEEN CALLED TO Missie Gehrig, M RN @ 1329 ON [id number] CAL BY LINDSAY,CELESTE ON 09 30 2024 AT 1327, AND HAS BEEN READ BACK.     RBC 2.52 (L) 4.22 - 5.81 MIL/uL   Hemoglobin 7.7 (L) 13.0 - 17.0 g/dL   HCT 47.4 (L) 25.9 - 56.3 %   MCV 93.7 80.0 - 100.0 fL   MCH 30.6 26.0 - 34.0 pg   MCHC 32.6 30.0 - 36.0 g/dL   RDW 87.5 (H) 64.3 - 32.9 %   Platelets 167 150 - 400 K/uL   nRBC 0.0 0.0 -  0.2 %    Comment: Performed at [facility], [address], [place], [place] [address]  Comprehensive metabolic panel     Status: Abnormal   Collection Time: 07/11/23  1:15 PM  Result Value Ref Range   Sodium 132 (L) 135 - 145 mmol/L   Potassium 3.7 3.5 - 5.1 mmol/L   Chloride 96 (L) 98 - 111 mmol/L   CO2 27 22 - 32 mmol/L   Glucose, Bld 96 70 - 99 mg/dL    Comment: Glucose reference range applies only to samples taken after fasting for at least 8 hours.   BUN 13 8 - 23 mg/dL   Creatinine, Ser 6.21 0.61 - 1.24 mg/dL   Calcium 8.0 (L) 8.9 - 10.3 mg/dL   Total Protein 6.2 (L) 6.5 - 8.1 g/dL   Albumin 2.2 (L) 3.5 - 5.0 g/dL   AST 29 15 - 41 U/L   ALT 36 0 - 44 U/L   Alkaline Phosphatase 157 (H) 38 - 126 U/L   Total Bilirubin 1.1 0.3 - 1.2 mg/dL   GFR, Estimated >30 >86 mL/min    Comment: (NOTE) Calculated using  the CKD-EPI Creatinine Equation (2021)    Anion gap 9 5 - 15    Comment: Performed at [facility], [address], [place], [place] [address]  Magnesium     Status: Abnormal   Collection Time: 07/11/23  1:15 PM  Result Value Ref Range   Magnesium 1.2 (L) 1.7 - 2.4 mg/dL    Comment: Performed at [facility], [address], [place], [place] [address]  Lactic acid, plasma     Status: None   Collection Time: 07/11/23  2:09 PM  Result Value Ref Range   Lactic Acid, Venous 0.7 0.5 - 1.9 mmol/L    Comment: Performed at [facility], [address], [place], [place] [address]   DG Chest Portable 1 View  Result Date: 07/11/2023 CLINICAL DATA:  Cough.  Recent pneumonia. EXAM: PORTABLE CHEST 1 VIEW COMPARISON:  AP chest 06/01/2023, 01/15/2011; CT chest 06/01/2023 FINDINGS: Right chest wall porta catheter with tip overlying the mid superior vena cava, unchanged from 06/01/2023. Cardiac silhouette and mediastinal contours are within normal limits. Mild interval increase in density of heterogeneous airspace opacification throughout the inferior 40% of the right lung. Mildly increased density of lower density heterogeneous airspace opacity throughout the inferior 50% of the left lung. This likely represents worsening of a similar distribution multifocal pneumonia seen on prior 06/01/2023 radiograph and CT. No large pleural effusion is seen.  No pneumothorax. No acute skeletal abnormality. IMPRESSION: Mild interval increase in density of heterogeneous airspace opacification throughout the inferior 40% of the right lung and inferior 50% of the left lung. This likely represents worsening of a similar distribution multifocal pneumonia seen on prior 06/01/2023 radiograph and CT. Electronically Signed   By: Neita Garnet M.D.   On: 07/11/2023 15:38   DG Hand 2 View Right  Result Date: 07/11/2023 CLINICAL DATA:  Right hand swelling and pain. EXAM: RIGHT HAND  - 2 VIEW COMPARISON:  None Available. FINDINGS: Pulse oximeter overlies the distal aspect of the index finger, obscuring the bones on frontal view. Normal alignment. Neutral ulnar variance. Mild dorsal carpometacarpal degenerative spurring, likely at the third digit. No acute fracture or dislocation. IMPRESSION: Mild dorsal carpometacarpal degenerative spurring, likely at the third digit. Electronically Signed   By: Neita Garnet M.D.   On: 07/11/2023 15:34   DG  Ankle 2 Views Right  Result Date: 07/11/2023 CLINICAL DATA:  Right foot swelling and pain. EXAM: RIGHT ANKLE - 2 VIEW COMPARISON:  Right ankle radiographs 02/11/2021 FINDINGS: Interval lateral plate and screw fixation of the prior distal fibular diaphyseal fracture with improved, now anatomic alignment complete healing. Single screw fixation of the prior medial malleolar fracture, with normal alignment and complete healing. 3 mm chronic ossicle medial to the medial malleolus. The ankle mortise is symmetric and intact. No acute fracture is seen. No dislocation. IMPRESSION: Compared to 02/11/2021: 1. Interval ORIF of the prior distal fibular diaphyseal and medial malleolar fractures with improved, now anatomic alignment and complete healing. 2. No acute fracture. Electronically Signed   By: Neita Garnet M.D.   On: 07/11/2023 15:32    Pending Labs Unresulted Labs (From admission, onward)     Start     Ordered   07/12/23 0500  Occult blood card to lab, stool  Daily,   R      07/11/23 1558   07/12/23 0500  Basic metabolic panel  Tomorrow morning,   R        07/11/23 1600   07/12/23 0500  CBC  Tomorrow morning,   R        07/11/23 1600   07/11/23 1353  Culture, blood (routine x 2)  BLOOD CULTURE X 2,   R (with STAT occurrences)      07/11/23 1352   07/11/23 1315  Differential  Once,   AD        07/11/23 1315            Vitals/Pain Today's Vitals   07/11/23 1340 07/11/23 1400 07/11/23 1500 07/11/23 1605  BP:  117/79 (!) 125/102    Pulse:  (!) 106 (!) 121 (!) 107  Resp:  20 (!) 24 18  Temp:    98.4 F (36.9 C)  TempSrc:      SpO2:  97% 96% 98%  Weight:      Height:      PainSc: 3        Isolation Precautions No active isolations  Medications Medications  ceFEPIme (MAXIPIME) 2 g in sodium chloride 0.9 % 100 mL IVPB (2 g Intravenous New Bag/Given 07/11/23 1601)  vancomycin (VANCOREADY) IVPB 1250 mg/250 mL (has no administration in time range)  oxyCODONE (Oxy IR/ROXICODONE) immediate release tablet 10 mg (has no administration in time range)  pantoprazole (PROTONIX) EC tablet 40 mg (has no administration in time range)  tamsulosin (FLOMAX) capsule 0.4 mg (has no administration in time range)  apixaban (ELIQUIS) tablet 5 mg (has no administration in time range)  ferrous sulfate tablet 325 mg (has no administration in time range)  arformoterol (BROVANA) nebulizer solution 15 mcg (has no administration in time range)    And  umeclidinium bromide (INCRUSE ELLIPTA) 62.5 MCG/ACT 1 puff (has no administration in time range)  acetaminophen (TYLENOL) tablet 650 mg (has no administration in time range)    Or  acetaminophen (TYLENOL) suppository 650 mg (has no administration in time range)  traZODone (DESYREL) tablet 25 mg (has no administration in time range)  ondansetron (ZOFRAN) tablet 4 mg (has no administration in time range)    Or  ondansetron (ZOFRAN) injection 4 mg (has no administration in time range)  albuterol (PROVENTIL) (2.5 MG/3ML) 0.083% nebulizer solution 2.5 mg (has no administration in time range)  sodium chloride 0.9 % bolus 1,000 mL (0 mLs Intravenous Stopped 07/11/23 1429)  HYDROmorphone (DILAUDID) injection 1 mg (1 mg Intravenous  Given 07/11/23 1319)  magnesium sulfate IVPB 2 g 50 mL (0 g Intravenous Stopped 07/11/23 1529)  HYDROmorphone (DILAUDID) injection 1 mg (1 mg Intravenous Given 07/11/23 1601)    Mobility walks with device

## 2023-07-11 NOTE — H&P (Signed)
History and Physical  Matthew Rivera UXL:[id number] DOB: 11/03/1960 DOA: 07/11/2023  PCP: [name], MD   Chief Complaint: Generalized pain and malaise  HPI: Matthew Rivera is a 62 y.o. male with medical history significant for deafness, GERD, non-small cell lung cancer with brain metastasis status post craniotomy as well as recent hospital stay for acute hypoxic respiratory failure due to combination of community-acquired pneumonia and pulmonary embolism who is now being admitted to the hospital with continued malaise, generalized pain and concern for sepsis from healthcare acquired pneumonia.  History is provided by the patient and his wife, they state that he has been doing decently well since being discharged from the hospital, he feels that maybe he has had a bit of an increased cough.  He last got chemotherapy 07/05/2023, as usual he was feeling a little bit unwell after this, however he failed to start to have more energy and better appetite as he normally does a few days after chemotherapy.  Due to continuing to feel unwell, he decided to come to the ER for evaluation.  He denies chest pain, fevers, vomiting.  ED Course: In the emergency department, he has been afebrile, persistently tachycardic, blood pressure 125/102, saturating 95 to 98% on his baseline 3 L nasal cannula oxygen.  Lab work was performed, WBC 1.0, hemoglobin 7.7 down from about 10.  Platelets are normal, CMP reveals sodium 132, normal renal function, alk phos of 157, lactate 0.7.  He was given 1 L normal saline bolus, IV Dilaudid for pain control, and started on IV cefepime and vancomycin after chest x-ray showed worsening bilateral consolidation.  Review of Systems: Please see HPI for pertinent positives and negatives. A complete 10 system review of systems are otherwise negative.  Past Medical History:  Diagnosis Date   Dyslipidemia    GERD (gastroesophageal reflux disease)    Lung nodule seen on imaging study 03/31/2023    seen on CT results   Osteoarthritis    Smoker    long term smoker 1 ppd   Past Surgical History:  Procedure Laterality Date   BACK SURGERY     x 1   COLONOSCOPY WITH PROPOFOL N/A 08/18/2020   Procedure: COLONOSCOPY WITH PROPOFOL;  Surgeon: Lanelle Bal, DO;  Location: [facility];  Service: Endoscopy;  Laterality: N/A;  7:30am   CRANIOTOMY Right 04/07/2023   Procedure: Right suboccipital craniotomy for tumor resection;  Surgeon: Tressie Stalker, MD;  Location: [facility] OR;  Service: Neurosurgery;  Laterality: Right;  TO follow   IR IMAGING GUIDED PORT INSERTION  05/12/2023   MULTIPLE TOOTH EXTRACTIONS     wears full dentures   POLYPECTOMY  08/18/2020   Procedure: POLYPECTOMY INTESTINAL;  Surgeon: Lanelle Bal, DO;  Location: [facility];  Service: Endoscopy;;    Social History:  reports that he has been smoking cigarettes. He started smoking about 52 years ago. He has a 78.4 pack-year smoking history. He has never used smokeless tobacco. He reports that he does not currently use alcohol. He reports that he does not use drugs.   No Known Allergies  Family History  Problem Relation Age of Onset   Diabetes Father    Asthma Father      Prior to Admission medications   Medication Sig Start Date End Date Taking? Authorizing Provider  albuterol (VENTOLIN HFA) 108 (90 Base) MCG/ACT inhaler Inhale 2 puffs into the lungs every 4 (four) hours as needed for wheezing or shortness of breath.   Yes  [provider]  apixaban (ELIQUIS) 5 MG TABS tablet Take 1 tablet (5 mg total) by mouth 2 (two) times daily. This prescription will not start until 9/20 so can place on hold Patient taking differently: Take 5 mg by mouth 2 (two) times daily. 07/01/23  Yes Dahal, Melina Schools, MD  ferrous sulfate 325 (65 FE) MG tablet Take 325 mg by mouth daily with breakfast.   Yes [provider]  folic acid (FOLVITE) 1 MG tablet Take 1 tablet (1 mg total) by mouth daily. 05/04/23  Yes  Heilingoetter, Cassandra L, PA-C  lidocaine-prilocaine (EMLA) cream Apply 1 Application topically as needed. Patient taking differently: Apply 1 Application topically as needed (for port access- every 21 days). 05/04/23  Yes Heilingoetter, Cassandra L, PA-C  LORazepam (ATIVAN) 0.5 MG tablet Take 1 tablet (0.5 mg total) by mouth as needed for anxiety. Take 1-2 tablet by mouth 20 minutes before MRI or radiation treatment. Patient taking differently: Take 0.5 mg by mouth See admin instructions. Take 0.5-1 mg (1-2 tablets) by mouth 20 minutes before MRI or radiation treatment 05/03/23  Yes Erven Colla, PA-C  ondansetron (ZOFRAN-ODT) 4 MG disintegrating tablet Take 1 tablet (4 mg total) by mouth every 8 (eight) hours as needed for nausea or vomiting. Patient taking differently: Take 4 mg by mouth every 8 (eight) hours as needed for nausea or vomiting (DISSOLVE ORALLY). 05/24/23  Yes Heilingoetter, Cassandra L, PA-C  Oxycodone HCl 10 MG TABS Take 10 mg by mouth every 4 (four) hours as needed (for pain).   Yes [provider]  pantoprazole (PROTONIX) 40 MG tablet Take 1 tablet (40 mg total) by mouth 2 (two) times daily. Patient taking differently: Take 40 mg by mouth 2 (two) times daily before a meal. 04/01/23  Yes Rhetta Mura, MD  prochlorperazine (COMPAZINE) 10 MG tablet Take 1 tablet (10 mg total) by mouth every 6 (six) hours as needed for nausea or vomiting. 06/15/23  Yes Heilingoetter, Cassandra L, PA-C  tamsulosin (FLOMAX) 0.4 MG CAPS capsule Take 0.4 mg by mouth at bedtime as needed (urinary flow issues). 03/01/23  Yes [provider]  Tiotropium Bromide-Olodaterol (STIOLTO RESPIMAT) 2.5-2.5 MCG/ACT AERS Inhale 2 puffs into the lungs daily. 05/10/23  Yes Charlott Holler, MD  traMADol (ULTRAM) 50 MG tablet Take 50-100 mg by mouth 2 (two) times daily as needed (for pain). 06/14/23  Yes [provider]  TYLENOL 500 MG tablet Take 500-1,000 mg by mouth every 6 (six) hours as  needed for mild pain or headache.   Yes [provider]  APIXABAN Everlene Balls) VTE STARTER PACK (10MG  AND 5MG ) Take as directed on package: start with two-5mg  tablets twice daily for 7 days. On day 8, switch to one-5mg  tablet twice daily. Patient not taking: Reported on 07/11/2023 06/04/23   Lorin Glass, MD  nicotine (NICODERM CQ - DOSED IN MG/24 HOURS) 14 mg/24hr patch Place 1 patch (14 mg total) onto the skin daily. Patient not taking: Reported on 07/11/2023 05/10/23   Charlott Holler, MD    Physical Exam: BP (!) 125/102   Pulse (!) 121   Temp 98.6 F (37 C) (Oral)   Resp (!) 24   Ht 5\' 11"  (1.803 m)   Wt 62 kg   SpO2 96%   BMI 19.06 kg/m   General:  Alert, oriented, calm, in no acute distress, somewhat emaciated, good historian, speaking in full sentences, wearing 3 L nasal cannula oxygen Eyes: EOMI, clear conjuctivae, white sclerea Neck: supple, no masses,  trachea mildline  Cardiovascular: RRR, no murmurs or rubs, no peripheral edema  Respiratory: Breath sounds are diminished globally particularly in the right base, there is some associated rhonchi, no wheezing Abdomen: soft, nontender, nondistended, normal bowel tones heard  Skin: dry, no rashes  Musculoskeletal: no joint effusions, normal range of motion  Psychiatric: appropriate affect, normal speech  Neurologic: extraocular muscles intact, clear speech, moving all extremities with intact sensorium         Labs on Admission:  Basic Metabolic Panel: Recent Labs  Lab 07/05/23 0820 07/11/23 1315  NA 136 132*  K 3.8 3.7  CL 103 96*  CO2 27 27  GLUCOSE 110* 96  BUN 12 13  CREATININE 0.81 0.69  CALCIUM 8.0* 8.0*  MG  --  1.2*   Liver Function Tests: Recent Labs  Lab 07/05/23 0820 07/11/23 1315  AST 16 29  ALT 11 36  ALKPHOS 104 157*  BILITOT 0.5 1.1  PROT 6.2* 6.2*  ALBUMIN 2.7* 2.2*   No results for input(s): "LIPASE", "AMYLASE" in the last 168 hours. No results for input(s): "AMMONIA" in the last 168  hours. CBC: Recent Labs  Lab 07/05/23 0820 07/11/23 1315  WBC 4.6 1.0*  NEUTROABS 2.9  --   HGB 9.8* 7.7*  HCT 29.0* 23.6*  MCV 94.8 93.7  PLT 183 167   Cardiac Enzymes: No results for input(s): "CKTOTAL", "CKMB", "CKMBINDEX", "TROPONINI" in the last 168 hours.  BNP (last 3 results) No results for input(s): "BNP" in the last 8760 hours.  ProBNP (last 3 results) No results for input(s): "PROBNP" in the last 8760 hours.  CBG: No results for input(s): "GLUCAP" in the last 168 hours.  Radiological Exams on Admission: DG Chest Portable 1 View  Result Date: 07/11/2023 CLINICAL DATA:  Cough.  Recent pneumonia. EXAM: PORTABLE CHEST 1 VIEW COMPARISON:  AP chest 06/01/2023, 01/15/2011; CT chest 06/01/2023 FINDINGS: Right chest wall porta catheter with tip overlying the mid superior vena cava, unchanged from 06/01/2023. Cardiac silhouette and mediastinal contours are within normal limits. Mild interval increase in density of heterogeneous airspace opacification throughout the inferior 40% of the right lung. Mildly increased density of lower density heterogeneous airspace opacity throughout the inferior 50% of the left lung. This likely represents worsening of a similar distribution multifocal pneumonia seen on prior 06/01/2023 radiograph and CT. No large pleural effusion is seen.  No pneumothorax. No acute skeletal abnormality. IMPRESSION: Mild interval increase in density of heterogeneous airspace opacification throughout the inferior 40% of the right lung and inferior 50% of the left lung. This likely represents worsening of a similar distribution multifocal pneumonia seen on prior 06/01/2023 radiograph and CT. Electronically Signed   By: Neita Garnet M.D.   On: 07/11/2023 15:38   DG Hand 2 View Right  Result Date: 07/11/2023 CLINICAL DATA:  Right hand swelling and pain. EXAM: RIGHT HAND - 2 VIEW COMPARISON:  None Available. FINDINGS: Pulse oximeter overlies the distal aspect of the index  finger, obscuring the bones on frontal view. Normal alignment. Neutral ulnar variance. Mild dorsal carpometacarpal degenerative spurring, likely at the third digit. No acute fracture or dislocation. IMPRESSION: Mild dorsal carpometacarpal degenerative spurring, likely at the third digit. Electronically Signed   By: Neita Garnet M.D.   On: 07/11/2023 15:34   DG Ankle 2 Views Right  Result Date: 07/11/2023 CLINICAL DATA:  Right foot swelling and pain. EXAM: RIGHT ANKLE - 2 VIEW COMPARISON:  Right ankle radiographs 02/11/2021 FINDINGS: Interval lateral plate and screw fixation of  the prior distal fibular diaphyseal fracture with improved, now anatomic alignment complete healing. Single screw fixation of the prior medial malleolar fracture, with normal alignment and complete healing. 3 mm chronic ossicle medial to the medial malleolus. The ankle mortise is symmetric and intact. No acute fracture is seen. No dislocation. IMPRESSION: Compared to 02/11/2021: 1. Interval ORIF of the prior distal fibular diaphyseal and medial malleolar fractures with improved, now anatomic alignment and complete healing. 2. No acute fracture. Electronically Signed   By: Neita Garnet M.D.   On: 07/11/2023 15:32    Assessment/Plan Matthew Rivera is a 62 y.o. male with medical history significant for deafness, GERD, non-small cell lung cancer with brain metastasis status post craniotomy as well as recent hospital stay for acute hypoxic respiratory failure due to combination of community-acquired pneumonia and pulmonary embolism who is now being admitted to the hospital with continued malaise, generalized pain and concern for sepsis from healthcare acquired pneumonia.  Sepsis-meeting criteria with tachycardia, tachypnea, source is suspected healthcare associated pneumonia.  Lactic acid level is normal.  He has some persistent tachycardia, likely also exacerbated by his back pain. -Inpatient admission to progressive -Continue  supplemental oxygen -Follow-up blood cultures -Continue empiric IV vancomycin and IV cefepime  HCAP-patient was admitted to the hospital with community-acquired pneumonia and associated septic emboli in August 2024.  At the time, he was treated with empiric IV azithromycin/Rocephin, and discharged home to complete a course of Augmentin.  Neutropenia-likely due to his chemotherapy  Acute on chronic anemia-unclear etiology, likely anemia of chronic disease and exacerbated by his chemotherapy.  Denies any abdominal pain, evidence of GI bleed. -Check stool guaiac -Monitor closely with daily labs  History of septic emboli started on Eliquis to August 2024-CT chest at the time showed irregular consolidation as well as thick-walled cavitary lesions felt to be consistent with multifocal pneumonia and concurrent septic emboli. -Continue Eliquis, monitor hemoglobin closely  Non-small cell lung cancer with brain metastasis, status post craniotomy 6/30 and intracranial radiation.  Ongoing chemotherapy under the care of Dr. Arbutus Ped, last on 07/05/2023. -Dr. Arbutus Ped added to treatment team -Continue MS Contin 15 mg p.o. twice daily and as needed morphine  Hyponatremia-mild and no concerning symptoms, previously felt to be SIADH and improved with fluid restriction -Will initiate gentle fluid restriction -Monitor sodium level every 12 hours  Iron deficiency anemia-continue iron supplementation  BPH-Flomax  DVT prophylaxis: Eliquis    Code Status: Full Code  Consults called: None  Admission status: The appropriate patient status for this patient is INPATIENT. Inpatient status is judged to be reasonable and necessary in order to provide the required intensity of service to ensure the patient's safety. The patient's presenting symptoms, physical exam findings, and initial radiographic and laboratory data in the context of their chronic comorbidities is felt to place them at high risk for further  clinical deterioration. Furthermore, it is not anticipated that the patient will be medically stable for discharge from the hospital within 2 midnights of admission.    I certify that at the point of admission it is my clinical judgment that the patient will require inpatient hospital care spanning beyond 2 midnights from the point of admission due to high intensity of service, high risk for further deterioration and high frequency of surveillance required  Time spent: 59 minutes  Norena Bratton Sharlette Dense MD [facility] Pager [phone]  If 7PM-7AM, please contact night-coverage [email or web address] Password [email or web address]  07/11/2023, 4:00 PM

## 2023-07-11 NOTE — Plan of Care (Signed)
  Problem: Clinical Measurements: Goal: Usual level of consciousness will be regained or maintained. Outcome: Progressing Goal: Neurologic status will improve Outcome: Progressing Goal: Ability to maintain intracranial pressure will improve Outcome: Progressing   Problem: Skin Integrity: Goal: Demonstration of wound healing without infection will improve Outcome: Progressing   Problem: Education: Goal: Knowledge of General Education information will improve Description: Including pain rating scale, medication(s)/side effects and non-pharmacologic comfort measures Outcome: Progressing   Problem: Clinical Measurements: Goal: Ability to maintain clinical measurements within normal limits will improve Outcome: Progressing Goal: Will remain free from infection Outcome: Progressing Goal: Diagnostic test results will improve Outcome: Progressing Goal: Respiratory complications will improve Outcome: Progressing Goal: Cardiovascular complication will be avoided Outcome: Progressing

## 2023-07-11 NOTE — Progress Notes (Signed)
A consult was received from an ED physician for cefepime & vancomycin  per pharmacy dosing.  The patient's profile has been reviewed for ht/wt/allergies/indication/available labs.     A one time order has been placed for cefepime 2 gm & vancomycin 1250 mg.    Further antibiotics/pharmacy consults should be ordered by admitting physician if indicated.                       Thank you,  Herby Abraham, Pharm.D Use secure chat for questions 07/11/2023 3:49 PM

## 2023-07-11 NOTE — Progress Notes (Signed)
DIAGNOSIS:  Stage IV (T2b, N2, M1b) non-small cell lung cancer, adenocarcinoma. He presented a large central left lung mass in addition to left hilar and mediastinal lymphadenopathy as well as additional lung lesion in the right lower lobe and solitary brain metastasis involving the inferior lateral right cerebellar hemisphere. He was diagnosed in June 2024.   PDL1 Expression 80%    Molecular studies: Positive for KRAS G12C mutation which can be used in the second line setting   PRIOR THERAPY: 1) status post craniotomy and surgical resection on April 07, 2023 under the care of [name]  2) Radiation to the brain cavity by Dr. Basilio Cairo.  Plan is for 3 treatments    CURRENT THERAPY: Systemic chemotherapy with carboplatin for an AUC of 5, Alimta 500 mg/m, and Keytruda 200 mg IV every 3 weeks.  First dose on 05/16/2023. Status post 3 cycle. He had significant intolerance to chemotherapy with cycle #1. Therefore, we will reduce his dose of carboplatin to an AUC of 4 and Alimta 400 mg/m2 starting from cycle #2.   Subjective: The patient, a 62 year old individual with stage four non-small cell lung cancer, presented to the emergency room with complaints of worsening symptoms following his most recent chemotherapy session. Typically, the patient experiences a few good days post-chemotherapy, followed by a few days of feeling unwell, before his condition stabilizes. However, this time, the patient reported feeling unwell the morning after chemotherapy, with symptoms progressively worsening over the next five days.  The patient experienced significant discomfort in his right ankle, right knee, and the back of his right hand. The right ankle and knee, both of which had undergone surgery in the past, were swollen and painful, making it difficult for the patient to walk. The back of his right hand was slightly swollen as well. The patient initially attributed these symptoms to the effects of chemotherapy and  expected them to subside after a few days. However, the symptoms continued to worsen, prompting the patient to seek emergency care.  In addition to the pain and swelling, the patient reported a general feeling of weakness, particularly in the legs. He denied experiencing any dizziness or syncope. The patient also reported a persistent cough that caused discomfort in the right side of the chest and a feeling of tightness. The cough produced clear to slightly yellow phlegm. The patient denied any hemoptysis.  The patient also reported changes in his urinary habits. Despite increased fluid intake, the patient noted a decrease in urine output. He denied any urinary discomfort or signs of urinary tract infection. The patient did not report any gastrointestinal symptoms such as nausea, vomiting, or diarrhea.  Objective: Vital signs in last 24 hours: Temp:  [98.4 F (36.9 C)-99.5 F (37.5 C)] 99.5 F (37.5 C) (09/30 1733) Pulse Rate:  [105-122] 111 (09/30 1733) Resp:  [17-24] 24 (09/30 1733) BP: (111-142)/(71-102) 120/71 (09/30 1733) SpO2:  [95 %-99 %] 99 % (09/30 1733) Weight:  [136 lb 11 oz (62 kg)] 136 lb 11 oz (62 kg) (09/30 1208)  Intake/Output from previous day: No intake/output data recorded. Intake/Output this shift: Total I/O In: 250 [IV Piggyback:250] Out: -   General appearance: alert, cooperative, fatigued, and no distress Resp: clear to auscultation bilaterally Cardio: regular rate and rhythm, S1, S2 normal, no murmur, click, rub or gallop GI: soft, non-tender; bowel sounds normal; no masses,  no organomegaly Extremities: extremities normal, atraumatic, no cyanosis or edema  Lab Results:  Recent Labs    07/11/23 1315  WBC  1.0*  HGB 7.7*  HCT 23.6*  PLT 167   BMET Recent Labs    07/11/23 1315  NA 132*  K 3.7  CL 96*  CO2 27  GLUCOSE 96  BUN 13  CREATININE 0.69  CALCIUM 8.0*    Studies/Results: DG Chest Portable 1 View  Result Date: 07/11/2023 CLINICAL  DATA:  Cough.  Recent pneumonia. EXAM: PORTABLE CHEST 1 VIEW COMPARISON:  AP chest 06/01/2023, 01/15/2011; CT chest 06/01/2023 FINDINGS: Right chest wall porta catheter with tip overlying the mid superior vena cava, unchanged from 06/01/2023. Cardiac silhouette and mediastinal contours are within normal limits. Mild interval increase in density of heterogeneous airspace opacification throughout the inferior 40% of the right lung. Mildly increased density of lower density heterogeneous airspace opacity throughout the inferior 50% of the left lung. This likely represents worsening of a similar distribution multifocal pneumonia seen on prior 06/01/2023 radiograph and CT. No large pleural effusion is seen.  No pneumothorax. No acute skeletal abnormality. IMPRESSION: Mild interval increase in density of heterogeneous airspace opacification throughout the inferior 40% of the right lung and inferior 50% of the left lung. This likely represents worsening of a similar distribution multifocal pneumonia seen on prior 06/01/2023 radiograph and CT. Electronically Signed   By: Neita Garnet M.D.   On: 07/11/2023 15:38   DG Hand 2 View Right  Result Date: 07/11/2023 CLINICAL DATA:  Right hand swelling and pain. EXAM: RIGHT HAND - 2 VIEW COMPARISON:  None Available. FINDINGS: Pulse oximeter overlies the distal aspect of the index finger, obscuring the bones on frontal view. Normal alignment. Neutral ulnar variance. Mild dorsal carpometacarpal degenerative spurring, likely at the third digit. No acute fracture or dislocation. IMPRESSION: Mild dorsal carpometacarpal degenerative spurring, likely at the third digit. Electronically Signed   By: Neita Garnet M.D.   On: 07/11/2023 15:34   DG Ankle 2 Views Right  Result Date: 07/11/2023 CLINICAL DATA:  Right foot swelling and pain. EXAM: RIGHT ANKLE - 2 VIEW COMPARISON:  Right ankle radiographs 02/11/2021 FINDINGS: Interval lateral plate and screw fixation of the prior distal  fibular diaphyseal fracture with improved, now anatomic alignment complete healing. Single screw fixation of the prior medial malleolar fracture, with normal alignment and complete healing. 3 mm chronic ossicle medial to the medial malleolus. The ankle mortise is symmetric and intact. No acute fracture is seen. No dislocation. IMPRESSION: Compared to 02/11/2021: 1. Interval ORIF of the prior distal fibular diaphyseal and medial malleolar fractures with improved, now anatomic alignment and complete healing. 2. No acute fracture. Electronically Signed   By: Neita Garnet M.D.   On: 07/11/2023 15:32    Medications: I have reviewed the patient's current medications.   Assessment/Plan: This is a very pleasant 62 years old white male with a stage IV non-small cell lung cancer, adenocarcinoma diagnosed in June 2024 presented with large central left lung mass in addition to left hilar and mediastinal lymphadenopathy as well as additional lung lesion in the right lower lobe and solitary brain metastasis.  He has positive KRAS G12C mutation as well as PD-L1 expression of 80%.  The patient is status post craniotomy with surgical resection of the brain tumor followed by SRS to the tumor cavity.  He is currently undergoing systemic chemotherapy with carboplatin, Alimta and Keytruda status post 3 cycles.   Stage IV Non-Small Cell Lung Cancer Patient on Carboplatin, Alimta, and Keytruda. Considering modifying treatment to [facility] alone in the future. - Hold current chemotherapy regimen until further discussion in  outpatient setting.  Suspected Pneumonia Low white blood cell count and neutropenia likely secondary to chemotherapy. No fever but reports of cough and chest tightness. -Start Cefepime and Vancomycin for broad coverage. -Administer Granix subcutaneously daily to boost white blood cell count.  Chemotherapy-Induced Neutropenia Low white blood cell count likely secondary to  chemotherapy. -Administer Granix subcutaneously daily until count recovers.  Chemotherapy-Induced Anemia Low hemoglobin likely secondary to chemotherapy. -Monitor hemoglobin levels and consider PRBC transfusion if necessary.  General Health Maintenance / Followup Plans -Admit for inpatient management of suspected pneumonia and neutropenia. -Follow-up in the office after hospital discharge.    LOS: 0 days    Lajuana Matte 07/11/2023

## 2023-07-11 NOTE — Progress Notes (Signed)
Pharmacy Antibiotic Note  Matthew Rivera is a 62 y.o. male admitted on 07/11/2023 with  sepsis, HCAP .  Pharmacy has been consulted for Vanco, Cefepime dosing.  Active Problem(s): hand and feet swelling, malaise, pain all over, decreased appetite - Reports chemo for his lung cancer cancer this past Tuesday   PMH: HLD, GERD, lung nodule, OA, tobacco, 3L baseline O2, stage IV metastatic lung cancer currently on chemotherapy   ID: PNA, neutropenic d/t chemo - Afebrile, WBC 1, Scr <1  9/30 cefepime>> 9/30 vanc 1250>>  9/30 BCx2:   Plan: Cefepime 2g IV q8hr Vanco 1250mg  IV x 1 Vancomycin 750 mg IV Q 12 hrs. Goal AUC 400-550. Expected AUC: 543 SCr used: 0.8    Height: 5\' 11"  (180.3 cm) Weight: 62 kg (136 lb 11 oz) IBW/kg (Calculated) : 75.3  Temp (24hrs), Avg:98.5 F (36.9 C), Min:98.4 F (36.9 C), Max:98.6 F (37 C)  Recent Labs  Lab 07/05/23 0820 07/11/23 1315 07/11/23 1409  WBC 4.6 1.0*  --   CREATININE 0.81 0.69  --   LATICACIDVEN  --   --  0.7    Estimated Creatinine Clearance: 84 mL/min (by C-G formula based on SCr of 0.69 mg/dL).    No Known Allergies  [place] Lesure S. Merilynn Finland, PharmD, BCPS Clinical Staff Pharmacist [email or web address]  Pasty Spillers 07/11/2023 4:29 PM

## 2023-07-11 NOTE — ED Triage Notes (Signed)
Patient has had right foot and bilateral hand swelling for a week. Complaining of pain all over his body. Has stage 4 lung cancer and is getting chemo.

## 2023-07-12 ENCOUNTER — Inpatient Hospital Stay: Payer: 59 | Attending: Internal Medicine

## 2023-07-12 DIAGNOSIS — D701 Agranulocytosis secondary to cancer chemotherapy: Secondary | ICD-10-CM | POA: Diagnosis present

## 2023-07-12 DIAGNOSIS — I2699 Other pulmonary embolism without acute cor pulmonale: Secondary | ICD-10-CM | POA: Diagnosis not present

## 2023-07-12 DIAGNOSIS — D709 Neutropenia, unspecified: Secondary | ICD-10-CM | POA: Diagnosis present

## 2023-07-12 DIAGNOSIS — A419 Sepsis, unspecified organism: Secondary | ICD-10-CM | POA: Diagnosis present

## 2023-07-12 DIAGNOSIS — T451X5A Adverse effect of antineoplastic and immunosuppressive drugs, initial encounter: Secondary | ICD-10-CM | POA: Diagnosis present

## 2023-07-12 DIAGNOSIS — K219 Gastro-esophageal reflux disease without esophagitis: Secondary | ICD-10-CM

## 2023-07-12 DIAGNOSIS — D6481 Anemia due to antineoplastic chemotherapy: Secondary | ICD-10-CM | POA: Diagnosis present

## 2023-07-12 DIAGNOSIS — J189 Pneumonia, unspecified organism: Secondary | ICD-10-CM | POA: Diagnosis not present

## 2023-07-12 LAB — URINALYSIS, COMPLETE (UACMP) WITH MICROSCOPIC
Bilirubin Urine: NEGATIVE
Glucose, UA: NEGATIVE mg/dL
Hgb urine dipstick: NEGATIVE
Ketones, ur: NEGATIVE mg/dL
Leukocytes,Ua: NEGATIVE
Nitrite: NEGATIVE
Protein, ur: NEGATIVE mg/dL
Specific Gravity, Urine: 1.015 (ref 1.005–1.030)
pH: 5 (ref 5.0–8.0)

## 2023-07-12 LAB — FERRITIN: Ferritin: 2410 ng/mL — ABNORMAL HIGH (ref 24–336)

## 2023-07-12 LAB — EXPECTORATED SPUTUM ASSESSMENT W GRAM STAIN, RFLX TO RESP C

## 2023-07-12 LAB — IRON AND TIBC
Iron: 43 ug/dL — ABNORMAL LOW (ref 45–182)
Saturation Ratios: 35 % (ref 17.9–39.5)
TIBC: 125 ug/dL — ABNORMAL LOW (ref 250–450)
UIBC: 82 ug/dL

## 2023-07-12 LAB — CBC
HCT: 21.2 % — ABNORMAL LOW (ref 39.0–52.0)
Hemoglobin: 7 g/dL — ABNORMAL LOW (ref 13.0–17.0)
MCH: 31 pg (ref 26.0–34.0)
MCHC: 33 g/dL (ref 30.0–36.0)
MCV: 93.8 fL (ref 80.0–100.0)
Platelets: 165 10*3/uL (ref 150–400)
RBC: 2.26 MIL/uL — ABNORMAL LOW (ref 4.22–5.81)
RDW: 17 % — ABNORMAL HIGH (ref 11.5–15.5)
WBC: 1.1 10*3/uL — CL (ref 4.0–10.5)
nRBC: 0 % (ref 0.0–0.2)

## 2023-07-12 LAB — BASIC METABOLIC PANEL
Anion gap: 8 (ref 5–15)
BUN: 12 mg/dL (ref 8–23)
CO2: 24 mmol/L (ref 22–32)
Calcium: 7.7 mg/dL — ABNORMAL LOW (ref 8.9–10.3)
Chloride: 96 mmol/L — ABNORMAL LOW (ref 98–111)
Creatinine, Ser: 0.86 mg/dL (ref 0.61–1.24)
GFR, Estimated: >60 mL/min (ref >60)
Glucose, Bld: 97 mg/dL (ref 70–99)
Potassium: 3.4 mmol/L — ABNORMAL LOW (ref 3.5–5.1)
Sodium: 128 mmol/L — ABNORMAL LOW (ref 135–145)

## 2023-07-12 LAB — RESP PANEL BY RT-PCR (RSV, FLU A&B, COVID)  RVPGX2
Influenza A by PCR: NEGATIVE
Influenza B by PCR: NEGATIVE
Resp Syncytial Virus by PCR: NEGATIVE
SARS Coronavirus 2 by RT PCR: NEGATIVE

## 2023-07-12 LAB — VITAMIN B12: Vitamin B-12: 763 pg/mL (ref 180–914)

## 2023-07-12 LAB — STREP PNEUMONIAE URINARY ANTIGEN: Strep Pneumo Urinary Antigen: NEGATIVE

## 2023-07-12 LAB — PREPARE RBC (CROSSMATCH)

## 2023-07-12 LAB — MAGNESIUM: Magnesium: 1.4 mg/dL — ABNORMAL LOW (ref 1.7–2.4)

## 2023-07-12 LAB — HEMOGLOBIN AND HEMATOCRIT, BLOOD
HCT: 20.2 % — ABNORMAL LOW (ref 39.0–52.0)
HCT: 26 % — ABNORMAL LOW (ref 39.0–52.0)
Hemoglobin: 6.6 g/dL — CL (ref 13.0–17.0)
Hemoglobin: 8.7 g/dL — ABNORMAL LOW (ref 13.0–17.0)

## 2023-07-12 LAB — CREATININE, URINE, RANDOM: Creatinine, Urine: 44 mg/dL

## 2023-07-12 LAB — SODIUM, URINE, RANDOM: Sodium, Ur: 115 mmol/L

## 2023-07-12 LAB — HIV ANTIBODY (ROUTINE TESTING W REFLEX): HIV Screen 4th Generation wRfx: NONREACTIVE

## 2023-07-12 LAB — FOLATE: Folate: 24.1 ng/mL (ref >5.9)

## 2023-07-12 MED ORDER — LEVALBUTEROL HCL 0.63 MG/3ML IN NEBU
0.6300 mg | INHALATION_SOLUTION | Freq: Two times a day (BID) | RESPIRATORY_TRACT | Status: DC
  Administered 2023-07-12 – 2023-07-13 (×2): 0.63 mg via RESPIRATORY_TRACT
  Filled 2023-07-12 (×2): qty 3

## 2023-07-12 MED ORDER — LEVALBUTEROL HCL 0.63 MG/3ML IN NEBU: 0.6300 mg | INHALATION_SOLUTION | Freq: Four times a day (QID) | RESPIRATORY_TRACT | Status: DC

## 2023-07-12 MED ORDER — POTASSIUM CHLORIDE CRYS ER 20 MEQ PO TBCR
40.0000 meq | EXTENDED_RELEASE_TABLET | Freq: Once | ORAL | Status: AC
  Administered 2023-07-12: 40 meq via ORAL
  Filled 2023-07-12: qty 2

## 2023-07-12 MED ORDER — SODIUM CHLORIDE 0.9 % IV SOLN: INTRAVENOUS | Status: DC

## 2023-07-12 MED ORDER — MAGNESIUM SULFATE 4 GM/100ML IV SOLN
4.0000 g | Freq: Once | INTRAVENOUS | Status: AC
  Administered 2023-07-12: 4 g via INTRAVENOUS
  Filled 2023-07-12: qty 100

## 2023-07-12 MED ORDER — BUDESONIDE 0.5 MG/2ML IN SUSP: 0.5000 mg | Freq: Two times a day (BID) | RESPIRATORY_TRACT | Status: DC

## 2023-07-12 MED ORDER — BUDESONIDE 0.5 MG/2ML IN SUSP
0.5000 mg | Freq: Two times a day (BID) | RESPIRATORY_TRACT | Status: DC
  Administered 2023-07-12 – 2023-07-14 (×4): 0.5 mg via RESPIRATORY_TRACT
  Filled 2023-07-12 (×4): qty 2

## 2023-07-12 MED ORDER — ACETAMINOPHEN 325 MG PO TABS
650.0000 mg | ORAL_TABLET | Freq: Once | ORAL | Status: AC
  Administered 2023-07-12: 650 mg via ORAL
  Filled 2023-07-12: qty 2

## 2023-07-12 MED ORDER — IPRATROPIUM BROMIDE 0.02 % IN SOLN: 0.5000 mg | Freq: Four times a day (QID) | RESPIRATORY_TRACT | Status: DC

## 2023-07-12 MED ORDER — IPRATROPIUM BROMIDE 0.02 % IN SOLN
0.5000 mg | Freq: Two times a day (BID) | RESPIRATORY_TRACT | Status: DC
  Administered 2023-07-12 – 2023-07-13 (×2): 0.5 mg via RESPIRATORY_TRACT
  Filled 2023-07-12 (×2): qty 2.5

## 2023-07-12 MED ORDER — LORATADINE 10 MG PO TABS
10.0000 mg | ORAL_TABLET | Freq: Every day | ORAL | Status: DC
  Administered 2023-07-12 – 2023-07-14 (×3): 10 mg via ORAL
  Filled 2023-07-12 (×3): qty 1

## 2023-07-12 MED ORDER — TBO-FILGRASTIM 480 MCG/0.8ML ~~LOC~~ SOSY
480.0000 ug | PREFILLED_SYRINGE | Freq: Every day | SUBCUTANEOUS | Status: DC
  Administered 2023-07-12 – 2023-07-14 (×3): 480 ug via SUBCUTANEOUS
  Filled 2023-07-12 (×3): qty 0.8

## 2023-07-12 MED ORDER — SODIUM CHLORIDE 0.9% IV SOLUTION: Freq: Once | INTRAVENOUS | Status: AC

## 2023-07-12 MED ORDER — DIPHENHYDRAMINE HCL 25 MG PO CAPS
25.0000 mg | ORAL_CAPSULE | Freq: Once | ORAL | Status: AC
  Administered 2023-07-12: 25 mg via ORAL
  Filled 2023-07-12: qty 1

## 2023-07-12 MED ORDER — GUAIFENESIN ER 600 MG PO TB12
1200.0000 mg | ORAL_TABLET | Freq: Two times a day (BID) | ORAL | Status: DC
  Administered 2023-07-12 – 2023-07-14 (×5): 1200 mg via ORAL
  Filled 2023-07-12 (×5): qty 2

## 2023-07-12 MED ORDER — PROCHLORPERAZINE EDISYLATE 10 MG/2ML IJ SOLN
10.0000 mg | Freq: Four times a day (QID) | INTRAMUSCULAR | Status: DC | PRN
  Administered 2023-07-12 – 2023-07-14 (×4): 10 mg via INTRAVENOUS
  Filled 2023-07-12 (×4): qty 2

## 2023-07-12 MED ORDER — FLUTICASONE PROPIONATE 50 MCG/ACT NA SUSP
2.0000 | Freq: Every day | NASAL | Status: DC
  Administered 2023-07-13 – 2023-07-14 (×2): 2 via NASAL
  Filled 2023-07-12: qty 16

## 2023-07-12 NOTE — TOC Initial Note (Signed)
Transition of Care [facility]) - Initial/Assessment Note    Patient Details  Name: DENZALE MOYA MRN: [id number] Date of Birth: [birth year]  [facility] CM/SW Contact:    Lanier Clam, RN Phone Number: 07/12/2023, 1:53 PM  Clinical Narrative:   Monitor for d/c plans.                Expected Discharge Plan: Home/Self Care Barriers to Discharge: Continued Medical Work up   Patient Goals and CMS Choice Patient states their goals for this hospitalization and ongoing recovery are:: Home CMS Medicare.gov Compare Post Acute Care list provided to:: Patient Choice offered to / list presented to : Patient [facility] ownership interest in [facility].provided to:: Patient    Expected Discharge Plan and Services   Discharge Planning Services: CM Consult   Living arrangements for the past 2 months: Single Family Home                                      Prior Living Arrangements/Services Living arrangements for the past 2 months: Single Family Home Lives with:: Spouse                   Activities of Daily Living   ADL Screening (condition at time of admission) Does the patient have a NEW difficulty with bathing/dressing/toileting/self-feeding that is expected to last >3 days?: Yes (Initiates electronic notice to provider for possible OT consult) Does the patient have a NEW difficulty with getting in/out of bed, walking, or climbing stairs that is expected to last >3 days?: No Does the patient have a NEW difficulty with communication that is expected to last >3 days?: No Is the patient deaf or have difficulty hearing?: No Does the patient have difficulty seeing, even when wearing glasses/contacts?: No Does the patient have difficulty concentrating, remembering, or making decisions?: No  Permission Sought/Granted                  Emotional Assessment              Admission diagnosis:  HCAP (healthcare-associated pneumonia)  [J18.9] Pneumonia of lower lobe due to infectious organism, unspecified laterality [J18.9] Patient Active Problem List   Diagnosis Date Noted   Hypomagnesemia 07/12/2023   Anemia associated with chemotherapy 07/12/2023   Neutropenia (HCC) 07/12/2023   HCAP (healthcare-associated pneumonia) 07/11/2023   Encounter for antineoplastic immunotherapy 07/05/2023   Secondary cancer of brain (HCC) 06/17/2023   Encounter for antineoplastic chemotherapy 06/15/2023   Palliative care by specialist 06/04/2023   Goals of care, counseling/discussion 06/04/2023   General weakness 06/04/2023   Acute pulmonary embolism (HCC) 06/01/2023   Port-A-Cath in place 05/16/2023   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  [place] [address] Phone: [phone] Fax: [phone]     Social Determinants of Health (SDOH) Social History: SDOH Screenings   Food Insecurity: No Food Insecurity (07/11/2023)  Housing: Low Risk  (07/11/2023)  Transportation Needs: No Transportation Needs (07/11/2023)  Utilities: Not At Risk (07/11/2023)  Alcohol Screen: Low Risk  (05/03/2023)  Depression (PHQ2-9): Low Risk  (05/02/2023)  Tobacco Use: High Risk (07/11/2023)   SDOH Interventions:     Readmission Risk Interventions    06/03/2023    1:54 PM  Readmission Risk Prevention Plan  Transportation Screening Complete  PCP or Specialist Appt within 3-5 Days Complete  HRI or Home Care Consult Complete  Social Work Consult for Recovery Care Planning/Counseling Complete  Palliative Care Screening Not  Complete  Palliative Care Screening Not Complete Comments MD has orderes palliative for GOC  Medication Review Oceanographer) Complete

## 2023-07-12 NOTE — Progress Notes (Signed)
Attempted to see pt. Pt with hgb of 6.6 and getting ready to receive blood.  Will check back as able.  Matthew Rivera, [place] [phone]

## 2023-07-12 NOTE — Plan of Care (Signed)
  Problem: Clinical Measurements: Goal: Usual level of consciousness will be regained or maintained. Outcome: Progressing Goal: Neurologic status will improve Outcome: Progressing Goal: Ability to maintain intracranial pressure will improve Outcome: Progressing   Problem: Skin Integrity: Goal: Demonstration of wound healing without infection will improve Outcome: Progressing

## 2023-07-12 NOTE — Plan of Care (Signed)
  Problem: Education: Goal: Knowledge of General Education information will improve Description: Including pain rating scale, medication(s)/side effects and non-pharmacologic comfort measures Outcome: Progressing   Problem: Coping: Goal: Level of anxiety will decrease Outcome: Progressing   Problem: Pain Managment: Goal: General experience of comfort will improve Outcome: Progressing   Problem: Clinical Measurements: Goal: Signs and symptoms of infection will decrease Outcome: Progressing   Problem: Respiratory: Goal: Ability to maintain adequate ventilation will improve Outcome: Progressing

## 2023-07-12 NOTE — Progress Notes (Signed)
PROGRESS NOTE    Matthew Rivera  AVW:[id number] DOB: June 03, 1961 DOA: 07/11/2023 PCP: [name], MD    Chief Complaint  Patient presents with   Feet and Hand Swelling    Brief Narrative:  Patient is a pleasant unfortunate 62 year old gentleman history of deafness, GERD, non-small cell lung cancer with brain mets status postcraniotomy as well as recent hospitalization for acute hypoxic respiratory failure secondary to community-acquired pneumonia and PE presented to the ED with continued malaise, generalized pain, concern for sepsis from HCAP.  Patient noted to have had chemotherapy 07/05/2023 however failed to start to improve in terms of his energy and appetite like he usually does post chemotherapy.  Patient presented to the ED, chest x-ray done showed a worsening bilateral consolidation/infiltrate.  Patient admitted, placed empirically on IV antibiotics.   Assessment & Plan:   Principal Problem:   Sepsis (HCC) Active Problems:   HCAP (healthcare-associated pneumonia)   GERD (gastroesophageal reflux disease)   Acute pulmonary embolism (HCC)   Hypomagnesemia   Anemia associated with chemotherapy   Neutropenia (HCC)   Chemotherapy induced neutropenia (HCC)  #1 sepsis secondary to HCAP, POA -Patient presenting with concern for sepsis meeting criteria with tachycardia, tachypnea, chest x-ray with concern for bilateral consolidation/infiltrate.  Lactic acid level noted to be normal. -Patient noted to have presented with generalized weakness and decreased oral intake. -Urine strep pneumococcus antigen negative. -Urine Legionella antigen pending. -Check a sputum Gram stain and culture. -SARS coronavirus 2 PCR negative, influenza A and B PCR negative, RSV PCR negative. -Blood cultures pending. -Continue empiric IV vancomycin and IV cefepime. -Continue Brovana.-Discontinue Incruse and place on Pulmicort. -Place on Mucinex, scheduled Xopenex and Atrovent nebs, Claritin,  PPI. -Supportive care.  2.  Neutropenia/iron deficiency anemia anemia -Patient denies any overt bleeding. -Likely secondary to chemotherapy. -Continue Granix. -Patient pancultured. -Continue empiric IV antibiotics secondary to problem #1. -Transfused 2 units PRBCs as hemoglobin currently at 6.6 this morning.  3.  Hypomagnesemia/hypokalemia -Magnesium at 1.4, potassium at 3.4 -Magnesium sulfate 4 g IV x 1, K-Dur 40 mEq p.o. x 1 -Repeat labs in AM.  4.  Hyponatremia -Likely secondary to hypovolemic hyponatremia versus SIADH secondary to lung cancer. -Check a urine sodium, urine creatinine.  -IV fluids.  5.  History of septic emboli August 2024 -Continue Eliquis.  6.  Non-small cell lung cancer with brain mets status postcraniotomy 6/30 and intracranial radiation -Patient with ongoing chemotherapy under the care of Dr. Shirline Frees last on 07/05/2023. -Continue current home pain regimen. -Oncology informed of admission via epic.  7.  BPH -Flomax  8. GERD PPI  DVT prophylaxis: Eliquis Code Status: Full Family Communication: Updated patient and wife at bedside. Disposition: TBD  Status is: Inpatient Remains inpatient appropriate because: Severity of illness   Consultants:  None  Procedures:  Chest x-ray 07/11/2023 Plain films of the right ankle 07/11/2023 Plain films of the right hand 07/11/2023  Antimicrobials:  Anti-infectives (From admission, onward)    Start     Dose/Rate Route Frequency Ordered Stop   07/12/23 0600  vancomycin (VANCOREADY) IVPB 750 mg/150 mL        750 mg 150 mL/hr over 60 Minutes Intravenous Every 12 hours 07/11/23 1635     07/12/23 0000  ceFEPIme (MAXIPIME) 2 g in sodium chloride 0.9 % 100 mL IVPB        2 g 200 mL/hr over 30 Minutes Intravenous Every 8 hours 07/11/23 1635     07/11/23 1615  vancomycin (VANCOREADY) IVPB 1250 mg/250 mL  1,250 mg 166.7 mL/hr over 90 Minutes Intravenous  Once 07/11/23 1547 07/11/23 1929   07/11/23 1600   ceFEPIme (MAXIPIME) 2 g in sodium chloride 0.9 % 100 mL IVPB        2 g 200 mL/hr over 30 Minutes Intravenous  Once 07/11/23 1543 07/11/23 1631         Subjective: Patient still with complaints of shortness of breath.  Still with complaints of weakness.  Overall feels a little bit better than on admission.  States gave a sputum sample earlier on.  Denies any overt bleeding.  Wife at bedside.  Objective: Vitals:   07/12/23 1151 07/12/23 1358 07/12/23 1431 07/12/23 1705  BP: 111/76 100/69 121/72 113/71  Pulse: 93 (!) 103 97 (!) 104  Resp:  18 18   Temp: 98.3 F (36.8 C) 98.1 F (36.7 C) 98.4 F (36.9 C) 97.8 F (36.6 C)  TempSrc: Oral Oral  Oral  SpO2: 98% 100% 100% 100%  Weight:      Height:        Intake/Output Summary (Last 24 hours) at 07/12/2023 1741 Last data filed at 07/12/2023 1650 Gross per 24 hour  Intake 2149.64 ml  Output 1530 ml  Net 619.64 ml   Filed Weights   07/11/23 1208  Weight: 62 kg    Examination:  General exam: Appears calm and comfortable  Respiratory system: Bibasilar coarse breath sounds.  No wheezing.  No crackles.  Fair air movement.  Speaking in full sentences.  Cardiovascular system: Regular rate rhythm no murmurs rubs or gallops.  No JVD.  No lower extremity edema.  Gastrointestinal system: Abdomen is nondistended, soft and nontender. No organomegaly or masses felt. Normal bowel sounds heard. Central nervous system: Alert and oriented. No focal neurological deficits. Extremities: Symmetric 5 x 5 power. Skin: No rashes, lesions or ulcers Psychiatry: Judgement and insight appear normal. Mood & affect appropriate.     Data Reviewed: I have personally reviewed following labs and imaging studies  CBC: Recent Labs  Lab 07/11/23 1315 07/12/23 0314 07/12/23 0856  WBC 1.0* 1.1*  --   NEUTROABS 0.5*  --   --   HGB 7.7* 7.0* 6.6*  HCT 23.6* 21.2* 20.2*  MCV 93.7 93.8  --   PLT 167 165  --     Basic Metabolic Panel: Recent Labs  Lab  07/11/23 1315 07/12/23 0314 07/12/23 0856  NA 132* 128*  --   K 3.7 3.4*  --   CL 96* 96*  --   CO2 27 24  --   GLUCOSE 96 97  --   BUN 13 12  --   CREATININE 0.69 0.86  --   CALCIUM 8.0* 7.7*  --   MG 1.2*  --  1.4*    GFR: Estimated Creatinine Clearance: 78.1 mL/min (by C-G formula based on SCr of 0.86 mg/dL).  Liver Function Tests: Recent Labs  Lab 07/11/23 1315  AST 29  ALT 36  ALKPHOS 157*  BILITOT 1.1  PROT 6.2*  ALBUMIN 2.2*    CBG: No results for input(s): "GLUCAP" in the last 168 hours.   Recent Results (from the past 240 hour(s))  Culture, blood (routine x 2)     Status: None (Preliminary result)   Collection Time: 07/11/23  2:00 PM   Specimen: BLOOD  Result Value Ref Range Status   Specimen Description BLOOD RIGHT ANTECUBITAL  Final   Special Requests   Final    BOTTLES DRAWN AEROBIC AND ANAEROBIC Blood Culture adequate  volume   Culture   Final    NO GROWTH < 24 HOURS Performed at [facility], [address] [place], [place] [address]    Report Status PENDING  Incomplete  Culture, blood (routine x 2)     Status: None (Preliminary result)   Collection Time: 07/11/23  2:37 PM   Specimen: BLOOD  Result Value Ref Range Status   Specimen Description   Final    BLOOD BLOOD LEFT ARM Performed at [facility], [address], [place], [place] [address]    Special Requests   Final    BOTTLES DRAWN AEROBIC AND ANAEROBIC Blood Culture adequate volume   Culture   Final    NO GROWTH < 24 HOURS Performed at [facility], [address] [place], [place] [address]    Report Status PENDING  Incomplete  Resp panel by RT-PCR (RSV, Flu A&B, Covid) Anterior Nasal Swab     Status: None   Collection Time: 07/12/23  8:35 AM   Specimen: Anterior Nasal Swab  Result Value Ref Range Status   SARS Coronavirus 2 by RT PCR NEGATIVE NEGATIVE Final    Comment: (NOTE) SARS-CoV-2 target nucleic acids are NOT DETECTED.  The SARS-CoV-2 RNA  is generally detectable in upper respiratory specimens during the acute phase of infection. The lowest concentration of SARS-CoV-2 viral copies this assay can detect is 138 copies/mL. A negative result does not preclude SARS-Cov-2 infection and should not be used as the sole basis for treatment or other patient management decisions. A negative result may occur with  improper specimen collection/handling, submission of specimen other than nasopharyngeal swab, presence of viral mutation(s) within the areas targeted by this assay, and inadequate number of viral copies(<138 copies/mL). A negative result must be combined with clinical observations, patient history, and epidemiological information. The expected result is Negative.  Fact Sheet for Patients:  BloggerCourse.com  Fact Sheet for Healthcare Providers:  SeriousBroker.it  This test is no t yet approved or cleared by the [place] FDA and  has been authorized for detection and/or diagnosis of SARS-CoV-2 by FDA under an Emergency Use Authorization (EUA). This EUA will remain  in effect (meaning this test can be used) for the duration of the COVID-19 declaration under Section 564(b)(1) of the Act, 21 U.S.C.section 360bbb-3(b)(1), unless the authorization is terminated  or revoked sooner.       Influenza A by PCR NEGATIVE NEGATIVE Final   Influenza B by PCR NEGATIVE NEGATIVE Final    Comment: (NOTE) The Xpert Xpress SARS-CoV-2/FLU/RSV plus assay is intended as an aid in the diagnosis of influenza from Nasopharyngeal swab specimens and should not be used as a sole basis for treatment. Nasal washings and aspirates are unacceptable for Xpert Xpress SARS-CoV-2/FLU/RSV testing.  Fact Sheet for Patients: BloggerCourse.com  Fact Sheet for Healthcare Providers: SeriousBroker.it  This test is not yet approved or cleared by the  [place] FDA and has been authorized for detection and/or diagnosis of SARS-CoV-2 by FDA under an Emergency Use Authorization (EUA). This EUA will remain in effect (meaning this test can be used) for the duration of the COVID-19 declaration under Section 564(b)(1) of the Act, 21 U.S.C. section 360bbb-3(b)(1), unless the authorization is terminated or revoked.     Resp Syncytial Virus by PCR NEGATIVE NEGATIVE Final    Comment: (NOTE) Fact Sheet for Patients: BloggerCourse.com  Fact Sheet for Healthcare Providers: SeriousBroker.it  This test is not yet approved or cleared by the [place] FDA and has been authorized for detection and/or diagnosis of SARS-CoV-2 by FDA under an Emergency Use Authorization (EUA). This EUA will remain in effect (meaning this test can be used) for the duration of the COVID-19 declaration under Section 564(b)(1) of the Act, 21 U.S.C. section 360bbb-3(b)(1), unless the authorization is terminated or revoked.  Performed at [facility], [address] [place], [place] [address]   Expectorated Sputum Assessment w Gram Stain, Rflx to Resp Cult     Status: None   Collection Time: 07/12/23 10:37 AM   Specimen: Expectorated Sputum  Result Value Ref Range Status   Specimen Description EXPECTORATED SPUTUM  Final   Special Requests NONE  Final   Sputum evaluation   Final    Sputum specimen not acceptable for testing.  Please recollect.   Alease Medina I RN @ 1141 ON 07/12/23 CAL Performed at [facility], [address] [place], [place] [address]    Report Status 07/12/2023 FINAL  Final         Radiology Studies: DG Chest Portable 1 View  Result Date: 07/11/2023 CLINICAL DATA:  Cough.  Recent pneumonia. EXAM: PORTABLE CHEST 1 VIEW COMPARISON:  AP chest 06/01/2023, 01/15/2011; CT chest 06/01/2023 FINDINGS: Right chest wall porta catheter with tip overlying the  mid superior vena cava, unchanged from 06/01/2023. Cardiac silhouette and mediastinal contours are within normal limits. Mild interval increase in density of heterogeneous airspace opacification throughout the inferior 40% of the right lung. Mildly increased density of lower density heterogeneous airspace opacity throughout the inferior 50% of the left lung. This likely represents worsening of a similar distribution multifocal pneumonia seen on prior 06/01/2023 radiograph and CT. No large pleural effusion is seen.  No pneumothorax. No acute skeletal abnormality. IMPRESSION: Mild interval increase in density of heterogeneous airspace opacification throughout the inferior 40% of the right lung and inferior 50% of the left lung. This likely represents worsening of a similar distribution multifocal pneumonia seen on prior 06/01/2023 radiograph and CT. Electronically Signed   By: Neita Garnet M.D.   On: 07/11/2023 15:38   DG Hand 2 View Right  Result Date: 07/11/2023 CLINICAL DATA:  Right hand swelling and pain. EXAM: RIGHT HAND - 2 VIEW COMPARISON:  None Available. FINDINGS: Pulse oximeter overlies the distal aspect of the index finger, obscuring the bones on frontal view. Normal alignment. Neutral ulnar variance. Mild dorsal carpometacarpal degenerative spurring, likely at the third digit. No acute fracture or dislocation. IMPRESSION: Mild dorsal carpometacarpal degenerative spurring, likely at the third digit. Electronically Signed   By: Neita Garnet M.D.   On: 07/11/2023 15:34   DG Ankle 2 Views Right  Result Date: 07/11/2023 CLINICAL DATA:  Right foot swelling and pain. EXAM: RIGHT ANKLE - 2 VIEW COMPARISON:  Right ankle radiographs 02/11/2021 FINDINGS: Interval lateral plate and screw fixation of the prior distal fibular diaphyseal fracture with improved, now anatomic alignment complete healing. Single screw fixation of the prior medial malleolar fracture, with normal alignment and complete healing. 3 mm  chronic ossicle medial to the medial malleolus. The ankle mortise is symmetric and intact. No acute fracture is seen. No dislocation. IMPRESSION: Compared to 02/11/2021: 1. Interval ORIF of the prior distal fibular diaphyseal and medial malleolar fractures with improved, now anatomic alignment and complete healing. 2. No acute fracture. Electronically Signed   By: Neita Garnet M.D.   On: 07/11/2023 15:32        Scheduled Meds:  apixaban  5 mg Oral BID  arformoterol  15 mcg Nebulization BID   budesonide (PULMICORT) nebulizer solution  0.5 mg Nebulization BID   Chlorhexidine Gluconate Cloth  6 each Topical Daily   ferrous sulfate  325 mg Oral Q breakfast   fluticasone  2 spray Each Nare Daily   guaiFENesin  1,200 mg Oral BID   ipratropium  0.5 mg Nebulization BID   levalbuterol  0.63 mg Nebulization BID   loratadine  10 mg Oral Daily   pantoprazole  40 mg Oral BID AC   sodium chloride flush  10-40 mL Intracatheter Q12H   Tbo-filgastrim (GRANIX) SQ  480 mcg Subcutaneous Daily   Continuous Infusions:  sodium chloride 125 mL/hr at 07/12/23 0845   ceFEPime (MAXIPIME) IV 2 g (07/12/23 1531)   vancomycin 750 mg (07/12/23 1713)     LOS: 1 day    Time spent: 40 minutes    Ramiro Harvest, MD [facility]   To contact the attending provider between 7A-7P or the covering provider during after hours 7P-7A, please log into the web site [email or web address] and access using universal [place] password for that web site. If you do not have the password, please call the hospital operator.  07/12/2023, 5:41 PM

## 2023-07-12 NOTE — Progress Notes (Signed)
PT Cancellation Note  Patient Details Name: Matthew Rivera MRN: [id number] DOB: [birth year]   Cancelled Treatment:    Reason Eval/Treat Not Completed: Medical issues which prohibited therapy--low hgb-transfusion. Will hold PT for now and check back as schedule allows.    Faye Ramsay, PT Acute Rehabilitation  Office: [phone]

## 2023-07-13 DIAGNOSIS — D701 Agranulocytosis secondary to cancer chemotherapy: Secondary | ICD-10-CM | POA: Diagnosis not present

## 2023-07-13 DIAGNOSIS — A419 Sepsis, unspecified organism: Secondary | ICD-10-CM | POA: Diagnosis not present

## 2023-07-13 DIAGNOSIS — J189 Pneumonia, unspecified organism: Secondary | ICD-10-CM | POA: Diagnosis not present

## 2023-07-13 DIAGNOSIS — I2699 Other pulmonary embolism without acute cor pulmonale: Secondary | ICD-10-CM | POA: Diagnosis not present

## 2023-07-13 LAB — BLOOD CULTURE ID PANEL (REFLEXED) - BCID2

## 2023-07-13 LAB — CBC WITH DIFFERENTIAL/PLATELET
Abs Immature Granulocytes: 0.38 10*3/uL — ABNORMAL HIGH (ref 0.00–0.07)
Basophils Absolute: 0 10*3/uL (ref 0.0–0.1)
Basophils Relative: 1 %
Eosinophils Absolute: 0.1 10*3/uL (ref 0.0–0.5)
Eosinophils Relative: 5 %
HCT: 26 % — ABNORMAL LOW (ref 39.0–52.0)
Hemoglobin: 8.8 g/dL — ABNORMAL LOW (ref 13.0–17.0)
Immature Granulocytes: 21 %
Lymphocytes Relative: 14 %
Lymphs Abs: 0.3 10*3/uL — ABNORMAL LOW (ref 0.7–4.0)
MCH: 29.7 pg (ref 26.0–34.0)
MCHC: 33.8 g/dL (ref 30.0–36.0)
MCV: 87.8 fL (ref 80.0–100.0)
Monocytes Absolute: 0.2 10*3/uL (ref 0.1–1.0)
Monocytes Relative: 13 %
Neutro Abs: 0.9 10*3/uL — ABNORMAL LOW (ref 1.7–7.7)
Neutrophils Relative %: 46 %
Platelets: 129 10*3/uL — ABNORMAL LOW (ref 150–400)
RBC: 2.96 MIL/uL — ABNORMAL LOW (ref 4.22–5.81)
RDW: 17.2 % — ABNORMAL HIGH (ref 11.5–15.5)
WBC: 1.8 10*3/uL — ABNORMAL LOW (ref 4.0–10.5)
nRBC: 0 % (ref 0.0–0.2)

## 2023-07-13 LAB — TYPE AND SCREEN
ABO/RH(D): O POS
Antibody Screen: NEGATIVE
Unit division: 0
Unit division: 0

## 2023-07-13 LAB — BPAM RBC
Blood Product Expiration Date: 202410262359
Blood Product Expiration Date: 202410262359
ISSUE DATE / TIME: 202410011407
ISSUE DATE / TIME: 202410011712
Unit Type and Rh: 5100
Unit Type and Rh: 5100

## 2023-07-13 LAB — BASIC METABOLIC PANEL
Anion gap: 8 (ref 5–15)
BUN: 10 mg/dL (ref 8–23)
CO2: 23 mmol/L (ref 22–32)
Calcium: 7.5 mg/dL — ABNORMAL LOW (ref 8.9–10.3)
Chloride: 98 mmol/L (ref 98–111)
Creatinine, Ser: 0.7 mg/dL (ref 0.61–1.24)
GFR, Estimated: >60 mL/min (ref >60)
Glucose, Bld: 89 mg/dL (ref 70–99)
Potassium: 3.5 mmol/L (ref 3.5–5.1)
Sodium: 129 mmol/L — ABNORMAL LOW (ref 135–145)

## 2023-07-13 LAB — CULTURE, BLOOD (ROUTINE X 2): Special Requests: ADEQUATE

## 2023-07-13 LAB — MAGNESIUM: Magnesium: 1.7 mg/dL (ref 1.7–2.4)

## 2023-07-13 LAB — URINE CULTURE: Culture: NO GROWTH

## 2023-07-13 LAB — MRSA NEXT GEN BY PCR, NASAL: MRSA by PCR Next Gen: NOT DETECTED

## 2023-07-13 MED ORDER — SODIUM CHLORIDE 0.9 % IV SOLN: INTRAVENOUS | Status: AC

## 2023-07-13 NOTE — Progress Notes (Signed)
PHARMACY - PHYSICIAN COMMUNICATION CRITICAL VALUE ALERT - BLOOD CULTURE IDENTIFICATION (BCID)  Matthew Rivera is an 62 y.o. male who presented to [facility] on 07/11/2023 with a chief complaint of pain and swelling in hands and right foot.   Assessment:   Admit with sepsis due to worsening PNA complicated by neutropenia s/p chemotherapy.  Aerobic bottle of 1 blood cx set now + GPCC, however no organism detected on BCID.   Name of physician (or Provider) ContactedJohann Capers, NP  Current antibiotics: Cefepime + Vancomycin  Changes to prescribed antibiotics recommended:  Continue current antibiotics- no de-escalation/changes based on current culture information.   Results for orders placed or performed during the hospital encounter of 07/11/23  Blood Culture ID Panel (Reflexed) (Collected: 07/11/2023  2:37 PM)  Result Value Ref Range   Enterococcus faecalis NOT DETECTED NOT DETECTED   Enterococcus Faecium NOT DETECTED NOT DETECTED   Listeria monocytogenes NOT DETECTED NOT DETECTED   Staphylococcus species NOT DETECTED NOT DETECTED   Staphylococcus aureus (BCID) NOT DETECTED NOT DETECTED   Staphylococcus epidermidis NOT DETECTED NOT DETECTED   Staphylococcus lugdunensis NOT DETECTED NOT DETECTED   Streptococcus species NOT DETECTED NOT DETECTED   Streptococcus agalactiae NOT DETECTED NOT DETECTED   Streptococcus pneumoniae NOT DETECTED NOT DETECTED   Streptococcus pyogenes NOT DETECTED NOT DETECTED   A.calcoaceticus-baumannii NOT DETECTED NOT DETECTED   Bacteroides fragilis NOT DETECTED NOT DETECTED   Enterobacterales NOT DETECTED NOT DETECTED   Enterobacter cloacae complex NOT DETECTED NOT DETECTED   Escherichia coli NOT DETECTED NOT DETECTED   Klebsiella aerogenes NOT DETECTED NOT DETECTED   Klebsiella oxytoca NOT DETECTED NOT DETECTED   Klebsiella pneumoniae NOT DETECTED NOT DETECTED   Proteus species NOT DETECTED NOT DETECTED   Salmonella species NOT DETECTED NOT DETECTED    Serratia marcescens NOT DETECTED NOT DETECTED   Haemophilus influenzae NOT DETECTED NOT DETECTED   Neisseria meningitidis NOT DETECTED NOT DETECTED   Pseudomonas aeruginosa NOT DETECTED NOT DETECTED   Stenotrophomonas maltophilia NOT DETECTED NOT DETECTED   Candida albicans NOT DETECTED NOT DETECTED   Candida auris NOT DETECTED NOT DETECTED   Candida glabrata NOT DETECTED NOT DETECTED   Candida krusei NOT DETECTED NOT DETECTED   Candida parapsilosis NOT DETECTED NOT DETECTED   Candida tropicalis NOT DETECTED NOT DETECTED   Cryptococcus neoformans/gattii NOT DETECTED NOT DETECTED    Junita Push PharmD 07/13/2023  12:55 AM

## 2023-07-13 NOTE — Evaluation (Signed)
Physical Therapy Evaluation-1x Patient Details Name: Matthew Rivera MRN: [id number] DOB: [birth year] Today's Date: 07/13/2023  History of Present Illness  62 yo male admitted with Sepsis, Pna, acute on chronic anemia. Hx of PE, NSCLC with mets s/p craniotomy 03/2023, anemia, SIADH, neutropenia, back sg, OA  Clinical Impression  On eval, pt was Supv-Mod Ind with mobility. He walked ~300 feet around the unit without a device. Tolerated activity well. Pre-activity: HR 112 bpm, O2 97% 3L; During activity: HR 125 bpm, O2 90% on 3L, Post activity: HR 115 bpm, O2 96% on 3L. No acute PT needs. 1x eval. Will sign off. Recommend daily ambulation in hallway with nursing and/or mobility team assistance.        If plan is discharge home, recommend the following:     Can travel by private vehicle        Equipment Recommendations None recommended by PT  Recommendations for Other Services       Functional Status Assessment Patient has had a recent decline in their functional status and demonstrates the ability to make significant improvements in function in a reasonable and predictable amount of time.     Precautions / Restrictions        Mobility  Bed Mobility               General bed mobility comments: oob in recliner    Transfers Overall transfer level: Modified independent                      Ambulation/Gait Ambulation/Gait assistance: Supervision Gait Distance (Feet): 300 Feet Assistive device: None Gait Pattern/deviations: Step-through pattern       General Gait Details: Tolerated distance well. O2 90% on 3L, dyspnea 2/4, HR 125 bpm.  Stairs            Wheelchair Mobility     Tilt Bed    Modified Rankin (Stroke Patients Only)       Balance Overall balance assessment: Mild deficits observed, not formally tested                                           Pertinent Vitals/Pain Pain Assessment Pain Assessment: No/denies pain     Home Living Family/patient expects to be discharged to:: Private residence Living Arrangements: Spouse/significant other Available Help at Discharge: Family;Available 24 hours/day Type of Home: House Home Access: Stairs to enter Entrance Stairs-Rails: Right Entrance Stairs-Number of Steps: 4   Home Layout: One level Home Equipment: Agricultural consultant (2 wheels);Cane - single point      Prior Function Prior Level of Function : Needs assist             Mobility Comments: ind ADLs Comments: requires assistance     Extremity/Trunk Assessment   Upper Extremity Assessment Upper Extremity Assessment: Generalized weakness    Lower Extremity Assessment Lower Extremity Assessment: Generalized weakness    Cervical / Trunk Assessment Cervical / Trunk Assessment: Normal  Communication   Communication Communication: No apparent difficulties  Cognition Arousal: Alert Behavior During Therapy: WFL for tasks assessed/performed Overall Cognitive Status: Within Functional Limits for tasks assessed                                          General Comments  Exercises     Assessment/Plan    PT Assessment Patient does not need any further PT services  PT Problem List         PT Treatment Interventions      PT Goals (Current goals can be found in the Care Plan section)  Acute Rehab PT Goals Patient Stated Goal: home PT Goal Formulation: All assessment and education complete, DC therapy    Frequency       Co-evaluation               AM-PAC PT "6 Clicks" Mobility  Outcome Measure Help needed turning from your back to your side while in a flat bed without using bedrails?: None Help needed moving from lying on your back to sitting on the side of a flat bed without using bedrails?: None Help needed moving to and from a bed to a chair (including a wheelchair)?: None Help needed standing up from a chair using your arms (e.g., wheelchair or  bedside chair)?: None Help needed to walk in hospital room?: A Little Help needed climbing 3-5 steps with a railing? : A Little 6 Click Score: 22    End of Session Equipment Utilized During Treatment: Gait belt;Oxygen Activity Tolerance: Patient tolerated treatment well Patient left: in chair;with call bell/phone within reach;with family/visitor present        Time: 6644-0347 PT Time Calculation (min) (ACUTE ONLY): 21 min   Charges:   PT Evaluation $PT Eval Low Complexity: 1 Low   PT General Charges $$ ACUTE PT VISIT: 1 Visit           Faye Ramsay, PT Acute Rehabilitation  Office: [phone]

## 2023-07-13 NOTE — Hospital Course (Addendum)
The patient is a unfortunate 62 year old pleasant Caucasian male with a past medical history significant for bilateral deafness, GERD, non-small cell lung cancer with brain metastasis status post craniotomy as well as recent hospitalization for acute respiratory failure with hypoxia secondary community-acquired pneumonia and PE who presented to the ED with generalized malaise, and general pain with concern for sepsis from HCAP.  He has noted to have chemotherapy on 07/05/2023 however he failed to improve in terms of energy and appetite acutely does postchemotherapy.  He presented to the ED and chest x-ray done and showed a worsening bilateral consolidation infiltrate and he was placed on antibiotics empirically.  He subsequently also was placed on Granix and he improved after several days of treatment.  He is now stable and will need to follow-up with his PCP and medical oncologist within the coming weeks and repeat CXR within 3-6 weeks.  Assessment and Plan:  Sepsis secondary to HCAP, POA, improved  -Patient presenting with concern for sepsis meeting criteria with tachycardia, tachypnea, chest x-ray with concern for bilateral consolidation/infiltrate.  Lactic acid level noted to be normal. -Patient noted to have presented with generalized weakness and decreased oral intake. -Urine strep pneumococcus antigen negative. -Urine Legionella antigen pending. -Check a sputum Gram stain and culture. -SARS coronavirus 2 PCR negative, influenza A and B PCR negative, RSV PCR negative. -Blood cultures pending. -Continue empiric IV vancomycin and IV cefepime and change to Oral Abx at D/C  -Continue Brovana.-Discontinued Incruse and place on Pulmicort. -Place on Mucinex, scheduled Xopenex and Atrovent nebs, Claritin, PPI. -Repeat CXR in the AM SpO2: 100 % O2 Flow Rate (L/min): 3 L/min -Supportive care and patient improved -Repeat chest x-ray in 3 to 6 weeks follow-up with PCP within 1 week   Chemotherapy  Pancytopenia -Patient denies any overt bleeding. -CBC Trend: Recent Labs  Lab 06/21/23 1202 06/27/23 0844 07/05/23 0820 07/05/23 0820 07/11/23 1315 07/12/23 0314 07/12/23 0856 07/13/23 0452 07/14/23 0304  WBC 4.2 3.3* 4.6  --  1.0* 1.1*  --  1.8* 3.9*  HGB 7.6* 8.0* 9.8*   < > 7.7* 7.0*   < > 8.8* 9.1*  HCT 22.5* 24.3* 29.0*  --  23.6* 21.2*   < > 26.0* 27.3*  MCV 91.5 95.3 94.8  --  93.7 93.8  --  87.8 89.2  PLT 369 199 183   < > 167 165  --  129* 124*   < > = values in this interval not displayed.  -Anemia Panel was done and showed an iron level of 43, UIBC of 82, TIBC 125, saturation of 35%, ferritin level 2410, folate level 24.1, vitamin B12 763 -Likely secondary to chemotherapy. -Continue Granix. -Patient pancultured. -C/w Ferrous Sulfate 325 mg po qHS -Continue empiric IV antibiotics secondary to problem #1. -Transfused 2 units PRBCs as hemoglobin currently improved and stable -Follow up with Oncology as an outpatient and stop Granix given ANC of 3100.   Hypokalemia -Patient's K+ Level Trend: Recent Labs  Lab 06/21/23 1202 06/27/23 0844 07/05/23 0820 07/11/23 1315 07/12/23 0314 07/13/23 0452 07/14/23 1020  K 4.3 4.0 3.8 3.7 3.4* 3.5 3.2*  -Replete with po KCL 40 mEQ BID x2 and po K Phos Neutral 500 mg BID x2 -Continue to Monitor and Replete as Necessary -Repeat CMP within 1 week  Hypomagnesemia -Patient's Mag Level Trend: Recent Labs  Lab 07/11/23 1315 07/12/23 0856 07/13/23 0452 07/14/23 1020  MG 1.2* 1.4* 1.7 1.4*  -Replete with IV Mag Sulfate 4 grams prior tod D/C -Continue to  Monitor and Replete as Necessary -Repeat Mag within 1 week  Hyponatremia -Likely secondary to hypovolemic hyponatremia versus SIADH secondary to lung cancer. -Na+ Trend: Recent Labs  Lab 06/21/23 1202 06/27/23 0844 07/05/23 0820 07/11/23 1315 07/12/23 0314 07/13/23 0452 07/14/23 1020  NA 134* 134* 136 132* 128* 129* 132*  -Check a urine sodium, urine creatinine.   -Was Getting IVF with NS at 125 mL/hr and will reduce to 75 mL/hr -Follow up CMP within 1 week   History of Septic Emboli August 2024 -Continue Apixaban 5 mg po BID.   Non-small cell lung cancer with brain mets status postcraniotomy 6/30 and intracranial radiation -Patient with ongoing chemotherapy under the care of Dr. Shirline Frees last on 07/05/2023.  Patient is on carboplatin, Alimta need to and Keytruda -Continue current home pain regimen with Oxycodone 10 mg po q4hprn for Pain. -Oncology informed of admission via epic.  And recommending to hold chemotherapy regimen until further discussion outpatient setting and patient to follow up within the coming weeks   BPH -C/w Tamsulosin 0.4 mg po qHSprn Urinary Flow Issues  Micrococcus Bacteremia  -Discussed with ID and feel it is a contaminant  -Repeat Blood Cx x2 showed NGTD at 3 Days. -C/w Oral Abx at D/C   GERD/GI Prophylaxis -C/w PPI with Pantoprazole 40 mg po BID  Poor po Intake and Moderate Malnutrition -Consult Nutritionist for further evaluation  Nutrition Status: Nutrition Problem: Moderate Malnutrition Etiology: chronic illness (lung cancer with brain mets) Signs/Symptoms: moderate fat depletion, moderate muscle depletion, percent weight loss Interventions: Ensure Enlive (each supplement provides 350kcal and 20 grams of protein), MVI

## 2023-07-13 NOTE — Evaluation (Signed)
Occupational Therapy Evaluation Patient Details Name: Matthew Rivera MRN: [id number] DOB: [birth year] Today's Date: 07/13/2023   History of Present Illness 62 yr old male admitted with malaise, cough, and generalized pain. Found to have sepsis, PNA and acute on chronic anemia. Hx of PE, NSCLC with mets s/p craniotomy 03/2023, anemia, SIADH, neutropenia, back surgery, OA   Clinical Impression   The pt appears to be at or very near to his baseline level of functioning for self-care management, reflective of modified independent to independent status. He did report 3L O2 use at his baseline with associated endurance deficits. As such, OT educated him on energy conservation strategies to implement as needed during ADLs and IADLs; a relevant educational handout was reviewed and issued. No further OT needs are identified. OT will sign off.       If plan is discharge home, recommend the following: Assistance with cooking/housework;Assist for transportation    Functional Status Assessment  Patient has not had a recent decline in their functional status  Equipment Recommendations  None recommended by OT    Recommendations for Other Services       Precautions / Restrictions Restrictions Weight Bearing Restrictions: No Other Position/Activity Restrictions: O2 dependent at baseline (3L)      Mobility Bed Mobility Overal bed mobility: Independent         Transfers Overall transfer level: Independent Equipment used: None Transfers: Sit to/from Stand Sit to Stand: Independent                  Balance Overall balance assessment: Mild deficits observed, not formally tested          ADL either performed or assessed with clinical judgement   ADL Overall ADL's : Modified independent;At baseline;Independent              Pertinent Vitals/Pain Pain Assessment Pain Assessment: 0-10 Pain Score: 5  Pain Location: low back Pain Intervention(s): Limited activity within patient's  tolerance, Monitored during session     Extremity/Trunk Assessment Upper Extremity Assessment Upper Extremity Assessment: Overall WFL for tasks assessed   Lower Extremity Assessment Lower Extremity Assessment: Overall WFL for tasks assessed       Communication Communication Communication: No apparent difficulties   Cognition Arousal: Alert Behavior During Therapy: WFL for tasks assessed/performed Overall Cognitive Status: Within Functional Limits for tasks assessed            General Comments: Oriented x4, able to follow commands without difficulty, friendly                Home Living Family/patient expects to be discharged to:: Private residence Living Arrangements: Spouse/significant other Available Help at Discharge: Family Type of Home: House Home Access: Stairs to enter Secretary/administrator of Steps: 4 Entrance Stairs-Rails: Left;Right Home Layout: One level     Bathroom Shower/Tub: Tub/shower unit         Home Equipment: Agricultural consultant (2 wheels);Cane - single point;Tub bench;Shower Systems developer (4 wheels)          Prior Functioning/Environment Prior Level of Function : Independent/Modified Independent;Driving             Mobility Comments:  (Independent for household ambulation.) ADLs Comments:  (He reported being modified independent to independent with ADLs and cooking.  His spouse performed most of the cleaning. He drives, however not frequently.)             OT Treatment/Interventions:   N/A      OT Frequency:  N/A  AM-PAC OT "6 Clicks" Daily Activity     Outcome Measure Help from another person eating meals?: None Help from another person taking care of personal grooming?: None Help from another person toileting, which includes using toliet, bedpan, or urinal?: None Help from another person bathing (including washing, rinsing, drying)?: None Help from another person to put on and taking off  regular upper body clothing?: None Help from another person to put on and taking off regular lower body clothing?: None 6 Click Score: 24   End of Session Equipment Utilized During Treatment: Oxygen Nurse Communication: Mobility status  Activity Tolerance: Patient tolerated treatment well Patient left: in bed;with call bell/phone within reach;with family/visitor present  OT Visit Diagnosis: Muscle weakness (generalized) (M62.81)                Time: 9562-1308 OT Time Calculation (min): 13 min Charges:  OT General Charges $OT Visit: 1 Visit OT Evaluation $OT Eval Low Complexity: 1 Low    Cami Delawder L Rosellen Lichtenberger, OTR/L 07/13/2023, 4:31 PM

## 2023-07-13 NOTE — Plan of Care (Signed)
  Problem: Clinical Measurements: Goal: Respiratory complications will improve Outcome: Progressing   Problem: Activity: Goal: Risk for activity intolerance will decrease Outcome: Progressing   Problem: Nutrition: Goal: Adequate nutrition will be maintained Outcome: Progressing   Problem: Pain Managment: Goal: General experience of comfort will improve Outcome: Progressing   Problem: Clinical Measurements: Goal: Signs and symptoms of infection will decrease Outcome: Progressing

## 2023-07-13 NOTE — Progress Notes (Signed)
PROGRESS NOTE    Matthew Rivera  HKV:[id number] DOB: Apr 20, 1961 DOA: 07/11/2023 PCP: [name], MD   Brief Narrative:  The patient is a unfortunate 62 year old pleasant Caucasian male with a past medical history significant for bilateral deafness, GERD, non-small cell lung cancer with brain metastasis status post craniotomy as well as recent hospitalization for acute respiratory failure with hypoxia secondary community-acquired pneumonia and PE who presented to the ED with generalized malaise, and general pain with concern for sepsis from HCAP.  He has noted to have chemotherapy on 07/05/2023 however he failed to improve in terms of energy and appetite acutely does postchemotherapy.  He presented to the ED and chest x-ray done and showed a worsening bilateral consolidation infiltrate and he was placed on antibiotics empirically.  Assessment and Plan:  Sepsis secondary to HCAP, POA -Patient presenting with concern for sepsis meeting criteria with tachycardia, tachypnea, chest x-ray with concern for bilateral consolidation/infiltrate.  Lactic acid level noted to be normal. -Patient noted to have presented with generalized weakness and decreased oral intake. -Urine strep pneumococcus antigen negative. -Urine Legionella antigen pending. -Check a sputum Gram stain and culture. -SARS coronavirus 2 PCR negative, influenza A and B PCR negative, RSV PCR negative. -Blood cultures pending. -Continue empiric IV vancomycin and IV cefepime. -Continue Brovana.-Discontinued Incruse and place on Pulmicort. -Place on Mucinex, scheduled Xopenex and Atrovent nebs, Claritin, PPI. -Repeat CXR in the AM SpO2: 95 % O2 Flow Rate (L/min): 3 L/min -Supportive care.   Chemotherapy Pancytopenia -Patient denies any overt bleeding. -CBC Trend: Recent Labs  Lab 06/15/23 1111 06/21/23 1202 06/27/23 0844 07/05/23 0820 07/05/23 0820 07/11/23 1315 07/12/23 0314 07/12/23 0856 07/13/23 0452  WBC 9.7 4.2 3.3*  4.6  --  1.0* 1.1*  --  1.8*  HGB 8.5* 7.6* 8.0* 9.8*   < > 7.7* 7.0*   < > 8.8*  HCT 26.3* 22.5* 24.3* 29.0*  --  23.6* 21.2*   < > 26.0*  MCV 93.3 91.5 95.3 94.8  --  93.7 93.8  --  87.8  PLT 369 369 199 183   < > 167 165  --  129*   < > = values in this interval not displayed.  -Anemia Panel was done and showed an iron level of 43, UIBC of 82, TIBC 125, saturation of 35%, ferritin level 2410, folate level 24.1, vitamin B12 763 -Likely secondary to chemotherapy. -Continue Granix. -Patient pancultured. -C/w Ferrous Sulfate 325 mg po qHS -Continue empiric IV antibiotics secondary to problem #1. -Transfused 2 units PRBCs as hemoglobin currently at 6.6 this morning.   Hypokalemia -Patient's K+ Level Trend: Recent Labs  Lab 06/15/23 1111 06/21/23 1202 06/27/23 0844 07/05/23 0820 07/11/23 1315 07/12/23 0314 07/13/23 0452  K 4.1 4.3 4.0 3.8 3.7 3.4* 3.5  -Continue to Monitor and Replete as Necessary -Repeat CMP in the AM   Hypomagnesemia -Patient's Mag Level Trend: Recent Labs  Lab 07/11/23 1315 07/12/23 0856 07/13/23 0452  MG 1.2* 1.4* 1.7  -Replete with IV Mag Sulfate 2 grams -Continue to Monitor and Replete as Necessary -Repeat Mag in the AM   Hyponatremia -Likely secondary to hypovolemic hyponatremia versus SIADH secondary to lung cancer. -Na+ Trend: Recent Labs  Lab 06/15/23 1111 06/21/23 1202 06/27/23 0844 07/05/23 0820 07/11/23 1315 07/12/23 0314 07/13/23 0452  NA 137 134* 134* 136 132* 128* 129*  -Check a urine sodium, urine creatinine.  -Getting IVF with NS at 125 mL/hr and will reduce to 75 mL/hr   History of Septic emboli August  2024 -Continue Apixaban 5 mg po BID.   Non-small cell lung cancer with brain mets status postcraniotomy 6/30 and intracranial radiation -Patient with ongoing chemotherapy under the care of Dr. Shirline Frees last on 07/05/2023.  Patient is on carboplatin, Alimta need to and Keytruda -Continue current home pain regimen with  Oxycodone 10 mg po q4hprn for Pain. -Oncology informed of admission via epic.  And recommending to hold chemotherapy regimen until further discussion outpatient setting   BPH -C/w Tamsulosin 0.4 mg po qHSprn Urinary Flow Issues   GERD/GI Prophylaxis -C/w PPI with Pantoprazole 40 mg po BID  Poor po Intake -Consult Nutritionist for further evaluation    DVT prophylaxis: SCDs Start: 07/11/23 1600 apixaban (ELIQUIS) tablet 5 mg    Code Status: Full Code Family Communication: Discussed with Family present at bedside  Disposition Plan:  Level of care: Progressive Status is: Inpatient Remains inpatient appropriate because: Needs further clinical improvement   Consultants:  Medical Oncology  Procedures:  As delineated as above  Antimicrobials:  Anti-infectives (From admission, onward)    Start     Dose/Rate Route Frequency Ordered Stop   07/12/23 0600  vancomycin (VANCOREADY) IVPB 750 mg/150 mL        750 mg 150 mL/hr over 60 Minutes Intravenous Every 12 hours 07/11/23 1635     07/12/23 0000  ceFEPIme (MAXIPIME) 2 g in sodium chloride 0.9 % 100 mL IVPB        2 g 200 mL/hr over 30 Minutes Intravenous Every 8 hours 07/11/23 1635     07/11/23 1615  vancomycin (VANCOREADY) IVPB 1250 mg/250 mL        1,250 mg 166.7 mL/hr over 90 Minutes Intravenous  Once 07/11/23 1547 07/11/23 1929   07/11/23 1600  ceFEPIme (MAXIPIME) 2 g in sodium chloride 0.9 % 100 mL IVPB        2 g 200 mL/hr over 30 Minutes Intravenous  Once 07/11/23 1543 07/11/23 1631       Subjective: Seen and examined at bedside and he had a very poor p.o. intake.  Worried that whenever he eats he will vomit.  Denies any lightheadedness or dizziness.  No other concerns or complaints at this time.  Objective: Vitals:   07/12/23 2043 07/13/23 0559 07/13/23 0750 07/13/23 0800  BP:  94/69    Pulse:  (!) 103    Resp:  (!) 21  20  Temp:  97.8 F (36.6 C)    TempSrc:  Oral    SpO2: 95% 94% 95%   Weight:      Height:         Intake/Output Summary (Last 24 hours) at 07/13/2023 1605 Last data filed at 07/13/2023 1559 Gross per 24 hour  Intake 3811.37 ml  Output 2005 ml  Net 1806.37 ml   Filed Weights   07/11/23 1208  Weight: 62 kg   Examination: Physical Exam:  Constitutional: WN/WD chronically ill-appearing Caucasian male in no acute distress Respiratory: Diminished to auscultation bilaterally, no wheezing, rales, rhonchi or crackles. Normal respiratory effort and patient is not tachypenic. No accessory muscle use.  Unlabored breathing Cardiovascular: RRR, no murmurs / rubs / gallops. S1 and S2 auscultated. No extremity edema. Abdomen: Soft, non-tender, non-distended. Bowel sounds positive.  GU: Deferred. Musculoskeletal: No clubbing / cyanosis of digits/nails. No joint deformity upper and lower extremities.  Skin: No rashes, lesions, ulcers limited skin evaluation. No induration; Warm and dry.  Neurologic: CN 2-12 grossly intact with no focal deficits. Romberg sign and cerebellar reflexes not  assessed.  Psychiatric: Normal judgment and insight. Alert and oriented x 3. Normal mood and appropriate affect.   Data Reviewed: I have personally reviewed following labs and imaging studies  CBC: Recent Labs  Lab 07/11/23 1315 07/12/23 0314 07/12/23 0856 07/12/23 2255 07/13/23 0452  WBC 1.0* 1.1*  --   --  1.8*  NEUTROABS 0.5*  --   --   --  0.9*  HGB 7.7* 7.0* 6.6* 8.7* 8.8*  HCT 23.6* 21.2* 20.2* 26.0* 26.0*  MCV 93.7 93.8  --   --  87.8  PLT 167 165  --   --  129*   Basic Metabolic Panel: Recent Labs  Lab 07/11/23 1315 07/12/23 0314 07/12/23 0856 07/13/23 0452  NA 132* 128*  --  129*  K 3.7 3.4*  --  3.5  CL 96* 96*  --  98  CO2 27 24  --  23  GLUCOSE 96 97  --  89  BUN 13 12  --  10  CREATININE 0.69 0.86  --  0.70  CALCIUM 8.0* 7.7*  --  7.5*  MG 1.2*  --  1.4* 1.7   GFR: Estimated Creatinine Clearance: 84 mL/min (by C-G formula based on SCr of 0.7 mg/dL). Liver Function  Tests: Recent Labs  Lab 07/11/23 1315  AST 29  ALT 36  ALKPHOS 157*  BILITOT 1.1  PROT 6.2*  ALBUMIN 2.2*   No results for input(s): "LIPASE", "AMYLASE" in the last 168 hours. No results for input(s): "AMMONIA" in the last 168 hours. Coagulation Profile: No results for input(s): "INR", "PROTIME" in the last 168 hours. Cardiac Enzymes: No results for input(s): "CKTOTAL", "CKMB", "CKMBINDEX", "TROPONINI" in the last 168 hours. BNP (last 3 results) No results for input(s): "PROBNP" in the last 8760 hours. HbA1C: No results for input(s): "HGBA1C" in the last 72 hours. CBG: No results for input(s): "GLUCAP" in the last 168 hours. Lipid Profile: No results for input(s): "CHOL", "HDL", "LDLCALC", "TRIG", "CHOLHDL", "LDLDIRECT" in the last 72 hours. Thyroid Function Tests: No results for input(s): "TSH", "T4TOTAL", "FREET4", "T3FREE", "THYROIDAB" in the last 72 hours. Anemia Panel: Recent Labs    07/12/23 0856  VITAMINB12 763  FOLATE 24.1  FERRITIN 2,410*  TIBC 125*  IRON 43*   Sepsis Labs: Recent Labs  Lab 07/11/23 1409  LATICACIDVEN 0.7    Recent Results (from the past 240 hour(s))  Culture, blood (routine x 2)     Status: None (Preliminary result)   Collection Time: 07/11/23  2:00 PM   Specimen: BLOOD  Result Value Ref Range Status   Specimen Description BLOOD RIGHT ANTECUBITAL  Final   Special Requests   Final    BOTTLES DRAWN AEROBIC AND ANAEROBIC Blood Culture adequate volume   Culture   Final    NO GROWTH 2 DAYS Performed at [facility], [address] [place], [place] [address]    Report Status PENDING  Incomplete  Culture, blood (routine x 2)     Status: Abnormal   Collection Time: 07/11/23  2:37 PM   Specimen: BLOOD  Result Value Ref Range Status   Specimen Description   Final    BLOOD BLOOD LEFT ARM Performed at [facility], [address], [place], [place] [address]    Special Requests   Final    BOTTLES DRAWN AEROBIC  AND ANAEROBIC Blood Culture adequate volume   Culture  Setup Time   Final    GRAM POSITIVE COCCI IN CLUSTERS BOTTLES DRAWN AEROBIC  ONLY CRITICAL RESULT CALLED TO, READ BACK BY AND VERIFIED WITH: M LILLISTON,[email or web address]@[email or web address]  07/13/23 MK    Culture (A)  Final    MICROCOCCUS SPECIES MICROCOCCUS LUTEUS Standardized susceptibility testing for this organism is not available. Performed at [facility], [address], [place], [place] [address]    Report Status 07/13/2023 FINAL  Final  Blood Culture ID Panel (Reflexed)     Status: None   Collection Time: 07/11/23  2:37 PM  Result Value Ref Range Status   Enterococcus faecalis NOT DETECTED NOT DETECTED Final   Enterococcus Faecium NOT DETECTED NOT DETECTED Final   Listeria monocytogenes NOT DETECTED NOT DETECTED Final   Staphylococcus species NOT DETECTED NOT DETECTED Final   Staphylococcus aureus (BCID) NOT DETECTED NOT DETECTED Final   Staphylococcus epidermidis NOT DETECTED NOT DETECTED Final   Staphylococcus lugdunensis NOT DETECTED NOT DETECTED Final   Streptococcus species NOT DETECTED NOT DETECTED Final   Streptococcus agalactiae NOT DETECTED NOT DETECTED Final   Streptococcus pneumoniae NOT DETECTED NOT DETECTED Final   Streptococcus pyogenes NOT DETECTED NOT DETECTED Final   A.calcoaceticus-baumannii NOT DETECTED NOT DETECTED Final   Bacteroides fragilis NOT DETECTED NOT DETECTED Final   Enterobacterales NOT DETECTED NOT DETECTED Final   Enterobacter cloacae complex NOT DETECTED NOT DETECTED Final   Escherichia coli NOT DETECTED NOT DETECTED Final   Klebsiella aerogenes NOT DETECTED NOT DETECTED Final   Klebsiella oxytoca NOT DETECTED NOT DETECTED Final   Klebsiella pneumoniae NOT DETECTED NOT DETECTED Final   Proteus species NOT DETECTED NOT DETECTED Final   Salmonella species NOT DETECTED NOT DETECTED Final   Serratia marcescens NOT DETECTED NOT DETECTED Final   Haemophilus influenzae NOT DETECTED NOT DETECTED Final    Neisseria meningitidis NOT DETECTED NOT DETECTED Final   Pseudomonas aeruginosa NOT DETECTED NOT DETECTED Final   Stenotrophomonas maltophilia NOT DETECTED NOT DETECTED Final   Candida albicans NOT DETECTED NOT DETECTED Final   Candida auris NOT DETECTED NOT DETECTED Final   Candida glabrata NOT DETECTED NOT DETECTED Final   Candida krusei NOT DETECTED NOT DETECTED Final   Candida parapsilosis NOT DETECTED NOT DETECTED Final   Candida tropicalis NOT DETECTED NOT DETECTED Final   Cryptococcus neoformans/gattii NOT DETECTED NOT DETECTED Final    Comment: Performed at [facility], [address], [place], [place] [address]  Resp panel by RT-PCR (RSV, Flu A&B, Covid) Anterior Nasal Swab     Status: None   Collection Time: 07/12/23  8:35 AM   Specimen: Anterior Nasal Swab  Result Value Ref Range Status   SARS Coronavirus 2 by RT PCR NEGATIVE NEGATIVE Final    Comment: (NOTE) SARS-CoV-2 target nucleic acids are NOT DETECTED.  The SARS-CoV-2 RNA is generally detectable in upper respiratory specimens during the acute phase of infection. The lowest concentration of SARS-CoV-2 viral copies this assay can detect is 138 copies/mL. A negative result does not preclude SARS-Cov-2 infection and should not be used as the sole basis for treatment or other patient management decisions. A negative result may occur with  improper specimen collection/handling, submission of specimen other than nasopharyngeal swab, presence of viral mutation(s) within the areas targeted by this assay, and inadequate number of viral copies(<138 copies/mL). A negative result must be combined with clinical observations, patient history, and epidemiological information. The expected result is Negative.  Fact Sheet for Patients:  BloggerCourse.com  Fact Sheet for Healthcare Providers:  SeriousBroker.it  This test is no t yet approved or cleared by the [place]  FDA and  has been authorized for detection and/or diagnosis of SARS-CoV-2 by FDA under an Emergency Use Authorization (EUA). This EUA will remain  in effect (meaning this test can be used) for the duration of the COVID-19 declaration under Section 564(b)(1) of the Act, 21 U.S.C.section 360bbb-3(b)(1), unless the authorization is terminated  or revoked sooner.       Influenza A by PCR NEGATIVE NEGATIVE Final   Influenza B by PCR NEGATIVE NEGATIVE Final    Comment: (NOTE) The Xpert Xpress SARS-CoV-2/FLU/RSV plus assay is intended as an aid in the diagnosis of influenza from Nasopharyngeal swab specimens and should not be used as a sole basis for treatment. Nasal washings and aspirates are unacceptable for Xpert Xpress SARS-CoV-2/FLU/RSV testing.  Fact Sheet for Patients: BloggerCourse.com  Fact Sheet for Healthcare Providers: SeriousBroker.it  This test is not yet approved or cleared by the [place] FDA and has been authorized for detection and/or diagnosis of SARS-CoV-2 by FDA under an Emergency Use Authorization (EUA). This EUA will remain in effect (meaning this test can be used) for the duration of the COVID-19 declaration under Section 564(b)(1) of the Act, 21 U.S.C. section 360bbb-3(b)(1), unless the authorization is terminated or revoked.     Resp Syncytial Virus by PCR NEGATIVE NEGATIVE Final    Comment: (NOTE) Fact Sheet for Patients: BloggerCourse.com  Fact Sheet for Healthcare Providers: SeriousBroker.it  This test is not yet approved or cleared by the [place] FDA and has been authorized for detection and/or diagnosis of SARS-CoV-2 by FDA under an Emergency Use Authorization (EUA). This EUA will remain in effect (meaning this test can be used) for the duration of the COVID-19 declaration under Section 564(b)(1) of the Act, 21 U.S.C. section  360bbb-3(b)(1), unless the authorization is terminated or revoked.  Performed at [facility], [address] [place], [place] [address]   Urine Culture (for pregnant, neutropenic or urologic patients or patients with an indwelling urinary catheter)     Status: None   Collection Time: 07/12/23 10:01 AM   Specimen: Urine, Clean Catch  Result Value Ref Range Status   Specimen Description   Final    URINE, CLEAN CATCH Performed at [facility], [address] [place], [place] [address]    Special Requests   Final    NONE Performed at [facility], [address], [place], [place] [address]    Culture   Final    NO GROWTH Performed at [facility], [address], [place], [place] [address]    Report Status 07/13/2023 FINAL  Final  Expectorated Sputum Assessment w Gram Stain, Rflx to Resp Cult     Status: None   Collection Time: 07/12/23 10:37 AM   Specimen: Expectorated Sputum  Result Value Ref Range Status   Specimen Description EXPECTORATED SPUTUM  Final   Special Requests NONE  Final   Sputum evaluation   Final    Sputum specimen not acceptable for testing.  Please recollect.   Alease Medina I RN @ 1141 ON 07/12/23 CAL Performed at [facility], [address] [place], [place] [address]    Report Status 07/12/2023 FINAL  Final  MRSA Next Gen by PCR, Nasal     Status: None   Collection Time: 07/13/23 11:25 AM   Specimen: Nasal Mucosa; Nasal Swab  Result Value Ref Range Status   MRSA by PCR Next Gen NOT DETECTED NOT DETECTED Final    Comment: (NOTE) The GeneXpert  MRSA Assay (FDA approved for NASAL specimens only), is one component of a comprehensive MRSA colonization surveillance program. It is not intended to diagnose MRSA infection nor to guide or monitor treatment for MRSA infections. Test performance is not FDA approved in patients less than 87 years old. Performed at [facility], [address], [place], [place] [address]     Radiology Studies: No results found.  Scheduled Meds:  apixaban  5 mg Oral BID   arformoterol  15 mcg Nebulization BID   budesonide (PULMICORT) nebulizer solution  0.5 mg Nebulization BID   Chlorhexidine Gluconate Cloth  6 each Topical Daily   ferrous sulfate  325 mg Oral Q breakfast   fluticasone  2 spray Each Nare Daily   guaiFENesin  1,200 mg Oral BID   loratadine  10 mg Oral Daily   pantoprazole  40 mg Oral BID AC   sodium chloride flush  10-40 mL Intracatheter Q12H   Tbo-filgastrim (GRANIX) SQ  480 mcg Subcutaneous Daily   Continuous Infusions:  sodium chloride     ceFEPime (MAXIPIME) IV Stopped (07/13/23 0902)   vancomycin Stopped (07/13/23 0656)    LOS: 2 days   Marguerita Merles, DO [facility] Available via Epic secure chat 7am-7pm After these hours, please refer to coverage provider listed on [email or web address] 07/13/2023, 4:05 PM

## 2023-07-14 DIAGNOSIS — A419 Sepsis, unspecified organism: Secondary | ICD-10-CM | POA: Diagnosis not present

## 2023-07-14 DIAGNOSIS — D701 Agranulocytosis secondary to cancer chemotherapy: Secondary | ICD-10-CM | POA: Diagnosis not present

## 2023-07-14 DIAGNOSIS — J189 Pneumonia, unspecified organism: Secondary | ICD-10-CM | POA: Diagnosis not present

## 2023-07-14 DIAGNOSIS — E440 Moderate protein-calorie malnutrition: Secondary | ICD-10-CM

## 2023-07-14 DIAGNOSIS — I2699 Other pulmonary embolism without acute cor pulmonale: Secondary | ICD-10-CM | POA: Diagnosis not present

## 2023-07-14 LAB — CBC WITH DIFFERENTIAL/PLATELET
Abs Immature Granulocytes: 0.07 10*3/uL (ref 0.00–0.07)
Basophils Absolute: 0 10*3/uL (ref 0.0–0.1)
Basophils Relative: 1 %
Eosinophils Absolute: 0.1 10*3/uL (ref 0.0–0.5)
Eosinophils Relative: 3 %
HCT: 27.3 % — ABNORMAL LOW (ref 39.0–52.0)
Hemoglobin: 9.1 g/dL — ABNORMAL LOW (ref 13.0–17.0)
Immature Granulocytes: 2 %
Lymphocytes Relative: 9 %
Lymphs Abs: 0.3 10*3/uL — ABNORMAL LOW (ref 0.7–4.0)
MCH: 29.7 pg (ref 26.0–34.0)
MCHC: 33.3 g/dL (ref 30.0–36.0)
MCV: 89.2 fL (ref 80.0–100.0)
Monocytes Absolute: 0.3 10*3/uL (ref 0.1–1.0)
Monocytes Relative: 8 %
Neutro Abs: 3.1 10*3/uL (ref 1.7–7.7)
Neutrophils Relative %: 77 %
Platelets: 124 10*3/uL — ABNORMAL LOW (ref 150–400)
RBC: 3.06 MIL/uL — ABNORMAL LOW (ref 4.22–5.81)
RDW: 17 % — ABNORMAL HIGH (ref 11.5–15.5)
WBC: 3.9 10*3/uL — ABNORMAL LOW (ref 4.0–10.5)
nRBC: 0 % (ref 0.0–0.2)

## 2023-07-14 LAB — COMPREHENSIVE METABOLIC PANEL
ALT: 21 U/L (ref 0–44)
AST: 18 U/L (ref 15–41)
Albumin: 1.8 g/dL — ABNORMAL LOW (ref 3.5–5.0)
Alkaline Phosphatase: 124 U/L (ref 38–126)
Anion gap: 11 (ref 5–15)
BUN: 10 mg/dL (ref 8–23)
CO2: 21 mmol/L — ABNORMAL LOW (ref 22–32)
Calcium: 7.7 mg/dL — ABNORMAL LOW (ref 8.9–10.3)
Chloride: 100 mmol/L (ref 98–111)
Creatinine, Ser: 0.76 mg/dL (ref 0.61–1.24)
GFR, Estimated: >60 mL/min (ref >60)
Glucose, Bld: 96 mg/dL (ref 70–99)
Potassium: 3.2 mmol/L — ABNORMAL LOW (ref 3.5–5.1)
Sodium: 132 mmol/L — ABNORMAL LOW (ref 135–145)
Total Bilirubin: 0.8 mg/dL (ref 0.3–1.2)
Total Protein: 5.2 g/dL — ABNORMAL LOW (ref 6.5–8.1)

## 2023-07-14 LAB — LEGIONELLA PNEUMOPHILA SEROGP 1 UR AG: L. pneumophila Serogp 1 Ur Ag: NEGATIVE

## 2023-07-14 LAB — MAGNESIUM: Magnesium: 1.4 mg/dL — ABNORMAL LOW (ref 1.7–2.4)

## 2023-07-14 LAB — PHOSPHORUS: Phosphorus: 2.2 mg/dL — ABNORMAL LOW (ref 2.5–4.6)

## 2023-07-14 MED ORDER — HEPARIN SOD (PORK) LOCK FLUSH 100 UNIT/ML IV SOLN
500.0000 [IU] | INTRAVENOUS | Status: AC | PRN
  Administered 2023-07-14: 500 [IU]
  Filled 2023-07-14: qty 5

## 2023-07-14 MED ORDER — AMOXICILLIN-POT CLAVULANATE 875-125 MG PO TABS: 1.0000 | ORAL_TABLET | Freq: Two times a day (BID) | ORAL | 0 refills | Status: DC

## 2023-07-14 MED ORDER — ADULT MULTIVITAMIN W/MINERALS CH: 1.0000 | ORAL_TABLET | Freq: Every day | ORAL | Status: DC

## 2023-07-14 MED ORDER — LORATADINE 10 MG PO TABS: 10.0000 mg | ORAL_TABLET | Freq: Every day | ORAL | 0 refills | Status: DC

## 2023-07-14 MED ORDER — MAGNESIUM SULFATE 4 GM/100ML IV SOLN
4.0000 g | Freq: Once | INTRAVENOUS | Status: AC
  Administered 2023-07-14: 4 g via INTRAVENOUS
  Filled 2023-07-14: qty 100

## 2023-07-14 MED ORDER — POTASSIUM CHLORIDE CRYS ER 20 MEQ PO TBCR
40.0000 meq | EXTENDED_RELEASE_TABLET | Freq: Two times a day (BID) | ORAL | Status: DC
  Administered 2023-07-14: 40 meq via ORAL
  Filled 2023-07-14: qty 2

## 2023-07-14 MED ORDER — FLUTICASONE PROPIONATE 50 MCG/ACT NA SUSP: 2.0000 | Freq: Every day | NASAL | 0 refills | Status: DC

## 2023-07-14 MED ORDER — K PHOS MONO-SOD PHOS DI & MONO 155-852-130 MG PO TABS
500.0000 mg | ORAL_TABLET | Freq: Two times a day (BID) | ORAL | Status: DC
  Administered 2023-07-14: 500 mg via ORAL
  Filled 2023-07-14 (×2): qty 2

## 2023-07-14 MED ORDER — ENSURE ENLIVE PO LIQD
237.0000 mL | Freq: Three times a day (TID) | ORAL | Status: DC
  Administered 2023-07-14: 237 mL via ORAL

## 2023-07-14 MED ORDER — ONDANSETRON HCL 4 MG PO TABS: 4.0000 mg | ORAL_TABLET | Freq: Three times a day (TID) | ORAL | 1 refills | Status: DC | PRN

## 2023-07-14 MED ORDER — ENSURE ENLIVE PO LIQD: 237.0000 mL | Freq: Three times a day (TID) | ORAL | 12 refills | Status: DC

## 2023-07-14 MED ORDER — ADULT MULTIVITAMIN W/MINERALS CH: 1.0000 | ORAL_TABLET | Freq: Every day | ORAL | 0 refills | Status: DC

## 2023-07-14 MED ORDER — ONDANSETRON HCL 4 MG/2ML IJ SOLN: 4.0000 mg | Freq: Four times a day (QID) | INTRAMUSCULAR | Status: DC | PRN

## 2023-07-14 MED ORDER — AMOXICILLIN-POT CLAVULANATE 875-125 MG PO TABS: 1.0000 | ORAL_TABLET | Freq: Two times a day (BID) | ORAL | Status: DC

## 2023-07-14 MED ORDER — GUAIFENESIN ER 600 MG PO TB12: 600.0000 mg | ORAL_TABLET | Freq: Two times a day (BID) | ORAL | 0 refills | Status: DC

## 2023-07-14 MED ORDER — ONDANSETRON HCL 4 MG PO TABS
4.0000 mg | ORAL_TABLET | Freq: Three times a day (TID) | ORAL | Status: DC | PRN
  Administered 2023-07-14: 4 mg via ORAL
  Filled 2023-07-14: qty 1

## 2023-07-14 NOTE — Plan of Care (Signed)
  Problem: Education: Goal: Knowledge of the prescribed therapeutic regimen will improve Outcome: Adequate for Discharge   Problem: Clinical Measurements: Goal: Usual level of consciousness will be regained or maintained. Outcome: Adequate for Discharge Goal: Neurologic status will improve Outcome: Adequate for Discharge Goal: Ability to maintain intracranial pressure will improve Outcome: Adequate for Discharge   Problem: Skin Integrity: Goal: Demonstration of wound healing without infection will improve Outcome: Adequate for Discharge   Problem: Education: Goal: Knowledge of General Education information will improve Description: Including pain rating scale, medication(s)/side effects and non-pharmacologic comfort measures Outcome: Adequate for Discharge   Problem: Health Behavior/Discharge Planning: Goal: Ability to manage health-related needs will improve Outcome: Adequate for Discharge   Problem: Clinical Measurements: Goal: Ability to maintain clinical measurements within normal limits will improve Outcome: Adequate for Discharge Goal: Will remain free from infection Outcome: Adequate for Discharge Goal: Diagnostic test results will improve Outcome: Adequate for Discharge Goal: Respiratory complications will improve Outcome: Adequate for Discharge Goal: Cardiovascular complication will be avoided Outcome: Adequate for Discharge   Problem: Activity: Goal: Risk for activity intolerance will decrease Outcome: Adequate for Discharge   Problem: Nutrition: Goal: Adequate nutrition will be maintained Outcome: Adequate for Discharge   Problem: Coping: Goal: Level of anxiety will decrease Outcome: Adequate for Discharge   Problem: Elimination: Goal: Will not experience complications related to bowel motility Outcome: Adequate for Discharge Goal: Will not experience complications related to urinary retention Outcome: Adequate for Discharge   Problem: Pain  Managment: Goal: General experience of comfort will improve Outcome: Adequate for Discharge   Problem: Safety: Goal: Ability to remain free from injury will improve Outcome: Adequate for Discharge   Problem: Skin Integrity: Goal: Risk for impaired skin integrity will decrease Outcome: Adequate for Discharge   Problem: Fluid Volume: Goal: Hemodynamic stability will improve Outcome: Adequate for Discharge   Problem: Clinical Measurements: Goal: Diagnostic test results will improve Outcome: Adequate for Discharge Goal: Signs and symptoms of infection will decrease Outcome: Adequate for Discharge   Problem: Respiratory: Goal: Ability to maintain adequate ventilation will improve Outcome: Adequate for Discharge   Problem: Activity: Goal: Ability to tolerate increased activity will improve Outcome: Adequate for Discharge   Problem: Clinical Measurements: Goal: Ability to maintain a body temperature in the normal range will improve Outcome: Adequate for Discharge   Problem: Respiratory: Goal: Ability to maintain adequate ventilation will improve Outcome: Adequate for Discharge Goal: Ability to maintain a clear airway will improve Outcome: Adequate for Discharge

## 2023-07-14 NOTE — Discharge Summary (Signed)
Physician Discharge Summary   Patient: Matthew Rivera MRN: [id number] DOB: 1961/03/22  Admit date:     07/11/2023  Discharge date: 07/14/23  Discharge Physician: Marguerita Merles, DO   PCP: [name], MD   Recommendations at discharge:   Follow up with PCP within 1-2 weeks and repeat CBC, CMP, Mag, Phos within 1 week Follow up with Medical Oncology within 1-2 weeks Repeat chest x-ray in 3 to 6 weeks  Discharge Diagnoses: Principal Problem:   Sepsis (HCC) Active Problems:   HCAP (healthcare-associated pneumonia)   GERD (gastroesophageal reflux disease)   Acute pulmonary embolism (HCC)   Hypomagnesemia   Anemia associated with chemotherapy   Neutropenia (HCC)   Chemotherapy induced neutropenia (HCC)   Malnutrition of moderate degree  Resolved Problems:   * No resolved hospital problems. [facility] Course: The patient is a unfortunate 62 year old pleasant Caucasian male with a past medical history significant for bilateral deafness, GERD, non-small cell lung cancer with brain metastasis status post craniotomy as well as recent hospitalization for acute respiratory failure with hypoxia secondary community-acquired pneumonia and PE who presented to the ED with generalized malaise, and general pain with concern for sepsis from HCAP.  He has noted to have chemotherapy on 07/05/2023 however he failed to improve in terms of energy and appetite acutely does postchemotherapy.  He presented to the ED and chest x-ray done and showed a worsening bilateral consolidation infiltrate and he was placed on antibiotics empirically.  He subsequently also was placed on Granix and he improved after several days of treatment.  He is now stable and will need to follow-up with his PCP and medical oncologist within the coming weeks and repeat CXR within 3-6 weeks.  Assessment and Plan:  Sepsis secondary to HCAP, POA, improved  -Patient presenting with concern for sepsis meeting criteria with tachycardia,  tachypnea, chest x-ray with concern for bilateral consolidation/infiltrate.  Lactic acid level noted to be normal. -Patient noted to have presented with generalized weakness and decreased oral intake. -Urine strep pneumococcus antigen negative. -Urine Legionella antigen pending. -Check a sputum Gram stain and culture. -SARS coronavirus 2 PCR negative, influenza A and B PCR negative, RSV PCR negative. -Blood cultures pending. -Continue empiric IV vancomycin and IV cefepime and change to Oral Abx at D/C  -Continue Brovana.-Discontinued Incruse and place on Pulmicort. -Place on Mucinex, scheduled Xopenex and Atrovent nebs, Claritin, PPI. -Repeat CXR in the AM SpO2: 100 % O2 Flow Rate (L/min): 3 L/min -Supportive care and patient improved -Repeat chest x-ray in 3 to 6 weeks follow-up with PCP within 1 week   Chemotherapy Pancytopenia -Patient denies any overt bleeding. -CBC Trend: Recent Labs  Lab 06/21/23 1202 06/27/23 0844 07/05/23 0820 07/05/23 0820 07/11/23 1315 07/12/23 0314 07/12/23 0856 07/13/23 0452 07/14/23 0304  WBC 4.2 3.3* 4.6  --  1.0* 1.1*  --  1.8* 3.9*  HGB 7.6* 8.0* 9.8*   < > 7.7* 7.0*   < > 8.8* 9.1*  HCT 22.5* 24.3* 29.0*  --  23.6* 21.2*   < > 26.0* 27.3*  MCV 91.5 95.3 94.8  --  93.7 93.8  --  87.8 89.2  PLT 369 199 183   < > 167 165  --  129* 124*   < > = values in this interval not displayed.  -Anemia Panel was done and showed an iron level of 43, UIBC of 82, TIBC 125, saturation of 35%, ferritin level 2410, folate level 24.1, vitamin B12 763 -Likely secondary to chemotherapy. -Continue  Granix. -Patient pancultured. -C/w Ferrous Sulfate 325 mg po qHS -Continue empiric IV antibiotics secondary to problem #1. -Transfused 2 units PRBCs as hemoglobin currently improved and stable -Follow up with Oncology as an outpatient and stop Granix given ANC of 3100.   Hypokalemia -Patient's K+ Level Trend: Recent Labs  Lab 06/21/23 1202 06/27/23 0844  07/05/23 0820 07/11/23 1315 07/12/23 0314 07/13/23 0452 07/14/23 1020  K 4.3 4.0 3.8 3.7 3.4* 3.5 3.2*  -Replete with po KCL 40 mEQ BID x2 and po K Phos Neutral 500 mg BID x2 -Continue to Monitor and Replete as Necessary -Repeat CMP within 1 week  Hypomagnesemia -Patient's Mag Level Trend: Recent Labs  Lab 07/11/23 1315 07/12/23 0856 07/13/23 0452 07/14/23 1020  MG 1.2* 1.4* 1.7 1.4*  -Replete with IV Mag Sulfate 4 grams prior tod D/C -Continue to Monitor and Replete as Necessary -Repeat Mag within 1 week  Hyponatremia -Likely secondary to hypovolemic hyponatremia versus SIADH secondary to lung cancer. -Na+ Trend: Recent Labs  Lab 06/21/23 1202 06/27/23 0844 07/05/23 0820 07/11/23 1315 07/12/23 0314 07/13/23 0452 07/14/23 1020  NA 134* 134* 136 132* 128* 129* 132*  -Check a urine sodium, urine creatinine.  -Was Getting IVF with NS at 125 mL/hr and will reduce to 75 mL/hr -Follow up CMP within 1 week   History of Septic Emboli August 2024 -Continue Apixaban 5 mg po BID.   Non-small cell lung cancer with brain mets status postcraniotomy 6/30 and intracranial radiation -Patient with ongoing chemotherapy under the care of Dr. Shirline Frees last on 07/05/2023.  Patient is on carboplatin, Alimta need to and Keytruda -Continue current home pain regimen with Oxycodone 10 mg po q4hprn for Pain. -Oncology informed of admission via epic.  And recommending to hold chemotherapy regimen until further discussion outpatient setting and patient to follow up within the coming weeks   BPH -C/w Tamsulosin 0.4 mg po qHSprn Urinary Flow Issues  Micrococcus Bacteremia  -Discussed with ID and feel it is a contaminant  -Repeat Blood Cx x2 showed NGTD at 3 Days. -C/w Oral Abx at D/C   GERD/GI Prophylaxis -C/w PPI with Pantoprazole 40 mg po BID  Poor po Intake and Moderate Malnutrition -Consult Nutritionist for further evaluation  Nutrition Status: Nutrition Problem: Moderate  Malnutrition Etiology: chronic illness (lung cancer with brain mets) Signs/Symptoms: moderate fat depletion, moderate muscle depletion, percent weight loss Interventions: Ensure Enlive (each supplement provides 350kcal and 20 grams of protein), MVI   Nutrition Documentation    Flowsheet Row ED to Hosp-Admission (Discharged) from 07/11/2023 in [place] LONG 4TH FLOOR PROGRESSIVE CARE AND UROLOGY  Nutrition Problem Moderate Malnutrition  Etiology chronic illness  [lung cancer with brain mets]  Nutrition Goal Patient will meet greater than or equal to 90% of their needs  Interventions Ensure Enlive (each supplement provides 350kcal and 20 grams of protein), MVI      Consultants: As delineated as above Procedures performed: As delineated as above  Disposition: Home Diet recommendation:  Cardiac diet DISCHARGE MEDICATION: Allergies as of 07/14/2023   No Known Allergies      Medication List     STOP taking these medications    ondansetron 4 MG disintegrating tablet Commonly known as: ZOFRAN-ODT       TAKE these medications    albuterol 108 (90 Base) MCG/ACT inhaler Commonly known as: VENTOLIN HFA Inhale 2 puffs into the lungs every 4 (four) hours as needed for wheezing or shortness of breath.   amoxicillin-clavulanate 875-125 MG tablet Commonly known as: AUGMENTIN  Take 1 tablet by mouth every 12 (twelve) hours for 4 days.   apixaban 5 MG Tabs tablet Commonly known as: ELIQUIS Take 1 tablet (5 mg total) by mouth 2 (two) times daily. This prescription will not start until 9/20 so can place on hold What changed:  additional instructions Another medication with the same name was removed. Continue taking this medication, and follow the directions you see here.   feeding supplement Liqd Take 237 mLs by mouth 3 (three) times daily between meals.   ferrous sulfate 325 (65 FE) MG tablet Take 325 mg by mouth daily with breakfast.   fluticasone 50 MCG/ACT nasal spray Commonly  known as: FLONASE Place 2 sprays into both nostrils daily.   folic acid 1 MG tablet Commonly known as: FOLVITE Take 1 tablet (1 mg total) by mouth daily.   guaiFENesin 600 MG 12 hr tablet Commonly known as: MUCINEX Take 1 tablet (600 mg total) by mouth 2 (two) times daily for 5 days.   lidocaine-prilocaine cream Commonly known as: EMLA Apply 1 Application topically as needed. What changed: reasons to take this   loratadine 10 MG tablet Commonly known as: CLARITIN Take 1 tablet (10 mg total) by mouth daily.   LORazepam 0.5 MG tablet Commonly known as: ATIVAN Take 1 tablet (0.5 mg total) by mouth as needed for anxiety. Take 1-2 tablet by mouth 20 minutes before MRI or radiation treatment. What changed:  when to take this additional instructions   multivitamin with minerals Tabs tablet Take 1 tablet by mouth daily.   nicotine 14 mg/24hr patch Commonly known as: NICODERM CQ - dosed in mg/24 hours Place 1 patch (14 mg total) onto the skin daily.   ondansetron 4 MG tablet Commonly known as: Zofran Take 1 tablet (4 mg total) by mouth every 8 (eight) hours as needed for nausea or vomiting.   Oxycodone HCl 10 MG Tabs Take 10 mg by mouth every 4 (four) hours as needed (for pain).   pantoprazole 40 MG tablet Commonly known as: PROTONIX Take 1 tablet (40 mg total) by mouth 2 (two) times daily. What changed: when to take this   prochlorperazine 10 MG tablet Commonly known as: COMPAZINE Take 1 tablet (10 mg total) by mouth every 6 (six) hours as needed for nausea or vomiting.   Stiolto Respimat 2.5-2.5 MCG/ACT Aers Generic drug: Tiotropium Bromide-Olodaterol Inhale 2 puffs into the lungs daily.   tamsulosin 0.4 MG Caps capsule Commonly known as: FLOMAX Take 0.4 mg by mouth at bedtime as needed (urinary flow issues).   traMADol 50 MG tablet Commonly known as: ULTRAM Take 50-100 mg by mouth 2 (two) times daily as needed (for pain).   TYLENOL 500 MG tablet Generic drug:  acetaminophen Take 500-1,000 mg by mouth every 6 (six) hours as needed for mild pain or headache.       Discharge Exam: Filed Weights   07/11/23 1208  Weight: 62 kg   Vitals:   07/14/23 0923 07/14/23 1205  BP:  94/61  Pulse:  99  Resp:  20  Temp:  (!) 97.4 F (36.3 C)  SpO2: 93% 100%   Examination: Physical Exam:  Constitutional: WN/WD male in NAD Respiratory: Diminished to auscultation bilaterally, no wheezing, rales, rhonchi or crackles. Normal respiratory effort and patient is not tachypenic. No accessory muscle use.  Cardiovascular: RRR, no murmurs / rubs / gallops. S1 and S2 auscultated. No extremity edema.   Abdomen: Soft, non-tender, non-distended. Bowel sounds positive.  GU: Deferred. Musculoskeletal: No  clubbing / cyanosis of digits/nails. No joint deformity upper and lower extremities.  Skin: No rashes, lesions, ulcers on a limited skin evaluation. No induration; Warm and dry.  Neurologic: CN 2-12 grossly intact with no focal deficits. Romberg sign and cerebellar reflexes not assessed.  Psychiatric: Normal judgment and insight. Alert and oriented x 3. Normal mood and appropriate affect.   Condition at discharge: stable  The results of significant diagnostics from this hospitalization (including imaging, microbiology, ancillary and laboratory) are listed below for reference.   Imaging Studies: DG Chest Portable 1 View  Result Date: 07/11/2023 CLINICAL DATA:  Cough.  Recent pneumonia. EXAM: PORTABLE CHEST 1 VIEW COMPARISON:  AP chest 06/01/2023, 01/15/2011; CT chest 06/01/2023 FINDINGS: Right chest wall porta catheter with tip overlying the mid superior vena cava, unchanged from 06/01/2023. Cardiac silhouette and mediastinal contours are within normal limits. Mild interval increase in density of heterogeneous airspace opacification throughout the inferior 40% of the right lung. Mildly increased density of lower density heterogeneous airspace opacity throughout the  inferior 50% of the left lung. This likely represents worsening of a similar distribution multifocal pneumonia seen on prior 06/01/2023 radiograph and CT. No large pleural effusion is seen.  No pneumothorax. No acute skeletal abnormality. IMPRESSION: Mild interval increase in density of heterogeneous airspace opacification throughout the inferior 40% of the right lung and inferior 50% of the left lung. This likely represents worsening of a similar distribution multifocal pneumonia seen on prior 06/01/2023 radiograph and CT. Electronically Signed   By: Neita Garnet M.D.   On: 07/11/2023 15:38   DG Hand 2 View Right  Result Date: 07/11/2023 CLINICAL DATA:  Right hand swelling and pain. EXAM: RIGHT HAND - 2 VIEW COMPARISON:  None Available. FINDINGS: Pulse oximeter overlies the distal aspect of the index finger, obscuring the bones on frontal view. Normal alignment. Neutral ulnar variance. Mild dorsal carpometacarpal degenerative spurring, likely at the third digit. No acute fracture or dislocation. IMPRESSION: Mild dorsal carpometacarpal degenerative spurring, likely at the third digit. Electronically Signed   By: Neita Garnet M.D.   On: 07/11/2023 15:34   DG Ankle 2 Views Right  Result Date: 07/11/2023 CLINICAL DATA:  Right foot swelling and pain. EXAM: RIGHT ANKLE - 2 VIEW COMPARISON:  Right ankle radiographs 02/11/2021 FINDINGS: Interval lateral plate and screw fixation of the prior distal fibular diaphyseal fracture with improved, now anatomic alignment complete healing. Single screw fixation of the prior medial malleolar fracture, with normal alignment and complete healing. 3 mm chronic ossicle medial to the medial malleolus. The ankle mortise is symmetric and intact. No acute fracture is seen. No dislocation. IMPRESSION: Compared to 02/11/2021: 1. Interval ORIF of the prior distal fibular diaphyseal and medial malleolar fractures with improved, now anatomic alignment and complete healing. 2. No acute  fracture. Electronically Signed   By: Neita Garnet M.D.   On: 07/11/2023 15:32    Microbiology: Results for orders placed or performed during the hospital encounter of 07/11/23  Culture, blood (routine x 2)     Status: None   Collection Time: 07/11/23  2:00 PM   Specimen: BLOOD  Result Value Ref Range Status   Specimen Description BLOOD RIGHT ANTECUBITAL  Final   Special Requests   Final    BOTTLES DRAWN AEROBIC AND ANAEROBIC Blood Culture adequate volume   Culture   Final    NO GROWTH 5 DAYS Performed at [facility], [address] [place], [place] [address]    Report Status 07/16/2023 FINAL  Final  Culture, blood (routine x 2)     Status: Abnormal   Collection Time: 07/11/23  2:37 PM   Specimen: BLOOD  Result Value Ref Range Status   Specimen Description   Final    BLOOD BLOOD LEFT ARM Performed at [facility], [address], [place], [place] [address]    Special Requests   Final    BOTTLES DRAWN AEROBIC AND ANAEROBIC Blood Culture adequate volume   Culture  Setup Time   Final    GRAM POSITIVE COCCI IN CLUSTERS BOTTLES DRAWN AEROBIC ONLY CRITICAL RESULT CALLED TO, READ BACK BY AND VERIFIED WITH: M [email or web address]  07/13/23 MK    Culture (A)  Final    MICROCOCCUS SPECIES MICROCOCCUS LUTEUS Standardized susceptibility testing for this organism is not available. Performed at [facility], [address], [place], [place] [address]    Report Status 07/13/2023 FINAL  Final  Blood Culture ID Panel (Reflexed)     Status: None   Collection Time: 07/11/23  2:37 PM  Result Value Ref Range Status   Enterococcus faecalis NOT DETECTED NOT DETECTED Final   Enterococcus Faecium NOT DETECTED NOT DETECTED Final   Listeria monocytogenes NOT DETECTED NOT DETECTED Final   Staphylococcus species NOT DETECTED NOT DETECTED Final   Staphylococcus aureus (BCID) NOT DETECTED NOT DETECTED Final   Staphylococcus epidermidis NOT DETECTED NOT DETECTED Final    Staphylococcus lugdunensis NOT DETECTED NOT DETECTED Final   Streptococcus species NOT DETECTED NOT DETECTED Final   Streptococcus agalactiae NOT DETECTED NOT DETECTED Final   Streptococcus pneumoniae NOT DETECTED NOT DETECTED Final   Streptococcus pyogenes NOT DETECTED NOT DETECTED Final   A.calcoaceticus-baumannii NOT DETECTED NOT DETECTED Final   Bacteroides fragilis NOT DETECTED NOT DETECTED Final   Enterobacterales NOT DETECTED NOT DETECTED Final   Enterobacter cloacae complex NOT DETECTED NOT DETECTED Final   Escherichia coli NOT DETECTED NOT DETECTED Final   Klebsiella aerogenes NOT DETECTED NOT DETECTED Final   Klebsiella oxytoca NOT DETECTED NOT DETECTED Final   Klebsiella pneumoniae NOT DETECTED NOT DETECTED Final   Proteus species NOT DETECTED NOT DETECTED Final   Salmonella species NOT DETECTED NOT DETECTED Final   Serratia marcescens NOT DETECTED NOT DETECTED Final   Haemophilus influenzae NOT DETECTED NOT DETECTED Final   Neisseria meningitidis NOT DETECTED NOT DETECTED Final   Pseudomonas aeruginosa NOT DETECTED NOT DETECTED Final   Stenotrophomonas maltophilia NOT DETECTED NOT DETECTED Final   Candida albicans NOT DETECTED NOT DETECTED Final   Candida auris NOT DETECTED NOT DETECTED Final   Candida glabrata NOT DETECTED NOT DETECTED Final   Candida krusei NOT DETECTED NOT DETECTED Final   Candida parapsilosis NOT DETECTED NOT DETECTED Final   Candida tropicalis NOT DETECTED NOT DETECTED Final   Cryptococcus neoformans/gattii NOT DETECTED NOT DETECTED Final    Comment: Performed at [facility], [address], [place], [place] [address]  Resp panel by RT-PCR (RSV, Flu A&B, Covid) Anterior Nasal Swab     Status: None   Collection Time: 07/12/23  8:35 AM   Specimen: Anterior Nasal Swab  Result Value Ref Range Status   SARS Coronavirus 2 by RT PCR NEGATIVE NEGATIVE Final    Comment: (NOTE) SARS-CoV-2 target nucleic acids are NOT DETECTED.  The SARS-CoV-2 RNA  is generally detectable in upper respiratory specimens during the acute phase of infection. The lowest concentration of SARS-CoV-2 viral copies this assay can detect is 138 copies/mL. A negative result does not preclude SARS-Cov-2 infection  and should not be used as the sole basis for treatment or other patient management decisions. A negative result may occur with  improper specimen collection/handling, submission of specimen other than nasopharyngeal swab, presence of viral mutation(s) within the areas targeted by this assay, and inadequate number of viral copies(<138 copies/mL). A negative result must be combined with clinical observations, patient history, and epidemiological information. The expected result is Negative.  Fact Sheet for Patients:  BloggerCourse.com  Fact Sheet for Healthcare Providers:  SeriousBroker.it  This test is no t yet approved or cleared by the [place] FDA and  has been authorized for detection and/or diagnosis of SARS-CoV-2 by FDA under an Emergency Use Authorization (EUA). This EUA will remain  in effect (meaning this test can be used) for the duration of the COVID-19 declaration under Section 564(b)(1) of the Act, 21 U.S.C.section 360bbb-3(b)(1), unless the authorization is terminated  or revoked sooner.       Influenza A by PCR NEGATIVE NEGATIVE Final   Influenza B by PCR NEGATIVE NEGATIVE Final    Comment: (NOTE) The Xpert Xpress SARS-CoV-2/FLU/RSV plus assay is intended as an aid in the diagnosis of influenza from Nasopharyngeal swab specimens and should not be used as a sole basis for treatment. Nasal washings and aspirates are unacceptable for Xpert Xpress SARS-CoV-2/FLU/RSV testing.  Fact Sheet for Patients: BloggerCourse.com  Fact Sheet for Healthcare Providers: SeriousBroker.it  This test is not yet approved or cleared by the  [place] FDA and has been authorized for detection and/or diagnosis of SARS-CoV-2 by FDA under an Emergency Use Authorization (EUA). This EUA will remain in effect (meaning this test can be used) for the duration of the COVID-19 declaration under Section 564(b)(1) of the Act, 21 U.S.C. section 360bbb-3(b)(1), unless the authorization is terminated or revoked.     Resp Syncytial Virus by PCR NEGATIVE NEGATIVE Final    Comment: (NOTE) Fact Sheet for Patients: BloggerCourse.com  Fact Sheet for Healthcare Providers: SeriousBroker.it  This test is not yet approved or cleared by the [place] FDA and has been authorized for detection and/or diagnosis of SARS-CoV-2 by FDA under an Emergency Use Authorization (EUA). This EUA will remain in effect (meaning this test can be used) for the duration of the COVID-19 declaration under Section 564(b)(1) of the Act, 21 U.S.C. section 360bbb-3(b)(1), unless the authorization is terminated or revoked.  Performed at [facility], [address], [place], [place] [address]   Urine Culture (for pregnant, neutropenic or urologic patients or patients with an indwelling urinary catheter)     Status: None   Collection Time: 07/12/23 10:01 AM   Specimen: Urine, Clean Catch  Result Value Ref Range Status   Specimen Description   Final    URINE, CLEAN CATCH Performed at [facility], [address], [place], [place] [address]    Special Requests   Final    NONE Performed at [facility], [address], [place], [place] [address]    Culture   Final    NO GROWTH Performed at [facility], [address] [place], [place] [address]    Report Status 07/13/2023 FINAL  Final  Expectorated Sputum Assessment w Gram Stain, Rflx to Resp Cult     Status: None   Collection Time: 07/12/23 10:37 AM   Specimen: Expectorated Sputum  Result  Value Ref Range Status   Specimen Description EXPECTORATED SPUTUM  Final   Special Requests NONE  Final   Sputum  evaluation   Final    Sputum specimen not acceptable for testing.  Please recollect.   Alease Medina I RN @ 1141 ON 07/12/23 CAL Performed at [facility], [address] [place], [place] [address]    Report Status 07/12/2023 FINAL  Final  MRSA Next Gen by PCR, Nasal     Status: None   Collection Time: 07/13/23 11:25 AM   Specimen: Nasal Mucosa; Nasal Swab  Result Value Ref Range Status   MRSA by PCR Next Gen NOT DETECTED NOT DETECTED Final    Comment: (NOTE) The GeneXpert MRSA Assay (FDA approved for NASAL specimens only), is one component of a comprehensive MRSA colonization surveillance program. It is not intended to diagnose MRSA infection nor to guide or monitor treatment for MRSA infections. Test performance is not FDA approved in patients less than 6 years old. Performed at [facility], [address] [place], [place] [address]   Culture, blood (Routine X 2) w Reflex to ID Panel     Status: None (Preliminary result)   Collection Time: 07/13/23  3:58 PM   Specimen: BLOOD LEFT ARM  Result Value Ref Range Status   Specimen Description   Final    BLOOD LEFT ARM Performed at [facility], [address] [place], [place] [address]    Special Requests   Final    BOTTLES DRAWN AEROBIC AND ANAEROBIC Blood Culture adequate volume Performed at [facility], [address], [place], [place] [address]    Culture   Final    NO GROWTH 3 DAYS Performed at [facility], [address] [place], [place] [address]    Report Status PENDING  Incomplete  Culture, blood (Routine X 2) w Reflex to ID Panel     Status: None (Preliminary result)   Collection Time: 07/13/23  4:03 PM   Specimen: BLOOD RIGHT ARM  Result Value Ref Range Status   Specimen Description   Final    BLOOD RIGHT ARM Performed at [facility], [address], [place], [place] [address]    Special Requests   Final    BOTTLES DRAWN AEROBIC AND ANAEROBIC Blood Culture adequate volume Performed at [facility], [address] [place], [place] [address]    Culture   Final    NO GROWTH 3 DAYS Performed at [facility], [address] [place], [place] [address]    Report Status PENDING  Incomplete   Labs: CBC: Recent Labs  Lab 07/11/23 1315 07/12/23 0314 07/12/23 0856 07/12/23 2255 07/13/23 0452 07/14/23 0304  WBC 1.0* 1.1*  --   --  1.8* 3.9*  NEUTROABS 0.5*  --   --   --  0.9* 3.1  HGB 7.7* 7.0* 6.6* 8.7* 8.8* 9.1*  HCT 23.6* 21.2* 20.2* 26.0* 26.0* 27.3*  MCV 93.7 93.8  --   --  87.8 89.2  PLT 167 165  --   --  129* 124*   Basic Metabolic Panel: Recent Labs  Lab 07/11/23 1315 07/12/23 0314 07/12/23 0856 07/13/23 0452 07/14/23 1020  NA 132* 128*  --  129* 132*  K 3.7 3.4*  --  3.5 3.2*  CL 96* 96*  --  98 100  CO2 27 24  --  23 21*  GLUCOSE 96 97  --  89 96  BUN 13 12  --  10 10  CREATININE 0.69 0.86  --  0.70 0.76  CALCIUM 8.0*  7.7*  --  7.5* 7.7*  MG 1.2*  --  1.4* 1.7 1.4*  PHOS  --   --   --   --  2.2*   Liver Function Tests: Recent Labs  Lab 07/11/23 1315 07/14/23 1020  AST 29 18  ALT 36 21  ALKPHOS 157* 124  BILITOT 1.1 0.8  PROT 6.2* 5.2*  ALBUMIN 2.2* 1.8*   CBG: No results for input(s): "GLUCAP" in the last 168 hours.  Discharge time spent: greater than 30 minutes.  Signed: Marguerita Merles, DO [facility] 07/16/2023

## 2023-07-14 NOTE — Progress Notes (Signed)
Mobility Specialist - Progress Note   07/14/23 1236  Mobility  Activity Ambulated independently in hallway  Level of Assistance Independent  Assistive Device None  Distance Ambulated (ft) 500 ft  Activity Response Tolerated well  Mobility Referral Yes  $Mobility charge 1 Mobility  Mobility Specialist Start Time (ACUTE ONLY) 1223  Mobility Specialist Stop Time (ACUTE ONLY) 1235  Mobility Specialist Time Calculation (min) (ACUTE ONLY) 12 min   Pt received in bed and agreeable to mobility. Pt took x3 standing rest breaks throughout session. Pt to EOB after session with all needs met.    Pre-mobility: 111 HR, 89% SpO2 (3L [place]) During mobility: 123 HR, 91% SpO2 (3L [place]) Post-mobility: 107 HR  [facility] Paediatric nurse

## 2023-07-14 NOTE — Progress Notes (Signed)
Initial Nutrition Assessment  DOCUMENTATION CODES:   Non-severe (moderate) malnutrition in context of chronic illness  INTERVENTION:   -Ensure Enlive po TID, each supplement provides 350 kcal and 20 grams of protein  -MVI with minerals daily -RD provided "High Calorie, High Protein Nutrition Therapy" handout from [facility] Nutrition Care Manual; reinforced recommendations from cancer center RD and forwarded note to her; encouraged continued RD follow-up at cancer center -Continue liberalized diet of regular  NUTRITION DIAGNOSIS:   Moderate Malnutrition related to chronic illness (lung cancer with brain mets) as evidenced by moderate fat depletion, moderate muscle depletion, percent weight loss.  GOAL:   Patient will meet greater than or equal to 90% of their needs  MONITOR:   PO intake, Supplement acceptance  REASON FOR ASSESSMENT:   Consult Assessment of nutrition requirement/status, Diet education  ASSESSMENT:   Pt with medical history significant for deafness, GERD, non-small cell lung cancer with brain metastasis status post craniotomy as well as recent hospital stay for acute hypoxic respiratory failure due to combination of community-acquired pneumonia and pulmonary embolism who is now being admitted to the hospital with continued malaise, generalized pain and concern for sepsis from healthcare acquired pneumonia.  Pt admitted with sepsis and HCAP.   Reviewed I/O's: +1.8 L x 24 hours and +4.4 L since admission  UOP: 1.1 L x 24 hours   Pt currently receiving chemotherapy from lung cancer with brain mets.   Spoke with pt and wife at bedside. Pt is pleasant and in good spirits and very appreciative of hospital care he has received. Pt reports feeling better and "ready to home today". Intake has improved significantly since admission. He consumed 100% of his lunch. Documented meal completions 50-100%.   Per pt, he was making progress "until my third chemo turned everything  upside down". Pt shares that he has met with the RD at the cancer center and took her advice to heart including liberalized diet and protein shakes. Pt shares "I eat whatever I want". Wife shares that pt prefers the Boost High protein shakes.   Pt admits to weight loss, but reports recent wt gain due to improved oral intake. Reviewed wt hx; pt has experienced a 7.9% wt loss over the past 2 months, which is significant for time frame.   Case discussed with RN and MD; requesting RD assessment and diet education prior to discharge today. Findings and recommendations discussed. Also forwarded note to cancer center RD who is following him.   Discussed importance of good meal and supplement intake to promote healing. Discussed importance of liberalized diet to maximize oral intake and to continue to make eating a pleasurable experience. Pt amenable to continues supplements here and at discharge. Encouraged RD follow up; pt is looking forward to RD follow-up at cancer center. He expressed appreciation for visit and support of his wife.   Medications reviewed and include ferrous sulfate, phosphorus, potassium chloride, magnesium sulfate, and 0.9% sodium chloride infusion @ 75 ml/hr.   Lab Results  Component Value Date   HGBA1C  01/16/2011    5.1 (NOTE)                                                                       According to the  ADA Clinical Practice Recommendations for 2011, when HbA1c is used as a screening test:   >=6.5%   Diagnostic of Diabetes Mellitus           (if abnormal result  is confirmed)  5.7-6.4%   Increased risk of developing Diabetes Mellitus  References:Diagnosis and Classification of Diabetes Mellitus,Diabetes Care,2011,34(Suppl 1):S62-S69 and Standards of Medical Care in         Diabetes - 2011,Diabetes Care,2011,34  (Suppl 1):S11-S61.   PTA DM medications are none.   Labs reviewed: Na: 132, K: 3.2, Phos: 2.2, Mg: 1.4, CBGS: 125 (inpatient orders for glycemic control are  none).    NUTRITION - FOCUSED PHYSICAL EXAM:  Flowsheet Row Most Recent Value  Orbital Region Moderate depletion  Upper Arm Region Moderate depletion  Thoracic and Lumbar Region Moderate depletion  Buccal Region Moderate depletion  Temple Region Moderate depletion  Clavicle Bone Region Moderate depletion  Clavicle and Acromion Bone Region Moderate depletion  Scapular Bone Region Moderate depletion  Dorsal Hand Moderate depletion  Patellar Region Moderate depletion  Anterior Thigh Region Moderate depletion  Posterior Calf Region Moderate depletion  Edema (RD Assessment) Mild  Hair Reviewed  Eyes Reviewed  Mouth Reviewed  Skin Reviewed  Nails Reviewed       Diet Order:   Diet Order             Diet regular Room service appropriate? Yes; Fluid consistency: Thin  Diet effective now                   EDUCATION NEEDS:   Education needs have been addressed  Skin:  Skin Assessment: Reviewed RN Assessment  Last BM:  07/14/23  Height:   Ht Readings from Last 1 Encounters:  07/11/23 5\' 11"  (1.803 m)    Weight:   Wt Readings from Last 1 Encounters:  07/11/23 62 kg    Ideal Body Weight:  78.2 kg  BMI:  Body mass index is 19.06 kg/m.  Estimated Nutritional Needs:   Kcal:  1850-2050  Protein:  90-105 grams  Fluid:  > 1.8 L    [name], RD, LDN, CDCES Registered Dietitian III Certified Diabetes Care and Education Specialist Please refer to [facility] for RD and/or RD on-call/weekend/after hours pager

## 2023-07-14 NOTE — Discharge Instructions (Signed)

## 2023-07-14 NOTE — Plan of Care (Signed)
  Problem: Clinical Measurements: Goal: Respiratory complications will improve Outcome: Progressing   Problem: Activity: Goal: Risk for activity intolerance will decrease Outcome: Progressing   Problem: Coping: Goal: Level of anxiety will decrease Outcome: Progressing   Problem: Pain Managment: Goal: General experience of comfort will improve Outcome: Progressing   Problem: Clinical Measurements: Goal: Signs and symptoms of infection will decrease Outcome: Progressing

## 2023-07-16 LAB — CULTURE, BLOOD (ROUTINE X 2)
Culture: NO GROWTH
Special Requests: ADEQUATE

## 2023-07-17 ENCOUNTER — Observation Stay (HOSPITAL_COMMUNITY): Payer: 59

## 2023-07-17 ENCOUNTER — Encounter (HOSPITAL_COMMUNITY): Payer: Self-pay

## 2023-07-17 ENCOUNTER — Other Ambulatory Visit: Payer: Self-pay

## 2023-07-17 ENCOUNTER — Inpatient Hospital Stay (HOSPITAL_COMMUNITY)
Admission: EM | Admit: 2023-07-17 | Discharge: 2023-07-27 | DRG: 193 | Disposition: A | Discharge status: Hospice | Payer: 59 | Attending: Internal Medicine | Admitting: Internal Medicine

## 2023-07-17 ENCOUNTER — Emergency Department (HOSPITAL_COMMUNITY): Payer: 59

## 2023-07-17 DIAGNOSIS — Z23 Encounter for immunization: Secondary | ICD-10-CM | POA: Diagnosis not present

## 2023-07-17 DIAGNOSIS — D630 Anemia in neoplastic disease: Secondary | ICD-10-CM | POA: Diagnosis present

## 2023-07-17 DIAGNOSIS — E43 Unspecified severe protein-calorie malnutrition: Secondary | ICD-10-CM | POA: Diagnosis present

## 2023-07-17 DIAGNOSIS — C7931 Secondary malignant neoplasm of brain: Secondary | ICD-10-CM | POA: Diagnosis present

## 2023-07-17 DIAGNOSIS — Z8701 Personal history of pneumonia (recurrent): Secondary | ICD-10-CM

## 2023-07-17 DIAGNOSIS — Z86711 Personal history of pulmonary embolism: Secondary | ICD-10-CM

## 2023-07-17 DIAGNOSIS — J189 Pneumonia, unspecified organism: Secondary | ICD-10-CM | POA: Diagnosis present

## 2023-07-17 DIAGNOSIS — I2782 Chronic pulmonary embolism: Secondary | ICD-10-CM | POA: Diagnosis not present

## 2023-07-17 DIAGNOSIS — Z95828 Presence of other vascular implants and grafts: Secondary | ICD-10-CM | POA: Diagnosis not present

## 2023-07-17 DIAGNOSIS — F17210 Nicotine dependence, cigarettes, uncomplicated: Secondary | ICD-10-CM | POA: Diagnosis present

## 2023-07-17 DIAGNOSIS — E873 Alkalosis: Secondary | ICD-10-CM | POA: Diagnosis present

## 2023-07-17 DIAGNOSIS — R54 Age-related physical debility: Secondary | ICD-10-CM | POA: Diagnosis present

## 2023-07-17 DIAGNOSIS — Z515 Encounter for palliative care: Secondary | ICD-10-CM | POA: Diagnosis not present

## 2023-07-17 DIAGNOSIS — L89152 Pressure ulcer of sacral region, stage 2: Secondary | ICD-10-CM | POA: Diagnosis not present

## 2023-07-17 DIAGNOSIS — J440 Chronic obstructive pulmonary disease with acute lower respiratory infection: Secondary | ICD-10-CM | POA: Diagnosis present

## 2023-07-17 DIAGNOSIS — L270 Generalized skin eruption due to drugs and medicaments taken internally: Secondary | ICD-10-CM | POA: Diagnosis present

## 2023-07-17 DIAGNOSIS — F32A Depression, unspecified: Secondary | ICD-10-CM | POA: Diagnosis present

## 2023-07-17 DIAGNOSIS — J9621 Acute and chronic respiratory failure with hypoxia: Secondary | ICD-10-CM | POA: Diagnosis present

## 2023-07-17 DIAGNOSIS — Z79899 Other long term (current) drug therapy: Secondary | ICD-10-CM

## 2023-07-17 DIAGNOSIS — R21 Rash and other nonspecific skin eruption: Secondary | ICD-10-CM | POA: Diagnosis present

## 2023-07-17 DIAGNOSIS — G9389 Other specified disorders of brain: Secondary | ICD-10-CM

## 2023-07-17 DIAGNOSIS — Y95 Nosocomial condition: Secondary | ICD-10-CM | POA: Diagnosis present

## 2023-07-17 DIAGNOSIS — R1310 Dysphagia, unspecified: Secondary | ICD-10-CM | POA: Diagnosis present

## 2023-07-17 DIAGNOSIS — Z9981 Dependence on supplemental oxygen: Secondary | ICD-10-CM

## 2023-07-17 DIAGNOSIS — J470 Bronchiectasis with acute lower respiratory infection: Secondary | ICD-10-CM | POA: Diagnosis present

## 2023-07-17 DIAGNOSIS — C3492 Malignant neoplasm of unspecified part of left bronchus or lung: Secondary | ICD-10-CM | POA: Diagnosis present

## 2023-07-17 DIAGNOSIS — Z681 Body mass index (BMI) 19 or less, adult: Secondary | ICD-10-CM

## 2023-07-17 DIAGNOSIS — Z833 Family history of diabetes mellitus: Secondary | ICD-10-CM

## 2023-07-17 DIAGNOSIS — L299 Pruritus, unspecified: Secondary | ICD-10-CM | POA: Diagnosis present

## 2023-07-17 DIAGNOSIS — E785 Hyperlipidemia, unspecified: Secondary | ICD-10-CM | POA: Diagnosis present

## 2023-07-17 DIAGNOSIS — L89151 Pressure ulcer of sacral region, stage 1: Secondary | ICD-10-CM | POA: Diagnosis present

## 2023-07-17 DIAGNOSIS — E222 Syndrome of inappropriate secretion of antidiuretic hormone: Secondary | ICD-10-CM | POA: Diagnosis present

## 2023-07-17 DIAGNOSIS — J9811 Atelectasis: Secondary | ICD-10-CM | POA: Diagnosis present

## 2023-07-17 DIAGNOSIS — Z923 Personal history of irradiation: Secondary | ICD-10-CM

## 2023-07-17 DIAGNOSIS — Z7189 Other specified counseling: Secondary | ICD-10-CM | POA: Diagnosis not present

## 2023-07-17 DIAGNOSIS — D6481 Anemia due to antineoplastic chemotherapy: Secondary | ICD-10-CM | POA: Diagnosis present

## 2023-07-17 DIAGNOSIS — Z66 Do not resuscitate: Secondary | ICD-10-CM | POA: Diagnosis not present

## 2023-07-17 DIAGNOSIS — R0602 Shortness of breath: Principal | ICD-10-CM

## 2023-07-17 DIAGNOSIS — D6959 Other secondary thrombocytopenia: Secondary | ICD-10-CM | POA: Diagnosis present

## 2023-07-17 DIAGNOSIS — Z7901 Long term (current) use of anticoagulants: Secondary | ICD-10-CM

## 2023-07-17 DIAGNOSIS — L8990 Pressure ulcer of unspecified site, unspecified stage: Secondary | ICD-10-CM

## 2023-07-17 DIAGNOSIS — K219 Gastro-esophageal reflux disease without esophagitis: Secondary | ICD-10-CM | POA: Diagnosis present

## 2023-07-17 DIAGNOSIS — J90 Pleural effusion, not elsewhere classified: Secondary | ICD-10-CM | POA: Diagnosis present

## 2023-07-17 DIAGNOSIS — Z825 Family history of asthma and other chronic lower respiratory diseases: Secondary | ICD-10-CM

## 2023-07-17 DIAGNOSIS — T451X5A Adverse effect of antineoplastic and immunosuppressive drugs, initial encounter: Secondary | ICD-10-CM | POA: Diagnosis present

## 2023-07-17 DIAGNOSIS — Z1152 Encounter for screening for COVID-19: Secondary | ICD-10-CM

## 2023-07-17 DIAGNOSIS — E876 Hypokalemia: Secondary | ICD-10-CM | POA: Diagnosis present

## 2023-07-17 DIAGNOSIS — I2699 Other pulmonary embolism without acute cor pulmonale: Secondary | ICD-10-CM | POA: Diagnosis present

## 2023-07-17 LAB — CBC WITH DIFFERENTIAL/PLATELET
Abs Immature Granulocytes: 0.4 10*3/uL — ABNORMAL HIGH (ref 0.00–0.07)
Basophils Absolute: 0 10*3/uL (ref 0.0–0.1)
Basophils Relative: 0 %
Eosinophils Absolute: 0 10*3/uL (ref 0.0–0.5)
Eosinophils Relative: 0 %
HCT: 32.4 % — ABNORMAL LOW (ref 39.0–52.0)
Hemoglobin: 11 g/dL — ABNORMAL LOW (ref 13.0–17.0)
Lymphocytes Relative: 13 %
Lymphs Abs: 1.2 10*3/uL (ref 0.7–4.0)
MCH: 29.6 pg (ref 26.0–34.0)
MCHC: 34 g/dL (ref 30.0–36.0)
MCV: 87.3 fL (ref 80.0–100.0)
Metamyelocytes Relative: 4 %
Monocytes Absolute: 0.6 10*3/uL (ref 0.1–1.0)
Monocytes Relative: 7 %
Neutro Abs: 6.8 10*3/uL (ref 1.7–7.7)
Neutrophils Relative %: 76 %
Platelets: 134 10*3/uL — ABNORMAL LOW (ref 150–400)
RBC: 3.71 MIL/uL — ABNORMAL LOW (ref 4.22–5.81)
RDW: 17.2 % — ABNORMAL HIGH (ref 11.5–15.5)
WBC: 8.9 10*3/uL (ref 4.0–10.5)
nRBC: 0 % (ref 0.0–0.2)

## 2023-07-17 LAB — BASIC METABOLIC PANEL
Anion gap: 12 (ref 5–15)
BUN: 14 mg/dL (ref 8–23)
CO2: 23 mmol/L (ref 22–32)
Calcium: 7.8 mg/dL — ABNORMAL LOW (ref 8.9–10.3)
Chloride: 93 mmol/L — ABNORMAL LOW (ref 98–111)
Creatinine, Ser: 0.99 mg/dL (ref 0.61–1.24)
GFR, Estimated: >60 mL/min (ref >60)
Glucose, Bld: 97 mg/dL (ref 70–99)
Potassium: 3.3 mmol/L — ABNORMAL LOW (ref 3.5–5.1)
Sodium: 128 mmol/L — ABNORMAL LOW (ref 135–145)

## 2023-07-17 LAB — PROCALCITONIN: Procalcitonin: 0.89 ng/mL

## 2023-07-17 LAB — RESP PANEL BY RT-PCR (RSV, FLU A&B, COVID)  RVPGX2
Influenza A by PCR: NEGATIVE
Influenza B by PCR: NEGATIVE
Resp Syncytial Virus by PCR: NEGATIVE
SARS Coronavirus 2 by RT PCR: NEGATIVE

## 2023-07-17 LAB — LACTIC ACID, PLASMA: Lactic Acid, Venous: 1.7 mmol/L (ref 0.5–1.9)

## 2023-07-17 MED ORDER — PANTOPRAZOLE SODIUM 40 MG PO TBEC
40.0000 mg | DELAYED_RELEASE_TABLET | Freq: Two times a day (BID) | ORAL | Status: DC
  Administered 2023-07-18 – 2023-07-25 (×13): 40 mg via ORAL
  Filled 2023-07-17 (×16): qty 1

## 2023-07-17 MED ORDER — OXYCODONE HCL 5 MG PO TABS
10.0000 mg | ORAL_TABLET | Freq: Once | ORAL | Status: AC
  Administered 2023-07-17: 10 mg via ORAL
  Filled 2023-07-17: qty 2

## 2023-07-17 MED ORDER — FLUTICASONE PROPIONATE 50 MCG/ACT NA SUSP
2.0000 | Freq: Every day | NASAL | Status: DC
  Administered 2023-07-18 – 2023-07-26 (×6): 2 via NASAL
  Filled 2023-07-17: qty 16

## 2023-07-17 MED ORDER — ARFORMOTEROL TARTRATE 15 MCG/2ML IN NEBU
15.0000 ug | INHALATION_SOLUTION | Freq: Two times a day (BID) | RESPIRATORY_TRACT | Status: DC
  Administered 2023-07-18 – 2023-07-27 (×20): 15 ug via RESPIRATORY_TRACT
  Filled 2023-07-17 (×20): qty 2

## 2023-07-17 MED ORDER — ACETAMINOPHEN 650 MG RE SUPP
650.0000 mg | Freq: Four times a day (QID) | RECTAL | Status: DC | PRN
  Administered 2023-07-20: 650 mg via RECTAL
  Filled 2023-07-17: qty 1

## 2023-07-17 MED ORDER — ALBUTEROL SULFATE (2.5 MG/3ML) 0.083% IN NEBU
3.0000 mL | INHALATION_SOLUTION | RESPIRATORY_TRACT | Status: DC | PRN
  Administered 2023-07-19 – 2023-07-24 (×7): 3 mL via RESPIRATORY_TRACT
  Filled 2023-07-17 (×6): qty 3
  Filled 2023-07-17: qty 6

## 2023-07-17 MED ORDER — SODIUM CHLORIDE 0.9 % IV SOLN
2.0000 g | Freq: Once | INTRAVENOUS | Status: AC
  Administered 2023-07-17: 2 g via INTRAVENOUS
  Filled 2023-07-17: qty 12.5

## 2023-07-17 MED ORDER — SODIUM CHLORIDE 0.9 % IV SOLN
2.0000 g | Freq: Three times a day (TID) | INTRAVENOUS | Status: DC
  Administered 2023-07-18 – 2023-07-25 (×23): 2 g via INTRAVENOUS
  Filled 2023-07-17 (×25): qty 12.5

## 2023-07-17 MED ORDER — INFLUENZA VIRUS VACC SPLIT PF (FLUZONE) 0.5 ML IM SUSY
0.5000 mL | PREFILLED_SYRINGE | INTRAMUSCULAR | Status: AC
  Administered 2023-07-18: 0.5 mL via INTRAMUSCULAR
  Filled 2023-07-17: qty 0.5

## 2023-07-17 MED ORDER — POTASSIUM CHLORIDE CRYS ER 20 MEQ PO TBCR
40.0000 meq | EXTENDED_RELEASE_TABLET | ORAL | Status: AC
  Administered 2023-07-17 (×2): 40 meq via ORAL
  Filled 2023-07-17 (×2): qty 2

## 2023-07-17 MED ORDER — GUAIFENESIN-DM 100-10 MG/5ML PO SYRP
15.0000 mL | ORAL_SOLUTION | Freq: Three times a day (TID) | ORAL | Status: AC
  Administered 2023-07-18 (×2): 15 mL via ORAL
  Filled 2023-07-17 (×2): qty 15

## 2023-07-17 MED ORDER — SODIUM CHLORIDE 0.9 % IV BOLUS
1000.0000 mL | Freq: Once | INTRAVENOUS | Status: AC
  Administered 2023-07-17: 1000 mL via INTRAVENOUS

## 2023-07-17 MED ORDER — OXYCODONE HCL 5 MG PO TABS
10.0000 mg | ORAL_TABLET | Freq: Four times a day (QID) | ORAL | Status: DC | PRN
  Administered 2023-07-17 – 2023-07-24 (×18): 10 mg via ORAL
  Filled 2023-07-17 (×20): qty 2

## 2023-07-17 MED ORDER — LORAZEPAM 0.5 MG PO TABS
0.5000 mg | ORAL_TABLET | Freq: Three times a day (TID) | ORAL | Status: AC | PRN
  Administered 2023-07-18 – 2023-07-20 (×2): 0.5 mg via ORAL
  Filled 2023-07-17 (×2): qty 1

## 2023-07-17 MED ORDER — ONDANSETRON HCL 4 MG/2ML IJ SOLN
4.0000 mg | Freq: Four times a day (QID) | INTRAMUSCULAR | Status: DC | PRN
  Administered 2023-07-18 – 2023-07-25 (×3): 4 mg via INTRAVENOUS
  Filled 2023-07-17 (×3): qty 2

## 2023-07-17 MED ORDER — VANCOMYCIN HCL 1250 MG/250ML IV SOLN
1250.0000 mg | Freq: Once | INTRAVENOUS | Status: AC
  Administered 2023-07-17: 1250 mg via INTRAVENOUS
  Filled 2023-07-17: qty 250

## 2023-07-17 MED ORDER — ENSURE ENLIVE PO LIQD
237.0000 mL | Freq: Three times a day (TID) | ORAL | Status: DC
  Administered 2023-07-18: 237 mL via ORAL

## 2023-07-17 MED ORDER — UMECLIDINIUM BROMIDE 62.5 MCG/ACT IN AEPB
1.0000 | INHALATION_SPRAY | Freq: Every day | RESPIRATORY_TRACT | Status: DC
  Administered 2023-07-18 – 2023-07-27 (×10): 1 via RESPIRATORY_TRACT
  Filled 2023-07-17 (×2): qty 7

## 2023-07-17 MED ORDER — ACETAMINOPHEN 325 MG PO TABS: 650.0000 mg | ORAL_TABLET | Freq: Once | ORAL | Status: DC

## 2023-07-17 MED ORDER — VANCOMYCIN HCL 1250 MG/250ML IV SOLN
1250.0000 mg | INTRAVENOUS | Status: DC
  Administered 2023-07-18 – 2023-07-21 (×4): 1250 mg via INTRAVENOUS
  Filled 2023-07-17 (×6): qty 250

## 2023-07-17 MED ORDER — LORAZEPAM 0.5 MG PO TABS
0.5000 mg | ORAL_TABLET | Freq: Once | ORAL | Status: AC
  Administered 2023-07-17: 0.5 mg via ORAL
  Filled 2023-07-17: qty 1

## 2023-07-17 MED ORDER — POLYETHYLENE GLYCOL 3350 17 G PO PACK: 17.0000 g | PACK | Freq: Every day | ORAL | Status: DC | PRN

## 2023-07-17 MED ORDER — METRONIDAZOLE 500 MG/100ML IV SOLN
500.0000 mg | Freq: Two times a day (BID) | INTRAVENOUS | Status: DC
  Administered 2023-07-17 – 2023-07-22 (×11): 500 mg via INTRAVENOUS
  Filled 2023-07-17 (×11): qty 100

## 2023-07-17 MED ORDER — ACETAMINOPHEN 325 MG PO TABS
650.0000 mg | ORAL_TABLET | Freq: Four times a day (QID) | ORAL | Status: DC | PRN
  Administered 2023-07-18 – 2023-07-21 (×2): 650 mg via ORAL
  Filled 2023-07-17 (×3): qty 2

## 2023-07-17 MED ORDER — TAMSULOSIN HCL 0.4 MG PO CAPS: 0.4000 mg | ORAL_CAPSULE | Freq: Every evening | ORAL | Status: DC | PRN

## 2023-07-17 MED ORDER — ONDANSETRON HCL 4 MG PO TABS
4.0000 mg | ORAL_TABLET | Freq: Four times a day (QID) | ORAL | Status: DC | PRN
  Administered 2023-07-25: 4 mg via ORAL
  Filled 2023-07-17: qty 1

## 2023-07-17 NOTE — Assessment & Plan Note (Addendum)
Follows with Dr. Arbutus Ped, stage IV lung cancer with brain mass.  S/p craniotomy and surgical resection on April 07, 2023 under the care of [name]. Radiation to the brain cavity by Dr. Basilio Cairo.   Patient has been getting palliative chemotherapy. Carboplatin, Alilmta and Keytruda. (Dose reduced due to intolerance).  Cancer treatment now on hold, considering respiratory failure, his prognosis is very poor.  He has decided to transition to home hospice.   Continue pain control with oxycodone, will increase frequency to q 4 hrs as needed and continue with hydromorphone 2 mg as needed q 2 hrs, will check his requirements prior to using long acting  opiate analgesics.   Anemia due to malignancy. Today hgb is 9,5, no indication for PRBC transfusion.

## 2023-07-17 NOTE — Progress Notes (Signed)
Pharmacy Antibiotic Note  Matthew Rivera is a 62 y.o. male admitted on 07/17/2023 with pneumonia. PMH significant for NSCLC with brain metastasis s/p craniotomy and radiation. Patient presented with increased shortness of breath and was recently discharged from a hospitalization for pneumonia on 07/11/23. In ED, patient is afebrile and tachycardic with no leukocytosis. Pharmacy has been consulted for vancomycin and cefepime dosing.  Plan: Start cefepime 2 g IV every 8 hours based on current renal function Vancomycin load of 1250 mg IV x1 given in ED Start vancomycin 1250 mg IV every 24 hours (eAUC 459.8, Scr 0.99, TBW used, Vd 0.72 L/kg) Metronidazole per MD Monitor renal function, clinical status, culture data, and LOT F/u MRSA PCR and de-escalate antibiotics as able  Height: 5\' 11"  (180.3 cm) Weight: 62.1 kg (137 lb) IBW/kg (Calculated) : 75.3  Temp (24hrs), Avg:98.7 F (37.1 C), Min:98.1 F (36.7 C), Max:99.2 F (37.3 C)  Recent Labs  Lab 07/11/23 1315 07/11/23 1409 07/12/23 0314 07/13/23 0452 07/14/23 0304 07/14/23 1020 07/17/23 1419  WBC 1.0*  --  1.1* 1.8* 3.9*  --  8.9  CREATININE 0.69  --  0.86 0.70  --  0.76 0.99  LATICACIDVEN  --  0.7  --   --   --   --  1.7    Estimated Creatinine Clearance: 68 mL/min (by C-G formula based on SCr of 0.99 mg/dL).    No Known Allergies  Antimicrobials this admission: vancomycin 10/06 >>  cefepime 10/06 >>   Dose adjustments this admission: N/A  Microbiology results: 10/06 BCx: pending 10/06 MRSA PCR: ordered  Thank you for involving pharmacy in this patient's care.   Rockwell Alexandria, PharmD Clinical Pharmacist 07/17/2023 9:31 PM

## 2023-07-17 NOTE — H&P (Signed)
History and Physical    Matthew Rivera:[id number] DOB: [birth year] DOA: 07/17/2023  PCP: [name], MD   Patient coming from: Home  I have personally briefly reviewed patient's old medical records in [facility]  Chief Complaint: Difficulty breathing  HPI: Matthew Rivera is a 62 y.o. male with medical history significant for lung cancer with brain mets, pulmonary embolism on anticoagulation. Patient has had 2 recent hospitalizations 8/21 to 8/24 for multifocal pneumonia with cavitary lesions and acute PE, and recently hospitalized 9/30 to 10/3 for sepsis secondary to HCAP.  He was treated with IV vancomycin and cefepime, and switched to oral antibiotics Augmentin on discharge.  Reports compliance with Augmentin and was to take his last dose tomorrow morning.  Over the past few days he reports progressive difficulty breathing, and persistent cough.  EMS reports O2 sats down to 79% on 4L.  Patient is on 3 L chronically, spouse increased his O2 to 4 L but this did not help with his breathing.  No chest pain.  Unchanged bilateral lower extremity swelling.  He reports compliance with his Eliquis. Also reports a generalized rash, patient mildly itchy, worse on his trunk and present on his extremities but to a lesser extent over the past week, prior to recent hospitalization.    ED Course: Tmax 99.2.  Heart rate 105-118.  Respiratory rate 10-32.  Blood pressure systolic 140 110.  O2 sats per ED provider down to 88% on 4 L, patient on 6 L. COVID test negative.  Lactic acid 1.7. Chest x-ray- Minimally improved right base aeration. Otherwise, similar appearance of bilateral lower lung predominant interstitial and airspace disease. In this patient with a history of lung cancer, this could represent pneumonia, lymphangitic tumor spread and/or treatment effects. Infectious process favored on 06/01/2023 CT. 1 L bolus even.  Hospitalist to admit for acute on chronic hypoxic respiratory  failure.  Review of Systems: As per HPI all other systems reviewed and negative.  Past Medical History:  Diagnosis Date   Dyslipidemia    GERD (gastroesophageal reflux disease)    Lung nodule seen on imaging study 03/31/2023   seen on CT results   Osteoarthritis    Smoker    long term smoker 1 ppd    Past Surgical History:  Procedure Laterality Date   BACK SURGERY     x 1   COLONOSCOPY WITH PROPOFOL N/A 08/18/2020   Procedure: COLONOSCOPY WITH PROPOFOL;  Surgeon: Lanelle Bal, DO;  Location: [facility];  Service: Endoscopy;  Laterality: N/A;  7:30am   CRANIOTOMY Right 04/07/2023   Procedure: Right suboccipital craniotomy for tumor resection;  Surgeon: Tressie Stalker, MD;  Location: [facility] OR;  Service: Neurosurgery;  Laterality: Right;  TO follow   IR IMAGING GUIDED PORT INSERTION  05/12/2023   MULTIPLE TOOTH EXTRACTIONS     wears full dentures   POLYPECTOMY  08/18/2020   Procedure: POLYPECTOMY INTESTINAL;  Surgeon: Lanelle Bal, DO;  Location: [facility];  Service: Endoscopy;;     reports that he has been smoking cigarettes. He started smoking about 52 years ago. He has a 78.4 pack-year smoking history. He has never used smokeless tobacco. He reports that he does not currently use alcohol. He reports that he does not use drugs.  No Known Allergies  Family History  Problem Relation Age of Onset   Diabetes Father    Asthma Father    Prior to Admission medications   Medication Sig Start Date End Date  Taking? Authorizing Provider  albuterol (VENTOLIN HFA) 108 (90 Base) MCG/ACT inhaler Inhale 2 puffs into the lungs every 4 (four) hours as needed for wheezing or shortness of breath.   Yes [provider]  amoxicillin-clavulanate (AUGMENTIN) 875-125 MG tablet Take 1 tablet by mouth every 12 (twelve) hours for 4 days. 07/14/23 07/18/23 Yes Sheikh, Omair Latif, DO  apixaban (ELIQUIS) 5 MG TABS tablet Take 1 tablet (5 mg total) by mouth 2 (two) times daily. This  prescription will not start until 9/20 so can place on hold Patient taking differently: Take 5 mg by mouth 2 (two) times daily. 07/01/23  Yes Dahal, Melina Schools, MD  feeding supplement (ENSURE ENLIVE / ENSURE PLUS) LIQD Take 237 mLs by mouth 3 (three) times daily between meals. 07/14/23  Yes Sheikh, Omair Latif, DO  ferrous sulfate 325 (65 FE) MG tablet Take 325 mg by mouth daily with breakfast.   Yes [provider]  fluticasone (FLONASE) 50 MCG/ACT nasal spray Place 2 sprays into both nostrils daily. 07/15/23  Yes Sheikh, Omair Latif, DO  folic acid (FOLVITE) 1 MG tablet Take 1 tablet (1 mg total) by mouth daily. 05/04/23  Yes Heilingoetter, Cassandra L, PA-C  lidocaine-prilocaine (EMLA) cream Apply 1 Application topically as needed. Patient taking differently: Apply 1 Application topically as needed (for port access- every 21 days). 05/04/23  Yes Heilingoetter, Cassandra L, PA-C  loratadine (CLARITIN) 10 MG tablet Take 1 tablet (10 mg total) by mouth daily. 07/15/23  Yes Sheikh, Omair Latif, DO  Multiple Vitamin (MULTIVITAMIN WITH MINERALS) TABS tablet Take 1 tablet by mouth daily. 07/14/23  Yes Sheikh, Omair Latif, DO  ondansetron (ZOFRAN) 4 MG tablet Take 1 tablet (4 mg total) by mouth every 8 (eight) hours as needed for nausea or vomiting. 07/14/23 07/13/24 Yes Sheikh, Omair Latif, DO  Oxycodone HCl 10 MG TABS Take 10 mg by mouth every 4 (four) hours as needed (for pain).   Yes [provider]  pantoprazole (PROTONIX) 40 MG tablet Take 1 tablet (40 mg total) by mouth 2 (two) times daily. Patient taking differently: Take 40 mg by mouth 2 (two) times daily before a meal. 04/01/23  Yes Rhetta Mura, MD  prochlorperazine (COMPAZINE) 10 MG tablet Take 1 tablet (10 mg total) by mouth every 6 (six) hours as needed for nausea or vomiting. 06/15/23  Yes Heilingoetter, Cassandra L, PA-C  tamsulosin (FLOMAX) 0.4 MG CAPS capsule Take 0.4 mg by mouth at bedtime as needed (urinary flow issues).  03/01/23  Yes [provider]  Tiotropium Bromide-Olodaterol (STIOLTO RESPIMAT) 2.5-2.5 MCG/ACT AERS Inhale 2 puffs into the lungs daily. 05/10/23  Yes Charlott Holler, MD  traMADol (ULTRAM) 50 MG tablet Take 50-100 mg by mouth 2 (two) times daily as needed (for pain). 06/14/23  Yes [provider]  TYLENOL 500 MG tablet Take 500-1,000 mg by mouth every 6 (six) hours as needed for mild pain or headache.   Yes [provider]    Physical Exam: Vitals:   07/17/23 1412 07/17/23 1500 07/17/23 1600 07/17/23 1626  BP:  (!) 110/98 117/70 109/68  Pulse:  (!) 112 (!) 105 (!) 111  Resp:  (!) 27 19 14   Temp:      TempSrc:      SpO2:  95% 100% 99%  Weight: 62.1 kg     Height: 5\' 11"  (1.803 m)       Constitutional: NAD, calm, comfortable Vitals:   07/17/23 1412 07/17/23 1500 07/17/23 1600 07/17/23 1626  BP:  Marland Kitchen)  110/98 117/70 109/68  Pulse:  (!) 112 (!) 105 (!) 111  Resp:  (!) 27 19 14   Temp:      TempSrc:      SpO2:  95% 100% 99%  Weight: 62.1 kg     Height: 5\' 11"  (1.803 m)      Eyes: PERRL, lids and conjunctivae normal ENMT: Mucous membranes are moist.   Neck: normal, supple, no masses, no thyromegaly Respiratory: crackles left lung, no wheezing. Normal respiratory effort. No accessory muscle use.  Cardiovascular: Tachycardic, Regular rate and rhythm, no murmurs / rubs / gallops. Trace pitting bilat lower extremity edema to lower 1/3 of leg.  Abdomen: no tenderness, no masses palpated. No hepatosplenomegaly. Bowel sounds positive.  Musculoskeletal: no clubbing / cyanosis. No joint deformity upper and lower extremities. Good ROM, no contractures. Normal muscle tone.  Skin: Diffuse macular type rash worse on trunk anteriorly and posteriorly, dry skin , no ulcers. No induration Neurologic: No facial asymmetry, moving extremities spontaneously, speech fluent without aphasia.  Psychiatric: Normal judgment and insight. Alert and oriented x 3. Normal mood.   Labs on  Admission: I have personally reviewed following labs and imaging studies  CBC: Recent Labs  Lab 07/11/23 1315 07/12/23 0314 07/12/23 0856 07/12/23 2255 07/13/23 0452 07/14/23 0304 07/17/23 1419  WBC 1.0* 1.1*  --   --  1.8* 3.9* 8.9  NEUTROABS 0.5*  --   --   --  0.9* 3.1 6.8  HGB 7.7* 7.0* 6.6* 8.7* 8.8* 9.1* 11.0*  HCT 23.6* 21.2* 20.2* 26.0* 26.0* 27.3* 32.4*  MCV 93.7 93.8  --   --  87.8 89.2 87.3  PLT 167 165  --   --  129* 124* 134*   Basic Metabolic Panel: Recent Labs  Lab 07/11/23 1315 07/12/23 0314 07/12/23 0856 07/13/23 0452 07/14/23 1020 07/17/23 1419  NA 132* 128*  --  129* 132* 128*  K 3.7 3.4*  --  3.5 3.2* 3.3*  CL 96* 96*  --  98 100 93*  CO2 27 24  --  23 21* 23  GLUCOSE 96 97  --  89 96 97  BUN 13 12  --  10 10 14   CREATININE 0.69 0.86  --  0.70 0.76 0.99  CALCIUM 8.0* 7.7*  --  7.5* 7.7* 7.8*  MG 1.2*  --  1.4* 1.7 1.4*  --   PHOS  --   --   --   --  2.2*  --    GFR: Estimated Creatinine Clearance: 68 mL/min (by C-G formula based on SCr of 0.99 mg/dL). Liver Function Tests: Recent Labs  Lab 07/11/23 1315 07/14/23 1020  AST 29 18  ALT 36 21  ALKPHOS 157* 124  BILITOT 1.1 0.8  PROT 6.2* 5.2*  ALBUMIN 2.2* 1.8*   Urine analysis:    Component Value Date/Time   COLORURINE YELLOW 07/12/2023 1001   APPEARANCEUR CLEAR 07/12/2023 1001   LABSPEC 1.015 07/12/2023 1001   PHURINE 5.0 07/12/2023 1001   GLUCOSEU NEGATIVE 07/12/2023 1001   HGBUR NEGATIVE 07/12/2023 1001   BILIRUBINUR NEGATIVE 07/12/2023 1001   KETONESUR NEGATIVE 07/12/2023 1001   PROTEINUR NEGATIVE 07/12/2023 1001   UROBILINOGEN 0.2 01/13/2011 1935   NITRITE NEGATIVE 07/12/2023 1001   LEUKOCYTESUR NEGATIVE 07/12/2023 1001    Radiological Exams on Admission: DG Chest Port 1 View  Result Date: 07/17/2023 CLINICAL DATA:  Shortness of breath. History of stage IV lung cancer and recent pneumonia. EXAM: PORTABLE CHEST 1 VIEW COMPARISON:  07/11/2023 FINDINGS: Right  Port-A-Cath  unchanged. Midline trachea. Normal heart size. Small volume right-sided pleural fluid and/or thickening especially laterally, similar. No left-sided pleural fluid. No pneumothorax. Minimally improved right mid and lower lung interstitial and airspace disease. Left mid and lower lung interstitial and airspace disease is similar. IMPRESSION: Minimally improved right base aeration. Otherwise, similar appearance of bilateral lower lung predominant interstitial and airspace disease. In this patient with a history of lung cancer, this could represent pneumonia, lymphangitic tumor spread and/or treatment effects. Infectious process favored on 06/01/2023 CT. Electronically Signed   By: Jeronimo Greaves M.D.   On: 07/17/2023 16:10    EKG: Independently reviewed. Sinus rate 121, No significant change from prior.  Assessment/Plan Principal Problem:   Acute on chronic respiratory failure with hypoxia (HCC) Active Problems:   HCAP (healthcare-associated pneumonia)   Brain mass   Adenosquamous carcinoma of left lung (HCC)   Pulmonary embolism (HCC)   Hypokalemia   Port-A-Cath in place   Rash   Assessment and Plan: * Acute on chronic respiratory failure with hypoxia (HCC) O2 sats here down to 88% on 4 L now on 6 L.  Sats greater than 92%.  On 3 L at baseline. -Follow-up CT chest findings- (See detailed report) Constellation of findings favored to represent worsening multifocal pneumonia superimposed on a background of interstitial lung disease, few new or increased cavitary lesions for example in the right middle lobe. New moderate right pleural effusion and small left pleural effusion. -Will start IV vancomycin and cefepime I will add metronidazole considering cavitary lesions -Right thoracentesis in a.m. with fluid analysis - Hold Eliquis for now pending thoracentesis  HCAP (healthcare-associated pneumonia) 2 recent hospitalizations initially for multifocal pneumonia treated with ceftriazone and azitho, and  subsequently admitted for HCAP- Treated with IV cefepime and vancomycin, discharged 10/3 to complete 4 more days of Augmentin which he was compliant with.  No presenting with worsening hypoxia. Chest x-ray-minimally improved right base aeration, similar appearance of bilateral lower lung predominant interstitial and airspace disease-would represent pneumonia, lymphangitic tumors spread Or treatment effect. -Not quite meeting sepsis criteria, he is tachycardic, white count 8.9, increased from recent neutropenic baseline- 1-3.9.  Lactic acid 1.7. -COVID test negative - Procalcitonin elevated, but in the setting of lung cancer,? significance - Continue IV antibiotics- IV vancomycin and cefepime -Obtain CT chest Wo contrast -Follow-up blood cultures - Mucolytics  Hypokalemia K- 3.3. Replete.  Pulmonary embolism (HCC) Reports compliance with Eliquis. Held for now for thora.  Adenosquamous carcinoma of left lung (HCC) Follows with Dr. Arbutus Ped, stage IV lung cancer with brain mass.  S/p craniotomy and surgical resection on April 07, 2023 under the care of [name]. Radiation to the brain cavity by Dr. Basilio Cairo.  Currently on chemotherapy.    Rash Diffuse rash may be related to Chemo. But rash started before recent hospitalization 9/30 so doubt drug reaction, red man syndrome.    DVT prophylaxis: SCDS- pending thoracentesis in a.m.  Code Status: FULL Code- Confirmed with patient and spouse at bedside.  ACP documents reviewed Family Communication: Spouse at bedside Disposition Plan: > 2 days Consults called: None Admission status: Inpt tele  I certify that at the point of admission it is my clinical judgment that the patient will require inpatient hospital care spanning beyond 2 midnights from the point of admission due to high intensity of service, high risk for further deterioration and high frequency of surveillance required.    Author: Onnie Boer, MD 07/17/2023 9:33 PM  For on  call  review [email or web address].

## 2023-07-17 NOTE — ED Provider Notes (Signed)
[place] [facility] AT [facility] Provider Note   CSN: [id number] Arrival date & time: 07/17/23  1352     History  Chief Complaint  Patient presents with   Shortness of Breath    Matthew Rivera is a 62 y.o. male with past medical history significant for tobacco abuse, hyperlipidemia, acid reflux, non-small cell lung cancer of the left lung with brain metastasis, status post craniotomy, radiation of the brain who presents concern for shortness of breath for the last day with some chest tightness.  Patient was recently admitted for hospital-acquired pneumonia, recently discharged on 9/30.  He was treated with bank and cefepime while admitted, discharged on oral Augmentin that he is still been taking.  Additionally previous history of PE, he is still taking his Eliquis.  He reports his last chemotherapy was Monday.   Shortness of Breath      Home Medications Prior to Admission medications   Medication Sig Start Date End Date Taking? Authorizing Provider  albuterol (VENTOLIN HFA) 108 (90 Base) MCG/ACT inhaler Inhale 2 puffs into the lungs every 4 (four) hours as needed for wheezing or shortness of breath.   Yes [provider]  amoxicillin-clavulanate (AUGMENTIN) 875-125 MG tablet Take 1 tablet by mouth every 12 (twelve) hours for 4 days. 07/14/23 07/18/23 Yes Sheikh, Omair Latif, DO  apixaban (ELIQUIS) 5 MG TABS tablet Take 1 tablet (5 mg total) by mouth 2 (two) times daily. This prescription will not start until 9/20 so can place on hold Patient taking differently: Take 5 mg by mouth 2 (two) times daily. 07/01/23  Yes Dahal, Melina Schools, MD  feeding supplement (ENSURE ENLIVE / ENSURE PLUS) LIQD Take 237 mLs by mouth 3 (three) times daily between meals. 07/14/23  Yes Sheikh, Omair Latif, DO  ferrous sulfate 325 (65 FE) MG tablet Take 325 mg by mouth daily with breakfast.   Yes [provider]  fluticasone (FLONASE) 50 MCG/ACT nasal spray Place 2 sprays into  both nostrils daily. 07/15/23  Yes Sheikh, Omair Latif, DO  folic acid (FOLVITE) 1 MG tablet Take 1 tablet (1 mg total) by mouth daily. 05/04/23  Yes Heilingoetter, Cassandra L, PA-C  lidocaine-prilocaine (EMLA) cream Apply 1 Application topically as needed. Patient taking differently: Apply 1 Application topically as needed (for port access- every 21 days). 05/04/23  Yes Heilingoetter, Cassandra L, PA-C  loratadine (CLARITIN) 10 MG tablet Take 1 tablet (10 mg total) by mouth daily. 07/15/23  Yes Sheikh, Omair Latif, DO  Multiple Vitamin (MULTIVITAMIN WITH MINERALS) TABS tablet Take 1 tablet by mouth daily. 07/14/23  Yes Sheikh, Omair Latif, DO  ondansetron (ZOFRAN) 4 MG tablet Take 1 tablet (4 mg total) by mouth every 8 (eight) hours as needed for nausea or vomiting. 07/14/23 07/13/24 Yes Sheikh, Omair Latif, DO  Oxycodone HCl 10 MG TABS Take 10 mg by mouth every 4 (four) hours as needed (for pain).   Yes [provider]  pantoprazole (PROTONIX) 40 MG tablet Take 1 tablet (40 mg total) by mouth 2 (two) times daily. Patient taking differently: Take 40 mg by mouth 2 (two) times daily before a meal. 04/01/23  Yes Rhetta Mura, MD  prochlorperazine (COMPAZINE) 10 MG tablet Take 1 tablet (10 mg total) by mouth every 6 (six) hours as needed for nausea or vomiting. 06/15/23  Yes Heilingoetter, Cassandra L, PA-C  tamsulosin (FLOMAX) 0.4 MG CAPS capsule Take 0.4 mg by mouth at bedtime as needed (urinary flow issues). 03/01/23  Yes [provider]  Tiotropium Bromide-Olodaterol (STIOLTO RESPIMAT) 2.5-2.5 MCG/ACT AERS Inhale 2 puffs into the lungs daily. 05/10/23  Yes Charlott Holler, MD  traMADol (ULTRAM) 50 MG tablet Take 50-100 mg by mouth 2 (two) times daily as needed (for pain). 06/14/23  Yes [provider]  TYLENOL 500 MG tablet Take 500-1,000 mg by mouth every 6 (six) hours as needed for mild pain or headache.   Yes [provider]      Allergies    Patient has no known  allergies.    Review of Systems   Review of Systems  Respiratory:  Positive for shortness of breath.   All other systems reviewed and are negative.   Physical Exam Updated Vital Signs BP 109/68   Pulse (!) 111   Temp 99.2 F (37.3 C) (Axillary)   Resp 14   Ht 5\' 11"  (1.803 m)   Wt 62.1 kg   SpO2 99%   BMI 19.11 kg/m  Physical Exam Vitals and nursing note reviewed.  Constitutional:      General: He is not in acute distress.    Appearance: Normal appearance. He is ill-appearing.  HENT:     Head: Normocephalic and atraumatic.  Eyes:     General:        Right eye: No discharge.        Left eye: No discharge.  Cardiovascular:     Rate and Rhythm: Regular rhythm. Tachycardia present.     Heart sounds: No murmur heard.    No friction rub. No gallop.  Pulmonary:     Effort: Pulmonary effort is normal.     Breath sounds: Normal breath sounds.     Comments: Increased respiratory effort, but no significant acute respiratory distress on nasal cannula Abdominal:     General: Bowel sounds are normal.     Palpations: Abdomen is soft.  Skin:    General: Skin is warm and dry.     Capillary Refill: Capillary refill takes less than 2 seconds.  Neurological:     Mental Status: He is alert and oriented to person, place, and time.  Psychiatric:        Mood and Affect: Mood normal.        Behavior: Behavior normal.     ED Results / Procedures / Treatments   Labs (all labs ordered are listed, but only abnormal results are displayed) Labs Reviewed  BASIC METABOLIC PANEL - Abnormal; Notable for the following components:      Result Value   Sodium 128 (*)    Potassium 3.3 (*)    Chloride 93 (*)    Calcium 7.8 (*)    All other components within normal limits  CBC WITH DIFFERENTIAL/PLATELET - Abnormal; Notable for the following components:   RBC 3.71 (*)    Hemoglobin 11.0 (*)    HCT 32.4 (*)    RDW 17.2 (*)    Platelets 134 (*)    Abs Immature Granulocytes 0.40 (*)    All  other components within normal limits  RESP PANEL BY RT-PCR (RSV, FLU A&B, COVID)  RVPGX2  CULTURE, BLOOD (ROUTINE X 2)  CULTURE, BLOOD (ROUTINE X 2)  LACTIC ACID, PLASMA    EKG EKG Interpretation Date/Time:  Sunday July 17 2023 14:03:44 EDT Ventricular Rate:  121 PR Interval:  105 QRS Duration:  81 QT Interval:  330 QTC Calculation: 469 R Axis:   39  Text Interpretation: Sinus tachycardia Multiple premature complexes, vent & supraven Probable left atrial enlargement Low voltage,  extremity and precordial leads Confirmed by Pricilla Loveless [phone]) on 07/17/2023 4:24:38 PM  Radiology DG Chest Port 1 View  Result Date: 07/17/2023 CLINICAL DATA:  Shortness of breath. History of stage IV lung cancer and recent pneumonia. EXAM: PORTABLE CHEST 1 VIEW COMPARISON:  07/11/2023 FINDINGS: Right Port-A-Cath unchanged. Midline trachea. Normal heart size. Small volume right-sided pleural fluid and/or thickening especially laterally, similar. No left-sided pleural fluid. No pneumothorax. Minimally improved right mid and lower lung interstitial and airspace disease. Left mid and lower lung interstitial and airspace disease is similar. IMPRESSION: Minimally improved right base aeration. Otherwise, similar appearance of bilateral lower lung predominant interstitial and airspace disease. In this patient with a history of lung cancer, this could represent pneumonia, lymphangitic tumor spread and/or treatment effects. Infectious process favored on 06/01/2023 CT. Electronically Signed   By: Jeronimo Greaves M.D.   On: 07/17/2023 16:10    Procedures .Critical Care  Performed by: Olene Floss, PA-C Authorized by: Olene Floss, PA-C   Critical care provider statement:    Critical care time (minutes):  30   Critical care was necessary to treat or prevent imminent or life-threatening deterioration of the following conditions:  Respiratory failure   Critical care was time spent personally by me on  the following activities:  Development of treatment plan with patient or surrogate, discussions with consultants, evaluation of patient's response to treatment, examination of patient, ordering and review of laboratory studies, ordering and review of radiographic studies, ordering and performing treatments and interventions, pulse oximetry, re-evaluation of patient's condition and review of old charts   Care discussed with: admitting provider       Medications Ordered in ED Medications  sodium chloride 0.9 % bolus 1,000 mL (1,000 mLs Intravenous Bolus 07/17/23 1422)  oxyCODONE (Oxy IR/ROXICODONE) immediate release tablet 10 mg (10 mg Oral Given 07/17/23 1531)    ED Course/ Medical Decision Making/ A&P                                 Medical Decision Making Amount and/or Complexity of Data Reviewed Labs: ordered. Radiology: ordered.  Risk Prescription drug management. Decision regarding hospitalization.   This patient is a 62 y.o. male  who presents to the ED for concern of shortness of breath, chills.   Differential diagnoses prior to evaluation: The emergent differential diagnosis includes, but is not limited to,  asthma exacerbation, COPD exacerbation, acute upper respiratory infection, acute bronchitis, chronic bronchitis, interstitial lung disease, ARDS, PE, pneumonia, atypical ACS, carbon monoxide poisoning, spontaneous pneumothorax, new CHF vs CHF exacerbation, versus other  . This is not an exhaustive differential.   Past Medical History / Co-morbidities / Social History: tobacco abuse, hyperlipidemia, acid reflux, non-small cell lung cancer of the left lung with brain metastasis, status post craniotomy, radiation of the brain, PE  Additional history: Chart reviewed. Pertinent results include: Extensively reviewed the lab work from patient's recent previous admission  Physical Exam: Physical exam performed. The pertinent findings include:  Increased respiratory effort, but  no significant acute respiratory distress on nasal cannula  Patient has had some desaturation less than 90% even on nasal cannula.  Some increased work of breathing without wheezing, rhonchi.  Tachypnea on arrival.  Requiring around 6 L please greater than his home nasal cannula.   Lab Tests/Imaging studies: I personally interpreted labs/imaging and the pertinent results include: CBC overall stable, no noted leukocytosis, BMP notable for hyponatremia, sodium 128,  potassium 3.3, no anion gap noted.  Normal lactic acid.  Blood cultures pending, RVP negative for COVID, flu, RSV.  Independently turbid plain film chest x-ray shows fairly stable compared to recent admission, most recently with suspected infectious etiology, no significant improvement from that time, nor significant worsening.  Suspicious for ongoing infection versus complication related to his known cancer.  I agree with the radiologist interpretation.  Cardiac monitoring: EKG obtained and interpreted by myself and attending physician which shows: Sinus tachycardia   Medications: I ordered medication including patient's home oxycodone for pain, fluid bolus for tachycardia, consider beginning antibiotics, will defer to admitting team given some confusion on whether this is a primary infectious process versus ongoing worsening of his known lung cancer.  I have reviewed the patients home medicines and have made adjustments as needed.   Disposition: After consideration of the diagnostic results and the patients response to treatment, I feel that patient would benefit from admission for acute hypoxic respiratory failure, persistent tachycardia, tachypnea, increased work of breathing with either ongoing hospital-acquired pneumonia versus worsening of lung cancer.    Final Clinical Impression(s) / ED Diagnoses Final diagnoses:  None    Rx / DC Orders ED Discharge Orders     None         Olene Floss, PA-C 07/17/23  1742    Coral Spikes, DO 07/19/23 [phone]

## 2023-07-17 NOTE — Assessment & Plan Note (Deleted)
Diffuse rash may be related to Chemo. But rash started before recent hospitalization 9/30 so doubt drug reaction, red man syndrome.

## 2023-07-17 NOTE — ED Triage Notes (Signed)
Pt brought in by [facility] for increased SOB the last couple of days. Pt recently admitted to hospital for pneumonia and has stage 4 lung cancer. Pt on chronic o2 4L and 79% at home. EMS placed pt on nonrebreather oxygen saturation increased to 98%.

## 2023-07-17 NOTE — Assessment & Plan Note (Addendum)
Continue anticoagulation with apixaban.  ?

## 2023-07-17 NOTE — Assessment & Plan Note (Deleted)
O2 sats here down to 88% on 4 L now on 6 L.  Sats greater than 92%.  On 3 L at baseline. -Follow-up CT chest findings- (See detailed report) Constellation of findings favored to represent worsening multifocal pneumonia superimposed on a background of interstitial lung disease, few new or increased cavitary lesions for example in the right middle lobe. New moderate right pleural effusion and small left pleural effusion. -Will start IV vancomycin and cefepime I will add metronidazole considering cavitary lesions -Right thoracentesis in a.m. with fluid analysis - Hold Eliquis for now pending thoracentesis

## 2023-07-17 NOTE — Assessment & Plan Note (Deleted)
2 recent hospitalizations initially for multifocal pneumonia treated with ceftriazone and azitho, and subsequently admitted for HCAP- Treated with IV cefepime and vancomycin, discharged 10/3 to complete 4 more days of Augmentin which he was compliant with.  No presenting with worsening hypoxia. Chest x-ray-minimally improved right base aeration, similar appearance of bilateral lower lung predominant interstitial and airspace disease-would represent pneumonia, lymphangitic tumors spread Or treatment effect. -Not quite meeting sepsis criteria, he is tachycardic, white count 8.9, increased from recent neutropenic baseline- 1-3.9.  Lactic acid 1.7. -COVID test negative - Procalcitonin elevated, but in the setting of lung cancer,? significance - Continue IV antibiotics- IV vancomycin and cefepime -Obtain CT chest Wo contrast -Follow-up blood cultures - Mucolytics

## 2023-07-17 NOTE — Assessment & Plan Note (Addendum)
Hyponatremia.   Renal function today with serum cr at 1,0 with K at 3,3 and serum bicarbonate at 22. Na 136  Plan to add 40 meq Kcl x2 and follow up renal functio in am.  Patient with poor oral intake.

## 2023-07-18 ENCOUNTER — Ambulatory Visit: Payer: 59 | Admitting: Physician Assistant

## 2023-07-18 ENCOUNTER — Encounter (HOSPITAL_COMMUNITY): Payer: Self-pay | Admitting: Internal Medicine

## 2023-07-18 ENCOUNTER — Other Ambulatory Visit: Payer: Self-pay

## 2023-07-18 ENCOUNTER — Ambulatory Visit: Payer: 59

## 2023-07-18 ENCOUNTER — Inpatient Hospital Stay (HOSPITAL_COMMUNITY): Payer: 59

## 2023-07-18 ENCOUNTER — Other Ambulatory Visit: Payer: 59

## 2023-07-18 DIAGNOSIS — J9621 Acute and chronic respiratory failure with hypoxia: Secondary | ICD-10-CM | POA: Diagnosis not present

## 2023-07-18 DIAGNOSIS — Z7189 Other specified counseling: Secondary | ICD-10-CM | POA: Diagnosis not present

## 2023-07-18 DIAGNOSIS — Z515 Encounter for palliative care: Secondary | ICD-10-CM | POA: Diagnosis not present

## 2023-07-18 LAB — BLOOD GAS, ARTERIAL
Acid-Base Excess: 2.4 mmol/L — ABNORMAL HIGH (ref 0.0–2.0)
Bicarbonate: 25 mmol/L (ref 20.0–28.0)
Drawn by: 2222
FIO2: 52 %
O2 Saturation: 92.1 %
Patient temperature: 37.1
pCO2 arterial: 32 mm[Hg] (ref 32–48)
pH, Arterial: 7.5 — ABNORMAL HIGH (ref 7.35–7.45)
pO2, Arterial: 58 mm[Hg] — ABNORMAL LOW (ref 83–108)

## 2023-07-18 LAB — CBC
HCT: 28.8 % — ABNORMAL LOW (ref 39.0–52.0)
Hemoglobin: 9.9 g/dL — ABNORMAL LOW (ref 13.0–17.0)
MCH: 29.9 pg (ref 26.0–34.0)
MCHC: 34.4 g/dL (ref 30.0–36.0)
MCV: 87 fL (ref 80.0–100.0)
Platelets: 109 10*3/uL — ABNORMAL LOW (ref 150–400)
RBC: 3.31 MIL/uL — ABNORMAL LOW (ref 4.22–5.81)
RDW: 17.3 % — ABNORMAL HIGH (ref 11.5–15.5)
WBC: 8.7 10*3/uL (ref 4.0–10.5)
nRBC: 0.3 % — ABNORMAL HIGH (ref 0.0–0.2)

## 2023-07-18 LAB — CULTURE, BLOOD (ROUTINE X 2)
Culture: NO GROWTH
Culture: NO GROWTH
Special Requests: ADEQUATE
Special Requests: ADEQUATE

## 2023-07-18 LAB — BASIC METABOLIC PANEL
Anion gap: 7 (ref 5–15)
BUN: 12 mg/dL (ref 8–23)
CO2: 25 mmol/L (ref 22–32)
Calcium: 7.4 mg/dL — ABNORMAL LOW (ref 8.9–10.3)
Chloride: 95 mmol/L — ABNORMAL LOW (ref 98–111)
Creatinine, Ser: 0.86 mg/dL (ref 0.61–1.24)
GFR, Estimated: >60 mL/min (ref >60)
Glucose, Bld: 108 mg/dL — ABNORMAL HIGH (ref 70–99)
Potassium: 4 mmol/L (ref 3.5–5.1)
Sodium: 127 mmol/L — ABNORMAL LOW (ref 135–145)

## 2023-07-18 LAB — LACTATE DEHYDROGENASE: LDH: 381 U/L — ABNORMAL HIGH (ref 98–192)

## 2023-07-18 LAB — D-DIMER, QUANTITATIVE: D-Dimer, Quant: 2.11 ug{FEU}/mL — ABNORMAL HIGH (ref 0.00–0.50)

## 2023-07-18 LAB — MRSA NEXT GEN BY PCR, NASAL: MRSA by PCR Next Gen: NOT DETECTED

## 2023-07-18 LAB — LACTIC ACID, PLASMA
Lactic Acid, Venous: 1.4 mmol/L (ref 0.5–1.9)
Lactic Acid, Venous: 1.5 mmol/L (ref 0.5–1.9)

## 2023-07-18 LAB — PROCALCITONIN: Procalcitonin: 1.17 ng/mL

## 2023-07-18 MED ORDER — APIXABAN 5 MG PO TABS
5.0000 mg | ORAL_TABLET | Freq: Two times a day (BID) | ORAL | Status: DC
  Administered 2023-07-18 – 2023-07-26 (×16): 5 mg via ORAL
  Filled 2023-07-18 (×19): qty 1

## 2023-07-18 MED ORDER — METHYLPREDNISOLONE SODIUM SUCC 40 MG IJ SOLR
40.0000 mg | Freq: Every day | INTRAMUSCULAR | Status: DC
  Administered 2023-07-18 – 2023-07-27 (×10): 40 mg via INTRAVENOUS
  Filled 2023-07-18 (×11): qty 1

## 2023-07-18 MED ORDER — BOOST / RESOURCE BREEZE PO LIQD CUSTOM
1.0000 | Freq: Three times a day (TID) | ORAL | Status: DC
  Administered 2023-07-18: 1 via ORAL

## 2023-07-18 MED ORDER — LEVALBUTEROL HCL 0.63 MG/3ML IN NEBU
INHALATION_SOLUTION | RESPIRATORY_TRACT | Status: AC
  Filled 2023-07-18: qty 21

## 2023-07-18 MED ORDER — IPRATROPIUM BROMIDE 0.02 % IN SOLN
RESPIRATORY_TRACT | Status: AC
  Filled 2023-07-18: qty 2.5

## 2023-07-18 MED ORDER — TRAZODONE HCL 50 MG PO TABS
100.0000 mg | ORAL_TABLET | Freq: Every evening | ORAL | Status: DC | PRN
  Administered 2023-07-18 – 2023-07-20 (×3): 100 mg via ORAL
  Filled 2023-07-18 (×3): qty 2

## 2023-07-18 MED ORDER — MIRTAZAPINE 15 MG PO TABS
7.5000 mg | ORAL_TABLET | Freq: Every day | ORAL | Status: DC
  Administered 2023-07-18 – 2023-07-26 (×9): 7.5 mg via ORAL
  Filled 2023-07-18 (×9): qty 1

## 2023-07-18 MED ORDER — CHLORHEXIDINE GLUCONATE CLOTH 2 % EX PADS
6.0000 | MEDICATED_PAD | Freq: Every day | CUTANEOUS | Status: DC
  Administered 2023-07-18 – 2023-07-22 (×5): 6 via TOPICAL

## 2023-07-18 NOTE — Progress Notes (Signed)
SLP Cancellation Note  Patient Details Name: Matthew Rivera MRN: [id number] DOB: [birth year]   Cancelled treatment:       Reason Eval/Treat Not Completed: Fatigue/lethargy limiting ability to participate. Upon entering the room SLP found Pt to be "finally resting" per family at bedside. Note congested cough and respirations at baseline. Pt reportedly is eating very little and has no appetite/desire to eat. SLP agreed to allow Pt to rest and will continue efforts. Will re-attempt BSE tomorrow. Thank you,  Matthew Rivera, CCC-SLP Speech Language Pathologist    Matthew Rivera 07/18/2023, 2:03 PM

## 2023-07-18 NOTE — Consult Note (Signed)
Consultation Note Date: 07/18/2023   Patient Name: Matthew Rivera  DOB: [birth year]  MRN: [id number]  Age / Sex: 62 y.o., male  PCP: [name], MD Referring Physician: Shon Hale, MD  Reason for Consultation: Establishing goals of care  HPI/Patient Profile: 62 y.o. male  with past medical history of non-small cell lung cancer with metastatic burden to the brain, craniotomy June of this year, radiation treatment, chemo currently on hold due to poor tolerance even at reduced dose, PE, 2 recent hospitalizations 8/21-24 for multifocal pneumonia with cavitary lesion and acute PE, also hospitalized 9/30 through 10/3 for sepsis secondary to HCAP treated with IV Vanco and cefepime, long-term smoker, osteoarthritis, GERD, HLD admitted on 07/17/2023 with acute on chronic respiratory failure with hypoxia, HCAP.  Per notes following with Dr. Arbutus Ped for cancer treatment who is considering modifying treatment to [facility] alone in the future.  Clinical Assessment and Goals of Care: I have reviewed medical records including EPIC notes, labs and imaging, received report from RN, assessed the patient.  [name] is lying quietly in bed.  He appears acutely ill and frail.  He is resting comfortably but wakes easily.  He will make an somewhat keep eye contact.  He is clearly exhausted.  He asks that I speak with his wife, [name].  We meet at the bedside to discuss diagnosis prognosis, GOC, EOL wishes, disposition and options. I introduced Palliative Medicine as specialized medical care for people living with serious illness. It focuses on providing relief from the symptoms and stress of a serious illness. The goal is to improve quality of life for both the patient and the family.  We discussed a brief life review of the patient.  [name] shares that she and Mr. State will be married 2 years this May.  She shares that they have been  together for 7 or 8 years.  [name] has a son [name], and 3 stepdaughters.  [name] has been assisting with ADLs.  We then focused on their current illness.  We talk about treatment plan from trusted oncologist, Dr. Arbutus Ped.  [name] shares a story of decline after each chemotherapy.  We also talk about plan from the pulmonology.  [name] shares that the goal is steroids for few days which can hopefully help [name] feel better.  We talked about time for outcomes.  The natural disease trajectory and expectations at EOL were discussed.  Advanced directives, concepts specific to code status, artifical feeding and hydration, and rehospitalization were considered and discussed.  Kim readily states that she and [name] have talked about CODE STATUS in the past many times.  She shares that neither 1 would want to be hooked up to machines to be kept alive.  She shares her experience with her first husband (of 26 years) who had a massive heart attack and died suddenly. I ask if they have discussed preferred place of death.  [name] states that they have not.  I encouraged her to consider, and we will follow-up tomorrow.  Discussed  the importance of continued conversation with family and the medical providers regarding overall plan of care and treatment options, ensuring decisions are within the context of the patient's values and GOCs. Questions and concerns were addressed.  The family was encouraged to call with questions or concerns.  PMT will continue to support holistically.  Conference with attending, bedside nursing staff, transition of care team related to patient condition, needs, goals of care, disposition.   HCPOA  NEXT OF KIN -wife, [name].   [name] shares that she and [name] will be married 2 years this May.  She shares that they have been together for 7 or 8 years.  [name] has a son [name], and 3 stepdaughters.    SUMMARY OF RECOMMENDATIONS   At this point continue to treat the treatable but no CPR or  intubation Time for outcomes Considering "next steps" PMT to continue goals of care discussions    Code Status/Advance Care Planning: DNR  Symptom Management:  Per hospitalist, no additional needs at this time.  Palliative Prophylaxis:  Frequent Pain Assessment and Oral Care  Additional Recommendations (Limitations, Scope, Preferences): Continue to treatment no CPR or intubation  Psycho-social/Spiritual:  Desire for further Chaplaincy support:no Additional Recommendations: Caregiving  Support/Resources and Education on Hospice  Prognosis:  Unable to determine, based on outcomes.  Guarded at this point  Discharge Planning: To Be Determined      Primary Diagnoses: Present on Admission:  Acute on chronic respiratory failure with hypoxia (HCC)  Adenosquamous carcinoma of left lung (HCC)  Pulmonary embolism (HCC)  HCAP (healthcare-associated pneumonia)  Rash  Hypokalemia   I have reviewed the medical record, interviewed the patient and family, and examined the patient. The following aspects are pertinent.  Past Medical History:  Diagnosis Date   Dyslipidemia    GERD (gastroesophageal reflux disease)    Lung nodule seen on imaging study 03/31/2023   seen on CT results   Osteoarthritis    Smoker    long term smoker 1 ppd   Social History   Socioeconomic History   Marital status: Married    Spouse name: Not on file   Number of children: Not on file   Years of education: Not on file   Highest education level: Not on file  Occupational History   Not on file  Tobacco Use   Smoking status: Every Day    Current packs/day: 0.50    Average packs/day: 1.5 packs/day for 52.8 years (78.4 ttl pk-yrs)    Types: Cigarettes    Start date: 70   Smokeless tobacco: Never   Tobacco comments:    Cutting back and wears nicotine patch since 03/31/23.  KM  Vaping Use   Vaping status: Never Used  Substance and Sexual Activity   Alcohol use: Not Currently    Comment: occas    Drug use: Never   Sexual activity: Yes  Other Topics Concern   Not on file  Social History Narrative   Not on file   Social Determinants of Health   Financial Resource Strain: Not on file  Food Insecurity: No Food Insecurity (07/17/2023)   Hunger Vital Sign    Worried About Running Out of Food in the Last Year: Never true    Ran Out of Food in the Last Year: Never true  Transportation Needs: No Transportation Needs (07/17/2023)   PRAPARE - Administrator, Civil Service (Medical): No    Lack of Transportation (Non-Medical): No  Physical Activity: Not  on file  Stress: Not on file  Social Connections: Not on file   Family History  Problem Relation Age of Onset   Diabetes Father    Asthma Father    Scheduled Meds:  apixaban  5 mg Oral BID   arformoterol  15 mcg Nebulization BID   And   umeclidinium bromide  1 puff Inhalation Daily   Chlorhexidine Gluconate Cloth  6 each Topical Daily   feeding supplement  1 Container Oral TID BM   feeding supplement  237 mL Oral TID BM   fluticasone  2 spray Each Nare Daily   guaiFENesin-dextromethorphan  15 mL Oral Q8H   methylPREDNISolone (SOLU-MEDROL) injection  40 mg Intravenous Daily   mirtazapine  7.5 mg Oral QHS   pantoprazole  40 mg Oral BID AC   Continuous Infusions:  ceFEPime (MAXIPIME) IV 2 g (07/18/23 1438)   metronidazole 500 mg (07/18/23 1024)   vancomycin     PRN Meds:.acetaminophen **OR** acetaminophen, albuterol, LORazepam, ondansetron **OR** ondansetron (ZOFRAN) IV, oxyCODONE, polyethylene glycol, tamsulosin Medications Prior to Admission:  Prior to Admission medications   Medication Sig Start Date End Date Taking? Authorizing Provider  albuterol (VENTOLIN HFA) 108 (90 Base) MCG/ACT inhaler Inhale 2 puffs into the lungs every 4 (four) hours as needed for wheezing or shortness of breath.   Yes [provider]  amoxicillin-clavulanate (AUGMENTIN) 875-125 MG tablet Take 1 tablet by mouth every 12  (twelve) hours for 4 days. 07/14/23 07/18/23 Yes Sheikh, Omair Latif, DO  apixaban (ELIQUIS) 5 MG TABS tablet Take 1 tablet (5 mg total) by mouth 2 (two) times daily. This prescription will not start until 9/20 so can place on hold Patient taking differently: Take 5 mg by mouth 2 (two) times daily. 07/01/23  Yes Dahal, Melina Schools, MD  feeding supplement (ENSURE ENLIVE / ENSURE PLUS) LIQD Take 237 mLs by mouth 3 (three) times daily between meals. 07/14/23  Yes Sheikh, Omair Latif, DO  ferrous sulfate 325 (65 FE) MG tablet Take 325 mg by mouth daily with breakfast.   Yes [provider]  fluticasone (FLONASE) 50 MCG/ACT nasal spray Place 2 sprays into both nostrils daily. 07/15/23  Yes Sheikh, Omair Latif, DO  folic acid (FOLVITE) 1 MG tablet Take 1 tablet (1 mg total) by mouth daily. 05/04/23  Yes Heilingoetter, Cassandra L, PA-C  lidocaine-prilocaine (EMLA) cream Apply 1 Application topically as needed. Patient taking differently: Apply 1 Application topically as needed (for port access- every 21 days). 05/04/23  Yes Heilingoetter, Cassandra L, PA-C  loratadine (CLARITIN) 10 MG tablet Take 1 tablet (10 mg total) by mouth daily. 07/15/23  Yes Sheikh, Omair Latif, DO  Multiple Vitamin (MULTIVITAMIN WITH MINERALS) TABS tablet Take 1 tablet by mouth daily. 07/14/23  Yes Sheikh, Omair Latif, DO  ondansetron (ZOFRAN) 4 MG tablet Take 1 tablet (4 mg total) by mouth every 8 (eight) hours as needed for nausea or vomiting. 07/14/23 07/13/24 Yes Sheikh, Omair Latif, DO  Oxycodone HCl 10 MG TABS Take 10 mg by mouth every 4 (four) hours as needed (for pain).   Yes [provider]  pantoprazole (PROTONIX) 40 MG tablet Take 1 tablet (40 mg total) by mouth 2 (two) times daily. Patient taking differently: Take 40 mg by mouth 2 (two) times daily before a meal. 04/01/23  Yes Rhetta Mura, MD  prochlorperazine (COMPAZINE) 10 MG tablet Take 1 tablet (10 mg total) by mouth every 6 (six) hours as needed for nausea  or vomiting. 06/15/23  Yes Heilingoetter, Cassandra  L, PA-C  tamsulosin (FLOMAX) 0.4 MG CAPS capsule Take 0.4 mg by mouth at bedtime as needed (urinary flow issues). 03/01/23  Yes [provider]  Tiotropium Bromide-Olodaterol (STIOLTO RESPIMAT) 2.5-2.5 MCG/ACT AERS Inhale 2 puffs into the lungs daily. 05/10/23  Yes Charlott Holler, MD  traMADol (ULTRAM) 50 MG tablet Take 50-100 mg by mouth 2 (two) times daily as needed (for pain). 06/14/23  Yes [provider]  TYLENOL 500 MG tablet Take 500-1,000 mg by mouth every 6 (six) hours as needed for mild pain or headache.   Yes [provider]   No Known Allergies Review of Systems  Unable to perform ROS: Acuity of condition    Physical Exam Vitals and nursing note reviewed.     Vital Signs: BP 116/79   Pulse (!) 125   Temp 99.2 F (37.3 C) (Oral)   Resp (!) 33   Ht 5\' 11"  (1.803 m)   Wt 62.1 kg   SpO2 91%   BMI 19.11 kg/m  Pain Scale: 0-10   Pain Score: 2    SpO2: SpO2: 91 % O2 Device:SpO2: 91 % O2 Flow Rate: .O2 Flow Rate (L/min): 6 L/min  IO: Intake/output summary:  Intake/Output Summary (Last 24 hours) at 07/18/2023 1528 Last data filed at 07/18/2023 1357 Gross per 24 hour  Intake 560 ml  Output 650 ml  Net -90 ml    LBM: Last BM Date : 07/17/23 Baseline Weight: Weight: 62.1 kg Most recent weight: Weight: 62.1 kg     Palliative Assessment/Data:     Time In: 1430 Time Out: 1525 Time Total: 55 minutes  Greater than 50%  of this time was spent counseling and coordinating care related to the above assessment and plan.  Signed by: Katheran Awe, NP   Please contact Palliative Medicine Team phone at [phone] for questions and concerns.  For individual provider: See Loretha Stapler

## 2023-07-18 NOTE — Progress Notes (Addendum)
PROGRESS NOTE   Matthew Rivera, is a 62 y.o. male, DOB - 1960/10/28, ZOX:[id number]  Admit date - 07/17/2023   Admitting Physician Matthew Boer, Rivera  Outpatient Primary Rivera for the patient is Matthew Rivera  LOS - 1  Chief Complaint  Patient presents with   Shortness of Breath      Brief Narrative:  62 y.o. male with medical history significant for Stage IV non-small cell lung cancer with brain mets, status post craniotomy and surgical resection on April 07, 2023 under the care of Matthew Rivera, s/p Radiation to the brain cavity by Matthew Rivera currently on hold due to patient's poor tolerance of chemo despite reduced dose chemo , H/o pulmonary embolism (History of Septic Emboli August 2024) on anticoagulation admitted on 07/17/2023 with acute on chronic hypoxic respiratory failure in the setting of recurrent healthcare associated pneumonia    -Assessment and Plan: 1)Acute on chronic respiratory failure with hypoxia (HCC) -Prior to admission patient was on 4 L of oxygen via nasal cannula -Currently requiring 8 to 10 L of oxygen via nasal cannula -Follow-up CT chest findings- (See detailed report) Constellation of findings favored to represent worsening multifocal pneumonia superimposed on a background of interstitial lung disease, few new or increased cavitary lesions for example in the right middle lobe. New moderate right pleural effusion and small left pleural effusion. -Continue IV cefepime, Flagyl and vancomycin for multifocal pneumonia -Chest ultrasound on 07/18/2023 without adequate fluid pocket for thoracentesis -Continue bronchodilators  2)HCAP (healthcare-associated pneumonia) 2 recent hospitalizations initially for multifocal pneumonia treated with ceftriazone and azitho, and subsequently admitted for HCAP- Treated with IV cefepime and vancomycin, discharged 10/3 to complete 4 more days of Augmentin which he was compliant with.   ---COVID test negative -  Procalcitonin elevated, but in the setting of lung cancer,? 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  -Get pulmonology consult  3)Hypokalemia/Hyponatremia -Replace potassium  4)H/o pulmonary embolism (History of Septic Emboli August 2024) on anticoagulation ----continue Eliquis  5)Stage IV non-small cell lung cancer with brain mets--- status post craniotomy and surgical resection on April 07, 2023 under the care of Matthew Rivera, s/p Radiation to the brain cavity by Matthew Rivera currently on hold due to patient's poor tolerance of chemo despite reduced dose chemo and recurrent infection Follows with Matthew Rivera,  Considering modifying treatment to [facility] alone in the future.  6)Rash Diffuse rash may be related to Chemo. But rash started before recent hospitalization 9/30 so doubt drug reaction, red man syndrome.   7) chronic anemia and thrombocytopenia--due to underlying malignancy and chemotherapy -Patient was previously neutropenic, WBC recovered after Granix injections -Hgb currently above 9, platelets currently above 100, no bleeding concerns, monitor closely while on Eliquis -Transfuse as clinically indicated  8)Social/Ethics--- plan of care and advanced directive discussed with patient, [name] and [name] at bedside -Official palliative care consult requested -Remains full code without limitations to treatment for now  9) moderate protein caloric malnutrition/anorexia--Remeron for appetite stimulation -Nutritional supplements requested -Dietitian consult requested   Status is: Inpatient   Disposition: The patient is from: Home              Anticipated d/c is to: SNF              Anticipated d/c date is: > 3 days              Patient currently is  not medically stable to d/c. Barriers: Not Clinically Stable-   Code Status :  -  Code  Status: Full Code   Family Communication:   (patient is alert, awake and coherent)  Discussed with daughter Matthew Rivera and wife Matthew Rivera at bedside  DVT Prophylaxis  :   - SCDs   Place and maintain sequential compression device Start: 07/17/23 2331   Lab Results  Component Value Date   PLT 109 (L) 07/18/2023    Inpatient Medications  Scheduled Meds:  arformoterol  15 mcg Nebulization BID   And   umeclidinium bromide  1 puff Inhalation Daily   Chlorhexidine Gluconate Cloth  6 each Topical Daily   feeding supplement  237 mL Oral TID BM   fluticasone  2 spray Each Nare Daily   guaiFENesin-dextromethorphan  15 mL Oral Q8H   pantoprazole  40 mg Oral BID AC   Continuous Infusions:  ceFEPime (MAXIPIME) IV Stopped (07/18/23 0610)   metronidazole 500 mg (07/18/23 1024)   vancomycin     PRN Meds:.acetaminophen **OR** acetaminophen, albuterol, LORazepam, ondansetron **OR** ondansetron (ZOFRAN) IV, oxyCODONE, polyethylene glycol, tamsulosin   Anti-infectives (From admission, onward)    Start     Dose/Rate Route Frequency Ordered Stop   07/18/23 2200  vancomycin (VANCOREADY) IVPB 1250 mg/250 mL        1,250 mg 166.7 mL/hr over 90 Minutes Intravenous Every 24 hours 07/17/23 2137     07/18/23 0600  ceFEPIme (MAXIPIME) 2 g in sodium chloride 0.9 % 100 mL IVPB        2 g 200 mL/hr over 30 Minutes Intravenous Every 8 hours 07/17/23 2137     07/17/23 2130  metroNIDAZOLE (FLAGYL) IVPB 500 mg        500 mg 100 mL/hr over 60 Minutes Intravenous Every 12 hours 07/17/23 2124     07/17/23 2130  vancomycin (VANCOREADY) IVPB 1250 mg/250 mL        1,250 mg 166.7 mL/hr over 90 Minutes Intravenous  Once 07/17/23 2127 07/18/23 0800   07/17/23 2130  ceFEPIme (MAXIPIME) 2 g in sodium chloride 0.9 % 100 mL IVPB        2 g 200 mL/hr over 30 Minutes Intravenous  Once 07/17/23 2127 07/18/23 1610       Subjective: Matthew Rivera today has no emesis,  No chest pain,   - Patient's daughter Matthew Rivera and wife came at  bedside, questions answered -Cough and dyspnea persist---  hypoxia persist unable to wean down O2 -Tmax 100.3  Objective: Vitals:   07/18/23 1100 07/18/23 1144 07/18/23 1200 07/18/23 1256  BP: 110/67  116/79   Pulse: (!) 122  (!) 124 (!) 125  Resp: (!) 31  (!) 22 (!) 33  Temp:  100.3 F (37.9 C)    TempSrc:  Oral    SpO2: 97%   91%  Weight:      Height:        Intake/Output Summary (Last 24 hours) at 07/18/2023 1330 Last data filed at 07/18/2023 0900 Gross per 24 hour  Intake 560 ml  Output 650 ml  Net -90 ml   Filed Weights   07/17/23 1412  Weight: 62.1 kg    Physical Exam  Gen:- Awake Alert, obvious respiratory distress, conversational dyspnea HEENT:- [place].AT, No sclera icterus Nose- [place] 10L/min Neck-Supple Neck,No JVD,.  Lungs-diminished breath sounds, especially on the right, few scattered wheezes  CV- S1, S2 normal, regular , tachycardic Abd-  +ve B.Sounds, Abd Soft, No tenderness, right chest Port-A-Cath  Extremity/Skin:- No  edema, pedal pulses present  Psych-affect is appropriate, oriented x3 Neuro-generalized weakness, no new focal deficits, no tremors  Data Reviewed: I have personally reviewed following labs and imaging studies  CBC: Recent Labs  Lab 07/12/23 0314 07/12/23 0856 07/12/23 2255 07/13/23 0452 07/14/23 0304 07/17/23 1419 07/18/23 0617  WBC 1.1*  --   --  1.8* 3.9* 8.9 8.7  NEUTROABS  --   --   --  0.9* 3.1 6.8  --   HGB 7.0*   < > 8.7* 8.8* 9.1* 11.0* 9.9*  HCT 21.2*   < > 26.0* 26.0* 27.3* 32.4* 28.8*  MCV 93.8  --   --  87.8 89.2 87.3 87.0  PLT 165  --   --  129* 124* 134* 109*   < > = values in this interval not displayed.   Basic Metabolic Panel: Recent Labs  Lab 07/12/23 0314 07/12/23 0856 07/13/23 0452 07/14/23 1020 07/17/23 1419 07/18/23 0617  NA 128*  --  129* 132* 128* 127*  K 3.4*  --  3.5 3.2* 3.3* 4.0  CL 96*  --  98 100 93* 95*  CO2 24  --  23 21* 23 25  GLUCOSE 97  --  89 96 97 108*  BUN 12  --  10 10 14 12    CREATININE 0.86  --  0.70 0.76 0.99 0.86  CALCIUM 7.7*  --  7.5* 7.7* 7.8* 7.4*  MG  --  1.4* 1.7 1.4*  --   --   PHOS  --   --   --  2.2*  --   --    GFR: Estimated Creatinine Clearance: 78.2 mL/min (by C-G formula based on SCr of 0.86 mg/dL). Liver Function Tests: Recent Labs  Lab 07/14/23 1020  AST 18  ALT 21  ALKPHOS 124  BILITOT 0.8  PROT 5.2*  ALBUMIN 1.8*   Recent Results (from the past 240 hour(s))  Culture, blood (routine x 2)     Status: None   Collection Time: 07/11/23  2:00 PM   Specimen: BLOOD  Result Value Ref Range Status   Specimen Description BLOOD RIGHT ANTECUBITAL  Final   Special Requests   Final    BOTTLES DRAWN AEROBIC AND ANAEROBIC Blood Culture adequate volume   Culture   Final    NO GROWTH 5 DAYS Performed at [facility], [address], [place], [place] [address]    Report Status 07/16/2023 FINAL  Final  Culture, blood (routine x 2)     Status: Abnormal   Collection Time: 07/11/23  2:37 PM   Specimen: BLOOD  Result Value Ref Range Status   Specimen Description   Final    BLOOD BLOOD LEFT ARM Performed at [facility], [address], [place], [place] [address]    Special Requests   Final    BOTTLES DRAWN AEROBIC AND ANAEROBIC Blood Culture adequate volume   Culture  Setup Time   Final    GRAM POSITIVE COCCI IN CLUSTERS BOTTLES DRAWN AEROBIC ONLY CRITICAL RESULT CALLED TO, READ BACK BY AND VERIFIED WITH: M [email or web address]  07/13/23 MK    Culture (A)  Final    MICROCOCCUS SPECIES MICROCOCCUS LUTEUS Standardized susceptibility testing for this organism is not available. Performed at [facility], [address], [place], [place] [address]    Report Status 07/13/2023 FINAL  Final  Blood Culture ID Panel (Reflexed)     Status: None   Collection Time: 07/11/23  2:37  PM  Result Value Ref Range Status   Enterococcus faecalis NOT DETECTED NOT DETECTED Final   Enterococcus Faecium NOT DETECTED NOT DETECTED  Final   Listeria monocytogenes NOT DETECTED NOT DETECTED Final   Staphylococcus species NOT DETECTED NOT DETECTED Final   Staphylococcus aureus (BCID) NOT DETECTED NOT DETECTED Final   Staphylococcus epidermidis NOT DETECTED NOT DETECTED Final   Staphylococcus lugdunensis NOT DETECTED NOT DETECTED Final   Streptococcus species NOT DETECTED NOT DETECTED Final   Streptococcus agalactiae NOT DETECTED NOT DETECTED Final   Streptococcus pneumoniae NOT DETECTED NOT DETECTED Final   Streptococcus pyogenes NOT DETECTED NOT DETECTED Final   A.calcoaceticus-baumannii NOT DETECTED NOT DETECTED Final   Bacteroides fragilis NOT DETECTED NOT DETECTED Final   Enterobacterales NOT DETECTED NOT DETECTED Final   Enterobacter cloacae complex NOT DETECTED NOT DETECTED Final   Escherichia coli NOT DETECTED NOT DETECTED Final   Klebsiella aerogenes NOT DETECTED NOT DETECTED Final   Klebsiella oxytoca NOT DETECTED NOT DETECTED Final   Klebsiella pneumoniae NOT DETECTED NOT DETECTED Final   Proteus species NOT DETECTED NOT DETECTED Final   Salmonella species NOT DETECTED NOT DETECTED Final   Serratia marcescens NOT DETECTED NOT DETECTED Final   Haemophilus influenzae NOT DETECTED NOT DETECTED Final   Neisseria meningitidis NOT DETECTED NOT DETECTED Final   Pseudomonas aeruginosa NOT DETECTED NOT DETECTED Final   Stenotrophomonas maltophilia NOT DETECTED NOT DETECTED Final   Candida albicans NOT DETECTED NOT DETECTED Final   Candida auris NOT DETECTED NOT DETECTED Final   Candida glabrata NOT DETECTED NOT DETECTED Final   Candida krusei NOT DETECTED NOT DETECTED Final   Candida parapsilosis NOT DETECTED NOT DETECTED Final   Candida tropicalis NOT DETECTED NOT DETECTED Final   Cryptococcus neoformans/gattii NOT DETECTED NOT DETECTED Final    Comment: Performed at [facility], [address], [place], [place] [address]  Resp panel by RT-PCR (RSV, Flu A&B, Covid) Anterior Nasal Swab     Status: None    Collection Time: 07/12/23  8:35 AM   Specimen: Anterior Nasal Swab  Result Value Ref Range Status   SARS Coronavirus 2 by RT PCR NEGATIVE NEGATIVE Final    Comment: (NOTE) SARS-CoV-2 target nucleic acids are NOT DETECTED.  The SARS-CoV-2 RNA is generally detectable in upper respiratory specimens during the acute phase of infection. The lowest concentration of SARS-CoV-2 viral copies this assay can detect is 138 copies/mL. A negative result does not preclude SARS-Cov-2 infection and should not be used as the sole basis for treatment or other patient management decisions. A negative result may occur with  improper specimen collection/handling, submission of specimen other than nasopharyngeal swab, presence of viral mutation(s) within the areas targeted by this assay, and inadequate number of viral copies(<138 copies/mL). A negative result must be combined with clinical observations, patient history, and epidemiological information. The expected result is Negative.  Fact Sheet for Patients:  BloggerCourse.com  Fact Sheet for Healthcare Providers:  SeriousBroker.it  This test is no t yet approved or cleared by the [place] FDA and  has been authorized for detection and/or diagnosis of SARS-CoV-2 by FDA under an Emergency Use Authorization (EUA). This EUA will remain  in effect (meaning this test can be used) for the duration of the COVID-19 declaration under Section 564(b)(1) of the Act, 21 U.S.C.section 360bbb-3(b)(1), unless the authorization is terminated  or revoked sooner.       Influenza A by PCR NEGATIVE NEGATIVE Final   Influenza B by PCR NEGATIVE  NEGATIVE Final    Comment: (NOTE) The Xpert Xpress SARS-CoV-2/FLU/RSV plus assay is intended as an aid in the diagnosis of influenza from Nasopharyngeal swab specimens and should not be used as a sole basis for treatment. Nasal washings and aspirates are unacceptable for  Xpert Xpress SARS-CoV-2/FLU/RSV testing.  Fact Sheet for Patients: BloggerCourse.com  Fact Sheet for Healthcare Providers: SeriousBroker.it  This test is not yet approved or cleared by the [place] FDA and has been authorized for detection and/or diagnosis of SARS-CoV-2 by FDA under an Emergency Use Authorization (EUA). This EUA will remain in effect (meaning this test can be used) for the duration of the COVID-19 declaration under Section 564(b)(1) of the Act, 21 U.S.C. section 360bbb-3(b)(1), unless the authorization is terminated or revoked.     Resp Syncytial Virus by PCR NEGATIVE NEGATIVE Final    Comment: (NOTE) Fact Sheet for Patients: BloggerCourse.com  Fact Sheet for Healthcare Providers: SeriousBroker.it  This test is not yet approved or cleared by the [place] FDA and has been authorized for detection and/or diagnosis of SARS-CoV-2 by FDA under an Emergency Use Authorization (EUA). This EUA will remain in effect (meaning this test can be used) for the duration of the COVID-19 declaration under Section 564(b)(1) of the Act, 21 U.S.C. section 360bbb-3(b)(1), unless the authorization is terminated or revoked.  Performed at [facility], [address] [place], [place] [address]   Urine Culture (for pregnant, neutropenic or urologic patients or patients with an indwelling urinary catheter)     Status: None   Collection Time: 07/12/23 10:01 AM   Specimen: Urine, Clean Catch  Result Value Ref Range Status   Specimen Description   Final    URINE, CLEAN CATCH Performed at [facility], [address] [place], [place] [address]    Special Requests   Final    NONE Performed at [facility], [address], [place], [place] [address]    Culture   Final    NO GROWTH Performed at [facility],  [address], [place], [place] [address]    Report Status 07/13/2023 FINAL  Final  Expectorated Sputum Assessment w Gram Stain, Rflx to Resp Cult     Status: None   Collection Time: 07/12/23 10:37 AM   Specimen: Expectorated Sputum  Result Value Ref Range Status   Specimen Description EXPECTORATED SPUTUM  Final   Special Requests NONE  Final   Sputum evaluation   Final    Sputum specimen not acceptable for testing.  Please recollect.   Alease Medina I RN @ 1141 ON 07/12/23 CAL Performed at [facility], [address], [place], [place] [address]    Report Status 07/12/2023 FINAL  Final  MRSA Next Gen by PCR, Nasal     Status: None   Collection Time: 07/13/23 11:25 AM   Specimen: Nasal Mucosa; Nasal Swab  Result Value Ref Range Status   MRSA by PCR Next Gen NOT DETECTED NOT DETECTED Final    Comment: (NOTE) The GeneXpert MRSA Assay (FDA approved for NASAL specimens only), is one component of a comprehensive MRSA colonization surveillance program. It is not intended to diagnose MRSA infection nor to guide or monitor treatment for MRSA infections. Test performance is not FDA approved in patients less than 25 years old. Performed at [facility], [address] [place], [place] [address]   Culture, blood (Routine X 2) w Reflex to ID Panel  Status: None   Collection Time: 07/13/23  3:58 PM   Specimen: BLOOD LEFT ARM  Result Value Ref Range Status   Specimen Description   Final    BLOOD LEFT ARM Performed at [facility], [address], [place], [place] [address]    Special Requests   Final    BOTTLES DRAWN AEROBIC AND ANAEROBIC Blood Culture adequate volume Performed at [facility], [address], [place], [place] [address]    Culture   Final    NO GROWTH 5 DAYS Performed at [facility], [address], [place], [place] [address]    Report Status 07/18/2023 FINAL  Final  Culture, blood (Routine X 2) w  Reflex to ID Panel     Status: None   Collection Time: 07/13/23  4:03 PM   Specimen: BLOOD RIGHT ARM  Result Value Ref Range Status   Specimen Description   Final    BLOOD RIGHT ARM Performed at [facility], [address] [place], [place] [address]    Special Requests   Final    BOTTLES DRAWN AEROBIC AND ANAEROBIC Blood Culture adequate volume Performed at [facility], [address] [place], [place] [address]    Culture   Final    NO GROWTH 5 DAYS Performed at [facility], [address], [place], [place] [address]    Report Status 07/18/2023 FINAL  Final  Blood culture (routine x 2)     Status: None (Preliminary result)   Collection Time: 07/17/23  2:19 PM   Specimen: Right Antecubital; Blood  Result Value Ref Range Status   Specimen Description   Final    RIGHT ANTECUBITAL BOTTLES DRAWN AEROBIC AND ANAEROBIC   Special Requests Blood Culture adequate volume  Final   Culture   Final    NO GROWTH < 24 HOURS Performed at [facility], [address], [place], [place] [address]    Report Status PENDING  Incomplete  Resp panel by RT-PCR (RSV, Flu A&B, Covid) Anterior Nasal Swab     Status: None   Collection Time: 07/17/23  2:19 PM   Specimen: Anterior Nasal Swab  Result Value Ref Range Status   SARS Coronavirus 2 by RT PCR NEGATIVE NEGATIVE Final    Comment: (NOTE) SARS-CoV-2 target nucleic acids are NOT DETECTED.  The SARS-CoV-2 RNA is generally detectable in upper respiratory specimens during the acute phase of infection. The lowest concentration of SARS-CoV-2 viral copies this assay can detect is 138 copies/mL. A negative result does not preclude SARS-Cov-2 infection and should not be used as the sole basis for treatment or other patient management decisions. A negative result may occur with  improper specimen collection/handling, submission of specimen other than nasopharyngeal swab, presence of viral mutation(s) within the areas  targeted by this assay, and inadequate number of viral copies(<138 copies/mL). A negative result must be combined with clinical observations, patient history, and epidemiological information. The expected result is Negative.  Fact Sheet for Patients:  BloggerCourse.com  Fact Sheet for Healthcare Providers:  SeriousBroker.it  This test is no t yet approved or cleared by the [place] FDA and  has been authorized for detection and/or diagnosis of SARS-CoV-2 by FDA under an Emergency Use Authorization (EUA). This EUA will remain  in effect (meaning this test can be used) for the duration of the COVID-19 declaration under Section 564(b)(1) of the Act, 21 U.S.C.section 360bbb-3(b)(1), unless the authorization is terminated  or  revoked sooner.       Influenza A by PCR NEGATIVE NEGATIVE Final   Influenza B by PCR NEGATIVE NEGATIVE Final    Comment: (NOTE) The Xpert Xpress SARS-CoV-2/FLU/RSV plus assay is intended as an aid in the diagnosis of influenza from Nasopharyngeal swab specimens and should not be used as a sole basis for treatment. Nasal washings and aspirates are unacceptable for Xpert Xpress SARS-CoV-2/FLU/RSV testing.  Fact Sheet for Patients: BloggerCourse.com  Fact Sheet for Healthcare Providers: SeriousBroker.it  This test is not yet approved or cleared by the [place] FDA and has been authorized for detection and/or diagnosis of SARS-CoV-2 by FDA under an Emergency Use Authorization (EUA). This EUA will remain in effect (meaning this test can be used) for the duration of the COVID-19 declaration under Section 564(b)(1) of the Act, 21 U.S.C. section 360bbb-3(b)(1), unless the authorization is terminated or revoked.     Resp Syncytial Virus by PCR NEGATIVE NEGATIVE Final    Comment: (NOTE) Fact Sheet for  Patients: BloggerCourse.com  Fact Sheet for Healthcare Providers: SeriousBroker.it  This test is not yet approved or cleared by the [place] FDA and has been authorized for detection and/or diagnosis of SARS-CoV-2 by FDA under an Emergency Use Authorization (EUA). This EUA will remain in effect (meaning this test can be used) for the duration of the COVID-19 declaration under Section 564(b)(1) of the Act, 21 U.S.C. section 360bbb-3(b)(1), unless the authorization is terminated or revoked.  Performed at [facility], [address], [place], [place] [address]   Blood culture (routine x 2)     Status: None (Preliminary result)   Collection Time: 07/17/23  2:34 PM   Specimen: BLOOD RIGHT HAND  Result Value Ref Range Status   Specimen Description   Final    BLOOD RIGHT HAND BOTTLES DRAWN AEROBIC AND ANAEROBIC   Special Requests   Final    Blood Culture results may not be optimal due to an excessive volume of blood received in culture bottles   Culture   Final    NO GROWTH < 24 HOURS Performed at [facility], [address], [place], [place] [address]    Report Status PENDING  Incomplete  MRSA Next Gen by PCR, Nasal     Status: None   Collection Time: 07/18/23  7:00 AM   Specimen: Nasal Mucosa; Nasal Swab  Result Value Ref Range Status   MRSA by PCR Next Gen NOT DETECTED NOT DETECTED Final    Comment: (NOTE) The GeneXpert MRSA Assay (FDA approved for NASAL specimens only), is one component of a comprehensive MRSA colonization surveillance program. It is not intended to diagnose MRSA infection nor to guide or monitor treatment for MRSA infections. Test performance is not FDA approved in patients less than 45 years old. Performed at [facility], [address] [place], [place] [address]     Radiology Studies: [place] CHEST (PLEURAL EFFUSION)  Result Date: 07/18/2023 CLINICAL DATA:  Bilateral pleural effusions. EXAM: CHEST  ULTRASOUND COMPARISON:  CT scan of July 17, 2023. FINDINGS: Small bilateral pleural effusions are noted with associated atelectasis of both lungs. No adequate fluid pocket for thoracentesis is noted. IMPRESSION: Small bilateral pleural effusions with associated atelectasis of both lower lobes. No adequate fluid pocket for thoracentesis. Electronically Signed   By: Lupita Raider M.D.   On: 07/18/2023 11:32   CT CHEST WO CONTRAST  Result Date: 07/17/2023 CLINICAL DATA:  Worsening dyspnea, cough, recent treatment of pneumonia. History of non-small cell lung cancer  of the left lung. Last chemotherapy on Monday. EXAM: CT CHEST WITHOUT CONTRAST TECHNIQUE: Multidetector CT imaging of the chest was performed following the standard protocol without IV contrast. RADIATION DOSE REDUCTION: This exam was performed according to the departmental dose-optimization program which includes automated exposure control, adjustment of the mA and/or kV according to patient size and/or use of iterative reconstruction technique. COMPARISON:  Chest radiograph earlier today and CTA chest 06/01/2023 FINDINGS: Cardiovascular: Normal heart size. Trace pericardial effusion. Coronary artery and aortic atherosclerotic calcification. Right chest wall port. Mediastinum/Nodes: Wispy secretions in the trachea extending into the right mainstem bronchus. Unremarkable esophagus. Unchanged mediastinal adenopathy. For example 1.3 cm AP window node on series 2/image 64 and 1.7 cm subcarinal node on 2/87. Lungs/Pleura: New moderate right pleural effusion and small left pleural effusion. Increased diffuse ground-glass and reticular opacities with architectural distortion and bronchiectasis/bronchiolectasis greatest in the lower lungs. Additional increased peribronchovascular consolidation in the bilateral lower lobes and right middle lobe. New thick walled cavitary lesion in the right middle lobe measuring 3.0 x 3.4 cm on series 4/image 92. Slightly  increased size of the nodular mass in the posterior right lower lobe measuring 2.4 x 1.4 cm (series 4/image 120, previously 2.2 x 1.3 cm. This now demonstrates suggestion of early central cavitation. Additional cavitary lesions are similar or decreased. For example the cavitary lesion in the central left upper lobe on series 4/image 58 now measuring 1.3 cm in greatest dimension, previously 1.9 cm. The irregular spiculated nodule in the left lower lobe has decreased in size now measuring 3.7 cm in greatest dimension, previously 4.6 cm. Upper Abdomen: Cholelithiasis.  No acute abnormality. Musculoskeletal: No acute fracture or destructive osseous lesion. IMPRESSION: 1. Constellation of findings favored to represent worsening multifocal pneumonia superimposed on a background of interstitial lung disease. 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 2. There are a few new or increased cavitary lesions for example in the right middle lobe. Some of the previously seen cavitary lesions are stable or decreased in size. 3. Increased size of the posterior right lower lobe nodule new central cavitation. Decreased size of the irregular spiculated nodule in the left lower lobe 4. New moderate right pleural effusion and small left pleural effusion. 5. Unchanged mediastinal adenopathy. Aortic Atherosclerosis (ICD10-I70.0). Electronically Signed   By: Minerva Fester M.D.   On: 07/17/2023 20:17   DG Chest Port 1 View  Result Date: 07/17/2023 CLINICAL DATA:  Shortness of breath. History of stage IV lung cancer and recent pneumonia. EXAM: PORTABLE CHEST 1 VIEW COMPARISON:  07/11/2023 FINDINGS: Right Port-A-Cath unchanged. Midline trachea. Normal heart size. Small volume right-sided pleural fluid and/or thickening especially laterally, similar. No left-sided pleural fluid. No pneumothorax. Minimally improved right mid and lower lung interstitial and airspace disease. Left mid and lower lung  interstitial and airspace disease is similar. IMPRESSION: Minimally improved right base aeration. Otherwise, similar appearance of bilateral lower lung predominant interstitial and airspace disease. In this patient with a history of lung cancer, this could represent pneumonia, lymphangitic tumor spread and/or treatment effects. Infectious process favored on 06/01/2023 CT. Electronically Signed   By: Jeronimo Greaves M.D.   On: 07/17/2023 16:10    Scheduled Meds:  arformoterol  15 mcg Nebulization BID   And   umeclidinium bromide  1 puff Inhalation Daily   Chlorhexidine Gluconate Cloth  6 each Topical Daily   feeding supplement  237 mL Oral TID BM   fluticasone  2 spray Each Nare Daily   guaiFENesin-dextromethorphan  15 mL Oral Q8H   pantoprazole  40 mg Oral BID AC   Continuous Infusions:  ceFEPime (MAXIPIME) IV Stopped (07/18/23 0610)   metronidazole 500 mg (07/18/23 1024)   vancomycin      LOS: 1 day   Shon Hale M.D on 07/18/2023 at 1:30 PM  Go to [email or web address] - for contact info  [facility] - Office  [phone]  If 7PM-7AM, please contact night-coverage [email or web address] 07/18/2023, 1:30 PM

## 2023-07-18 NOTE — Progress Notes (Signed)
  Interdisciplinary Goals of Care Family Meeting   Date carried out: 07/18/2023  Location of the meeting: Bedside  Member's involved: Physician and Family Member or next of kin  Durable Power of Attorney or acting medical decision maker: Wife -Matthew Rivera    Discussion: We discussed goals of care for Matthew Rivera .   Social/Ethics--- plan of care and advanced directive discussed with patient, [name] and [name] at bedside -Official palliative care consult requested -Remains full code without limitations to treatment for now  Code status:   Code Status: Full Code   Disposition: Continue current acute care  Time spent for the meeting: 36    Shon Hale, MD  07/18/2023, 1:54 PM

## 2023-07-18 NOTE — Progress Notes (Signed)
Patient request mask for oxygen instead of HFNC , Patient given venturi mask. 55 at 15lpm

## 2023-07-18 NOTE — Progress Notes (Signed)
Critical note  Rapid response was called due to patient's HR dropping to 52 and with O2 sat at 86%.  He was initially on 6 L of oxygen and this was increased to 15 L.  Patient was said to be confused and mental status was altered.  At bedside, breathing treatment was provided.  ABG showed respiratory alkalosis with hypoxia, D-dimer was 2.11, lactic acid was normal, procalcitonin 1.17 Patient was admitted due to acute on chronic respiratory failure with hypoxia and HCAP and was already started on IV antibiotics (vancomycin and cefepime).  We shall continue with same at this time Patient was moved to ICU for closer monitoring    BP 116/73 (BP Location: Left Arm)   Pulse (!) 131   Temp 98.1 F (36.7 C) (Oral)   Resp 16   Ht 5\' 11"  (1.803 m)   Wt 62.1 kg   SpO2 99%   BMI 19.11 kg/m   Please refer to admission H&P for details regarding the care of this patient  Total time:  37 minutes This includes time reviewing the chart including progress notes, labs, EKGs, taking medical decisions, ordering labs and documenting findings.

## 2023-07-18 NOTE — Progress Notes (Signed)
Pt has been tachy for most of the night HR 120-140. Went to give 0600 meds and noticed AMS, confusion, decreased o2 sats to 86%.  Increased O2 to 15L, HR dropped to 52.  Messaged MD and rapid called.  New orders given and pt tranferred to ICU for closer monitoring.ICU RN will notify family.

## 2023-07-18 NOTE — Plan of Care (Signed)
  Problem: Education: Goal: Knowledge of the prescribed therapeutic regimen will improve Outcome: Progressing   Problem: Clinical Measurements: Goal: Usual level of consciousness will be regained or maintained. Outcome: Progressing Goal: Neurologic status will improve Outcome: Progressing Goal: Ability to maintain intracranial pressure will improve Outcome: Progressing   Problem: Skin Integrity: Goal: Demonstration of wound healing without infection will improve Outcome: Progressing   Problem: Activity: Goal: Ability to tolerate increased activity will improve Outcome: Progressing   Problem: Clinical Measurements: Goal: Ability to maintain a body temperature in the normal range will improve Outcome: Progressing   Problem: Respiratory: Goal: Ability to maintain adequate ventilation will improve Outcome: Not Progressing Goal: Ability to maintain a clear airway will improve Outcome: Not Progressing   Problem: Education: Goal: Knowledge of General Education information will improve Description: Including pain rating scale, medication(s)/side effects and non-pharmacologic comfort measures Outcome: Progressing   Problem: Health Behavior/Discharge Planning: Goal: Ability to manage health-related needs will improve Outcome: Progressing   Problem: Clinical Measurements: Goal: Ability to maintain clinical measurements within normal limits will improve Outcome: Progressing Goal: Will remain free from infection Outcome: Progressing Goal: Diagnostic test results will improve Outcome: Progressing Goal: Respiratory complications will improve Outcome: Progressing Goal: Cardiovascular complication will be avoided Outcome: Progressing   Problem: Activity: Goal: Risk for activity intolerance will decrease Outcome: Not Progressing   Problem: Nutrition: Goal: Adequate nutrition will be maintained Outcome: Progressing   Problem: Coping: Goal: Level of anxiety will  decrease Outcome: Progressing   Problem: Elimination: Goal: Will not experience complications related to bowel motility Outcome: Progressing Goal: Will not experience complications related to urinary retention Outcome: Progressing   Problem: Pain Managment: Goal: General experience of comfort will improve Outcome: Progressing   Problem: Safety: Goal: Ability to remain free from injury will improve Outcome: Progressing

## 2023-07-18 NOTE — TOC CM/SW Note (Signed)
Patient is high risk for readmission. Chart reviewed and pt moved to ICU overnight. TOC will assess pt once more medically stable and able to complete assessment. TOC continues to follow.

## 2023-07-18 NOTE — Consult Note (Addendum)
NAME:  Matthew Rivera, MRN:  [id number], DOB:  1961/02/26, LOS: 1 ADMISSION DATE:  07/17/2023, CONSULTATION DATE:  07/18/2023 REFERRING MD:  Shon Hale, MD, CHIEF COMPLAINT:  respiratory failure   History of Present Illness:  Matthew Rivera is a 62 y.o. man with with a past medical history of stage IV lung cancer with brain metastases who presents with worsening respiratory failure and shortness of breath.  He has had progressive clinical decline over the last few months.  He was diagnosed in June 2024.  Since then has had craniotomy and surgical resection of metastases as well as brain radiation and systemic chemotherapy.  His clinical course has been complicated by intolerance to chemotherapy, as well as multifocal pneumonia and acute pulmonary embolism for which she is now on Eliquis.  He was just discharged last week for healthcare associated pneumonia.  Unfortunately soon after coming home he had worsening shortness of breath from his 3 L nasal cannula and needed more oxygen.  He presented to the ED.  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  Pulmonary has been consulted in the setting of worsening respiratory failure and to see if any other treatment options can be offered.  He is currently on broad-spectrum antibiotics with vancomycin and cefepime.  Pertinent  Medical History  COPD   Significant Hospital Events: Including procedures, antibiotic start and stop dates in addition to other pertinent events     Interim History / Subjective:    Objective   Blood pressure (!) 101/55, pulse (!) 120, temperature 99.2 F (37.3 C), temperature source Oral, resp. rate (!) 21, height 5\' 11"  (1.803 m), weight 62.1 kg, SpO2 98%.    FiO2 (%):  [55 %] 55 %   Intake/Output Summary (Last 24 hours) at 07/18/2023 1629 Last data filed  at 07/18/2023 1528 Gross per 24 hour  Intake 760 ml  Output 650 ml  Net 110 ml   Filed Weights   07/17/23 1412  Weight: 62.1 kg    Examination: General: chronically and acutely ill appearing HENT: on nasal cannula Lungs: increased wob, wheezing per RN Cardiovascular: tachycardic, regular on tele Abdomen: not visibly distended Neuro: normal speech moving all 4 extremities   Resolved Hospital Problem list     Assessment & Plan:   Acute on chronic hypoxemic respiratory failure Stage IV lung cancer with progression of disease Acute pulmonary embolism, provoked in the setting of malignancy Possible HAP Possible COPD exacerbation  I have reviewed the CT scan which is 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  He did have ultrasound at a pen for possible thoracentesis but there was no pocket of fluid amenable to thoracentesis.  At this point I think a trial of steroids is reasonable to given the diffuse groundglass opacities suggesting inflammatory process.  I would continue the broad-spectrum antibiotics and bronchodilators as they are ordered.  I have discussed with the patient's family that life support should he need to progress to that will not cure or treat anything and that we should seriously start discussing goals of care in the event that his cancer progresses despite all aggressive treatment options.  I think we are severely limited with treatment options at this time.  Agree with palliative care consult.  In the interim since I examined him this morning he has been seen by palliative care and his CODE STATUS has been switched to DNR.  I think this is medically appropriate.  He will continue remain in the ICU for close monitoring for impending respiratory failure.  Pulmonary will follow tomorrow.  Best Practice (right click and "Reselect all SmartList Selections" daily)   Diet/type: Regular consistency (see orders) DVT prophylaxis: DOAC GI prophylaxis: N/A Lines:  N/A Foley:  N/A Code Status:  DNR Last date of multidisciplinary goals of care discussion [today. DNR per palliative care]  I spent 60 minutes in total visit time for this patient, with more than 50% spent counseling/coordinating care.  Durel Salts, MD Pulmonary and Critical Care Medicine [facility] 07/18/2023 4:41 PM Pager: see AMION  If no response to pager, please call critical care on call (see AMION) until 7pm After 7:00 pm call Elink      Labs   CBC: Recent Labs  Lab 07/12/23 0314 07/12/23 0856 07/12/23 2255 07/13/23 0452 07/14/23 0304 07/17/23 1419 07/18/23 0617  WBC 1.1*  --   --  1.8* 3.9* 8.9 8.7  NEUTROABS  --   --   --  0.9* 3.1 6.8  --   HGB 7.0*   < > 8.7* 8.8* 9.1* 11.0* 9.9*  HCT 21.2*   < > 26.0* 26.0* 27.3* 32.4* 28.8*  MCV 93.8  --   --  87.8 89.2 87.3 87.0  PLT 165  --   --  129* 124* 134* 109*   < > = values in this interval not displayed.    Basic Metabolic Panel: Recent Labs  Lab 07/12/23 0314 07/12/23 0856 07/13/23 0452 07/14/23 1020 07/17/23 1419 07/18/23 0617  NA 128*  --  129* 132* 128* 127*  K 3.4*  --  3.5 3.2* 3.3* 4.0  CL 96*  --  98 100 93* 95*  CO2 24  --  23 21* 23 25  GLUCOSE 97  --  89 96 97 108*  BUN 12  --  10 10 14 12   CREATININE 0.86  --  0.70 0.76 0.99 0.86  CALCIUM 7.7*  --  7.5* 7.7* 7.8* 7.4*  MG  --  1.4* 1.7 1.4*  --   --   PHOS  --   --   --  2.2*  --   --    GFR: Estimated Creatinine Clearance: 78.2 mL/min (by C-G formula based on SCr of 0.86 mg/dL). Recent Labs  Lab 07/13/23 0452 07/14/23 0304 07/17/23 1419 07/17/23 1748 07/18/23 0617 07/18/23 0620 07/18/23 0623 07/18/23 0921  PROCALCITON  --   --   --  0.89  --  1.17  --   --   WBC 1.8* 3.9* 8.9  --  8.7  --   --   --   LATICACIDVEN  --   --  1.7  --   --   --  1.4 1.5    Liver Function Tests: Recent Labs  Lab 07/14/23 1020  AST 18  ALT 21  ALKPHOS 124  BILITOT 0.8  PROT 5.2*  ALBUMIN 1.8*   No results for input(s):  "LIPASE", "AMYLASE" in the last 168 hours. No results for input(s): "AMMONIA" in the last 168 hours.  ABG    Component Value Date/Time   PHART 7.5 (H) 07/18/2023 0551   PCO2ART 32 07/18/2023 0551   PO2ART 58 (L) 07/18/2023 0551   HCO3 25.0 07/18/2023 0551   O2SAT 92.1 07/18/2023 0551     Coagulation Profile: No results for input(s): "INR", "PROTIME" in the last 168 hours.  Cardiac Enzymes: No results for input(s): "CKTOTAL", "CKMB", "CKMBINDEX", "  TROPONINI" in the last 168 hours.  HbA1C: Hgb A1c MFr Bld  Date/Time Value Ref Range Status  01/16/2011 06:12 AM  <5.7 % Final   5.1 (NOTE)                                                                       According to the ADA Clinical Practice Recommendations for 2011, when HbA1c is used as a screening test:   >=6.5%   Diagnostic of Diabetes Mellitus           (if abnormal result  is confirmed)  5.7-6.4%   Increased risk of developing Diabetes Mellitus  References:Diagnosis and Classification of Diabetes Mellitus,Diabetes Care,2011,34(Suppl 1):S62-S69 and Standards of Medical Care in         Diabetes - 2011,Diabetes Care,2011,34  (Suppl 1):S11-S61.    CBG: No results for input(s): "GLUCAP" in the last 168 hours.  Review of Systems:   +sob, cough with minimal sputum production  Past Medical History:  He,  has a past medical history of Dyslipidemia, GERD (gastroesophageal reflux disease), Lung nodule seen on imaging study (03/31/2023), Osteoarthritis, and Smoker.   Surgical History:   Past Surgical History:  Procedure Laterality Date   BACK SURGERY     x 1   COLONOSCOPY WITH PROPOFOL N/A 08/18/2020   Procedure: COLONOSCOPY WITH PROPOFOL;  Surgeon: Lanelle Bal, Matthew;  Location: [facility];  Service: Endoscopy;  Laterality: N/A;  7:30am   CRANIOTOMY Right 04/07/2023   Procedure: Right suboccipital craniotomy for tumor resection;  Surgeon: Tressie Stalker, MD;  Location: [facility] OR;  Service: Neurosurgery;  Laterality: Right;   TO follow   IR IMAGING GUIDED PORT INSERTION  05/12/2023   MULTIPLE TOOTH EXTRACTIONS     wears full dentures   POLYPECTOMY  08/18/2020   Procedure: POLYPECTOMY INTESTINAL;  Surgeon: Lanelle Bal, Matthew;  Location: [facility];  Service: Endoscopy;;     Social History:   reports that he has been smoking cigarettes. He started smoking about 52 years ago. He has a 78.4 pack-year smoking history. He has never used smokeless tobacco. He reports that he does not currently use alcohol. He reports that he does not use drugs.   Family History:  His family history includes Asthma in his father; Diabetes in his father.   Allergies No Known Allergies   Home Medications  Prior to Admission medications   Medication Sig Start Date End Date Taking? Authorizing Provider  albuterol (VENTOLIN HFA) 108 (90 Base) MCG/ACT inhaler Inhale 2 puffs into the lungs every 4 (four) hours as needed for wheezing or shortness of breath.   Yes [provider]  amoxicillin-clavulanate (AUGMENTIN) 875-125 MG tablet Take 1 tablet by mouth every 12 (twelve) hours for 4 days. 07/14/23 07/18/23 Yes Sheikh, Omair Latif, Matthew  apixaban (ELIQUIS) 5 MG TABS tablet Take 1 tablet (5 mg total) by mouth 2 (two) times daily. This prescription will not start until 9/20 so can place on hold Patient taking differently: Take 5 mg by mouth 2 (two) times daily. 07/01/23  Yes Dahal, Melina Schools, MD  feeding supplement (ENSURE ENLIVE / ENSURE PLUS) LIQD Take 237 mLs by mouth 3 (three) times daily between meals. 07/14/23  Yes Marguerita Merles Shickley, Matthew  ferrous  sulfate 325 (65 FE) MG tablet Take 325 mg by mouth daily with breakfast.   Yes [provider]  fluticasone (FLONASE) 50 MCG/ACT nasal spray Place 2 sprays into both nostrils daily. 07/15/23  Yes Sheikh, Omair Latif, Matthew  folic acid (FOLVITE) 1 MG tablet Take 1 tablet (1 mg total) by mouth daily. 05/04/23  Yes Heilingoetter, Cassandra L, PA-C  lidocaine-prilocaine (EMLA) cream Apply  1 Application topically as needed. Patient taking differently: Apply 1 Application topically as needed (for port access- every 21 days). 05/04/23  Yes Heilingoetter, Cassandra L, PA-C  loratadine (CLARITIN) 10 MG tablet Take 1 tablet (10 mg total) by mouth daily. 07/15/23  Yes Sheikh, Omair Latif, Matthew  Multiple Vitamin (MULTIVITAMIN WITH MINERALS) TABS tablet Take 1 tablet by mouth daily. 07/14/23  Yes Sheikh, Omair Latif, Matthew  ondansetron (ZOFRAN) 4 MG tablet Take 1 tablet (4 mg total) by mouth every 8 (eight) hours as needed for nausea or vomiting. 07/14/23 07/13/24 Yes Sheikh, Omair Latif, Matthew  Oxycodone HCl 10 MG TABS Take 10 mg by mouth every 4 (four) hours as needed (for pain).   Yes [provider]  pantoprazole (PROTONIX) 40 MG tablet Take 1 tablet (40 mg total) by mouth 2 (two) times daily. Patient taking differently: Take 40 mg by mouth 2 (two) times daily before a meal. 04/01/23  Yes Rhetta Mura, MD  prochlorperazine (COMPAZINE) 10 MG tablet Take 1 tablet (10 mg total) by mouth every 6 (six) hours as needed for nausea or vomiting. 06/15/23  Yes Heilingoetter, Cassandra L, PA-C  tamsulosin (FLOMAX) 0.4 MG CAPS capsule Take 0.4 mg by mouth at bedtime as needed (urinary flow issues). 03/01/23  Yes [provider]  Tiotropium Bromide-Olodaterol (STIOLTO RESPIMAT) 2.5-2.5 MCG/ACT AERS Inhale 2 puffs into the lungs daily. 05/10/23  Yes Charlott Holler, MD  traMADol (ULTRAM) 50 MG tablet Take 50-100 mg by mouth 2 (two) times daily as needed (for pain). 06/14/23  Yes [provider]  TYLENOL 500 MG tablet Take 500-1,000 mg by mouth every 6 (six) hours as needed for mild pain or headache.   Yes [provider]

## 2023-07-19 ENCOUNTER — Ambulatory Visit (HOSPITAL_COMMUNITY): Payer: 59

## 2023-07-19 ENCOUNTER — Inpatient Hospital Stay: Payer: 59

## 2023-07-19 DIAGNOSIS — J9621 Acute and chronic respiratory failure with hypoxia: Secondary | ICD-10-CM | POA: Diagnosis not present

## 2023-07-19 DIAGNOSIS — Z7189 Other specified counseling: Secondary | ICD-10-CM | POA: Diagnosis not present

## 2023-07-19 DIAGNOSIS — E43 Unspecified severe protein-calorie malnutrition: Secondary | ICD-10-CM

## 2023-07-19 DIAGNOSIS — Z515 Encounter for palliative care: Secondary | ICD-10-CM | POA: Diagnosis not present

## 2023-07-19 LAB — RENAL FUNCTION PANEL
Albumin: 1.7 g/dL — ABNORMAL LOW (ref 3.5–5.0)
Anion gap: 10 (ref 5–15)
BUN: 17 mg/dL (ref 8–23)
CO2: 23 mmol/L (ref 22–32)
Calcium: 7.4 mg/dL — ABNORMAL LOW (ref 8.9–10.3)
Chloride: 96 mmol/L — ABNORMAL LOW (ref 98–111)
Creatinine, Ser: 0.88 mg/dL (ref 0.61–1.24)
GFR, Estimated: >60 mL/min (ref >60)
Glucose, Bld: 143 mg/dL — ABNORMAL HIGH (ref 70–99)
Phosphorus: 2.9 mg/dL (ref 2.5–4.6)
Potassium: 3.4 mmol/L — ABNORMAL LOW (ref 3.5–5.1)
Sodium: 129 mmol/L — ABNORMAL LOW (ref 135–145)

## 2023-07-19 LAB — CBC
HCT: 33.7 % — ABNORMAL LOW (ref 39.0–52.0)
Hemoglobin: 11.1 g/dL — ABNORMAL LOW (ref 13.0–17.0)
MCH: 29.3 pg (ref 26.0–34.0)
MCHC: 32.9 g/dL (ref 30.0–36.0)
MCV: 88.9 fL (ref 80.0–100.0)
Platelets: 141 10*3/uL — ABNORMAL LOW (ref 150–400)
RBC: 3.79 MIL/uL — ABNORMAL LOW (ref 4.22–5.81)
RDW: 17.6 % — ABNORMAL HIGH (ref 11.5–15.5)
WBC: 17.1 10*3/uL — ABNORMAL HIGH (ref 4.0–10.5)
nRBC: 0.1 % (ref 0.0–0.2)

## 2023-07-19 LAB — GLUCOSE, CAPILLARY
Glucose-Capillary: 144 mg/dL — ABNORMAL HIGH (ref 70–99)
Glucose-Capillary: 170 mg/dL — ABNORMAL HIGH (ref 70–99)

## 2023-07-19 MED ORDER — BOOST PLUS PO LIQD
237.0000 mL | Freq: Three times a day (TID) | ORAL | Status: DC
  Administered 2023-07-20 – 2023-07-24 (×7): 237 mL via ORAL
  Filled 2023-07-19 (×23): qty 237

## 2023-07-19 MED ORDER — ADULT MULTIVITAMIN W/MINERALS CH
1.0000 | ORAL_TABLET | Freq: Every day | ORAL | Status: DC
  Administered 2023-07-19 – 2023-07-25 (×5): 1 via ORAL
  Filled 2023-07-19 (×8): qty 1

## 2023-07-19 NOTE — Progress Notes (Addendum)
PROGRESS NOTE  Matthew Rivera, is a 62 y.o. male, DOB - 07/01/1961, ZOX:[id number]  Admit date - 07/17/2023   Admitting Physician Onnie Boer, MD  Outpatient Primary MD for the patient is [name], MD  LOS - 2  Chief Complaint  Patient presents with   Shortness of Breath      Brief Narrative:  62 y.o. male with medical history significant for Stage IV non-small cell lung cancer with brain mets, status post craniotomy and surgical resection on April 07, 2023 under the care of [name], s/p Radiation to the brain cavity by Dr. Carlisle Beers currently on hold due to patient's poor tolerance of chemo despite reduced dose chemo , H/o pulmonary embolism (History of Septic Emboli August 2024) on anticoagulation admitted on 07/17/2023 with acute on chronic hypoxic respiratory failure in the setting of recurrent healthcare associated pneumonia    -Assessment and Plan: 1)Acute on Chronic Respiratory Failure with hypoxia (HCC) -Prior to admission patient was on 4 L of oxygen via nasal cannula -Currently requiring 9 to 10 L of oxygen via nasal cannula -Follow-up CT chest findings- (See detailed report) Constellation of findings favored to represent worsening multifocal pneumonia superimposed on a background of interstitial lung disease, few new or increased cavitary lesions for example in the right middle lobe. New moderate right pleural effusion and small left pleural effusion -Continue IV cefepime, Flagyl and vancomycin for multifocal pneumonia -Chest ultrasound on 07/18/2023 without adequate fluid pocket for thoracentesis -Continue bronchodilators =-Pulmonology consult appreciated -Solu-Medrol added by pulmonology on 07/18/2023 07/19/23 -Patient is less dyspneic at rest, becomes very dyspneic with change in position in bed or attempts to lean forward for exam -Unable to wean O2 down desaturates when oxygen is dropped below 9 L/min  2)HCAP (healthcare-associated pneumonia) 2  recent hospitalizations initially for multifocal pneumonia treated with ceftriazone and azitho, and subsequently admitted for HCAP- Treated with IV cefepime and vancomycin, discharged 10/3 to complete 4 more days of Augmentin which he was compliant with.   ---COVID test negative - Procalcitonin elevated, but in the setting of lung cancer,? 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, s/p Radiation to the brain cavity by Dr. Carlisle Beers currently on hold due to patient's poor tolerance of chemo despite reduced dose chemo and recurrent infection Follows with Dr. Arbutus Ped, ?? Keytruda alone in the future.  6)Rash Diffuse rash may be related to Chemo. But rash started before recent hospitalization 9/30 so doubt drug reaction, red man syndrome.   7) chronic anemia and thrombocytopenia--due to underlying malignancy and chemotherapy -Hgb currently above 11, platelets currently above 141 no bleeding concerns, monitor closely while on Eliquis -Hgb and platelets appear to be trending up -Transfuse as clinically indicated  8)Social/Ethics--- plan of care and advanced directive discussed with patient, his daughter [name] , son [name] and his wife [name] at bedside -Official palliative care consult appreciated --Patient is now a DNR/DNI  9)Moderate protein caloric malnutrition/anorexia--Remeron  for appetite stimulation -Nutritional supplements requested -Dietitian consult and speech pathologist consult appreciated  10)Leukocytosis--- WBC trending up since starting Solu-Medrol suspect this is partly steroid-induced leukocytosis -Continue antibiotics as above #1 pending culture data   Status is: Inpatient   Disposition: The patient is from: Home              Anticipated d/c is to: SNF              Anticipated d/c date is: > 3 days              Patient currently is not medically stable to d/c. Barriers: Not Clinically Stable-   Code Status :  -  Code Status: Limited: Do not attempt resuscitation (DNR) -DNR-LIMITED -Do Not Intubate/DNI    Family Communication:   (patient is alert, awake and coherent)  Discussed with daughter [name], son [name] and wife [name] at bedside  DVT Prophylaxis  :   - SCDs   Place and maintain sequential compression device Start: 07/17/23 2331 apixaban (ELIQUIS) tablet 5 mg   Lab Results  Component Value Date   PLT 141 (L) 07/19/2023   Inpatient Medications  Scheduled Meds:  apixaban  5 mg Oral BID   arformoterol  15 mcg Nebulization BID   And   umeclidinium bromide  1 puff Inhalation Daily   Chlorhexidine Gluconate Cloth  6 each Topical Daily   fluticasone  2 spray Each Nare Daily   lactose free nutrition  237 mL Oral TID BM   methylPREDNISolone (SOLU-MEDROL) injection  40 mg Intravenous Daily   mirtazapine  7.5 mg Oral QHS   multivitamin with minerals  1 tablet Oral Daily   pantoprazole  40 mg Oral BID AC   Continuous Infusions:  ceFEPime (MAXIPIME) IV 2 g (07/19/23 1327)   metronidazole 500 mg (07/19/23 0824)   vancomycin Stopped (07/19/23 0049)   PRN Meds:.acetaminophen **OR** acetaminophen, albuterol, LORazepam, ondansetron **OR** ondansetron (ZOFRAN) IV, oxyCODONE, polyethylene glycol, tamsulosin, traZODone   Anti-infectives (From admission, onward)    Start     Dose/Rate Route Frequency Ordered Stop   07/18/23 2200  vancomycin  (VANCOREADY) IVPB 1250 mg/250 mL        1,250 mg 166.7 mL/hr over 90 Minutes Intravenous Every 24 hours 07/17/23 2137     07/18/23 0600  ceFEPIme (MAXIPIME) 2 g in sodium chloride 0.9 % 100 mL IVPB        2 g 200 mL/hr over 30 Minutes Intravenous Every 8 hours 07/17/23 2137     07/17/23 2130  metroNIDAZOLE (FLAGYL) IVPB 500 mg        500 mg 100 mL/hr over 60 Minutes Intravenous Every 12 hours 07/17/23 2124     07/17/23 2130  vancomycin (VANCOREADY) IVPB 1250 mg/250 mL        1,250 mg 166.7 mL/hr over 90 Minutes Intravenous  Once 07/17/23 2127 07/18/23 0800   07/17/23 2130  ceFEPIme (MAXIPIME) 2 g in sodium chloride 0.9 % 100 mL IVPB        2 g 200 mL/hr over 30 Minutes Intravenous  Once 07/17/23 2127 07/18/23 1610      Subjective: Camron Orsak today has no emesis,  No chest pain,    Pt's cousins x 2 , daughter and wife are at bedside - Patient is less dyspneic at rest, becomes very dyspneic with change in position in bed or attempts to lean forward for exam -Unable to wean O2 down desaturates when oxygen is dropped below 9 L/min -Appetite is Not great  No Nausea, Vomiting or Diarrhea  Objective: Vitals:  07/19/23 0523 07/19/23 0725 07/19/23 0729 07/19/23 0830  BP: 103/68 102/72  107/73  Pulse: (!) 105 99  (!) 116  Resp: (!) 26 (!) 24  15  Temp:    98 F (36.7 C)  TempSrc:    Oral  SpO2: 98% 100% 95% 93%  Weight:      Height:        Intake/Output Summary (Last 24 hours) at 07/19/2023 1437 Last data filed at 07/19/2023 0557 Gross per 24 hour  Intake 651.67 ml  Output 450 ml  Net 201.67 ml   Filed Weights   07/17/23 1412 07/19/23 0500  Weight: 62.1 kg 61.2 kg   Physical Exam Gen:- Awake Alert, frail and chronically ill-appearing , less short of breath HEENT:- [place].AT, No sclera icterus Nose- [employer] 10L/min Neck-Supple Neck,No JVD,.  Lungs-diminished breath sounds, especially on the right, few scattered wheezes  CV- S1, S2 normal, regular , tachycardic Abd-  +ve  B.Sounds, Abd Soft, No tenderness, right chest Port-A-Cath    Extremity/Skin:- No  edema, pedal pulses present  Psych-affect is appropriate, oriented x3 Neuro-generalized weakness, no new focal deficits, no tremors  Data Reviewed: I have personally reviewed following labs and imaging studies  CBC: Recent Labs  Lab 07/13/23 0452 07/14/23 0304 07/17/23 1419 07/18/23 0617 07/19/23 0820  WBC 1.8* 3.9* 8.9 8.7 17.1*  NEUTROABS 0.9* 3.1 6.8  --   --   HGB 8.8* 9.1* 11.0* 9.9* 11.1*  HCT 26.0* 27.3* 32.4* 28.8* 33.7*  MCV 87.8 89.2 87.3 87.0 88.9  PLT 129* 124* 134* 109* 141*   Basic Metabolic Panel: Recent Labs  Lab 07/13/23 0452 07/14/23 1020 07/17/23 1419 07/18/23 0617 07/19/23 0820  NA 129* 132* 128* 127* 129*  K 3.5 3.2* 3.3* 4.0 3.4*  CL 98 100 93* 95* 96*  CO2 23 21* 23 25 23   GLUCOSE 89 96 97 108* 143*  BUN 10 10 14 12 17   CREATININE 0.70 0.76 0.99 0.86 0.88  CALCIUM 7.5* 7.7* 7.8* 7.4* 7.4*  MG 1.7 1.4*  --   --   --   PHOS  --  2.2*  --   --  2.9   GFR: Estimated Creatinine Clearance: 75.3 mL/min (by C-G formula based on SCr of 0.88 mg/dL). Liver Function Tests: Recent Labs  Lab 07/14/23 1020 07/19/23 0820  AST 18  --   ALT 21  --   ALKPHOS 124  --   BILITOT 0.8  --   PROT 5.2*  --   ALBUMIN 1.8* 1.7*   Recent Results (from the past 240 hour(s))  Culture, blood (routine x 2)     Status: None   Collection Time: 07/11/23  2:00 PM   Specimen: BLOOD  Result Value Ref Range Status   Specimen Description BLOOD RIGHT ANTECUBITAL  Final   Special Requests   Final    BOTTLES DRAWN AEROBIC AND ANAEROBIC Blood Culture adequate volume   Culture   Final    NO GROWTH 5 DAYS Performed at [facility], [address], [place], [place] [address]    Report Status 07/16/2023 FINAL  Final  Culture, blood (routine x 2)     Status: Abnormal   Collection Time: 07/11/23  2:37 PM   Specimen: BLOOD  Result Value Ref Range Status   Specimen Description   Final     BLOOD BLOOD LEFT ARM Performed at [facility], [address], [place], [place] [address]    Special Requests   Final  BOTTLES DRAWN AEROBIC AND ANAEROBIC Blood Culture adequate volume   Culture  Setup Time   Final    GRAM POSITIVE COCCI IN CLUSTERS BOTTLES DRAWN AEROBIC ONLY CRITICAL RESULT CALLED TO, READ BACK BY AND VERIFIED WITH: M LILLISTON,[email or web address]@[email or web address]  07/13/23 MK    Culture (A)  Final    MICROCOCCUS SPECIES MICROCOCCUS LUTEUS Standardized susceptibility testing for this organism is not available. Performed at [facility], [address] [place], [place] [address]    Report Status 07/13/2023 FINAL  Final  Blood Culture ID Panel (Reflexed)     Status: None   Collection Time: 07/11/23  2:37 PM  Result Value Ref Range Status   Enterococcus faecalis NOT DETECTED NOT DETECTED Final   Enterococcus Faecium NOT DETECTED NOT DETECTED Final   Listeria monocytogenes NOT DETECTED NOT DETECTED Final   Staphylococcus species NOT DETECTED NOT DETECTED Final   Staphylococcus aureus (BCID) NOT DETECTED NOT DETECTED Final   Staphylococcus epidermidis NOT DETECTED NOT DETECTED Final   Staphylococcus lugdunensis NOT DETECTED NOT DETECTED Final   Streptococcus species NOT DETECTED NOT DETECTED Final   Streptococcus agalactiae NOT DETECTED NOT DETECTED Final   Streptococcus pneumoniae NOT DETECTED NOT DETECTED Final   Streptococcus pyogenes NOT DETECTED NOT DETECTED Final   A.calcoaceticus-baumannii NOT DETECTED NOT DETECTED Final   Bacteroides fragilis NOT DETECTED NOT DETECTED Final   Enterobacterales NOT DETECTED NOT DETECTED Final   Enterobacter cloacae complex NOT DETECTED NOT DETECTED Final   Escherichia coli NOT DETECTED NOT DETECTED Final   Klebsiella aerogenes NOT DETECTED NOT DETECTED Final   Klebsiella oxytoca NOT DETECTED NOT DETECTED Final   Klebsiella pneumoniae NOT DETECTED NOT DETECTED Final   Proteus species NOT DETECTED NOT DETECTED Final    Salmonella species NOT DETECTED NOT DETECTED Final   Serratia marcescens NOT DETECTED NOT DETECTED Final   Haemophilus influenzae NOT DETECTED NOT DETECTED Final   Neisseria meningitidis NOT DETECTED NOT DETECTED Final   Pseudomonas aeruginosa NOT DETECTED NOT DETECTED Final   Stenotrophomonas maltophilia NOT DETECTED NOT DETECTED Final   Candida albicans NOT DETECTED NOT DETECTED Final   Candida auris NOT DETECTED NOT DETECTED Final   Candida glabrata NOT DETECTED NOT DETECTED Final   Candida krusei NOT DETECTED NOT DETECTED Final   Candida parapsilosis NOT DETECTED NOT DETECTED Final   Candida tropicalis NOT DETECTED NOT DETECTED Final   Cryptococcus neoformans/gattii NOT DETECTED NOT DETECTED Final    Comment: Performed at [facility], [address] [place], [place] [address]  Resp panel by RT-PCR (RSV, Flu A&B, Covid) Anterior Nasal Swab     Status: None   Collection Time: 07/12/23  8:35 AM   Specimen: Anterior Nasal Swab  Result Value Ref Range Status   SARS Coronavirus 2 by RT PCR NEGATIVE NEGATIVE Final    Comment: (NOTE) SARS-CoV-2 target nucleic acids are NOT DETECTED.  The SARS-CoV-2 RNA is generally detectable in upper respiratory specimens during the acute phase of infection. The lowest concentration of SARS-CoV-2 viral copies this assay can detect is 138 copies/mL. A negative result does not preclude SARS-Cov-2 infection and should not be used as the sole basis for treatment or other patient management decisions. A negative result may occur with  improper specimen collection/handling, submission of specimen other than nasopharyngeal swab, presence of viral mutation(s) within the areas targeted by this assay, and inadequate number of viral copies(<138 copies/mL). A negative result must be combined with clinical observations, patient history, and epidemiological information. The expected result is Negative.  Fact Sheet for Patients:   BloggerCourse.com  Fact Sheet for Healthcare Providers:  SeriousBroker.it  This test is no t yet approved or cleared by the [place] FDA and  has been authorized for detection and/or diagnosis of SARS-CoV-2 by FDA under an Emergency Use Authorization (EUA). This EUA will remain  in effect (meaning this test can be used) for the duration of the COVID-19 declaration under Section 564(b)(1) of the Act, 21 U.S.C.section 360bbb-3(b)(1), unless the authorization is terminated  or revoked sooner.       Influenza A by PCR NEGATIVE NEGATIVE Final   Influenza B by PCR NEGATIVE NEGATIVE Final    Comment: (NOTE) The Xpert Xpress SARS-CoV-2/FLU/RSV plus assay is intended as an aid in the diagnosis of influenza from Nasopharyngeal swab specimens and should not be used as a sole basis for treatment. Nasal washings and aspirates are unacceptable for Xpert Xpress SARS-CoV-2/FLU/RSV testing.  Fact Sheet for Patients: BloggerCourse.com  Fact Sheet for Healthcare Providers: SeriousBroker.it  This test is not yet approved or cleared by the [place] FDA and has been authorized for detection and/or diagnosis of SARS-CoV-2 by FDA under an Emergency Use Authorization (EUA). This EUA will remain in effect (meaning this test can be used) for the duration of the COVID-19 declaration under Section 564(b)(1) of the Act, 21 U.S.C. section 360bbb-3(b)(1), unless the authorization is terminated or revoked.     Resp Syncytial Virus by PCR NEGATIVE NEGATIVE Final    Comment: (NOTE) Fact Sheet for Patients: BloggerCourse.com  Fact Sheet for Healthcare Providers: SeriousBroker.it  This test is not yet approved or cleared by the [place] FDA and has been authorized for detection and/or diagnosis of SARS-CoV-2 by FDA under an Emergency Use  Authorization (EUA). This EUA will remain in effect (meaning this test can be used) for the duration of the COVID-19 declaration under Section 564(b)(1) of the Act, 21 U.S.C. section 360bbb-3(b)(1), unless the authorization is terminated or revoked.  Performed at [facility], [address] [place], [place] [address]   Urine Culture (for pregnant, neutropenic or urologic patients or patients with an indwelling urinary catheter)     Status: None   Collection Time: 07/12/23 10:01 AM   Specimen: Urine, Clean Catch  Result Value Ref Range Status   Specimen Description   Final    URINE, CLEAN CATCH Performed at [facility], [address] [place], [place] [address]    Special Requests   Final    NONE Performed at [facility], [address], [place], [place] [address]    Culture   Final    NO GROWTH Performed at [facility], [address], [place], [place] [address]    Report Status 07/13/2023 FINAL  Final  Expectorated Sputum Assessment w Gram Stain, Rflx to Resp Cult     Status: None   Collection Time: 07/12/23 10:37 AM   Specimen: Expectorated Sputum  Result Value Ref Range Status   Specimen Description EXPECTORATED SPUTUM  Final   Special Requests NONE  Final   Sputum evaluation   Final    Sputum specimen not acceptable for testing.  Please recollect.   Alease Medina I RN @ 1141 ON 07/12/23 CAL Performed at [facility], [address], [place], [place] [address]    Report Status 07/12/2023 FINAL  Final  MRSA Next Gen by PCR, Nasal     Status: None   Collection Time: 07/13/23 11:25 AM   Specimen:  Nasal Mucosa; Nasal Swab  Result Value Ref Range Status   MRSA by PCR Next Gen NOT DETECTED NOT DETECTED Final    Comment: (NOTE) The GeneXpert MRSA Assay (FDA approved for NASAL specimens only), is one component of a comprehensive MRSA colonization surveillance program. It is not intended to  diagnose MRSA infection nor to guide or monitor treatment for MRSA infections. Test performance is not FDA approved in patients less than 41 years old. Performed at [facility], [address], [place], [place] [address]   Culture, blood (Routine X 2) w Reflex to ID Panel     Status: None   Collection Time: 07/13/23  3:58 PM   Specimen: BLOOD LEFT ARM  Result Value Ref Range Status   Specimen Description   Final    BLOOD LEFT ARM Performed at [facility], [address], [place], [place] [address]    Special Requests   Final    BOTTLES DRAWN AEROBIC AND ANAEROBIC Blood Culture adequate volume Performed at [facility], [address], [place], [place] [address]    Culture   Final    NO GROWTH 5 DAYS Performed at [facility], [address], [place], [place] [address]    Report Status 07/18/2023 FINAL  Final  Culture, blood (Routine X 2) w Reflex to ID Panel     Status: None   Collection Time: 07/13/23  4:03 PM   Specimen: BLOOD RIGHT ARM  Result Value Ref Range Status   Specimen Description   Final    BLOOD RIGHT ARM Performed at [facility], [address] [place], [place] [address]    Special Requests   Final    BOTTLES DRAWN AEROBIC AND ANAEROBIC Blood Culture adequate volume Performed at [facility], [address], [place], [place] [address]    Culture   Final    NO GROWTH 5 DAYS Performed at [facility], [address], [place], [place] [address]    Report Status 07/18/2023 FINAL  Final  Blood culture (routine x 2)     Status: None (Preliminary result)   Collection Time: 07/17/23  2:19 PM   Specimen: Right Antecubital; Blood  Result Value Ref Range Status   Specimen Description   Final    RIGHT ANTECUBITAL BOTTLES DRAWN AEROBIC AND ANAEROBIC   Special Requests Blood Culture adequate volume  Final   Culture   Final    NO GROWTH 2 DAYS Performed at [facility], [address] [place], [place] [address]    Report Status PENDING  Incomplete  Resp panel by RT-PCR (RSV, Flu A&B, Covid) Anterior Nasal Swab     Status: None   Collection Time: 07/17/23  2:19 PM   Specimen: Anterior Nasal Swab  Result Value Ref Range Status   SARS Coronavirus 2 by RT PCR NEGATIVE NEGATIVE Final    Comment: (NOTE) SARS-CoV-2 target nucleic acids are NOT DETECTED.  The SARS-CoV-2 RNA is generally detectable in upper respiratory specimens during the acute phase of infection. The lowest concentration of SARS-CoV-2 viral copies this assay can detect is 138 copies/mL. A negative result does not preclude SARS-Cov-2 infection and should not be used as the sole basis for treatment or other patient management decisions. A negative result may occur with  improper specimen collection/handling, submission of specimen other than nasopharyngeal swab, presence of viral mutation(s) within the areas targeted by this assay, and inadequate number of viral copies(<138  copies/mL). A negative result must be combined with clinical observations, patient history, and epidemiological information. The expected result is Negative.  Fact Sheet for Patients:  BloggerCourse.com  Fact Sheet for Healthcare Providers:  SeriousBroker.it  This test is no t yet approved or cleared by the [place] FDA and  has been authorized for detection and/or diagnosis of SARS-CoV-2 by FDA under an Emergency Use Authorization (EUA). This EUA will remain  in effect (meaning this test can be used) for the duration of the COVID-19 declaration under Section 564(b)(1) of the Act, 21 U.S.C.section 360bbb-3(b)(1), unless the authorization is terminated  or revoked sooner.       Influenza A by PCR NEGATIVE NEGATIVE Final   Influenza B by PCR NEGATIVE NEGATIVE Final    Comment: (NOTE) The Xpert Xpress SARS-CoV-2/FLU/RSV plus assay is intended as an aid in the diagnosis of  influenza from Nasopharyngeal swab specimens and should not be used as a sole basis for treatment. Nasal washings and aspirates are unacceptable for Xpert Xpress SARS-CoV-2/FLU/RSV testing.  Fact Sheet for Patients: BloggerCourse.com  Fact Sheet for Healthcare Providers: SeriousBroker.it  This test is not yet approved or cleared by the [place] FDA and has been authorized for detection and/or diagnosis of SARS-CoV-2 by FDA under an Emergency Use Authorization (EUA). This EUA will remain in effect (meaning this test can be used) for the duration of the COVID-19 declaration under Section 564(b)(1) of the Act, 21 U.S.C. section 360bbb-3(b)(1), unless the authorization is terminated or revoked.     Resp Syncytial Virus by PCR NEGATIVE NEGATIVE Final    Comment: (NOTE) Fact Sheet for Patients: BloggerCourse.com  Fact Sheet for Healthcare Providers: SeriousBroker.it  This test is not yet approved or cleared by the [place] FDA and has been authorized for detection and/or diagnosis of SARS-CoV-2 by FDA under an Emergency Use Authorization (EUA). This EUA will remain in effect (meaning this test can be used) for the duration of the COVID-19 declaration under Section 564(b)(1) of the Act, 21 U.S.C. section 360bbb-3(b)(1), unless the authorization is terminated or revoked.  Performed at [facility], [address], [place], [place] [address]   Blood culture (routine x 2)     Status: None (Preliminary result)   Collection Time: 07/17/23  2:34 PM   Specimen: BLOOD RIGHT HAND  Result Value Ref Range Status   Specimen Description   Final    BLOOD RIGHT HAND BOTTLES DRAWN AEROBIC AND ANAEROBIC   Special Requests   Final    Blood Culture results may not be optimal due to an excessive volume of blood received in culture bottles   Culture   Final    NO GROWTH 2 DAYS Performed at  [facility], [address], [place], [place] [address]    Report Status PENDING  Incomplete  MRSA Next Gen by PCR, Nasal     Status: None   Collection Time: 07/18/23  7:00 AM   Specimen: Nasal Mucosa; Nasal Swab  Result Value Ref Range Status   MRSA by PCR Next Gen NOT DETECTED NOT DETECTED Final    Comment: (NOTE) The GeneXpert MRSA Assay (FDA approved for NASAL specimens only), is one component of a comprehensive MRSA colonization surveillance program. It is not intended to diagnose MRSA infection nor to guide or monitor treatment for MRSA infections. Test performance is not FDA approved in patients less than 62 years old. Performed at [facility], [address] [place], [place] [address]     Radiology Studies: [place]  CHEST (PLEURAL EFFUSION)  Result Date: 07/18/2023 CLINICAL DATA:  Bilateral pleural effusions. EXAM: CHEST ULTRASOUND COMPARISON:  CT scan of July 17, 2023. FINDINGS: Small bilateral pleural effusions are noted with associated atelectasis of both lungs. No adequate fluid pocket for thoracentesis is noted. IMPRESSION: Small bilateral pleural effusions with associated atelectasis of both lower lobes. No adequate fluid pocket for thoracentesis. Electronically Signed   By: Lupita Raider M.D.   On: 07/18/2023 11:32   CT CHEST WO CONTRAST  Result Date: 07/17/2023 CLINICAL DATA:  Worsening dyspnea, cough, recent treatment of pneumonia. History of non-small cell lung cancer of the left lung. Last chemotherapy on Monday. EXAM: CT CHEST WITHOUT CONTRAST TECHNIQUE: Multidetector CT imaging of the chest was performed following the standard protocol without IV contrast. RADIATION DOSE REDUCTION: This exam was performed according to the departmental dose-optimization program which includes automated exposure control, adjustment of the mA and/or kV according to patient size and/or use of iterative reconstruction technique. COMPARISON:  Chest radiograph earlier today and CTA chest  06/01/2023 FINDINGS: Cardiovascular: Normal heart size. Trace pericardial effusion. Coronary artery and aortic atherosclerotic calcification. Right chest wall port. Mediastinum/Nodes: Wispy secretions in the trachea extending into the right mainstem bronchus. Unremarkable esophagus. Unchanged mediastinal adenopathy. For example 1.3 cm AP window node on series 2/image 64 and 1.7 cm subcarinal node on 2/87. Lungs/Pleura: New moderate right pleural effusion and small left pleural effusion. Increased diffuse ground-glass and reticular opacities with architectural distortion and bronchiectasis/bronchiolectasis greatest in the lower lungs. Additional increased peribronchovascular consolidation in the bilateral lower lobes and right middle lobe. New thick walled cavitary lesion in the right middle lobe measuring 3.0 x 3.4 cm on series 4/image 92. Slightly increased size of the nodular mass in the posterior right lower lobe measuring 2.4 x 1.4 cm (series 4/image 120, previously 2.2 x 1.3 cm. This now demonstrates suggestion of early central cavitation. Additional cavitary lesions are similar or decreased. For example the cavitary lesion in the central left upper lobe on series 4/image 58 now measuring 1.3 cm in greatest dimension, previously 1.9 cm. The irregular spiculated nodule in the left lower lobe has decreased in size now measuring 3.7 cm in greatest dimension, previously 4.6 cm. Upper Abdomen: Cholelithiasis.  No acute abnormality. Musculoskeletal: No acute fracture or destructive osseous lesion. IMPRESSION: 1. Constellation of findings favored to represent worsening multifocal pneumonia superimposed on a background of interstitial lung disease. 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 2. There are a few new or increased cavitary lesions for example in the right middle lobe. Some of the previously seen cavitary lesions are stable or decreased in size. 3. Increased size of the  posterior right lower lobe nodule new central cavitation. Decreased size of the irregular spiculated nodule in the left lower lobe 4. New moderate right pleural effusion and small left pleural effusion. 5. Unchanged mediastinal adenopathy. Aortic Atherosclerosis (ICD10-I70.0). Electronically Signed   By: Minerva Fester M.D.   On: 07/17/2023 20:17    Scheduled Meds:  apixaban  5 mg Oral BID   arformoterol  15 mcg Nebulization BID   And   umeclidinium bromide  1 puff Inhalation Daily   Chlorhexidine Gluconate Cloth  6 each Topical Daily   fluticasone  2 spray Each Nare Daily   lactose free nutrition  237 mL Oral TID BM   methylPREDNISolone (SOLU-MEDROL) injection  40 mg Intravenous Daily   mirtazapine  7.5 mg Oral QHS   multivitamin with minerals  1 tablet Oral Daily  pantoprazole  40 mg Oral BID AC   Continuous Infusions:  ceFEPime (MAXIPIME) IV 2 g (07/19/23 1327)   metronidazole 500 mg (07/19/23 0824)   vancomycin Stopped (07/19/23 0049)    LOS: 2 days   Shon Hale M.D on 07/19/2023 at 2:37 PM  Go to [email or web address] - for contact info  [facility] - Office  [phone]  If 7PM-7AM, please contact night-coverage [email or web address] 07/19/2023, 2:37 PM

## 2023-07-19 NOTE — Plan of Care (Signed)
  Problem: Education: Goal: Knowledge of the prescribed therapeutic regimen will improve Outcome: Progressing   Problem: Clinical Measurements: Goal: Usual level of consciousness will be regained or maintained. Outcome: Progressing Goal: Neurologic status will improve Outcome: Progressing Goal: Ability to maintain intracranial pressure will improve Outcome: Progressing   Problem: Skin Integrity: Goal: Demonstration of wound healing without infection will improve Outcome: Progressing   Problem: Activity: Goal: Ability to tolerate increased activity will improve Outcome: Progressing   Problem: Respiratory: Goal: Ability to maintain adequate ventilation will improve Outcome: Not Progressing Goal: Ability to maintain a clear airway will improve Outcome: Not Progressing   Problem: Education: Goal: Knowledge of General Education information will improve Description: Including pain rating scale, medication(s)/side effects and non-pharmacologic comfort measures Outcome: Progressing   Problem: Health Behavior/Discharge Planning: Goal: Ability to manage health-related needs will improve Outcome: Progressing   Problem: Clinical Measurements: Goal: Ability to maintain clinical measurements within normal limits will improve Outcome: Progressing Goal: Will remain free from infection Outcome: Progressing Goal: Diagnostic test results will improve Outcome: Progressing Goal: Respiratory complications will improve Outcome: Progressing Goal: Cardiovascular complication will be avoided Outcome: Progressing   Problem: Activity: Goal: Risk for activity intolerance will decrease Outcome: Progressing   Problem: Nutrition: Goal: Adequate nutrition will be maintained Outcome: Progressing   Problem: Coping: Goal: Level of anxiety will decrease Outcome: Progressing   Problem: Elimination: Goal: Will not experience complications related to bowel motility Outcome: Progressing Goal:  Will not experience complications related to urinary retention Outcome: Progressing

## 2023-07-19 NOTE — Progress Notes (Signed)
Palliative:   Matthew Rivera is sitting up in bed.  He appears much improved from yesterday.  He greets me, making and somewhat keeping eye contact.  He is alert, able to make his needs known.  His oldest daughter, Matthew Rivera and his aunt Matthew Rivera are at bedside.  They have brought a cheeseburger which she is just starting to eat.  I share that I will return at a later time.  I returned later in the afternoon to find Matthew Rivera sitting up in bed with [name] and wife Matthew Rivera present.  We talk about his acute health concerns, his chemotherapy, his declines after chemo treatments.  We talked about Matthew Rivera most recent note and the treatment plan.  We talked about time for outcomes.  Matthew Rivera and his family state that they would like to speak with Matthew Rivera for direction of care.  He was to see trusted cancer doctor today but this appointment has been moved to October 16.    We talked about the benefits of steroids. We talked about quality of life.  I asked Matthew Rivera if he would be surprised if he did not make it to Christmas.  He states that he would be surprised if he did not make it to Christmas but then states on the other hand he would not be surprised.  We talked about CODE STATUS.  Matthew Rivera readily agrees that he would not want attempted resuscitation.    Conference with attending, bedside nursing staff, transition of care team related to patient condition, needs, goals of care, disposition.  Plan: At this point continue to treat the treatable but no CPR or intubation.  Time for outcomes.  Would like to have YN from trusted oncologist, Matthew Rivera.  50 minutes  Lillia Carmel, NP Palliative medicine team Team phone [phone]

## 2023-07-19 NOTE — Evaluation (Signed)
Clinical/Bedside Swallow Evaluation Patient Details  Name: Matthew Rivera MRN: [id number] Date of Birth: 1960-12-10  Today's Date: 07/19/2023 Time: SLP Start Time (ACUTE ONLY): 1240 SLP Stop Time (ACUTE ONLY): 1251 SLP Time Calculation (min) (ACUTE ONLY): 11 min  Past Medical History:  Past Medical History:  Diagnosis Date   Dyslipidemia    GERD (gastroesophageal reflux disease)    Lung nodule seen on imaging study 03/31/2023   seen on CT results   Osteoarthritis    Smoker    long term smoker 1 ppd   Past Surgical History:  Past Surgical History:  Procedure Laterality Date   BACK SURGERY     x 1   COLONOSCOPY WITH PROPOFOL N/A 08/18/2020   Procedure: COLONOSCOPY WITH PROPOFOL;  Surgeon: Lanelle Bal, DO;  Location: [facility];  Service: Endoscopy;  Laterality: N/A;  7:30am   CRANIOTOMY Right 04/07/2023   Procedure: Right suboccipital craniotomy for tumor resection;  Surgeon: Tressie Stalker, MD;  Location: [facility] OR;  Service: Neurosurgery;  Laterality: Right;  TO follow   IR IMAGING GUIDED PORT INSERTION  05/12/2023   MULTIPLE TOOTH EXTRACTIONS     wears full dentures   POLYPECTOMY  08/18/2020   Procedure: POLYPECTOMY INTESTINAL;  Surgeon: Lanelle Bal, DO;  Location: [facility];  Service: Endoscopy;;   HPI:  Matthew Rivera is a 62 y.o. man with with a past medical history of stage IV lung cancer with brain metastases who presents with worsening respiratory failure and shortness of breath.  He has had progressive clinical decline over the last few months.  He was diagnosed in June 2024.  Since then has had craniotomy and surgical resection of metastases as well as brain radiation and systemic chemotherapy.  His clinical course has been complicated by intolerance to chemotherapy, as well as multifocal pneumonia and acute pulmonary embolism for which she is now on Eliquis.  He was just discharged last week for healthcare associated pneumonia.  Unfortunately soon after coming  home he had worsening shortness of breath from his 3 L nasal cannula and needed more oxygen.  He presented to the ED.  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  Pulmonary has been consulted in the setting of worsening respiratory failure and to see if any other treatment options can be offered.  He is currently on broad-spectrum antibiotics with vancomycin and cefepime.    Assessment / Plan / Recommendation  Clinical Impression  Clinical swallowing evaluation completed while Pt was sitting upright in bed. Pt consumed all textures and consistencies without overt s/sx of oropharyngeal dysphagia. Pt denies dysphagia. SLP provided education to Pt and family at bedside that Pt is at risk for aspiration secondary to compromised respiratory status. Recommend continue with regular/thin diet with precautions re: slow rate, small bites/sips, minimize talking while eating. There are no further ST needs noted at this time, our service will sign off, thank you for this consult. SLP Visit Diagnosis: Dysphagia, oropharyngeal phase (R13.12)    Aspiration Risk  Mild aspiration risk    Diet Recommendation Regular;Thin liquid    Liquid Administration via: Cup;Straw Medication Administration: Whole meds with liquid Supervision: Patient able to self feed Compensations: Minimize environmental distractions;Slow rate Postural Changes: Seated upright at 90 degrees    Other  Recommendations Oral Care Recommendations: Oral care BID    Recommendations for follow up therapy are one component  of a multi-disciplinary discharge planning process, led by the attending physician.  Recommendations may be updated based on patient status, additional functional criteria and insurance authorization.  Follow up Recommendations No SLP follow up        Swallow  Study   General Date of Onset: 07/17/23 HPI: Matthew Rivera is a 62 y.o. man with with a past medical history of stage IV lung cancer with brain metastases who presents with worsening respiratory failure and shortness of breath.  He has had progressive clinical decline over the last few months.  He was diagnosed in June 2024.  Since then has had craniotomy and surgical resection of metastases as well as brain radiation and systemic chemotherapy.  His clinical course has been complicated by intolerance to chemotherapy, as well as multifocal pneumonia and acute pulmonary embolism for which she is now on Eliquis.  He was just discharged last week for healthcare associated pneumonia.  Unfortunately soon after coming home he had worsening shortness of breath from his 3 L nasal cannula and needed more oxygen.  He presented to the ED.  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  Pulmonary has been consulted in the setting of worsening respiratory failure and to see if any other treatment options can be offered.  He is currently on broad-spectrum antibiotics with vancomycin and cefepime. Type of Study: Bedside Swallow Evaluation Previous Swallow Assessment: none in chart Diet Prior to this Study: Regular;Thin liquids (Level 0) Temperature Spikes Noted: No Respiratory Status: Nasal cannula ([place] 10L/mn) History of Recent Intubation: No Behavior/Cognition: Alert;Cooperative Oral Cavity Assessment: Within Functional Limits Oral Care Completed by SLP: Recent completion by staff Oral Cavity - Dentition: Dentures, top;Dentures, bottom;Dentures, not available Vision: Functional for self-feeding Self-Feeding Abilities: Able to feed self Patient Positioning: Upright in bed Baseline Vocal Quality: Normal Volitional Cough:  Strong;Congested Volitional Swallow: Able to elicit    Oral/Motor/Sensory Function Overall Oral Motor/Sensory Function: Within functional limits   Ice Chips Ice chips: Within functional limits   Thin Liquid Thin Liquid: Within functional limits    Nectar Thick Nectar Thick Liquid: Not tested   Honey Thick Honey Thick Liquid: Not tested   Puree Puree: Not tested   Solid     Liat Mayol H. Romie Levee, CCC-SLP Speech Language Pathologist  Solid: Within functional limits      Georgetta Haber 07/19/2023,12:59 PM

## 2023-07-19 NOTE — Progress Notes (Addendum)
Initial Nutrition Assessment  DOCUMENTATION CODES:   Severe malnutrition in context of chronic illness  INTERVENTION:   Chocolate Boost Plus PO TID, each supplement provides 360 kcal and 14 gm protein (Son to bring in from home as patient does not like any other supplement). MVI with minerals daily.  NUTRITION DIAGNOSIS:   Severe Malnutrition related to chronic illness (lung cancer with brain mets) as evidenced by severe muscle depletion, percent weight loss (9% weight loss within 3 months).  GOAL:   Patient will meet greater than or equal to 90% of their needs  MONITOR:   PO intake, Supplement acceptance  REASON FOR ASSESSMENT:   Consult Assessment of nutrition requirement/status  ASSESSMENT:   62 yo male admitted with acute on chronic respiratory failure in the setting of recurrent PNA. PMH includes stage 4 lung cancer with brain mets, S/P XRT, chemo on hold d/t poor tolerance, smoker, GERD, HLD, pulmonary embolism.  S/P bedside swallow evaluation with SLP today. Recommendation for regular diet with thin liquids.  Patient reports eating poorly because he does not like the hospital food. Son brought him a home cooked meal and patient was ready to eat it. He had breakfast from [employer]. He only likes chocolate Boost Plus, which is not currently available at [facility], so son wants to bring it in from home.    Labs reviewed. Na 129, K 3.4 CBG: 144  Medications reviewed and include solumedrol, remeron, protonix.  Weight history reviewed. Patient with 9% weight loss within the past 3 months. Patient meets criteria for severe malnutrition, given severe depletion of muscle mass and severe weight loss.   NUTRITION - FOCUSED PHYSICAL EXAM:  Flowsheet Row Most Recent Value  Orbital Region Moderate depletion  Upper Arm Region Moderate depletion  Thoracic and Lumbar Region Moderate depletion  Buccal Region Moderate depletion  Temple Region Severe depletion   Clavicle Bone Region Severe depletion  Clavicle and Acromion Bone Region Moderate depletion  Scapular Bone Region Severe depletion  Dorsal Hand Moderate depletion  Patellar Region Moderate depletion  Anterior Thigh Region Moderate depletion  Posterior Calf Region Moderate depletion  Edema (RD Assessment) Moderate  Hair Reviewed  Eyes Reviewed  Mouth Reviewed  Skin Reviewed  Nails Reviewed       Diet Order:   Diet Order             Diet regular Fluid consistency: Thin  Diet effective now                   EDUCATION NEEDS:   Education needs have been addressed  Skin:  Skin Assessment: Skin Integrity Issues: Skin Integrity Issues:: Stage I Stage I: sacrum  Last BM:  10/6  Height:   Ht Readings from Last 1 Encounters:  07/17/23 5\' 11"  (1.803 m)    Weight:   Wt Readings from Last 1 Encounters:  07/19/23 61.2 kg    Ideal Body Weight:  78.2 kg  BMI:  Body mass index is 18.82 kg/m.  Estimated Nutritional Needs:   Kcal:  2000-2200  Protein:  95-110 gm  Fluid:  2-2.2 L   Gabriel Rainwater RD, LDN, CNSC Please refer to Amion for contact information.

## 2023-07-20 ENCOUNTER — Encounter: Payer: Self-pay | Admitting: Internal Medicine

## 2023-07-20 ENCOUNTER — Inpatient Hospital Stay (HOSPITAL_COMMUNITY): Payer: 59

## 2023-07-20 DIAGNOSIS — G9389 Other specified disorders of brain: Secondary | ICD-10-CM

## 2023-07-20 DIAGNOSIS — E876 Hypokalemia: Secondary | ICD-10-CM | POA: Diagnosis not present

## 2023-07-20 DIAGNOSIS — Z515 Encounter for palliative care: Secondary | ICD-10-CM | POA: Diagnosis not present

## 2023-07-20 DIAGNOSIS — E43 Unspecified severe protein-calorie malnutrition: Secondary | ICD-10-CM | POA: Diagnosis not present

## 2023-07-20 DIAGNOSIS — Z7189 Other specified counseling: Secondary | ICD-10-CM | POA: Diagnosis not present

## 2023-07-20 DIAGNOSIS — I2782 Chronic pulmonary embolism: Secondary | ICD-10-CM

## 2023-07-20 DIAGNOSIS — J9621 Acute and chronic respiratory failure with hypoxia: Secondary | ICD-10-CM | POA: Diagnosis not present

## 2023-07-20 LAB — GLUCOSE, CAPILLARY
Glucose-Capillary: 104 mg/dL — ABNORMAL HIGH (ref 70–99)
Glucose-Capillary: 111 mg/dL — ABNORMAL HIGH (ref 70–99)
Glucose-Capillary: 122 mg/dL — ABNORMAL HIGH (ref 70–99)
Glucose-Capillary: 89 mg/dL (ref 70–99)
Glucose-Capillary: 95 mg/dL (ref 70–99)

## 2023-07-20 MED ORDER — LORAZEPAM 2 MG/ML IJ SOLN
INTRAMUSCULAR | Status: AC
  Filled 2023-07-20: qty 1

## 2023-07-20 MED ORDER — LORAZEPAM 2 MG/ML IJ SOLN
0.5000 mg | INTRAMUSCULAR | Status: DC | PRN
  Administered 2023-07-20: 0.5 mg via INTRAVENOUS
  Filled 2023-07-20: qty 1

## 2023-07-20 MED ORDER — POTASSIUM CHLORIDE 10 MEQ/100ML IV SOLN
10.0000 meq | INTRAVENOUS | Status: AC
  Administered 2023-07-20 (×2): 10 meq via INTRAVENOUS
  Filled 2023-07-20 (×2): qty 100

## 2023-07-20 MED ORDER — FUROSEMIDE 10 MG/ML IJ SOLN
40.0000 mg | Freq: Once | INTRAMUSCULAR | Status: AC
  Administered 2023-07-20: 40 mg via INTRAVENOUS
  Filled 2023-07-20: qty 4

## 2023-07-20 MED ORDER — SCOPOLAMINE 1 MG/3DAYS TD PT72
1.0000 | MEDICATED_PATCH | TRANSDERMAL | Status: DC
  Administered 2023-07-20: 1.5 mg via TRANSDERMAL
  Filled 2023-07-20: qty 1

## 2023-07-20 MED ORDER — MAGNESIUM SULFATE IN D5W 1-5 GM/100ML-% IV SOLN
1.0000 g | Freq: Once | INTRAVENOUS | Status: AC
  Administered 2023-07-20: 1 g via INTRAVENOUS
  Filled 2023-07-20: qty 100

## 2023-07-20 MED ORDER — MORPHINE SULFATE (PF) 2 MG/ML IV SOLN
1.0000 mg | Freq: Four times a day (QID) | INTRAVENOUS | Status: DC | PRN
  Administered 2023-07-20 – 2023-07-22 (×4): 1 mg via INTRAVENOUS
  Filled 2023-07-20 (×5): qty 1

## 2023-07-20 MED ORDER — LORAZEPAM 2 MG/ML IJ SOLN
1.0000 mg | Freq: Once | INTRAMUSCULAR | Status: AC
  Administered 2023-07-20: 1 mg via INTRAVENOUS

## 2023-07-20 NOTE — Progress Notes (Signed)
In error

## 2023-07-20 NOTE — Progress Notes (Signed)
Pharmacy Antibiotic Note  Matthew Rivera is a 61 y.o. male admitted on 07/17/2023 with pneumonia. PMH significant for NSCLC with brain metastasis s/p craniotomy and radiation. Patient presented with increased shortness of breath and was recently discharged from a hospitalization for pneumonia on 07/11/23. In ED, patient is afebrile and tachycardic with no leukocytosis. Pharmacy has been consulted for vancomycin and cefepime dosing.  Plan: cefepime 2 g IV every 8 hours vancomycin 1250 mg IV every 24 hours- F/U de-escalation today Metronidazole per MD Monitor renal function, clinical status, culture data, and LOT  Height: 5\' 11"  (180.3 cm) Weight: 61.8 kg (136 lb 3.9 oz) IBW/kg (Calculated) : 75.3  Temp (24hrs), Avg:98.2 F (36.8 C), Min:98 F (36.7 C), Max:98.5 F (36.9 C)  Recent Labs  Lab 07/14/23 0304 07/14/23 1020 07/17/23 1419 07/18/23 0617 07/18/23 0623 07/18/23 0921 07/19/23 0820  WBC 3.9*  --  8.9 8.7  --   --  17.1*  CREATININE  --  0.76 0.99 0.86  --   --  0.88  LATICACIDVEN  --   --  1.7  --  1.4 1.5  --     Estimated Creatinine Clearance: 76.1 mL/min (by C-G formula based on SCr of 0.88 mg/dL).    No Known Allergies  Antimicrobials this admission: vancomycin 10/06 >>  cefepime 10/06 >>  Flagyl 10/6 >>  Microbiology results: 10/06 Bcx: ngtd 10/06 MRSA PCR: neg  Thank you for involving pharmacy in this patient's care.   Judeth Cornfield, PharmD Clinical Pharmacist 07/20/2023 7:49 AM

## 2023-07-20 NOTE — Plan of Care (Signed)
  Problem: Education: Goal: Knowledge of the prescribed therapeutic regimen will improve Outcome: Progressing   Problem: Respiratory: Goal: Ability to maintain a clear airway will improve Outcome: Progressing   Problem: Education: Goal: Knowledge of General Education information will improve Description: Including pain rating scale, medication(s)/side effects and non-pharmacologic comfort measures Outcome: Progressing   Problem: Health Behavior/Discharge Planning: Goal: Ability to manage health-related needs will improve Outcome: Progressing   Problem: Coping: Goal: Level of anxiety will decrease Outcome: Progressing   Problem: Elimination: Goal: Will not experience complications related to bowel motility Outcome: Progressing Goal: Will not experience complications related to urinary retention Outcome: Progressing   Problem: Pain Managment: Goal: General experience of comfort will improve Outcome: Progressing

## 2023-07-20 NOTE — Progress Notes (Addendum)
Palliative: Chart review completed.  Matthew Rivera seems to have decompensated overnight.  He is now requiring large amounts of oxygen.   Matthew Rivera is lying quietly in bed.  He appears acutely/chronically ill and very frail.  I do not attempt to wake him.  His wife, Matthew Rivera, is present at bedside.  We talk at length about his acute declines in the treatment plan.  Matthew Rivera shares that she spoke with the cancer navigator, Aundra Millet, who shared her belief that Matthew Rivera is not a candidate for any further chemotherapy.  Matthew Rivera is in agreement at this time he is too weak for any treatment.  We talk about Matthew Rivera's very good day yesterday, his visiting with friends and family, enjoying food.  We talk about a rally that is sometimes seen prior to end-of-life.  We talked about functional and respiratory decline.  I shared my concern that Matthew Rivera may be too weak to leave the hospital.  We talked about transitioning to comfort care, what this would look like and feel like.  We talked about time for outcomes.  Conference with attending, bedside nursing staff, transition of care team related to patient condition, needs, goals of care, disposition.  Plan: At this point continue to treat the treatable but no CPR or intubation.  Time for outcomes.  Sudden in-hospital death would not be surprising.  50 minutes  Lillia Carmel, NP Palliative medicine team Team phone [phone]

## 2023-07-20 NOTE — Progress Notes (Signed)
Responded to patient's alarm on HR up to 150-160s, found patient trying to get out of bed to use the bathroom. Saturations down to 70s. RN increased oxygen rate to 15L, encouraging patient to take deep breaths in through his nose. Patient very anxious, pulling off the high flow nasal cannula and still trying to get up on his own. RRT called to bedside. MD on call notified and received new order to give solu-medrol early. Staff gave ativan for anxiety and zofran for patient's endorsement of nausea. MD made aware of vital signs, including persistent HR in the 150s-per MD, to continue monitoring with no interventions for heart rate. Patient more calmer at this time.

## 2023-07-20 NOTE — Progress Notes (Signed)
PROGRESS NOTE  Matthew Rivera, is a 62 y.o. male, DOB - April 03, 1961, ZOX:[id number]  Admit date - 07/17/2023   Admitting Physician Onnie Boer, MD  Outpatient Primary MD for the patient is [name], MD  LOS - 3  Chief Complaint  Patient presents with   Shortness of Breath      Brief Narrative:  62 y.o. male with medical history significant for Stage IV non-small cell lung cancer with brain mets, status post craniotomy and surgical resection on April 07, 2023 under the care of [name], s/p Radiation to the brain cavity by Dr. Carlisle Beers currently on hold due to patient's poor tolerance of chemo despite reduced dose chemo , H/o pulmonary embolism (History of Septic Emboli August 2024) on anticoagulation admitted on 07/17/2023 with acute on chronic hypoxic respiratory failure in the setting of recurrent healthcare associated pneumonia    -Assessment and Plan: 1)Acute on Chronic Respiratory Failure with hypoxia (HCC) -Prior to admission patient was on 4 L of oxygen via nasal cannula -Currently requiring 9 to 10 L of oxygen via nasal cannula -Follow-up CT chest findings- (See detailed report) Constellation of findings favored to represent worsening multifocal pneumonia superimposed on a background of interstitial lung disease, few new or increased cavitary lesions for example in the right middle lobe. New moderate right pleural effusion and small left pleural effusion -Continue IV cefepime, Flagyl and vancomycin for multifocal pneumonia -Chest ultrasound on 07/18/2023 without adequate fluid pocket for thoracentesis -Continue bronchodilators =-Pulmonology consult appreciated -Continue Solu-Medrol and current antibiotics -Patient is less dyspneic at rest, becomes very dyspneic with change in position in bed or attempts to lean forward for exam -We have remained unable to wean oxygen down due to desaturation and this morning has ended experiencing increased work of breathing  with up to 15 L supplementation requirement.  2)HCAP (healthcare-associated pneumonia) 2 recent hospitalizations initially for multifocal pneumonia treated with ceftriazone and azitho, and subsequently admitted for HCAP- Treated with IV cefepime and vancomycin, discharged 10/3 to complete 4 more days of Augmentin which he was compliant with.   ---COVID test negative - Procalcitonin elevated, but in the setting of lung cancer,? 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 -Continue to wean off oxygen supplementation as tolerated.  3)Hypokalemia/Hyponatremia -Suspect some degree of SIADH in the setting of underlying lung cancer and pneumonia  -Avoid excessive free water, nutritional supplements advised -Avoid dehydration and Replace potassium  4)H/o pulmonary embolism (History of Septic Emboli August 2024) on anticoagulation  -continue Eliquis  5)Stage IV non-small cell lung cancer with brain mets--- status post craniotomy and surgical resection on April 07, 2023 under the care of [name], s/p Radiation to the brain cavity by Dr. Carlisle Beers currently on hold due to patient's poor tolerance of chemo despite reduced dose chemo and recurrent infection Follows with Dr. Arbutus Ped, ?? Keytruda alone in the future. -Continue outpatient follow-up with oncology service.  6)Rash -Diffuse rash may be related to Chemo. -Continue to follow clinical response. -No petechiae appreciated.  7) chronic anemia and thrombocytopenia--due to underlying malignancy and chemotherapy -Hgb currently above 11, platelets currently above 141 no bleeding concerns, monitor closely while on Eliquis -Hgb and platelets appear to be trending up -Transfuse as clinically  indicated  8)Social/Ethics--- plan of care and advanced directive discussed with patient, his daughter [name] , son [name] and his  wife [name] by Dr. Mariea Clonts. -Appreciate assistance and further recommendations by palliative care service. --Patient is now a DNR/DNI  9)Moderate protein caloric malnutrition/anorexia--Remeron for appetite stimulation -Nutritional supplements requested -Dietitian consult and speech pathologist consult appreciated  10)Leukocytosis--- WBC trending up since starting Solu-Medrol suspect this is partly steroid-induced leukocytosis -Continue antibiotics as above #1 pending culture data. -Continue to follow culture results.   Status is: Inpatient   Disposition: The patient is from: Home              Anticipated d/c is to: SNF              Anticipated d/c date is: > 3 days              Patient currently is not medically stable to d/c.  Barriers: Not Clinically Stable-   Code Status :  -  Code Status: Limited: Do not attempt resuscitation (DNR) -DNR-LIMITED -Do Not Intubate/DNI    Family Communication:   (patient is alert, awake and coherent)  Discussed with daughter [name], son [name] and wife [name] at bedside  DVT Prophylaxis  :   - SCDs   Place and maintain sequential compression device Start: 07/17/23 2331 apixaban (ELIQUIS) tablet 5 mg   Lab Results  Component Value Date   PLT 141 (L) 07/19/2023   Inpatient Medications  Scheduled Meds:  apixaban  5 mg Oral BID   arformoterol  15 mcg Nebulization BID   And   umeclidinium bromide  1 puff Inhalation Daily   Chlorhexidine Gluconate Cloth  6 each Topical Daily   fluticasone  2 spray Each Nare Daily   lactose free nutrition  237 mL Oral TID BM   methylPREDNISolone (SOLU-MEDROL) injection  40 mg Intravenous Daily   mirtazapine  7.5 mg Oral QHS   multivitamin with minerals  1 tablet Oral Daily   pantoprazole  40 mg Oral BID AC   Continuous Infusions:  ceFEPime (MAXIPIME) IV Stopped (07/20/23 0554)    metronidazole 500 mg (07/20/23 0834)   potassium chloride 10 mEq (07/20/23 1118)   vancomycin 1,250 mg (07/19/23 2147)   PRN Meds:.acetaminophen **OR** acetaminophen, albuterol, morphine injection, ondansetron **OR** ondansetron (ZOFRAN) IV, oxyCODONE, polyethylene glycol, tamsulosin, traZODone   Anti-infectives (From admission, onward)    Start     Dose/Rate Route Frequency Ordered Stop   07/18/23 2200  vancomycin (VANCOREADY) IVPB 1250 mg/250 mL        1,250 mg 166.7 mL/hr over 90 Minutes Intravenous Every 24 hours 07/17/23 2137     07/18/23 0600  ceFEPIme (MAXIPIME) 2 g in sodium chloride 0.9 % 100 mL IVPB        2 g 200 mL/hr over 30 Minutes Intravenous Every 8 hours 07/17/23 2137     07/17/23 2130  metroNIDAZOLE (FLAGYL) IVPB 500 mg        500 mg 100 mL/hr over 60 Minutes Intravenous Every 12 hours 07/17/23 2124     07/17/23 2130  vancomycin (VANCOREADY) IVPB 1250 mg/250 mL        1,250 mg 166.7 mL/hr over 90 Minutes Intravenous  Once 07/17/23 2127 07/18/23 0800   07/17/23 2130  ceFEPIme (MAXIPIME) 2 g in sodium chloride 0.9 % 100 mL IVPB        2 g 200 mL/hr over 30 Minutes Intravenous  Once 07/17/23 2127 07/18/23 0759      Subjective: Veto Kemps afebrile; no nausea or vomiting.  Patient reports back pain.  Increased work of breathing appreciated requiring up to 15  L high flow nasal cannula supplementation.  He is very restless.  Decreased oral intake reported.  Objective: Vitals:   07/20/23 0800 07/20/23 0900 07/20/23 1000 07/20/23 1123  BP:   (!) 92/52 136/88  Pulse: (!) 152 (!) 140 (!) 134 (!) 134  Resp: (!) 32 (!) 29 (!) 21 (!) 28  Temp:      TempSrc:      SpO2: 93% 96% 98% 96%  Weight:      Height:        Intake/Output Summary (Last 24 hours) at 07/20/2023 1142 Last data filed at 07/20/2023 0700 Gross per 24 hour  Intake 1178.44 ml  Output 800 ml  Net 378.44 ml   Filed Weights   07/17/23 1412 07/19/23 0500 07/20/23 0500  Weight: 62.1 kg 61.2 kg 61.8 kg    Physical Exam General exam: Alert, awake, restless, frail and chronically ill in appearance. Respiratory system: Positive tachypnea, diminished breath sounds at the bases, positive scattered rhonchi appreciated.  Requiring 15 L nasal cannula supplementation (high flow). Cardiovascular system: Sinus tachycardia, no rubs, no gallops, no JVD. Gastrointestinal system: Abdomen is nondistended, soft and nontender. No organomegaly or masses felt. Normal bowel sounds heard. Central nervous system: No focal neurological deficits. Extremities: No cyanosis or clubbing. Skin: No petechiae; right chest Port-A-Cath in place. Psychiatry: Restless at time of examination.  Data Reviewed: I have personally reviewed following labs and imaging studies  CBC: Recent Labs  Lab 07/14/23 0304 07/17/23 1419 07/18/23 0617 07/19/23 0820  WBC 3.9* 8.9 8.7 17.1*  NEUTROABS 3.1 6.8  --   --   HGB 9.1* 11.0* 9.9* 11.1*  HCT 27.3* 32.4* 28.8* 33.7*  MCV 89.2 87.3 87.0 88.9  PLT 124* 134* 109* 141*   Basic Metabolic Panel: Recent Labs  Lab 07/14/23 1020 07/17/23 1419 07/18/23 0617 07/19/23 0820  NA 132* 128* 127* 129*  K 3.2* 3.3* 4.0 3.4*  CL 100 93* 95* 96*  CO2 21* 23 25 23   GLUCOSE 96 97 108* 143*  BUN 10 14 12 17   CREATININE 0.76 0.99 0.86 0.88  CALCIUM 7.7* 7.8* 7.4* 7.4*  MG 1.4*  --   --   --   PHOS 2.2*  --   --  2.9   GFR: Estimated Creatinine Clearance: 76.1 mL/min (by C-G formula based on SCr of 0.88 mg/dL).  Liver Function Tests: Recent Labs  Lab 07/14/23 1020 07/19/23 0820  AST 18  --   ALT 21  --   ALKPHOS 124  --   BILITOT 0.8  --   PROT 5.2*  --   ALBUMIN 1.8* 1.7*   Recent Results (from the past 240 hour(s))  Culture, blood (routine x 2)     Status: None   Collection Time: 07/11/23  2:00 PM   Specimen: BLOOD  Result Value Ref Range Status   Specimen Description BLOOD RIGHT ANTECUBITAL  Final   Special Requests   Final    BOTTLES DRAWN AEROBIC AND ANAEROBIC Blood  Culture adequate volume   Culture   Final    NO GROWTH 5 DAYS Performed at [facility], [address], [place], [place] [address]    Report Status 07/16/2023 FINAL  Final  Culture, blood (routine x 2)     Status: Abnormal   Collection Time: 07/11/23  2:37 PM   Specimen: BLOOD  Result Value Ref Range Status   Specimen Description   Final    BLOOD BLOOD LEFT ARM Performed at [facility],  [address] [place], [place] [address]    Special Requests   Final    BOTTLES DRAWN AEROBIC AND ANAEROBIC Blood Culture adequate volume   Culture  Setup Time   Final    GRAM POSITIVE COCCI IN CLUSTERS BOTTLES DRAWN AEROBIC ONLY CRITICAL RESULT CALLED TO, READ BACK BY AND VERIFIED WITH: M [email or web address]  07/13/23 MK    Culture (A)  Final    MICROCOCCUS SPECIES MICROCOCCUS LUTEUS Standardized susceptibility testing for this organism is not available. Performed at [facility], [address], [place], [place] [address]    Report Status 07/13/2023 FINAL  Final  Blood Culture ID Panel (Reflexed)     Status: None   Collection Time: 07/11/23  2:37 PM  Result Value Ref Range Status   Enterococcus faecalis NOT DETECTED NOT DETECTED Final   Enterococcus Faecium NOT DETECTED NOT DETECTED Final   Listeria monocytogenes NOT DETECTED NOT DETECTED Final   Staphylococcus species NOT DETECTED NOT DETECTED Final   Staphylococcus aureus (BCID) NOT DETECTED NOT DETECTED Final   Staphylococcus epidermidis NOT DETECTED NOT DETECTED Final   Staphylococcus lugdunensis NOT DETECTED NOT DETECTED Final   Streptococcus species NOT DETECTED NOT DETECTED Final   Streptococcus agalactiae NOT DETECTED NOT DETECTED Final   Streptococcus pneumoniae NOT DETECTED NOT DETECTED Final   Streptococcus pyogenes NOT DETECTED NOT DETECTED Final   A.calcoaceticus-baumannii NOT DETECTED NOT DETECTED Final   Bacteroides fragilis NOT DETECTED NOT DETECTED Final   Enterobacterales NOT DETECTED  NOT DETECTED Final   Enterobacter cloacae complex NOT DETECTED NOT DETECTED Final   Escherichia coli NOT DETECTED NOT DETECTED Final   Klebsiella aerogenes NOT DETECTED NOT DETECTED Final   Klebsiella oxytoca NOT DETECTED NOT DETECTED Final   Klebsiella pneumoniae NOT DETECTED NOT DETECTED Final   Proteus species NOT DETECTED NOT DETECTED Final   Salmonella species NOT DETECTED NOT DETECTED Final   Serratia marcescens NOT DETECTED NOT DETECTED Final   Haemophilus influenzae NOT DETECTED NOT DETECTED Final   Neisseria meningitidis NOT DETECTED NOT DETECTED Final   Pseudomonas aeruginosa NOT DETECTED NOT DETECTED Final   Stenotrophomonas maltophilia NOT DETECTED NOT DETECTED Final   Candida albicans NOT DETECTED NOT DETECTED Final   Candida auris NOT DETECTED NOT DETECTED Final   Candida glabrata NOT DETECTED NOT DETECTED Final   Candida krusei NOT DETECTED NOT DETECTED Final   Candida parapsilosis NOT DETECTED NOT DETECTED Final   Candida tropicalis NOT DETECTED NOT DETECTED Final   Cryptococcus neoformans/gattii NOT DETECTED NOT DETECTED Final    Comment: Performed at [facility], [address] [place], [place] [address]  Resp panel by RT-PCR (RSV, Flu A&B, Covid) Anterior Nasal Swab     Status: None   Collection Time: 07/12/23  8:35 AM   Specimen: Anterior Nasal Swab  Result Value Ref Range Status   SARS Coronavirus 2 by RT PCR NEGATIVE NEGATIVE Final    Comment: (NOTE) SARS-CoV-2 target nucleic acids are NOT DETECTED.  The SARS-CoV-2 RNA is generally detectable in upper respiratory specimens during the acute phase of infection. The lowest concentration of SARS-CoV-2 viral copies this assay can detect is 138 copies/mL. A negative result does not preclude SARS-Cov-2 infection and should not be used as the sole basis for treatment or other patient management decisions. A negative result may occur with  improper specimen collection/handling, submission of specimen  other than nasopharyngeal swab, presence of viral mutation(s) within the areas targeted by this assay, and inadequate number of viral copies(<138 copies/mL).  A negative result must be combined with clinical observations, patient history, and epidemiological information. The expected result is Negative.  Fact Sheet for Patients:  BloggerCourse.com  Fact Sheet for Healthcare Providers:  SeriousBroker.it  This test is no t yet approved or cleared by the [place] FDA and  has been authorized for detection and/or diagnosis of SARS-CoV-2 by FDA under an Emergency Use Authorization (EUA). This EUA will remain  in effect (meaning this test can be used) for the duration of the COVID-19 declaration under Section 564(b)(1) of the Act, 21 U.S.C.section 360bbb-3(b)(1), unless the authorization is terminated  or revoked sooner.       Influenza A by PCR NEGATIVE NEGATIVE Final   Influenza B by PCR NEGATIVE NEGATIVE Final    Comment: (NOTE) The Xpert Xpress SARS-CoV-2/FLU/RSV plus assay is intended as an aid in the diagnosis of influenza from Nasopharyngeal swab specimens and should not be used as a sole basis for treatment. Nasal washings and aspirates are unacceptable for Xpert Xpress SARS-CoV-2/FLU/RSV testing.  Fact Sheet for Patients: BloggerCourse.com  Fact Sheet for Healthcare Providers: SeriousBroker.it  This test is not yet approved or cleared by the [place] FDA and has been authorized for detection and/or diagnosis of SARS-CoV-2 by FDA under an Emergency Use Authorization (EUA). This EUA will remain in effect (meaning this test can be used) for the duration of the COVID-19 declaration under Section 564(b)(1) of the Act, 21 U.S.C. section 360bbb-3(b)(1), unless the authorization is terminated or revoked.     Resp Syncytial Virus by PCR NEGATIVE NEGATIVE Final     Comment: (NOTE) Fact Sheet for Patients: BloggerCourse.com  Fact Sheet for Healthcare Providers: SeriousBroker.it  This test is not yet approved or cleared by the [place] FDA and has been authorized for detection and/or diagnosis of SARS-CoV-2 by FDA under an Emergency Use Authorization (EUA). This EUA will remain in effect (meaning this test can be used) for the duration of the COVID-19 declaration under Section 564(b)(1) of the Act, 21 U.S.C. section 360bbb-3(b)(1), unless the authorization is terminated or revoked.  Performed at [facility], [address] [place], [place] [address]   Urine Culture (for pregnant, neutropenic or urologic patients or patients with an indwelling urinary catheter)     Status: None   Collection Time: 07/12/23 10:01 AM   Specimen: Urine, Clean Catch  Result Value Ref Range Status   Specimen Description   Final    URINE, CLEAN CATCH Performed at [facility], [address], [place], [place] [address]    Special Requests   Final    NONE Performed at [facility], [address], [place], [place] [address]    Culture   Final    NO GROWTH Performed at [facility], [address] [place], [place] [address]    Report Status 07/13/2023 FINAL  Final  Expectorated Sputum Assessment w Gram Stain, Rflx to Resp Cult     Status: None   Collection Time: 07/12/23 10:37 AM   Specimen: Expectorated Sputum  Result Value Ref Range Status   Specimen Description EXPECTORATED SPUTUM  Final   Special Requests NONE  Final   Sputum evaluation   Final    Sputum specimen not acceptable for testing.  Please recollect.   Alease Medina I RN @ 1141 ON 07/12/23 CAL Performed at [facility], [address], [place], [place] [address]    Report Status 07/12/2023 FINAL  Final  MRSA Next Gen  by PCR, Nasal     Status: None   Collection  Time: 07/13/23 11:25 AM   Specimen: Nasal Mucosa; Nasal Swab  Result Value Ref Range Status   MRSA by PCR Next Gen NOT DETECTED NOT DETECTED Final    Comment: (NOTE) The GeneXpert MRSA Assay (FDA approved for NASAL specimens only), is one component of a comprehensive MRSA colonization surveillance program. It is not intended to diagnose MRSA infection nor to guide or monitor treatment for MRSA infections. Test performance is not FDA approved in patients less than 9 years old. Performed at [facility], [address], [place], [place] [address]   Culture, blood (Routine X 2) w Reflex to ID Panel     Status: None   Collection Time: 07/13/23  3:58 PM   Specimen: BLOOD LEFT ARM  Result Value Ref Range Status   Specimen Description   Final    BLOOD LEFT ARM Performed at [facility], [address], [place], [place] [address]    Special Requests   Final    BOTTLES DRAWN AEROBIC AND ANAEROBIC Blood Culture adequate volume Performed at [facility], [address], [place], [place] [address]    Culture   Final    NO GROWTH 5 DAYS Performed at [facility], [address], [place], [place] [address]    Report Status 07/18/2023 FINAL  Final  Culture, blood (Routine X 2) w Reflex to ID Panel     Status: None   Collection Time: 07/13/23  4:03 PM   Specimen: BLOOD RIGHT ARM  Result Value Ref Range Status   Specimen Description   Final    BLOOD RIGHT ARM Performed at [facility], [address], [place], [place] [address]    Special Requests   Final    BOTTLES DRAWN AEROBIC AND ANAEROBIC Blood Culture adequate volume Performed at [facility], [address] [place], [place] [address]    Culture   Final    NO GROWTH 5 DAYS Performed at [facility], [address] [place], [place] [address]    Report Status 07/18/2023 FINAL  Final  Blood culture (routine x 2)     Status: None (Preliminary result)    Collection Time: 07/17/23  2:19 PM   Specimen: Right Antecubital; Blood  Result Value Ref Range Status   Specimen Description   Final    RIGHT ANTECUBITAL BOTTLES DRAWN AEROBIC AND ANAEROBIC   Special Requests Blood Culture adequate volume  Final   Culture   Final    NO GROWTH 3 DAYS Performed at [facility], [address], [place], [place] [address]    Report Status PENDING  Incomplete  Resp panel by RT-PCR (RSV, Flu A&B, Covid) Anterior Nasal Swab     Status: None   Collection Time: 07/17/23  2:19 PM   Specimen: Anterior Nasal Swab  Result Value Ref Range Status   SARS Coronavirus 2 by RT PCR NEGATIVE NEGATIVE Final    Comment: (NOTE) SARS-CoV-2 target nucleic acids are NOT DETECTED.  The SARS-CoV-2 RNA is generally detectable in upper respiratory specimens during the acute phase of infection. The lowest concentration of SARS-CoV-2 viral copies this assay can detect is 138 copies/mL. A negative result does not preclude SARS-Cov-2 infection and should not be used as the sole basis for treatment or other patient management decisions. A negative result may occur with  improper specimen collection/handling, submission of specimen other than  nasopharyngeal swab, presence of viral mutation(s) within the areas targeted by this assay, and inadequate number of viral copies(<138 copies/mL). A negative result must be combined with clinical observations, patient history, and epidemiological information. The expected result is Negative.  Fact Sheet for Patients:  BloggerCourse.com  Fact Sheet for Healthcare Providers:  SeriousBroker.it  This test is no t yet approved or cleared by the [place] FDA and  has been authorized for detection and/or diagnosis of SARS-CoV-2 by FDA under an Emergency Use Authorization (EUA). This EUA will remain  in effect (meaning this test can be used) for the duration of the COVID-19 declaration under  Section 564(b)(1) of the Act, 21 U.S.C.section 360bbb-3(b)(1), unless the authorization is terminated  or revoked sooner.       Influenza A by PCR NEGATIVE NEGATIVE Final   Influenza B by PCR NEGATIVE NEGATIVE Final    Comment: (NOTE) The Xpert Xpress SARS-CoV-2/FLU/RSV plus assay is intended as an aid in the diagnosis of influenza from Nasopharyngeal swab specimens and should not be used as a sole basis for treatment. Nasal washings and aspirates are unacceptable for Xpert Xpress SARS-CoV-2/FLU/RSV testing.  Fact Sheet for Patients: BloggerCourse.com  Fact Sheet for Healthcare Providers: SeriousBroker.it  This test is not yet approved or cleared by the [place] FDA and has been authorized for detection and/or diagnosis of SARS-CoV-2 by FDA under an Emergency Use Authorization (EUA). This EUA will remain in effect (meaning this test can be used) for the duration of the COVID-19 declaration under Section 564(b)(1) of the Act, 21 U.S.C. section 360bbb-3(b)(1), unless the authorization is terminated or revoked.     Resp Syncytial Virus by PCR NEGATIVE NEGATIVE Final    Comment: (NOTE) Fact Sheet for Patients: BloggerCourse.com  Fact Sheet for Healthcare Providers: SeriousBroker.it  This test is not yet approved or cleared by the [place] FDA and has been authorized for detection and/or diagnosis of SARS-CoV-2 by FDA under an Emergency Use Authorization (EUA). This EUA will remain in effect (meaning this test can be used) for the duration of the COVID-19 declaration under Section 564(b)(1) of the Act, 21 U.S.C. section 360bbb-3(b)(1), unless the authorization is terminated or revoked.  Performed at [facility], [address], [place], [place] [address]   Blood culture (routine x 2)     Status: None (Preliminary result)   Collection Time: 07/17/23  2:34 PM    Specimen: BLOOD RIGHT HAND  Result Value Ref Range Status   Specimen Description   Final    BLOOD RIGHT HAND BOTTLES DRAWN AEROBIC AND ANAEROBIC   Special Requests   Final    Blood Culture results may not be optimal due to an excessive volume of blood received in culture bottles   Culture   Final    NO GROWTH 3 DAYS Performed at [facility], [address] [place], [place] [address]    Report Status PENDING  Incomplete  MRSA Next Gen by PCR, Nasal     Status: None   Collection Time: 07/18/23  7:00 AM   Specimen: Nasal Mucosa; Nasal Swab  Result Value Ref Range Status   MRSA by PCR Next Gen NOT DETECTED NOT DETECTED Final    Comment: (NOTE) The GeneXpert MRSA Assay (FDA approved for NASAL specimens only), is one component of a comprehensive MRSA colonization surveillance program. It is not intended to diagnose MRSA infection nor to guide or monitor treatment for MRSA infections. Test performance is not FDA approved in patients less than 2 years  old. Performed at [facility], [address], [place], [place] [id number]     Radiology Studies: DG CHEST PORT 1 VIEW  Result Date: 07/20/2023 CLINICAL DATA:  SOB (shortness of breath) EXAM: PORTABLE CHEST 1 VIEW COMPARISON:  CT of the chest and chest radiograph October 6, 24. FINDINGS: No substantial change in bilateral interstitial and airspace opacities. No visible pneumothorax. Cardiomediastinal silhouette is unchanged. Stable location of a right IJ approach Port-A-Cath. No evidence of acute bony abnormality. IMPRESSION: No substantial change in bilateral interstitial and airspace opacities, further characterized on recent CT chest. Electronically Signed   By: Feliberto Harts M.D.   On: 07/20/2023 11:31    Scheduled Meds:  apixaban  5 mg Oral BID   arformoterol  15 mcg Nebulization BID   And   umeclidinium bromide  1 puff Inhalation Daily   Chlorhexidine Gluconate Cloth  6 each Topical Daily   fluticasone  2 spray Each Nare Daily    lactose free nutrition  237 mL Oral TID BM   methylPREDNISolone (SOLU-MEDROL) injection  40 mg Intravenous Daily   mirtazapine  7.5 mg Oral QHS   multivitamin with minerals  1 tablet Oral Daily   pantoprazole  40 mg Oral BID AC   Continuous Infusions:  ceFEPime (MAXIPIME) IV Stopped (07/20/23 0554)   metronidazole 500 mg (07/20/23 0834)   potassium chloride 10 mEq (07/20/23 1118)   vancomycin 1,250 mg (07/19/23 2147)    LOS: 3 days   Vassie Loll M.D on 07/20/2023 at 11:42 AM  Go to [email or web address] - for contact info  [facility] - Office  [phone]  If 7PM-7AM, please contact night-coverage [email or web address] 07/20/2023, 11:42 AM

## 2023-07-20 NOTE — Progress Notes (Signed)
I returned a call to pt's wife, Matthew Rivera, who left a VM for me yesterday evening. The VM was from [facility] requesting that she and the pt see or hear from Dr.Mohamed about the next steps in his treatment as he is currently admitted. I explained to Matthew Rivera that Dr Arbutus Ped is out of the office today, but based on his note from pt's admission on 9/30 that it's likely he'll drop the chemotherapy and continue with the immunotherapy alone. I also reminded Matthew Rivera that the pt has a lab and f/u appt on 10/16 with Dr Arbutus Ped so any changes to his treatment plan can be discussed then. Kim verbalized understanding and agreement with the current plan in place.

## 2023-07-20 NOTE — Progress Notes (Signed)
RT called around [phone] for patient becoming distressed and upon arriving in the room, he was sitting on the side of the bed attempting to use the bathroom. RN had turned his HFNC up to 15L and he was very SOB. RN moved him to the bedside bathroom and he received a HHN with Albuterol with the 15L HFNC. Patient was given medication (Ativan) for anxiety. He was returned to bed and finished his HHN. His HR is in the 150s and RR are the upper 30s. Patient has c/o of nausea and has attempted to cough up secretions.

## 2023-07-21 ENCOUNTER — Encounter: Payer: Self-pay | Admitting: *Deleted

## 2023-07-21 DIAGNOSIS — G9389 Other specified disorders of brain: Secondary | ICD-10-CM | POA: Diagnosis not present

## 2023-07-21 DIAGNOSIS — E43 Unspecified severe protein-calorie malnutrition: Secondary | ICD-10-CM | POA: Diagnosis not present

## 2023-07-21 DIAGNOSIS — Z515 Encounter for palliative care: Secondary | ICD-10-CM | POA: Diagnosis not present

## 2023-07-21 DIAGNOSIS — Z7189 Other specified counseling: Secondary | ICD-10-CM | POA: Diagnosis not present

## 2023-07-21 DIAGNOSIS — E876 Hypokalemia: Secondary | ICD-10-CM | POA: Diagnosis not present

## 2023-07-21 DIAGNOSIS — J9621 Acute and chronic respiratory failure with hypoxia: Secondary | ICD-10-CM | POA: Diagnosis not present

## 2023-07-21 LAB — BASIC METABOLIC PANEL
Anion gap: 7 (ref 5–15)
BUN: 27 mg/dL — ABNORMAL HIGH (ref 8–23)
CO2: 22 mmol/L (ref 22–32)
Calcium: 7.4 mg/dL — ABNORMAL LOW (ref 8.9–10.3)
Chloride: 102 mmol/L (ref 98–111)
Creatinine, Ser: 1 mg/dL (ref 0.61–1.24)
GFR, Estimated: >60 mL/min (ref >60)
Glucose, Bld: 90 mg/dL (ref 70–99)
Potassium: 3.4 mmol/L — ABNORMAL LOW (ref 3.5–5.1)
Sodium: 131 mmol/L — ABNORMAL LOW (ref 135–145)

## 2023-07-21 LAB — GLUCOSE, CAPILLARY: Glucose-Capillary: 151 mg/dL — ABNORMAL HIGH (ref 70–99)

## 2023-07-21 MED ORDER — ORAL CARE MOUTH RINSE: 15.0000 mL | OROMUCOSAL | Status: DC | PRN

## 2023-07-21 MED ORDER — ZINC OXIDE 40 % EX OINT
TOPICAL_OINTMENT | Freq: Three times a day (TID) | CUTANEOUS | Status: DC
  Filled 2023-07-21: qty 57

## 2023-07-21 MED ORDER — POLYVINYL ALCOHOL 1.4 % OP SOLN
1.0000 [drp] | OPHTHALMIC | Status: DC | PRN
  Administered 2023-07-23: 1 [drp] via OPHTHALMIC
  Filled 2023-07-21: qty 15

## 2023-07-21 MED ORDER — HYDROXYZINE HCL 10 MG PO TABS
10.0000 mg | ORAL_TABLET | Freq: Three times a day (TID) | ORAL | Status: DC | PRN
  Administered 2023-07-21 – 2023-07-24 (×5): 10 mg via ORAL
  Filled 2023-07-21 (×5): qty 1

## 2023-07-21 NOTE — TOC Progression Note (Signed)
Transition of Care [facility]) - Progression Note    Patient Details  Name: Matthew Rivera MRN: [id number] Date of Birth: 09/19/1961  Transition of Care [facility]) CM/SW Contact  Erin Sons, [place] Phone Number: 07/21/2023, 3:21 PM  Clinical Narrative:     TOC continuing to follow.    Barriers to Discharge: Continued Medical Work up  Expected Discharge Plan and Services                                               Social Determinants of Health (SDOH) Interventions SDOH Screenings   Food Insecurity: No Food Insecurity (07/17/2023)  Housing: Low Risk  (07/17/2023)  Transportation Needs: No Transportation Needs (07/17/2023)  Utilities: Not At Risk (07/17/2023)  Alcohol Screen: Low Risk  (05/03/2023)  Depression (PHQ2-9): Low Risk  (05/02/2023)  Tobacco Use: High Risk (07/18/2023)    Readmission Risk Interventions    07/18/2023   12:26 PM 07/12/2023    1:54 PM 06/03/2023    1:54 PM  Readmission Risk Prevention Plan  Transportation Screening Complete Complete Complete  PCP or Specialist Appt within 3-5 Days  Complete Complete  HRI or Home Care Consult  Complete Complete  Social Work Consult for Recovery Care Planning/Counseling  Complete Complete  Palliative Care Screening  Not Applicable Not Complete  Palliative Care Screening Not Complete Comments   MD has orderes palliative for GOC  Medication Review [employer]) Complete Complete Complete  HRI or Home Care Consult Complete    SW Recovery Care/Counseling Consult Complete    Palliative Care Screening Not Applicable    Skilled Nursing Facility Not Applicable

## 2023-07-21 NOTE — Consult Note (Signed)
WOC consulted for rash, appears to be drug therapy related. Discussing with bedside nursing and oncologist.  Pending possible Elink consultation. No WOC nursing on the [facility] at this time.   Yannely Kintzel [facility], CNS, [employer] [phone]

## 2023-07-21 NOTE — Progress Notes (Signed)
Palliative: Mr. Mcmains is lying quietly in bed.  He appears acutely/chronically ill and frail.  His skin rash has worsened and he is being evaluated by wound care telemedicine.  His eldest daughter, [name], is present at bedside.  Although Mr. Grow oxygen requirements have reduced, he still remains critically ill.  Amber shares that she and the family have discussed 24 to 48 hours for outcomes.  She shares family's concern about Mr. Newsham's ability to recover.  I returned later in the day to speak with Mr. Otting and his wife [name] and daughter Hospital doctor.  We talk about pneumonia and the treatment plan.  We talk about skin rash and the treatment plan.  Mr. Bloodworth states that he does not feel that his skin issue is related to any medication, but is "thin skin" like his mother had.  I reassure him that regardless we will continue to care for him.   Mr. Castille is clearly short of breath during our discussion.  He is able to express his goals.  He states that he would like to continue to treat the pneumonia, have time for some recovery.  We talk about his treatment plan.  I shared my concern about his respiratory status.  He states that he had pneumonia in the past "when I was young" and was able to recover.  His wife states that being young and not having cancer made the difference.  Again, I reassure him that we will continue to care for him and treat him.  Time for outcomes.  Conference with attending, bedside nursing staff, transition of care team related to patient condition, needs, goals of care, disposition.  Plan:    At this point continue to treat the treatable but no CPR or intubation.  Time for outcomes  50 minutes  Lillia Carmel, NP Palliative medicine team Team phone [phone]

## 2023-07-21 NOTE — Progress Notes (Signed)
PROGRESS NOTE  Matthew Rivera, is a 62 y.o. male, DOB - [birth year], OZH:[id number]  Admit date - 07/17/2023   Admitting Physician Onnie Boer, MD  Outpatient Primary MD for the patient is [name], MD  LOS - 4  Chief Complaint  Patient presents with   Shortness of Breath      Brief Narrative:  62 y.o. male with medical history significant for Stage IV non-small cell lung cancer with brain mets, status post craniotomy and surgical resection on April 07, 2023 under the care of [name], s/p Radiation to the brain cavity by Dr. Carlisle Beers currently on hold due to patient's poor tolerance of chemo despite reduced dose chemo , H/o pulmonary embolism (History of Septic Emboli August 2024) on anticoagulation admitted on 07/17/2023 with acute on chronic hypoxic respiratory failure in the setting of recurrent healthcare associated pneumonia    -Assessment and Plan: 1)Acute on Chronic Respiratory Failure with hypoxia (HCC) -Prior to admission patient was on 4 L of oxygen via nasal cannula -Currently requiring 9 to 10 L of oxygen via nasal cannula -Follow-up CT chest findings- (See detailed report) Constellation of findings favored to represent worsening multifocal pneumonia superimposed on a background of interstitial lung disease, few new or increased cavitary lesions for example in the right middle lobe. New moderate right pleural effusion and small left pleural effusion -Continue IV cefepime, Flagyl and vancomycin for multifocal pneumonia -Chest ultrasound on 07/18/2023 without adequate fluid pocket for thoracentesis -Continue bronchodilators =-Pulmonology consult appreciated -Continue Solu-Medrol and current antibiotics -Patient is less dyspneic at rest, becomes very dyspneic with change in position in bed or attempts to lean forward for exam -We have remained unable to wean oxygen down due to desaturation and this morning has ended experiencing increased work of breathing  with up to 15 L supplementation requirement. -Patient wanting to continue treatment for now; anticipating further decompensation in the next 24 to 48 hours with most likely transition to comfort care. -Prognosis is very poor and guarded.  2)HCAP (healthcare-associated pneumonia) 2 recent hospitalizations initially for multifocal pneumonia treated with ceftriazone and azitho, and subsequently admitted for HCAP- Treated with IV cefepime and vancomycin, discharged 10/3 to complete 4 more days of Augmentin which he was compliant with.   ---COVID test negative - Procalcitonin elevated, but in the setting of lung cancer,? 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 -Continue to wean off oxygen supplementation as tolerated.  3)Hypokalemia/Hyponatremia -Suspect some degree of SIADH in the setting of underlying lung cancer and pneumonia  -Avoid excessive free water, nutritional supplements advised -Avoid dehydration and Replace potassium  4)H/o pulmonary embolism (History of Septic Emboli August 2024) on anticoagulation  -continue Eliquis  5)Stage IV non-small cell lung cancer with brain mets--- status post craniotomy and surgical resection on April 07, 2023 under the care of [name], s/p Radiation to the brain cavity by Dr. Carlisle Beers currently on hold due to patient's poor tolerance of chemo despite reduced dose chemo and recurrent infection Follows with Dr. Arbutus Ped, ?? Keytruda alone in the future. -Continue outpatient follow-up with oncology service.  6)Rash -Diffuse rash may be related to Chemo. -Continue to follow clinical response. -No petechiae appreciated.  7) chronic anemia and thrombocytopenia--due to underlying malignancy and chemotherapy -Hgb  currently above 11, platelets currently above 141 no bleeding concerns, monitor closely while on Eliquis -Hgb  and platelets appear to be trending up -Transfuse as clinically indicated  8)Social/Ethics--- plan of care and advanced directive discussed with patient, his daughter [name] , son [name] and his wife [name] by Dr. Mariea Clonts. -Appreciate assistance and further recommendations by palliative care service. --Patient is now a DNR/DNI. -With assistance by palliative care service continue adjusting medications and is slowly moving/transition into symptomatic  management.  9)Moderate protein caloric malnutrition/anorexia--Remeron for appetite stimulation -Nutritional supplements requested -Dietitian consult and speech pathologist consult appreciated  10)Leukocytosis--- WBC trending up since starting Solu-Medrol suspect this is partly steroid-induced leukocytosis -Continue antibiotics as above #1 pending culture data. -Continue to follow culture results.   Status is: Inpatient   Disposition: The patient is from: Home              Anticipated d/c is to: SNF              Anticipated d/c date is: > 3 days              Patient currently is not medically stable to d/c.  Barriers: Not Clinically Stable-   Code Status :  -  Code Status: Limited: Do not attempt resuscitation (DNR) -DNR-LIMITED -Do Not Intubate/DNI    Family Communication:   (patient is alert, awake and coherent)  Discussed with daughter [name], son [name] and wife [name] at bedside  DVT Prophylaxis  :   - SCDs   Place and maintain sequential compression device Start: 07/17/23 2331 apixaban (ELIQUIS) tablet 5 mg   Lab Results  Component Value Date   PLT 141 (L) 07/19/2023   Inpatient Medications  Scheduled Meds:  apixaban  5 mg Oral BID   arformoterol  15 mcg Nebulization BID   And   umeclidinium bromide  1 puff Inhalation Daily   Chlorhexidine Gluconate Cloth  6 each Topical Daily   fluticasone  2 spray Each Nare Daily    lactose free nutrition  237 mL Oral TID BM   methylPREDNISolone (SOLU-MEDROL) injection  40 mg Intravenous Daily   mirtazapine  7.5 mg Oral QHS   multivitamin with minerals  1 tablet Oral Daily   pantoprazole  40 mg Oral BID AC   scopolamine  1 patch Transdermal Q72H   Continuous Infusions:  ceFEPime (MAXIPIME) IV 200 mL/hr at 07/21/23 1530   metronidazole Stopped (07/21/23 0925)   vancomycin Stopped (07/21/23 0012)   PRN Meds:.acetaminophen **OR** acetaminophen, albuterol, hydrOXYzine, LORazepam, morphine injection, ondansetron **OR** ondansetron (ZOFRAN) IV, mouth rinse, oxyCODONE, polyethylene glycol, polyvinyl alcohol, tamsulosin, traZODone   Anti-infectives (From admission, onward)    Start     Dose/Rate Route Frequency Ordered Stop   07/18/23 2200  vancomycin (VANCOREADY) IVPB 1250 mg/250 mL        1,250 mg 166.7 mL/hr over 90 Minutes Intravenous Every 24 hours 07/17/23 2137     07/18/23 0600  ceFEPIme (MAXIPIME) 2 g in sodium chloride 0.9 % 100 mL IVPB        2 g 200 mL/hr over 30 Minutes Intravenous Every 8 hours 07/17/23 2137     07/17/23 2130  metroNIDAZOLE (FLAGYL) IVPB 500 mg        500 mg 100 mL/hr over 60 Minutes Intravenous Every 12 hours 07/17/23 2124     07/17/23 2130  vancomycin (VANCOREADY) IVPB 1250 mg/250 mL        1,250 mg 166.7 mL/hr over 90 Minutes Intravenous  Once 07/17/23 2127 07/18/23 0800   07/17/23 2130  ceFEPIme (MAXIPIME) 2 g in sodium chloride 0.9 %  100 mL IVPB        2 g 200 mL/hr over 30 Minutes Intravenous  Once 07/17/23 2127 07/18/23 0759      Subjective: Matthew Rivera chronically ill in appearance; no chest pain, no nausea, no vomiting.  Requiring 12 to 15 L high flow nasal cannula supplementation.  Intermittently restless/agitated and demonstrating increased work of breathing and tachypnea.  Objective: Vitals:   07/21/23 1500 07/21/23 1600 07/21/23 1700 07/21/23 1800  BP: 94/65 110/62 105/68 103/63  Pulse: (!) 104 (!) 107 (!) 110 91   Resp: 20 20 (!) 26 18  Temp:      TempSrc:      SpO2: 99% 96% 97% 100%  Weight:      Height:        Intake/Output Summary (Last 24 hours) at 07/21/2023 1824 Last data filed at 07/21/2023 1530 Gross per 24 hour  Intake 692.88 ml  Output 775 ml  Net -82.12 ml   Filed Weights   07/17/23 1412 07/19/23 0500 07/20/23 0500  Weight: 62.1 kg 61.2 kg 61.8 kg   Physical Exam General exam: Alert, awake, following commands and expressing slight improvement in his breathing.  No fever.  No nausea or vomiting.  Continue experiencing intermittent episode of restlessness and agitation. Respiratory system: Positive rhonchi bilaterally (improved); demonstrating tachypnea and intermittent increased work of breathing; requiring 12-15 L high flow nasal cannula supplementation to maintain saturation. Cardiovascular system: Sinus tachycardia, no rubs, no gallops, no JVD on exam. Gastrointestinal system: Abdomen is nondistended, soft and nontender. No organomegaly or masses felt. Normal bowel sounds heard. Central nervous system: Moving 4 limbs spontaneously.  No focal neurological deficits. Extremities: No cyanosis or clubbing. Skin: No petechiae; right chest Port-A-Cath in place.  Patient with diffuse rash/desquamation and pruritus (per patient has been present for the last 2 weeks or so Psychiatry: Judgement and insight appear stable.  Flat affect.  Restless and intermittently agitated.  Data Reviewed: I have personally reviewed following labs and imaging studies  CBC: Recent Labs  Lab 07/17/23 1419 07/18/23 0617 07/19/23 0820  WBC 8.9 8.7 17.1*  NEUTROABS 6.8  --   --   HGB 11.0* 9.9* 11.1*  HCT 32.4* 28.8* 33.7*  MCV 87.3 87.0 88.9  PLT 134* 109* 141*   Basic Metabolic Panel: Recent Labs  Lab 07/17/23 1419 07/18/23 0617 07/19/23 0820 07/21/23 0444  NA 128* 127* 129* 131*  K 3.3* 4.0 3.4* 3.4*  CL 93* 95* 96* 102  CO2 23 25 23 22   GLUCOSE 97 108* 143* 90  BUN 14 12 17  27*   CREATININE 0.99 0.86 0.88 1.00  CALCIUM 7.8* 7.4* 7.4* 7.4*  PHOS  --   --  2.9  --    GFR: Estimated Creatinine Clearance: 67 mL/min (by C-G formula based on SCr of 1 mg/dL).  Liver Function Tests: Recent Labs  Lab 07/19/23 0820  ALBUMIN 1.7*   Recent Results (from the past 240 hour(s))  Resp panel by RT-PCR (RSV, Flu A&B, Covid) Anterior Nasal Swab     Status: None   Collection Time: 07/12/23  8:35 AM   Specimen: Anterior Nasal Swab  Result Value Ref Range Status   SARS Coronavirus 2 by RT PCR NEGATIVE NEGATIVE Final    Comment: (NOTE) SARS-CoV-2 target nucleic acids are NOT DETECTED.  The SARS-CoV-2 RNA is generally detectable in upper respiratory specimens during the acute phase of infection. The lowest concentration of SARS-CoV-2 viral copies this assay can detect is 138 copies/mL. A negative result  does not preclude SARS-Cov-2 infection and should not be used as the sole basis for treatment or other patient management decisions. A negative result may occur with  improper specimen collection/handling, submission of specimen other than nasopharyngeal swab, presence of viral mutation(s) within the areas targeted by this assay, and inadequate number of viral copies(<138 copies/mL). A negative result must be combined with clinical observations, patient history, and epidemiological information. The expected result is Negative.  Fact Sheet for Patients:  BloggerCourse.com  Fact Sheet for Healthcare Providers:  SeriousBroker.it  This test is no t yet approved or cleared by the [place] FDA and  has been authorized for detection and/or diagnosis of SARS-CoV-2 by FDA under an Emergency Use Authorization (EUA). This EUA will remain  in effect (meaning this test can be used) for the duration of the COVID-19 declaration under Section 564(b)(1) of the Act, 21 U.S.C.section 360bbb-3(b)(1), unless the authorization is  terminated  or revoked sooner.       Influenza A by PCR NEGATIVE NEGATIVE Final   Influenza B by PCR NEGATIVE NEGATIVE Final    Comment: (NOTE) The Xpert Xpress SARS-CoV-2/FLU/RSV plus assay is intended as an aid in the diagnosis of influenza from Nasopharyngeal swab specimens and should not be used as a sole basis for treatment. Nasal washings and aspirates are unacceptable for Xpert Xpress SARS-CoV-2/FLU/RSV testing.  Fact Sheet for Patients: BloggerCourse.com  Fact Sheet for Healthcare Providers: SeriousBroker.it  This test is not yet approved or cleared by the [place] FDA and has been authorized for detection and/or diagnosis of SARS-CoV-2 by FDA under an Emergency Use Authorization (EUA). This EUA will remain in effect (meaning this test can be used) for the duration of the COVID-19 declaration under Section 564(b)(1) of the Act, 21 U.S.C. section 360bbb-3(b)(1), unless the authorization is terminated or revoked.     Resp Syncytial Virus by PCR NEGATIVE NEGATIVE Final    Comment: (NOTE) Fact Sheet for Patients: BloggerCourse.com  Fact Sheet for Healthcare Providers: SeriousBroker.it  This test is not yet approved or cleared by the [place] FDA and has been authorized for detection and/or diagnosis of SARS-CoV-2 by FDA under an Emergency Use Authorization (EUA). This EUA will remain in effect (meaning this test can be used) for the duration of the COVID-19 declaration under Section 564(b)(1) of the Act, 21 U.S.C. section 360bbb-3(b)(1), unless the authorization is terminated or revoked.  Performed at [facility], [address], [place], [place] [address]   Urine Culture (for pregnant, neutropenic or urologic patients or patients with an indwelling urinary catheter)     Status: None   Collection Time: 07/12/23 10:01 AM   Specimen:  Urine, Clean Catch  Result Value Ref Range Status   Specimen Description   Final    URINE, CLEAN CATCH Performed at [facility], [address], [place], [place] [address]    Special Requests   Final    NONE Performed at [facility], [address] [place], [place] [address]    Culture   Final    NO GROWTH Performed at [facility], [address], [place], [place] [address]    Report Status 07/13/2023 FINAL  Final  Expectorated Sputum Assessment w Gram Stain, Rflx to Resp Cult     Status: None   Collection Time: 07/12/23 10:37 AM   Specimen: Expectorated Sputum  Result Value Ref Range Status   Specimen Description EXPECTORATED SPUTUM  Final   Special Requests NONE  Final   Sputum evaluation   Final    Sputum specimen not acceptable for testing.  Please recollect.   Alease Medina I RN @ 1141 ON 07/12/23 CAL Performed at [facility], [address], [place], [place] [address]    Report Status 07/12/2023 FINAL  Final  MRSA Next Gen by PCR, Nasal     Status: None   Collection Time: 07/13/23 11:25 AM   Specimen: Nasal Mucosa; Nasal Swab  Result Value Ref Range Status   MRSA by PCR Next Gen NOT DETECTED NOT DETECTED Final    Comment: (NOTE) The GeneXpert MRSA Assay (FDA approved for NASAL specimens only), is one component of a comprehensive MRSA colonization surveillance program. It is not intended to diagnose MRSA infection nor to guide or monitor treatment for MRSA infections. Test performance is not FDA approved in patients less than 58 years old. Performed at [facility], [address] [place], [place] [address]   Culture, blood (Routine X 2) w Reflex to ID Panel     Status: None   Collection Time: 07/13/23  3:58 PM   Specimen: BLOOD LEFT ARM  Result Value Ref Range Status   Specimen Description   Final    BLOOD LEFT ARM Performed at [facility], [address], [place],  [place] [address]    Special Requests   Final    BOTTLES DRAWN AEROBIC AND ANAEROBIC Blood Culture adequate volume Performed at [facility], [address] [place], [place] [address]    Culture   Final    NO GROWTH 5 DAYS Performed at [facility], [address] [place], [place] [address]    Report Status 07/18/2023 FINAL  Final  Culture, blood (Routine X 2) w Reflex to ID Panel     Status: None   Collection Time: 07/13/23  4:03 PM   Specimen: BLOOD RIGHT ARM  Result Value Ref Range Status   Specimen Description   Final    BLOOD RIGHT ARM Performed at [facility], [address], [place], [place] [address]    Special Requests   Final    BOTTLES DRAWN AEROBIC AND ANAEROBIC Blood Culture adequate volume Performed at [facility], [address] [place], [place] [address]    Culture   Final    NO GROWTH 5 DAYS Performed at [facility], [address] [place], [place] [address]    Report Status 07/18/2023 FINAL  Final  Blood culture (routine x 2)     Status: None (Preliminary result)   Collection Time: 07/17/23  2:19 PM   Specimen: Right Antecubital; Blood  Result Value Ref Range Status   Specimen Description   Final    RIGHT ANTECUBITAL BOTTLES DRAWN AEROBIC AND ANAEROBIC   Special Requests Blood Culture adequate volume  Final   Culture   Final    NO GROWTH 4 DAYS Performed at [facility], [address] [place] [address], [place], [place] [address]    Report Status PENDING  Incomplete  Resp panel by RT-PCR (RSV, Flu A&B, Covid) Anterior Nasal Swab     Status: None   Collection Time: 07/17/23  2:19 PM   Specimen: Anterior Nasal Swab  Result Value Ref Range Status   SARS Coronavirus 2 by RT PCR NEGATIVE NEGATIVE Final    Comment: (NOTE) SARS-CoV-2 target nucleic acids are NOT DETECTED.  The SARS-CoV-2 RNA is generally detectable in upper respiratory specimens during the acute phase  of infection. The lowest concentration of SARS-CoV-2  viral copies this assay can detect is 138 copies/mL. A negative result does not preclude SARS-Cov-2 infection and should not be used as the sole basis for treatment or other patient management decisions. A negative result may occur with  improper specimen collection/handling, submission of specimen other than nasopharyngeal swab, presence of viral mutation(s) within the areas targeted by this assay, and inadequate number of viral copies(<138 copies/mL). A negative result must be combined with clinical observations, patient history, and epidemiological information. The expected result is Negative.  Fact Sheet for Patients:  BloggerCourse.com  Fact Sheet for Healthcare Providers:  SeriousBroker.it  This test is no t yet approved or cleared by the [place] FDA and  has been authorized for detection and/or diagnosis of SARS-CoV-2 by FDA under an Emergency Use Authorization (EUA). This EUA will remain  in effect (meaning this test can be used) for the duration of the COVID-19 declaration under Section 564(b)(1) of the Act, 21 U.S.C.section 360bbb-3(b)(1), unless the authorization is terminated  or revoked sooner.       Influenza A by PCR NEGATIVE NEGATIVE Final   Influenza B by PCR NEGATIVE NEGATIVE Final    Comment: (NOTE) The Xpert Xpress SARS-CoV-2/FLU/RSV plus assay is intended as an aid in the diagnosis of influenza from Nasopharyngeal swab specimens and should not be used as a sole basis for treatment. Nasal washings and aspirates are unacceptable for Xpert Xpress SARS-CoV-2/FLU/RSV testing.  Fact Sheet for Patients: BloggerCourse.com  Fact Sheet for Healthcare Providers: SeriousBroker.it  This test is not yet approved or cleared by the [place] FDA and has been authorized for detection and/or diagnosis of SARS-CoV-2 by FDA under an Emergency Use Authorization  (EUA). This EUA will remain in effect (meaning this test can be used) for the duration of the COVID-19 declaration under Section 564(b)(1) of the Act, 21 U.S.C. section 360bbb-3(b)(1), unless the authorization is terminated or revoked.     Resp Syncytial Virus by PCR NEGATIVE NEGATIVE Final    Comment: (NOTE) Fact Sheet for Patients: BloggerCourse.com  Fact Sheet for Healthcare Providers: SeriousBroker.it  This test is not yet approved or cleared by the [place] FDA and has been authorized for detection and/or diagnosis of SARS-CoV-2 by FDA under an Emergency Use Authorization (EUA). This EUA will remain in effect (meaning this test can be used) for the duration of the COVID-19 declaration under Section 564(b)(1) of the Act, 21 U.S.C. section 360bbb-3(b)(1), unless the authorization is terminated or revoked.  Performed at [facility], [address] [place], [place] [address]   Blood culture (routine x 2)     Status: None (Preliminary result)   Collection Time: 07/17/23  2:34 PM   Specimen: BLOOD RIGHT HAND  Result Value Ref Range Status   Specimen Description   Final    BLOOD RIGHT HAND BOTTLES DRAWN AEROBIC AND ANAEROBIC   Special Requests   Final    Blood Culture results may not be optimal due to an excessive volume of blood received in culture bottles   Culture   Final    NO GROWTH 4 DAYS Performed at [facility], [address], [place], [place] [address]    Report Status PENDING  Incomplete  MRSA Next Gen by PCR, Nasal     Status: None   Collection Time: 07/18/23  7:00 AM   Specimen: Nasal Mucosa; Nasal Swab  Result Value Ref Range Status   MRSA by PCR Next Gen NOT DETECTED NOT DETECTED  Final    Comment: (NOTE) The GeneXpert MRSA Assay (FDA approved for NASAL specimens only), is one component of a comprehensive MRSA colonization surveillance program. It is not intended to diagnose MRSA infection nor to  guide or monitor treatment for MRSA infections. Test performance is not FDA approved in patients less than 59 years old. Performed at [facility], [address], [place], [place] [address]     Radiology Studies: DG CHEST PORT 1 VIEW  Result Date: 07/20/2023 CLINICAL DATA:  SOB (shortness of breath) EXAM: PORTABLE CHEST 1 VIEW COMPARISON:  CT of the chest and chest radiograph October 6, 24. FINDINGS: No substantial change in bilateral interstitial and airspace opacities. No visible pneumothorax. Cardiomediastinal silhouette is unchanged. Stable location of a right IJ approach Port-A-Cath. No evidence of acute bony abnormality. IMPRESSION: No substantial change in bilateral interstitial and airspace opacities, further characterized on recent CT chest. Electronically Signed   By: Feliberto Harts M.D.   On: 07/20/2023 11:31    Scheduled Meds:  apixaban  5 mg Oral BID   arformoterol  15 mcg Nebulization BID   And   umeclidinium bromide  1 puff Inhalation Daily   Chlorhexidine Gluconate Cloth  6 each Topical Daily   fluticasone  2 spray Each Nare Daily   lactose free nutrition  237 mL Oral TID BM   methylPREDNISolone (SOLU-MEDROL) injection  40 mg Intravenous Daily   mirtazapine  7.5 mg Oral QHS   multivitamin with minerals  1 tablet Oral Daily   pantoprazole  40 mg Oral BID AC   scopolamine  1 patch Transdermal Q72H   Continuous Infusions:  ceFEPime (MAXIPIME) IV 200 mL/hr at 07/21/23 1530   metronidazole Stopped (07/21/23 0925)   vancomycin Stopped (07/21/23 0012)    LOS: 4 days   Vassie Loll M.D on 07/21/2023 at 6:24 PM  Go to [email or web address] - for contact info  [facility] - Office  [phone]  If 7PM-7AM, please contact night-coverage [email or web address] 07/21/2023, 6:24 PM

## 2023-07-21 NOTE — Consult Note (Signed)
WOC Nurse Consult Note: Reason for Consult: rash Wound type: partial thickness skin loss over the chest, abdomen, thigh anterior and posterior, and face. 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 Palliative nurse concerned about swelling as well in the lips. I have asked her to evaluate for oral lesions.  Dressing procedure/placement/frequency: PRN xeroform to any open/weeping areas and cover with foam. Based on patient's comfort    Any other aggressive treatment for SJS would need to be addressed in a burn center.  Any PO medications or topical tx that are prescriptive would be outside of the scope of practice for the [facility] nurse.   Discussed POC with patient and bedside nurse.  Re consult if needed, will not follow at this time. Thanks  Caeley Doh[employer]ann [employer], RN,CWOCN, CNS, CWON-AP [phone])

## 2023-07-22 DIAGNOSIS — Z7189 Other specified counseling: Secondary | ICD-10-CM

## 2023-07-22 DIAGNOSIS — Z515 Encounter for palliative care: Secondary | ICD-10-CM

## 2023-07-22 DIAGNOSIS — J9621 Acute and chronic respiratory failure with hypoxia: Secondary | ICD-10-CM | POA: Diagnosis not present

## 2023-07-22 LAB — GLUCOSE, CAPILLARY
Glucose-Capillary: 111 mg/dL — ABNORMAL HIGH (ref 70–99)
Glucose-Capillary: 113 mg/dL — ABNORMAL HIGH (ref 70–99)
Glucose-Capillary: 116 mg/dL — ABNORMAL HIGH (ref 70–99)
Glucose-Capillary: 88 mg/dL (ref 70–99)

## 2023-07-22 LAB — CULTURE, BLOOD (ROUTINE X 2)
Culture: NO GROWTH
Culture: NO GROWTH
Special Requests: ADEQUATE

## 2023-07-22 LAB — CREATININE, SERUM
Creatinine, Ser: 0.97 mg/dL (ref 0.61–1.24)
GFR, Estimated: >60 mL/min (ref >60)

## 2023-07-22 NOTE — Progress Notes (Signed)
PROGRESS NOTE  Matthew Rivera, is a 62 y.o. male, DOB - 1960/11/11, WUJ:[id number]  Admit date - 07/17/2023   Admitting Physician Onnie Boer, MD  Outpatient Primary MD for the patient is Matthew Nevins, MD  LOS - 5  Chief Complaint  Patient presents with   Shortness of Breath      Brief Narrative:  62 y.o. male with medical history significant for Stage IV non-small cell lung cancer with brain mets, status post craniotomy and surgical resection on April 07, 2023 under the care of [name], s/p Radiation to the brain cavity by Dr. Carlisle Beers currently on hold due to patient's poor tolerance of chemo despite reduced dose chemo , H/o pulmonary embolism (History of Septic Emboli August 2024) on anticoagulation admitted on 07/17/2023 with acute on chronic hypoxic respiratory failure in the setting of recurrent healthcare associated pneumonia    -Assessment and Plan: 1)Acute on Chronic Respiratory Failure with hypoxia (HCC) -Prior to admission patient was on 4 L of oxygen via nasal cannula -Currently requiring 9 to 10 L of oxygen via nasal cannula -Follow-up CT chest findings- (See detailed report) Constellation of findings favored to represent worsening multifocal pneumonia superimposed on a background of interstitial lung disease, few new or increased cavitary lesions for example in the right middle lobe. New moderate right pleural effusion and small left pleural effusion -Continue IV cefepime, Flagyl and vancomycin for multifocal pneumonia -Chest ultrasound on 07/18/2023 without adequate fluid pocket for thoracentesis -Continue bronchodilators =-Pulmonology consult appreciated -Continue Solu-Medrol and current antibiotics -Patient is less dyspneic at rest, becomes very dyspneic with change in position in bed or attempts to lean forward for exam -We have remained unable to wean oxygen down due to desaturation and this morning has ended experiencing increased work of breathing  with up to 15 L supplementation requirement. -Patient wanting to continue treatment for now; anticipating further decompensation in the next 24 to 48 hours with most likely transition to comfort care. -Prognosis is very poor and guarded.  2)HCAP (healthcare-associated pneumonia) 2 recent hospitalizations initially for multifocal pneumonia treated with ceftriazone and azitho, and subsequently admitted for HCAP- Treated with IV cefepime and vancomycin, discharged 10/3 to complete 4 more days of Augmentin which he was compliant with.   ---COVID test negative - Procalcitonin elevated, but in the setting of lung cancer,? 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 -Continue to wean off oxygen supplementation as tolerated.  3)Hypokalemia/Hyponatremia -Suspect some degree of SIADH in the setting of underlying lung cancer and pneumonia  -Avoid excessive free water, nutritional supplements advised -Avoid dehydration and Replace potassium  4)H/o pulmonary embolism (History of Septic Emboli August 2024) on anticoagulation  -continue Eliquis  5)Stage IV non-small cell lung cancer with brain mets--- status post craniotomy and surgical resection on April 07, 2023 under the care of [name], s/p Radiation to the brain cavity by Dr. Carlisle Beers currently on hold due to patient's poor tolerance of chemo despite reduced dose chemo and recurrent infection Follows with Dr. Arbutus Ped, ?? Keytruda alone in the future. -Continue outpatient follow-up with oncology service.  6)Rash -Diffuse rash may be related to Chemo. -Continue to follow clinical response. -No petechiae appreciated.  7) chronic anemia and thrombocytopenia--due to underlying malignancy and chemotherapy -Hgb  currently above 11, platelets currently above 141 no bleeding concerns, monitor closely while on Eliquis -Hgb  and platelets appear to be trending up -Transfuse as clinically indicated  8)Social/Ethics--- plan of care and advanced directive discussed with patient, [name] , son Matthew Rivera and [name] by Dr. Mariea Clonts. -Appreciate assistance and further recommendations by palliative care service. --Patient is now a DNR/DNI. -With assistance by palliative care service continue adjusting medications and is slowly moving/transition into symptomatic  management.  9)Moderate protein caloric malnutrition/anorexia--Remeron for appetite stimulation -Nutritional supplements requested -Dietitian consult and speech pathologist consult appreciated  10)Leukocytosis--- WBC trending up since starting Solu-Medrol suspect this is partly steroid-induced leukocytosis -Continue antibiotics as above #1 pending culture data. -Continue to follow culture results.  11) dysphagia -N.p.o. until speech therapy evaluate patient further adjustment into his diet consistency -Will use IV route as much as possible to provide medication.   Status is: Inpatient   Disposition: The patient is from: Home              Anticipated d/c is to: SNF              Anticipated d/c date is: > 3 days              Patient currently is not medically stable to d/c.  Barriers: Not Clinically Stable-   Code Status :  -  Code Status: Limited: Do not attempt resuscitation (DNR) -DNR-LIMITED -Do Not Intubate/DNI    Family Communication:   (patient is alert, awake and coherent)  Discussed with daughter Matthew Rivera, son Matthew Rivera and wife [name] at bedside  DVT Prophylaxis  :   - SCDs   Place and maintain sequential compression device Start: 07/17/23 2331 apixaban (ELIQUIS) tablet 5 mg   Lab Results  Component Value Date   PLT 141 (L) 07/19/2023   Inpatient Medications  Scheduled Meds:  apixaban  5 mg Oral BID   arformoterol  15 mcg  Nebulization BID   And   umeclidinium bromide  1 puff Inhalation Daily   Chlorhexidine Gluconate Cloth  6 each Topical Daily   fluticasone  2 spray Each Nare Daily   lactose free nutrition  237 mL Oral TID BM   methylPREDNISolone (SOLU-MEDROL) injection  40 mg Intravenous Daily   mirtazapine  7.5 mg Oral QHS   multivitamin with minerals  1 tablet Oral Daily   pantoprazole  40 mg Oral BID AC   scopolamine  1 patch Transdermal Q72H   Continuous Infusions:  ceFEPime (MAXIPIME) IV 2 g (07/22/23 1338)   metronidazole 500 mg (07/22/23 0911)   vancomycin 1,250 mg (07/21/23 2241)   PRN Meds:.acetaminophen **OR** acetaminophen, albuterol, hydrOXYzine, LORazepam, morphine injection, ondansetron **OR** ondansetron (ZOFRAN) IV, mouth rinse, oxyCODONE, polyethylene glycol, polyvinyl alcohol, tamsulosin, traZODone   Anti-infectives (From admission, onward)    Start     Dose/Rate Route Frequency Ordered Stop   07/18/23 2200  vancomycin (VANCOREADY) IVPB 1250 mg/250 mL        1,250 mg 166.7 mL/hr over 90 Minutes Intravenous Every 24 hours 07/17/23 2137     07/18/23 0600  ceFEPIme (MAXIPIME) 2 g in sodium chloride 0.9 % 100 mL IVPB        2 g 200 mL/hr over 30 Minutes Intravenous Every 8 hours 07/17/23 2137     07/17/23 2130  metroNIDAZOLE (FLAGYL) IVPB 500 mg        500 mg 100 mL/hr over 60 Minutes Intravenous Every 12 hours 07/17/23 2124     07/17/23 2130  vancomycin (VANCOREADY) IVPB 1250 mg/250 mL  1,250 mg 166.7 mL/hr over 90 Minutes Intravenous  Once 07/17/23 2127 07/18/23 0800   07/17/23 2130  ceFEPIme (MAXIPIME) 2 g in sodium chloride 0.9 % 100 mL IVPB        2 g 200 mL/hr over 30 Minutes Intravenous  Once 07/17/23 2127 07/18/23 0759      Subjective: Nycere Reny demonstrating difficulty speaking in full sentences, chronically ill in appearance and requiring higher level of oxygen supplementation at times.  Patient with isolated episode of choking while eating and concern for  dysphagia.  Objective: Vitals:   07/22/23 1145 07/22/23 1200 07/22/23 1300 07/22/23 1551  BP:  115/74 119/79   Pulse:  (!) 106 (!) 110   Resp:  (!) 30 14   Temp: 98.3 F (36.8 C)   98.1 F (36.7 C)  TempSrc: Axillary   Axillary  SpO2:  100% 100%   Weight:      Height:        Intake/Output Summary (Last 24 hours) at 07/22/2023 1614 Last data filed at 07/22/2023 1502 Gross per 24 hour  Intake --  Output 1450 ml  Net -1450 ml   Filed Weights   07/17/23 1412 07/19/23 0500 07/20/23 0500  Weight: 62.1 kg 61.2 kg 61.8 kg   Physical Exam General exam: Alert, awake, following commands and demonstrating increased shortness of breath and increased requirement of oxygen supplementation (up to the use of nonrebreather overnight and currently 15 L high flow nasal cannula).  Short winded with minimal exertion. Respiratory system: Decreased breath sounds at the bases; positive rhonchi right, positive tachypnea appreciated.  Having difficulty speaking in full sentences. Cardiovascular system: Sinus tachycardia, no rubs, no gallops, no JVD. Gastrointestinal system: Abdomen is nondistended, soft and nontender. No organomegaly or masses felt. Normal bowel sounds heard. Central nervous system: Moving 4 limbs spontaneously.  No focal neurological deficits. Extremities: No cyanosis or clubbing. Skin: No petechiae; diffuse erythematous rash and desquamation appreciated (overall slightly better and no having signs of superimposed infection at the moment). Psychiatry: Flat affect appreciated.  Data Reviewed: I have personally reviewed following labs and imaging studies  CBC: Recent Labs  Lab 07/17/23 1419 07/18/23 0617 07/19/23 0820  WBC 8.9 8.7 17.1*  NEUTROABS 6.8  --   --   HGB 11.0* 9.9* 11.1*  HCT 32.4* 28.8* 33.7*  MCV 87.3 87.0 88.9  PLT 134* 109* 141*   Basic Metabolic Panel: Recent Labs  Lab 07/17/23 1419 07/18/23 0617 07/19/23 0820 07/21/23 0444 07/22/23 0441  NA 128*  127* 129* 131*  --   K 3.3* 4.0 3.4* 3.4*  --   CL 93* 95* 96* 102  --   CO2 23 25 23 22   --   GLUCOSE 97 108* 143* 90  --   BUN 14 12 17  27*  --   CREATININE 0.99 0.86 0.88 1.00 0.97  CALCIUM 7.8* 7.4* 7.4* 7.4*  --   PHOS  --   --  2.9  --   --    GFR: Estimated Creatinine Clearance: 69 mL/min (by C-G formula based on SCr of 0.97 mg/dL).  Liver Function Tests: Recent Labs  Lab 07/19/23 0820  ALBUMIN 1.7*   Recent Results (from the past 240 hour(s))  MRSA Next Gen by PCR, Nasal     Status: None   Collection Time: 07/13/23 11:25 AM   Specimen: Nasal Mucosa; Nasal Swab  Result Value Ref Range Status   MRSA by PCR Next Gen NOT DETECTED NOT DETECTED Final    Comment: (  NOTE) The GeneXpert MRSA Assay (FDA approved for NASAL specimens only), is one component of a comprehensive MRSA colonization surveillance program. It is not intended to diagnose MRSA infection nor to guide or monitor treatment for MRSA infections. Test performance is not FDA approved in patients less than 46 years old. Performed at [facility], [address], [place], [place] [address]   Culture, blood (Routine X 2) w Reflex to ID Panel     Status: None   Collection Time: 07/13/23  3:58 PM   Specimen: BLOOD LEFT ARM  Result Value Ref Range Status   Specimen Description   Final    BLOOD LEFT ARM Performed at [facility], [address] [place], [place] [address]    Special Requests   Final    BOTTLES DRAWN AEROBIC AND ANAEROBIC Blood Culture adequate volume Performed at [facility], [address] [place], [place] [address]    Culture   Final    NO GROWTH 5 DAYS Performed at [facility], [address], [place], [place] [address]    Report Status 07/18/2023 FINAL  Final  Culture, blood (Routine X 2) w Reflex to ID Panel     Status: None   Collection Time: 07/13/23  4:03 PM   Specimen: BLOOD RIGHT ARM  Result Value Ref Range Status   Specimen  Description   Final    BLOOD RIGHT ARM Performed at [facility], [address] [place], [place] [address]    Special Requests   Final    BOTTLES DRAWN AEROBIC AND ANAEROBIC Blood Culture adequate volume Performed at [facility], [address], [place], [place] [address]    Culture   Final    NO GROWTH 5 DAYS Performed at [facility], [address] [place], [place] [address]    Report Status 07/18/2023 FINAL  Final  Blood culture (routine x 2)     Status: None   Collection Time: 07/17/23  2:19 PM   Specimen: Right Antecubital; Blood  Result Value Ref Range Status   Specimen Description   Final    RIGHT ANTECUBITAL BOTTLES DRAWN AEROBIC AND ANAEROBIC   Special Requests Blood Culture adequate volume  Final   Culture   Final    NO GROWTH 5 DAYS Performed at [facility], [address], [place], [place] [address]    Report Status 07/22/2023 FINAL  Final  Resp panel by RT-PCR (RSV, Flu A&B, Covid) Anterior Nasal Swab     Status: None   Collection Time: 07/17/23  2:19 PM   Specimen: Anterior Nasal Swab  Result Value Ref Range Status   SARS Coronavirus 2 by RT PCR NEGATIVE NEGATIVE Final    Comment: (NOTE) SARS-CoV-2 target nucleic acids are NOT DETECTED.  The SARS-CoV-2 RNA is generally detectable in upper respiratory specimens during the acute phase of infection. The lowest concentration of SARS-CoV-2 viral copies this assay can detect is 138 copies/mL. A negative result does not preclude SARS-Cov-2 infection and should not be used as the sole basis for treatment or other patient management decisions. A negative result may occur with  improper specimen collection/handling, submission of specimen other than nasopharyngeal swab, presence of viral mutation(s) within the areas targeted by this assay, and inadequate number of viral copies(<138 copies/mL). A negative result must be combined with clinical observations, patient history, and  epidemiological information. The expected result is Negative.  Fact Sheet for Patients:  BloggerCourse.com  Fact Sheet for Healthcare Providers:  SeriousBroker.it  This test is no t yet approved or cleared by the [place] FDA and  has been authorized for detection and/or diagnosis of SARS-CoV-2 by FDA under an Emergency Use Authorization (EUA). This EUA will remain  in effect (meaning this test can be used) for the duration of the COVID-19 declaration under Section 564(b)(1) of the Act, 21 U.S.C.section 360bbb-3(b)(1), unless the authorization is terminated  or revoked sooner.       Influenza A by PCR NEGATIVE NEGATIVE Final   Influenza B by PCR NEGATIVE NEGATIVE Final    Comment: (NOTE) The Xpert Xpress SARS-CoV-2/FLU/RSV plus assay is intended as an aid in the diagnosis of influenza from Nasopharyngeal swab specimens and should not be used as a sole basis for treatment. Nasal washings and aspirates are unacceptable for Xpert Xpress SARS-CoV-2/FLU/RSV testing.  Fact Sheet for Patients: BloggerCourse.com  Fact Sheet for Healthcare Providers: SeriousBroker.it  This test is not yet approved or cleared by the [place] FDA and has been authorized for detection and/or diagnosis of SARS-CoV-2 by FDA under an Emergency Use Authorization (EUA). This EUA will remain in effect (meaning this test can be used) for the duration of the COVID-19 declaration under Section 564(b)(1) of the Act, 21 U.S.C. section 360bbb-3(b)(1), unless the authorization is terminated or revoked.     Resp Syncytial Virus by PCR NEGATIVE NEGATIVE Final    Comment: (NOTE) Fact Sheet for Patients: BloggerCourse.com  Fact Sheet for Healthcare Providers: SeriousBroker.it  This test is not yet approved or cleared by the [place] FDA and has been  authorized for detection and/or diagnosis of SARS-CoV-2 by FDA under an Emergency Use Authorization (EUA). This EUA will remain in effect (meaning this test can be used) for the duration of the COVID-19 declaration under Section 564(b)(1) of the Act, 21 U.S.C. section 360bbb-3(b)(1), unless the authorization is terminated or revoked.  Performed at [facility], [address] [place], [place] [address]   Blood culture (routine x 2)     Status: None   Collection Time: 07/17/23  2:34 PM   Specimen: BLOOD RIGHT HAND  Result Value Ref Range Status   Specimen Description   Final    BLOOD RIGHT HAND BOTTLES DRAWN AEROBIC AND ANAEROBIC   Special Requests   Final    Blood Culture results may not be optimal due to an excessive volume of blood received in culture bottles   Culture   Final    NO GROWTH 5 DAYS Performed at [facility], [address] [place], [place] [address]    Report Status 07/22/2023 FINAL  Final  MRSA Next Gen by PCR, Nasal     Status: None   Collection Time: 07/18/23  7:00 AM   Specimen: Nasal Mucosa; Nasal Swab  Result Value Ref Range Status   MRSA by PCR Next Gen NOT DETECTED NOT DETECTED Final    Comment: (NOTE) The GeneXpert MRSA Assay (FDA approved for NASAL specimens only), is one component of a comprehensive MRSA colonization surveillance program. It is not intended to diagnose MRSA infection nor to guide or monitor treatment for MRSA infections. Test performance is not FDA approved in patients less than 75 years old. Performed at [facility], [address], [place], [place] [address]     Radiology Studies: No results found.  Scheduled Meds:  apixaban  5 mg Oral BID   arformoterol  15 mcg Nebulization BID   And   umeclidinium bromide  1 puff  Inhalation Daily   Chlorhexidine Gluconate Cloth  6 each Topical Daily   fluticasone  2 spray Each Nare Daily   lactose free nutrition  237 mL Oral TID BM   methylPREDNISolone (SOLU-MEDROL) injection  40 mg  Intravenous Daily   mirtazapine  7.5 mg Oral QHS   multivitamin with minerals  1 tablet Oral Daily   pantoprazole  40 mg Oral BID AC   scopolamine  1 patch Transdermal Q72H   Continuous Infusions:  ceFEPime (MAXIPIME) IV 2 g (07/22/23 1338)   metronidazole 500 mg (07/22/23 0911)   vancomycin 1,250 mg (07/21/23 2241)    LOS: 5 days   Vassie Loll M.D on 07/22/2023 at 4:14 PM  Go to [email or web address] - for contact info  [facility] - Office  [phone]  If 7PM-7AM, please contact night-coverage [email or web address] 07/22/2023, 4:14 PM

## 2023-07-22 NOTE — Progress Notes (Signed)
Brief PCCM Progress Note  Chart reviewed and care plan discussed with primary team.  Despite current medical support including antibiotics, steroids and nebulizers, patient has continued to have increased/high oxygen needs up to 15L O2 with ongoing shortness of breath. Palliative team has been following. Patient transitioned to DNR/DNI on 07/18/23 and considering hospice if he continues to decline in the next 24-48 hours. No changes to pulmonary plan at this time for management of his HCAP.  Pulmonary team will sign off. Please call for any questions or concerns.  Mechele Collin, M.D. [facility] Pulmonary/Critical Care Medicine 07/22/2023 6:23 PM

## 2023-07-22 NOTE — Plan of Care (Signed)
  Problem: Education: Goal: Knowledge of the prescribed therapeutic regimen will improve Outcome: Progressing   Problem: Clinical Measurements: Goal: Usual level of consciousness will be regained or maintained. Outcome: Progressing Goal: Neurologic status will improve Outcome: Progressing Goal: Ability to maintain intracranial pressure will improve Outcome: Progressing   Problem: Skin Integrity: Goal: Demonstration of wound healing without infection will improve Outcome: Progressing   Problem: Activity: Goal: Ability to tolerate increased activity will improve Outcome: Progressing   Problem: Clinical Measurements: Goal: Ability to maintain a body temperature in the normal range will improve Outcome: Progressing   Problem: Respiratory: Goal: Ability to maintain adequate ventilation will improve Outcome: Progressing Goal: Ability to maintain a clear airway will improve Outcome: Progressing   Problem: Education: Goal: Knowledge of General Education information will improve Description: Including pain rating scale, medication(s)/side effects and non-pharmacologic comfort measures Outcome: Progressing   Problem: Health Behavior/Discharge Planning: Goal: Ability to manage health-related needs will improve Outcome: Progressing   Problem: Clinical Measurements: Goal: Ability to maintain clinical measurements within normal limits will improve Outcome: Progressing Goal: Will remain free from infection Outcome: Progressing Goal: Diagnostic test results will improve Outcome: Progressing Goal: Respiratory complications will improve Outcome: Progressing Goal: Cardiovascular complication will be avoided Outcome: Progressing   Problem: Activity: Goal: Risk for activity intolerance will decrease Outcome: Progressing   Problem: Nutrition: Goal: Adequate nutrition will be maintained Outcome: Progressing   Problem: Coping: Goal: Level of anxiety will decrease Outcome:  Progressing   Problem: Elimination: Goal: Will not experience complications related to bowel motility Outcome: Progressing Goal: Will not experience complications related to urinary retention Outcome: Progressing   Problem: Pain Managment: Goal: General experience of comfort will improve Outcome: Progressing   Problem: Safety: Goal: Ability to remain free from injury will improve Outcome: Progressing   Problem: Skin Integrity: Goal: Risk for impaired skin integrity will decrease Outcome: Progressing

## 2023-07-22 NOTE — Evaluation (Signed)
Clinical/Bedside Swallow Evaluation Patient Details  Name: Matthew Rivera MRN: [id number] Date of Birth: [birth year]  Today's Date: 07/22/2023 Time: SLP Start Time (ACUTE ONLY): 1015 SLP Stop Time (ACUTE ONLY): 1039 SLP Time Calculation (min) (ACUTE ONLY): 24 min  Past Medical History:  Past Medical History:  Diagnosis Date   Dyslipidemia    GERD (gastroesophageal reflux disease)    Lung nodule seen on imaging study 03/31/2023   seen on CT results   Osteoarthritis    Smoker    long term smoker 1 ppd   Past Surgical History:  Past Surgical History:  Procedure Laterality Date   BACK SURGERY     x 1   COLONOSCOPY WITH PROPOFOL N/A 08/18/2020   Procedure: COLONOSCOPY WITH PROPOFOL;  Surgeon: Lanelle Bal, DO;  Location: [facility];  Service: Endoscopy;  Laterality: N/A;  7:30am   CRANIOTOMY Right 04/07/2023   Procedure: Right suboccipital craniotomy for tumor resection;  Surgeon: Tressie Stalker, MD;  Location: [facility] OR;  Service: Neurosurgery;  Laterality: Right;  TO follow   IR IMAGING GUIDED PORT INSERTION  05/12/2023   MULTIPLE TOOTH EXTRACTIONS     wears full dentures   POLYPECTOMY  08/18/2020   Procedure: POLYPECTOMY INTESTINAL;  Surgeon: Lanelle Bal, DO;  Location: [facility];  Service: Endoscopy;;   HPI:  Matthew Rivera is a 62 y.o. man with with a past medical history of stage IV lung cancer with brain metastases who presents with worsening respiratory failure and shortness of breath.  He has had progressive clinical decline over the last few months.  He was diagnosed in June 2024.  Since then has had craniotomy and surgical resection of metastases as well as brain radiation and systemic chemotherapy.  His clinical course has been complicated by intolerance to chemotherapy, as well as multifocal pneumonia and acute pulmonary embolism for which she is now on Eliquis.  He was just discharged last week for healthcare associated pneumonia.  Unfortunately soon after coming  home he had worsening shortness of breath from his 3 L nasal cannula and needed more oxygen.  He presented to the ED.  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  Pulmonary has been consulted in the setting of worsening respiratory failure and to see if any other treatment options can be offered.  He is currently on broad-spectrum antibiotics with vancomycin and cefepime. BSE completed 10/8 with recommendation for reg/thin. BSE re-ordered today for new reports of swallowing discomfort.    Assessment / Plan / Recommendation  Clinical Impression  Clinical swallowing re-evaluation requested as family reports new swallowing discomfort/difficulty in the last 12 hours. Patient denies swallowing difficulty to SLP and swallowed limited trials of thin liquids and ice cream without overt s/sx of oropharyngeal dysphagia. Note Pt made facial grimacing and groaning when SLP touched labial surface to facilitate opening oral cavity; when asked to explain, Pt denies pain. Note dry cracked lingual surface and no visible lesions, however Pt did not want to take out his dentures therefore oral check was not completely thorough. Pt remains at high risk for aspiration secondary to compromised respiratory function and generalized weakness. Aspiration precautions revewed with Pt and family. Recommend continue with regular/thin diet and meds whole with liquids. ST will continue to follow acutely to ensure diet tolerance. Thank you for this referral, SLP Visit Diagnosis: Dysphagia, oropharyngeal phase (R13.12)  Aspiration Risk  Mild aspiration risk    Diet Recommendation Regular;Thin liquid    Liquid Administration via: Straw;Cup Medication Administration: Whole meds with liquid Supervision: Patient able to self feed Compensations: Minimize environmental  distractions;Slow rate Postural Changes: Seated upright at 90 degrees    Other  Recommendations Oral Care Recommendations: Oral care BID    Recommendations for follow up therapy are one component of a multi-disciplinary discharge planning process, led by the attending physician.  Recommendations may be updated based on patient status, additional functional criteria and insurance authorization.           Frequency and Duration min 1 x/week  1 week       Prognosis Prognosis for improved oropharyngeal function: Fair      Swallow Study   General HPI: Matthew Rivera is a 62 y.o. man with with a past medical history of stage IV lung cancer with brain metastases who presents with worsening respiratory failure and shortness of breath.  He has had progressive clinical decline over the last few months.  He was diagnosed in June 2024.  Since then has had craniotomy and surgical resection of metastases as well as brain radiation and systemic chemotherapy.  His clinical course has been complicated by intolerance to chemotherapy, as well as multifocal pneumonia and acute pulmonary embolism for which she is now on Eliquis.  He was just discharged last week for healthcare associated pneumonia.  Unfortunately soon after coming home he had worsening shortness of breath from his 3 L nasal cannula and needed more oxygen.  He presented to the ED.  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  Pulmonary has been consulted in the setting of worsening respiratory failure and to see if any other treatment options can be offered.  He is currently on broad-spectrum antibiotics with vancomycin and cefepime. BSE completed 10/8 with recommendation for reg/thin. BSE re-ordered today for new reports of swallowing discomfort. Type of Study: Bedside Swallow  Evaluation Previous Swallow Assessment: BSE earlier this hospitalization 07/19/23 Diet Prior to this Study: Regular;Thin liquids (Level 0) Temperature Spikes Noted: No Respiratory Status: Nasal cannula History of Recent Intubation: No Behavior/Cognition: Alert;Cooperative Oral Cavity Assessment: Dry Oral Care Completed by SLP: Recent completion by staff Oral Cavity - Dentition: Dentures, top;Dentures, bottom Vision: Functional for self-feeding Self-Feeding Abilities: Able to feed self;Needs assist;Needs set up Patient Positioning: Upright in bed Baseline Vocal Quality: Normal Volitional Cough: Strong;Congested Volitional Swallow: Able to elicit    Oral/Motor/Sensory Function Overall Oral Motor/Sensory Function: Generalized oral weakness Facial ROM: Within Functional Limits   Ice Chips Ice chips: Within functional limits   Thin Liquid Thin Liquid: Within functional limits    Nectar Thick Nectar Thick Liquid: Not tested   Honey Thick Honey Thick Liquid: Not tested   Puree Puree: Within functional limits   Solid     Solid: Not tested     Matthew Rivera H. Romie Levee, CCC-SLP Speech Language Pathologist  Georgetta Haber 07/22/2023,10:40 AM

## 2023-07-23 DIAGNOSIS — I2782 Chronic pulmonary embolism: Secondary | ICD-10-CM | POA: Diagnosis not present

## 2023-07-23 DIAGNOSIS — C3492 Malignant neoplasm of unspecified part of left bronchus or lung: Secondary | ICD-10-CM | POA: Diagnosis not present

## 2023-07-23 DIAGNOSIS — E876 Hypokalemia: Secondary | ICD-10-CM | POA: Diagnosis not present

## 2023-07-23 DIAGNOSIS — L8990 Pressure ulcer of unspecified site, unspecified stage: Secondary | ICD-10-CM

## 2023-07-23 DIAGNOSIS — J9621 Acute and chronic respiratory failure with hypoxia: Secondary | ICD-10-CM | POA: Diagnosis not present

## 2023-07-23 DIAGNOSIS — R21 Rash and other nonspecific skin eruption: Secondary | ICD-10-CM

## 2023-07-23 LAB — VANCOMYCIN, TROUGH: Vancomycin Tr: 17 ug/mL (ref 15–20)

## 2023-07-23 LAB — GLUCOSE, CAPILLARY: Glucose-Capillary: 106 mg/dL — ABNORMAL HIGH (ref 70–99)

## 2023-07-23 MED ORDER — HYDROMORPHONE HCL 1 MG/ML IJ SOLN
1.0000 mg | INTRAMUSCULAR | Status: DC | PRN
  Administered 2023-07-23: 1 mg via INTRAVENOUS
  Filled 2023-07-23: qty 1

## 2023-07-23 MED ORDER — HYDROMORPHONE HCL 1 MG/ML IJ SOLN
2.0000 mg | INTRAMUSCULAR | Status: DC | PRN
  Administered 2023-07-23 – 2023-07-27 (×9): 2 mg via INTRAVENOUS
  Filled 2023-07-23 (×9): qty 2

## 2023-07-23 MED ORDER — VANCOMYCIN HCL 1250 MG/250ML IV SOLN
1250.0000 mg | INTRAVENOUS | Status: DC
  Administered 2023-07-23 – 2023-07-25 (×3): 1250 mg via INTRAVENOUS
  Filled 2023-07-23 (×2): qty 250

## 2023-07-23 NOTE — Assessment & Plan Note (Signed)
Patient with poor oral intake.  Poor prognosis. Continue nutritional supplements.

## 2023-07-23 NOTE — TOC Progression Note (Signed)
Transition of Care [facility]) - Progression Note    Patient Details  Name: Matthew Rivera MRN: [id number] Date of Birth: 08/11/1961  [facility]) CM/SW Contact  Catalina Gravel, LCSW Phone Number: 07/23/2023, 6:00 PM  Clinical Narrative:    CSW spoke with pt and spouse at bedside.Plan is in home hospice. CSW secure chat to MD to clarify if palliative referral made or TOC can initiate.  TOC to follow.      Barriers to Discharge: Continued Medical Work up  Expected Discharge Plan and Services                                               Social Determinants of Health (SDOH) Interventions SDOH Screenings   Food Insecurity: No Food Insecurity (07/17/2023)  Housing: Low Risk  (07/17/2023)  Transportation Needs: No Transportation Needs (07/17/2023)  Utilities: Not At Risk (07/17/2023)  Alcohol Screen: Low Risk  (05/03/2023)  Depression (PHQ2-9): Low Risk  (05/02/2023)  Tobacco Use: High Risk (07/18/2023)    Readmission Risk Interventions    07/18/2023   12:26 PM 07/12/2023    1:54 PM 06/03/2023    1:54 PM  Readmission Risk Prevention Plan  Transportation Screening Complete Complete Complete  PCP or Specialist Appt within 3-5 Days  Complete Complete  HRI or Home Care Consult  Complete Complete  Social Work Consult for Recovery Care Planning/Counseling  Complete Complete  Palliative Care Screening  Not Applicable Not Complete  Palliative Care Screening Not Complete Comments   MD has orderes palliative for GOC  Medication Review Oceanographer) Complete Complete Complete  HRI or Home Care Consult Complete    SW Recovery Care/Counseling Consult Complete    Palliative Care Screening Not Applicable    Skilled Nursing Facility Not Applicable

## 2023-07-23 NOTE — Progress Notes (Signed)
Dr.Arrien notified of the following:  Pt's skin becoming more fragile,peeling areas increasing and painful. Pt's mouth/throat also getting more painful and if now making swallowing more difficult.   Pt.verbalizes  his vision is getting blurry. Pt's eyes are noted to be red and watering.  Dr.Arrian notified.
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
## 2023-07-23 NOTE — Plan of Care (Signed)
  Problem: Education: Goal: Knowledge of the prescribed therapeutic regimen will improve Outcome: Progressing   Problem: Clinical Measurements: Goal: Usual level of consciousness will be regained or maintained. Outcome: Progressing Goal: Neurologic status will improve Outcome: Progressing Goal: Ability to maintain intracranial pressure will improve Outcome: Progressing   Problem: Skin Integrity: Goal: Demonstration of wound healing without infection will improve Outcome: Progressing   Problem: Activity: Goal: Ability to tolerate increased activity will improve Outcome: Progressing   Problem: Clinical Measurements: Goal: Ability to maintain a body temperature in the normal range will improve Outcome: Progressing   Problem: Respiratory: Goal: Ability to maintain adequate ventilation will improve Outcome: Progressing Goal: Ability to maintain a clear airway will improve Outcome: Progressing   Problem: Education: Goal: Knowledge of General Education information will improve Description: Including pain rating scale, medication(s)/side effects and non-pharmacologic comfort measures Outcome: Progressing   Problem: Health Behavior/Discharge Planning: Goal: Ability to manage health-related needs will improve Outcome: Progressing   Problem: Clinical Measurements: Goal: Ability to maintain clinical measurements within normal limits will improve Outcome: Progressing Goal: Will remain free from infection Outcome: Progressing Goal: Diagnostic test results will improve Outcome: Progressing Goal: Respiratory complications will improve Outcome: Progressing Goal: Cardiovascular complication will be avoided Outcome: Progressing   Problem: Activity: Goal: Risk for activity intolerance will decrease Outcome: Progressing   Problem: Nutrition: Goal: Adequate nutrition will be maintained Outcome: Progressing   Problem: Coping: Goal: Level of anxiety will decrease Outcome:  Progressing   Problem: Elimination: Goal: Will not experience complications related to bowel motility Outcome: Progressing Goal: Will not experience complications related to urinary retention Outcome: Progressing   Problem: Pain Managment: Goal: General experience of comfort will improve Outcome: Progressing   Problem: Safety: Goal: Ability to remain free from injury will improve Outcome: Progressing   Problem: Skin Integrity: Goal: Risk for impaired skin integrity will decrease Outcome: Progressing

## 2023-07-23 NOTE — Progress Notes (Signed)
Pt.continues to express pain and discomfort with mouth/throat and skin.  Pt's skin is progressing in areas of peeling,pain,and open areas. Even paper tape will pull layers of tissue off when removed. Pt.unable tolerate even having BP cuff to remain on at all times due to pain and skin burning and peeling underneath cuff.   Both arms are now wrapped in xeroform and areas are increasing with each assessment. Pt.having more difficulty swallowing due to pain in mouth and throat.

## 2023-07-23 NOTE — Assessment & Plan Note (Addendum)
  Nutrition Documentation    Flowsheet Row ED to Hosp-Admission (Current) from 07/17/2023 in [place] INTENSIVE CARE UNIT  Nutrition Problem Severe Malnutrition  Etiology chronic illness  [lung cancer with brain mets]  Nutrition Goal Patient will meet greater than or equal to 90% of their needs  Interventions Boost Plus, MVI     Present on admission.  Active Pressure Injury/Wound(s)     Pressure Ulcer  Duration          Pressure Injury 07/17/23 Sacrum Stage 1 -  Intact skin with non-blanchable redness of a localized area usually over a bony prominence. 5 days

## 2023-07-23 NOTE — Hospital Course (Addendum)
Matthew Rivera was admitted to the hospital with the working diagnosis of acute on chronic hypoxemic respiratory failure.   62 y.o. male with medical history significant for Stage IV non-small cell lung cancer with brain mets, status post craniotomy and surgical resection on April 07, 2023 under the care of [name], s/p Radiation to the brain cavity by Dr. Carlisle Beers currently on hold due to patient's poor tolerance of chemo despite reduced dose chemo , H/o pulmonary embolism (History of Septic Emboli August 2024) on anticoagulation admitted on 07/17/2023 with acute on chronic hypoxic respiratory failure in the setting of recurrent healthcare associated pneumonia.  Patient was placed on broad spectrum antibiotic therapy and systemic corticosteroids.  High 02 requirements.   10/12 patient with progressive dyspnea and pain, increased 02 requirements to heated high flow nasal cannula. Goals of care addressed, he would like to go home with hospice services.  Change morphine to hydromorphone.  10/13 increase frequency oxycodone. 02 requirements down to 13 L/min per high flow nasal cannula.
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
## 2023-07-23 NOTE — Assessment & Plan Note (Signed)
Multifocal pneumonia. (Health care associated pneumonia).   10/06 CT chest with bilateral upper and lower lobes dense ground glass opacities, with traction bronchiectasis and fibrotic changes, predominant at bases, right pleural effusion and compression atelectasis. (My interpretation).  Cavitary lesion right middle lobe 3,0 x 3,4 cm.  Nodular mass posterior right lower lobe 2.4 x 1.4 cm.  Left lower lobe irregular spiculated lesion has decreased in size to 3,7 cm from 4,6 cm.   10/09 chest radiograph with bilateral lower lobes interstitial infiltrates. Port in place right internal jugular vein.   02 saturation this morning is 99% on 13/L per min per high flow nasal cannular.  Wbc is 17.2  Cultures have been no growth.   Plan to continue antibiotic therapy with cefepime and vancomycin, to complete 8 days.  Continue systemic corticosteroids and bronchodilator therapy.   Patient with poor prognosis, his goal of care is to go home with hospice services If his 02 requirements go down to 10 L/min per high flown nasal cannula he can potentially go home with 02 maximizer device.

## 2023-07-24 DIAGNOSIS — J9621 Acute and chronic respiratory failure with hypoxia: Secondary | ICD-10-CM | POA: Diagnosis not present

## 2023-07-24 DIAGNOSIS — E876 Hypokalemia: Secondary | ICD-10-CM | POA: Diagnosis not present

## 2023-07-24 DIAGNOSIS — C3492 Malignant neoplasm of unspecified part of left bronchus or lung: Secondary | ICD-10-CM | POA: Diagnosis not present

## 2023-07-24 DIAGNOSIS — I2782 Chronic pulmonary embolism: Secondary | ICD-10-CM | POA: Diagnosis not present

## 2023-07-24 DIAGNOSIS — F32A Depression, unspecified: Secondary | ICD-10-CM

## 2023-07-24 LAB — CBC
HCT: 30.4 % — ABNORMAL LOW (ref 39.0–52.0)
Hemoglobin: 9.5 g/dL — ABNORMAL LOW (ref 13.0–17.0)
MCH: 29 pg (ref 26.0–34.0)
MCHC: 31.3 g/dL (ref 30.0–36.0)
MCV: 92.7 fL (ref 80.0–100.0)
Platelets: 194 10*3/uL (ref 150–400)
RBC: 3.28 MIL/uL — ABNORMAL LOW (ref 4.22–5.81)
RDW: 19.5 % — ABNORMAL HIGH (ref 11.5–15.5)
WBC: 17.2 10*3/uL — ABNORMAL HIGH (ref 4.0–10.5)
nRBC: 0.1 % (ref 0.0–0.2)

## 2023-07-24 LAB — BASIC METABOLIC PANEL
Anion gap: 9 (ref 5–15)
BUN: 33 mg/dL — ABNORMAL HIGH (ref 8–23)
CO2: 22 mmol/L (ref 22–32)
Calcium: 7.6 mg/dL — ABNORMAL LOW (ref 8.9–10.3)
Chloride: 105 mmol/L (ref 98–111)
Creatinine, Ser: 1.08 mg/dL (ref 0.61–1.24)
GFR, Estimated: >60 mL/min (ref >60)
Glucose, Bld: 83 mg/dL (ref 70–99)
Potassium: 3.3 mmol/L — ABNORMAL LOW (ref 3.5–5.1)
Sodium: 136 mmol/L (ref 135–145)

## 2023-07-24 MED ORDER — LORAZEPAM 1 MG PO TABS
1.0000 mg | ORAL_TABLET | Freq: Four times a day (QID) | ORAL | Status: DC | PRN
  Administered 2023-07-24 – 2023-07-25 (×2): 1 mg via ORAL
  Filled 2023-07-24 (×2): qty 1

## 2023-07-24 MED ORDER — POTASSIUM CHLORIDE 20 MEQ PO PACK
40.0000 meq | PACK | ORAL | Status: AC
  Administered 2023-07-24 (×2): 40 meq via ORAL
  Filled 2023-07-24 (×2): qty 2

## 2023-07-24 MED ORDER — DIPHENHYDRAMINE HCL 25 MG PO CAPS: 25.0000 mg | ORAL_CAPSULE | Freq: Every evening | ORAL | Status: DC | PRN

## 2023-07-24 MED ORDER — POTASSIUM CHLORIDE CRYS ER 20 MEQ PO TBCR: 40.0000 meq | EXTENDED_RELEASE_TABLET | ORAL | Status: DC

## 2023-07-24 MED ORDER — HYDROMORPHONE HCL 1 MG/ML IJ SOLN
1.0000 mg | Freq: Once | INTRAMUSCULAR | Status: AC
  Administered 2023-07-24: 1 mg via INTRAVENOUS
  Filled 2023-07-24: qty 1

## 2023-07-24 MED ORDER — OXYCODONE HCL 5 MG PO TABS
10.0000 mg | ORAL_TABLET | ORAL | Status: DC | PRN
  Administered 2023-07-24 – 2023-07-25 (×6): 10 mg via ORAL
  Filled 2023-07-24 (×6): qty 2

## 2023-07-24 MED ORDER — DIPHENHYDRAMINE HCL 25 MG PO CAPS
25.0000 mg | ORAL_CAPSULE | Freq: Four times a day (QID) | ORAL | Status: DC | PRN
  Administered 2023-07-24 – 2023-07-26 (×3): 25 mg via ORAL
  Filled 2023-07-24 (×3): qty 1

## 2023-07-24 NOTE — Progress Notes (Signed)
Pharmacy Antibiotic Note  Matthew Rivera is a 62 y.o. male admitted on 07/17/2023 with pneumonia. PMH significant for NSCLC with brain metastasis s/p craniotomy and radiation. Patient presented with increased shortness of breath and was recently discharged from a hospitalization for pneumonia on 07/11/23. In ED, patient is afebrile and tachycardic with no leukocytosis. Pharmacy has been consulted for vancomycin and cefepime dosing.  Plan: cefepime 2 g IV every 8 hours vancomycin 1250 mg IV every 24 hours Plan is to complete 8 days  Monitor renal function, clinical status, culture data, and LOT  Height: 5\' 11"  (180.3 cm) Weight: 61.8 kg (136 lb 3.9 oz) IBW/kg (Calculated) : 75.3  Temp (24hrs), Avg:97.8 F (36.6 C), Min:97.4 F (36.3 C), Max:98.4 F (36.9 C)  Recent Labs  Lab 07/17/23 1419 07/18/23 0617 07/18/23 4098 07/18/23 0921 07/19/23 0820 07/21/23 0444 07/22/23 0441 07/22/23 2141 07/24/23 0451  WBC 8.9 8.7  --   --  17.1*  --   --   --  17.2*  CREATININE 0.99 0.86  --   --  0.88 1.00 0.97  --  1.08  LATICACIDVEN 1.7  --  1.4 1.5  --   --   --   --   --   VANCOTROUGH  --   --   --   --   --   --   --  17  --     Estimated Creatinine Clearance: 62 mL/min (by C-G formula based on SCr of 1.08 mg/dL).    No Known Allergies  Antimicrobials this admission: vancomycin 10/06 >>  cefepime 10/06 >>  Flagyl 10/6 >>10/12  Microbiology results: 10/06 Bcx: ng 10/06 MRSA PCR: neg  Thank you for involving pharmacy in this patient's care.   Judeth Cornfield, PharmD Clinical Pharmacist 07/24/2023 10:39 AM

## 2023-07-24 NOTE — Assessment & Plan Note (Signed)
Continue mirtazapine, as needed hydroxyzine and lorazepam.

## 2023-07-24 NOTE — TOC Progression Note (Addendum)
Transition of Care [facility]) - Progression Note    Patient Details  Name: Matthew Rivera MRN: [id number] Date of Birth: 1960/11/16  [facility]) CM/SW Contact  Catalina Gravel, LCSW Phone Number: 07/24/2023, 2:44 PM  Clinical Narrative:    CSW visited family at bedside 10/12 plan is to return home with hospice. Today MD indicated pt had been at 14 LPM of 02- CSW agreed to ask provider how much 02 can be dispensed in home setting.  [place] to Dr. Sharing info from provider. "1 pt can have up to 2 -10 lpm concentrators -so in theory 20 lpm...."  TOC to follow.  Addendum: CSW checked with Maryruth Eve will reach out to family Monday.  Spouse called CSW and updated her that she should expect a call Monday.  TOC to follow.     Barriers to Discharge: Continued Medical Work up  Expected Discharge Plan and Services                                               Social Determinants of Health (SDOH) Interventions SDOH Screenings   Food Insecurity: No Food Insecurity (07/17/2023)  Housing: Low Risk  (07/17/2023)  Transportation Needs: No Transportation Needs (07/17/2023)  Utilities: Not At Risk (07/17/2023)  Alcohol Screen: Low Risk  (05/03/2023)  Depression (PHQ2-9): Low Risk  (05/02/2023)  Tobacco Use: High Risk (07/18/2023)    Readmission Risk Interventions    07/18/2023   12:26 PM 07/12/2023    1:54 PM 06/03/2023    1:54 PM  Readmission Risk Prevention Plan  Transportation Screening Complete Complete Complete  PCP or Specialist Appt within 3-5 Days  Complete Complete  HRI or Home Care Consult  Complete Complete  Social Work Consult for Recovery Care Planning/Counseling  Complete Complete  Palliative Care Screening  Not Applicable Not Complete  Palliative Care Screening Not Complete Comments   MD has orderes palliative for GOC  Medication Review Oceanographer) Complete Complete Complete  HRI or Home Care Consult Complete    SW Recovery Care/Counseling Consult  Complete    Palliative Care Screening Not Applicable    Skilled Nursing Facility Not Applicable

## 2023-07-24 NOTE — Progress Notes (Addendum)
Progress Note   Patient: Matthew Rivera UJW:[id number] DOB: 11-02-1960 DOA: 07/17/2023     7 DOS: the patient was seen and examined on 07/24/2023   Brief hospital course: Mr. Bulman was admitted to the hospital with the working diagnosis of acute on chronic hypoxemic respiratory failure.   62 y.o. male with medical history significant for Stage IV non-small cell lung cancer with brain mets, status post craniotomy and surgical resection on April 07, 2023 under the care of [name], s/p Radiation to the brain cavity by Dr. Carlisle Beers currently on hold due to patient's poor tolerance of chemo despite reduced dose chemo , H/o pulmonary embolism (History of Septic Emboli August 2024) on anticoagulation admitted on 07/17/2023 with acute on chronic hypoxic respiratory failure in the setting of recurrent healthcare associated pneumonia.  Patient was placed on broad spectrum antibiotic therapy and systemic corticosteroids.  High 02 requirements.   10/12 patient with progressive dyspnea and pain, increased 02 requirements to heated high flow nasal cannula. Goals of care addressed, he would like to go home with hospice services.  Change morphine to hydromorphone.  10/13 increase frequency oxycodone. 02 requirements down to 13 L/min per high flow nasal cannula.   Assessment and Plan: * Acute on chronic respiratory failure with hypoxia (HCC) Multifocal pneumonia. (Health care associated pneumonia).   10/06 CT chest with bilateral upper and lower lobes dense ground glass opacities, with traction bronchiectasis and fibrotic changes, predominant at bases, right pleural effusion and compression atelectasis. (My interpretation).  Cavitary lesion right middle lobe 3,0 x 3,4 cm.  Nodular mass posterior right lower lobe 2.4 x 1.4 cm.  Left lower lobe irregular spiculated lesion has decreased in size to 3,7 cm from 4,6 cm.   10/09 chest radiograph with bilateral lower lobes interstitial infiltrates.  Port in place right internal jugular vein.   02 saturation this morning is 99% on 13/L per min per high flow nasal cannular.  Wbc is 17.2  Cultures have been no growth.   Plan to continue antibiotic therapy with cefepime and vancomycin, to complete 8 days.  Continue systemic corticosteroids and bronchodilator therapy.   Patient with poor prognosis, his goal of care is to go home with hospice services If his 02 requirements go down to 10 L/min per high flown nasal cannula he can potentially go home with 02 maximizer device.   Adenosquamous carcinoma of left lung (HCC) Follows with Dr. Arbutus Ped, stage IV lung cancer with brain mass.  S/p craniotomy and surgical resection on April 07, 2023 under the care of [name]. Radiation to the brain cavity by Dr. Basilio Cairo.   Patient has been getting palliative chemotherapy. Carboplatin, Alilmta and Keytruda. (Dose reduced due to intolerance).  Cancer treatment now on hold, considering respiratory failure, his prognosis is very poor.  He has decided to transition to home hospice.   Continue pain control with oxycodone, will increase frequency to q 4 hrs as needed and continue with hydromorphone 2 mg as needed q 2 hrs, will check his requirements prior to using long acting  opiate analgesics.   Anemia due to malignancy. Today hgb is 9,5, no indication for PRBC transfusion.   Pulmonary embolism (HCC) Continue anticoagulation with apixaban.   Hypokalemia Hyponatremia.   Renal function today with serum cr at 1,0 with K at 3,3 and serum bicarbonate at 22. Na 136  Plan to add 40 meq Kcl x2 and follow up renal functio in am.  Patient with poor oral intake.   Protein-calorie malnutrition,  severe Patient with poor oral intake.  Poor prognosis. Continue nutritional supplements.   Pressure injury of skin   Nutrition Documentation    Flowsheet Row ED to Hosp-Admission (Current) from 07/17/2023 in [place] [facility]  Nutrition Problem  Severe Malnutrition  Etiology chronic illness  [lung cancer with brain mets]  Nutrition Goal Patient will meet greater than or equal to 90% of their needs  Interventions Boost Plus, MVI     Present on admission.  Active Pressure Injury/Wound(s)     Pressure Ulcer  Duration          Pressure Injury 07/17/23 Sacrum Stage 1 -  Intact skin with non-blanchable redness of a localized area usually over a bony prominence. 5 days            Rash Patient with superficial wounds, desquamative rash.  Epidermis affected, second degree burn type.  Continue local care. Possible chemotherapy induced or cancer related.  Continue supportive care, considering his advance malignancy and respiratory failure his prognosis is very poor.  Continue pain control and plan to transfer to home hospice at the time of discharge.   Depression Continue mirtazapine, as needed hydroxyzine and lorazepam.    Subjective: Patient reports feeling ok this morning, he reports his pain being manageable this morning, he continue to have poor po intake and dyspnea with minimal efforts.   Physical Exam: Vitals:   07/24/23 0337 07/24/23 0400 07/24/23 0500 07/24/23 0600  BP:  111/74 121/73 120/72  Pulse:  92 96 94  Resp:  16 (!) 26 (!) 22  Temp: 98.4 F (36.9 C)     TempSrc: Axillary     SpO2:  100% 98% 97%  Weight:      Height:       Neurology awake and alert, deconditioned and ill looking appearing ENT with positive pallor Cardiovascular with S1 and S2 present and regular with no gallops, rubs or murmurs Respiratory with bilateral rhonchi and scattered rales with no wheezing Abdomen with no distention  Trace lower extremity edema       Data Reviewed:    Family Communication: no family at the bedside   Disposition: Status is: Inpatient Remains inpatient appropriate because: respiratory failure with high 02 requirements.   Planned Discharge Destination: Home with hospice     Author: Coralie Keens, MD 07/24/2023 8:38 AM  For on call review [email or web address].

## 2023-07-24 NOTE — Progress Notes (Signed)
Pt.asking earlier for RN to change xeroform dressing due to pain. Dressings removed and were found to have already dried out. Pain and areas of weeping,slight bleeding on some areas noted. Care taken to remove without further damaging tissue.   Areas increasing and xeroform being applied to open, weeping or painful tissue.  Dr.Arrien notified and RN asking for wound care guidance.

## 2023-07-24 NOTE — Progress Notes (Addendum)
Dr.Arrien notified of pt's oxygen dropping into 70"s with small PO intake. Pt.verbalizing it was due to pain in mouth and throat causing difficulty swallowing. MD also aware that pt's skin peeling, pain increasing.   Provider notified that pt.verbalizes unrelieved pain unless he is sleeping.

## 2023-07-25 DIAGNOSIS — G9389 Other specified disorders of brain: Secondary | ICD-10-CM | POA: Diagnosis not present

## 2023-07-25 DIAGNOSIS — Z515 Encounter for palliative care: Secondary | ICD-10-CM | POA: Diagnosis not present

## 2023-07-25 DIAGNOSIS — E876 Hypokalemia: Secondary | ICD-10-CM | POA: Diagnosis not present

## 2023-07-25 DIAGNOSIS — E43 Unspecified severe protein-calorie malnutrition: Secondary | ICD-10-CM | POA: Diagnosis not present

## 2023-07-25 DIAGNOSIS — J9621 Acute and chronic respiratory failure with hypoxia: Secondary | ICD-10-CM | POA: Diagnosis not present

## 2023-07-25 DIAGNOSIS — Z7189 Other specified counseling: Secondary | ICD-10-CM | POA: Diagnosis not present

## 2023-07-25 LAB — BASIC METABOLIC PANEL
Anion gap: 9 (ref 5–15)
BUN: 40 mg/dL — ABNORMAL HIGH (ref 8–23)
CO2: 21 mmol/L — ABNORMAL LOW (ref 22–32)
Calcium: 7.7 mg/dL — ABNORMAL LOW (ref 8.9–10.3)
Chloride: 108 mmol/L (ref 98–111)
Creatinine, Ser: 1.25 mg/dL — ABNORMAL HIGH (ref 0.61–1.24)
GFR, Estimated: >60 mL/min (ref >60)
Glucose, Bld: 84 mg/dL (ref 70–99)
Potassium: 3.7 mmol/L (ref 3.5–5.1)
Sodium: 138 mmol/L (ref 135–145)

## 2023-07-25 LAB — CBC
HCT: 30.6 % — ABNORMAL LOW (ref 39.0–52.0)
Hemoglobin: 9.7 g/dL — ABNORMAL LOW (ref 13.0–17.0)
MCH: 30.1 pg (ref 26.0–34.0)
MCHC: 31.7 g/dL (ref 30.0–36.0)
MCV: 95 fL (ref 80.0–100.0)
Platelets: 190 10*3/uL (ref 150–400)
RBC: 3.22 MIL/uL — ABNORMAL LOW (ref 4.22–5.81)
RDW: 20.3 % — ABNORMAL HIGH (ref 11.5–15.5)
WBC: 16.6 10*3/uL — ABNORMAL HIGH (ref 4.0–10.5)
nRBC: 0 % (ref 0.0–0.2)

## 2023-07-25 NOTE — Progress Notes (Signed)
Palliative: Mr. Abascal is lying quietly in bed.  He appears acutely/chronically ill and very frail.  He greets me, making and somewhat keeping eye contact.  He is alert and oriented, able to make his needs known.  His wife, [name], is present at bedside.  We talk about his acute health issue and the treatment plan.  Mr. Neziah, Braley and I talk about the benefits of hospice.  We talk about what is and is not provided both at home and at residential hospice facility.    I return later in the day to continue discussion about hospice care. [name] states they have chosen to go home with the help of hospice.  Bedside nursing staff is present attending needs. Face to face discussion with transition of care team.   Conference with bedside nursing staff and transition of care team related to patient condition, needs, disposition.    Plan:  Home with the benefits of hospice care.          50 minutes  Lillia Carmel, NP Palliative medicine team Team phone [phone]

## 2023-07-25 NOTE — Progress Notes (Signed)
Progress Note   Patient: Matthew Rivera ION:[id number] DOB: 07-09-1961 DOA: 07/17/2023     8 DOS: the patient was seen and examined on 07/25/2023   Brief hospital course: [name] was admitted to the hospital with the working diagnosis of acute on chronic hypoxemic respiratory failure.    62 y.o. male with medical history significant for Stage IV non-small cell lung cancer with brain mets, status post craniotomy and surgical resection on April 07, 2023 under the care of [name], s/p Radiation to the brain cavity by Dr. Carlisle Beers currently on hold due to patient's poor tolerance of chemo despite reduced dose chemo , H/o pulmonary embolism (History of Septic Emboli August 2024) on anticoagulation admitted on 07/17/2023 with acute on chronic hypoxic respiratory failure in the setting of recurrent healthcare associated pneumonia.   Patient was placed on broad spectrum antibiotic therapy and systemic corticosteroids.  High 02 requirements.    10/12 patient with progressive dyspnea and pain, increased 02 requirements to heated high flow nasal cannula. Goals of care addressed, he would like to go home with hospice services.  Change morphine to hydromorphone.  10/13 increase frequency oxycodone. 02 requirements down to 13 L/min per high flow nasal cannula.   10/14: Requiring around 11 L high flow nasal cannula supplementation.  Reporting no significant chest pain, nausea or vomiting.  Chronically ill in appearance, experiencing intermittent episode of restlessness and crying spells.  Would like to go home with family and hospice.  Assessment and Plan: * Acute on chronic respiratory failure with hypoxia (HCC) Multifocal pneumonia. (Health care associated pneumonia).   10/06 CT chest with bilateral upper and lower lobes dense ground glass opacities, with traction bronchiectasis and fibrotic changes, predominant at bases, right pleural effusion and compression atelectasis. (My  interpretation).  Cavitary lesion right middle lobe 3,0 x 3,4 cm.  Nodular mass posterior right lower lobe 2.4 x 1.4 cm.  Left lower lobe irregular spiculated lesion has decreased in size to 3,7 cm from 4,6 cm.   10/09 chest radiograph with bilateral lower lobes interstitial infiltrates. Port in place right internal jugular vein.   02 saturation this morning is 99% on 13/L per min per high flow nasal cannular.  Wbc is 17.2  Cultures have been no growth.   Plan to continue antibiotic therapy with cefepime and vancomycin, to complete 8 days.  Continue systemic corticosteroids and bronchodilator therapy.   Patient with poor prognosis, his goal of care is to go home with hospice services If his 02 requirements go down to 10 L/min per high flown nasal cannula he can potentially go home with 02 maximizer device.   Adenosquamous carcinoma of left lung (HCC) Follows with Dr. Arbutus Ped, stage IV lung cancer with brain mass.  S/p craniotomy and surgical resection on April 07, 2023 under the care of [name]. Radiation to the brain cavity by Dr. Basilio Cairo.   Patient has been getting palliative chemotherapy. Carboplatin, Alilmta and Keytruda. (Dose reduced due to intolerance).  Cancer treatment now on hold, considering respiratory failure, his prognosis is very poor.  He has decided to transition to home hospice.   Continue pain control with oxycodone, will increase frequency to q 4 hrs as needed and continue with hydromorphone 2 mg as needed q 2 hrs, will check his requirements prior to using long acting  opiate analgesics.   Anemia due to malignancy. Today hgb is 9,5, no indication for PRBC transfusion.   Pulmonary embolism (HCC) Continue anticoagulation with apixaban.   Hypokalemia Hyponatremia.  Renal function today with serum cr at 1,0 with K at 3,3 and serum bicarbonate at 22. Na 136  Plan to add 40 meq Kcl x2 and follow up renal functio in am.  Patient with poor oral intake.    Protein-calorie malnutrition, severe Patient with poor oral intake.  Poor prognosis. Continue nutritional supplements.   Pressure injury of skin   Nutrition Documentation    Flowsheet Row ED to Hosp-Admission (Current) from 07/17/2023 in [place] [facility]  Nutrition Problem Severe Malnutrition  Etiology chronic illness  [lung cancer with brain mets]  Nutrition Goal Patient will meet greater than or equal to 90% of their needs  Interventions Boost Plus, MVI     Present on admission.  Active Pressure Injury/Wound(s)     Pressure Ulcer  Duration          Pressure Injury 07/17/23 Sacrum Stage 1 -  Intact skin with non-blanchable redness of a localized area usually over a bony prominence. 5 days            Rash Patient with superficial wounds, desquamative rash.  Epidermis affected, second degree burn type.  Continue local care. Possible chemotherapy induced or cancer related.  Continue supportive care, considering his advance malignancy and respiratory failure his prognosis is very poor.  Continue pain control and plan to transfer to home hospice at the time of discharge.   Depression Continue mirtazapine, as needed hydroxyzine and lorazepam.    Subjective:  Afebrile, no chest pain, no nausea vomiting.  Still complaining of intermittent dyspnea, skin itching requiring high level of oxygen supplementation.  Physical Exam: Vitals:   07/25/23 1200 07/25/23 1238 07/25/23 1300 07/25/23 1623  BP: 127/82  126/77   Pulse: 98  (!) 114   Resp: (!) 28  (!) 27   Temp:  98.4 F (36.9 C)  98.4 F (36.9 C)  TempSrc:  Axillary  Axillary  SpO2: 100%  100%   Weight:      Height:       General: Currently denying chest pain; no nausea or vomiting.  Demonstrating good saturation on about 11 L high flow nasal cannula supplementation. Respiratory: Positive bilateral rhonchi; mild intermittent tachypnea appreciated.  No wheezing and no crackles. Cardiovascular: Rate  controlled, no rubs, no gallops, no JVD on exam. Abdomen: Soft, no distention, positive bowel sounds. Extremities: Trace pedal edema appreciated; no cyanosis or clubbing. Skin: See pictures below.  Patient with generalized desquamation/diffuse rash of multiple scabs. Neurology: No focal deficits.       Data Reviewed:    Family Communication: no family at the bedside   Disposition: Status is: Inpatient  Remains inpatient appropriate because: respiratory failure with high 02 requirements.    Planned Discharge Destination: Home with hospice   Author: Vassie Loll, MD 07/25/2023 5:15 PM  For on call review [email or web address].

## 2023-07-26 DIAGNOSIS — J9621 Acute and chronic respiratory failure with hypoxia: Secondary | ICD-10-CM | POA: Diagnosis not present

## 2023-07-26 DIAGNOSIS — L89152 Pressure ulcer of sacral region, stage 2: Secondary | ICD-10-CM

## 2023-07-26 DIAGNOSIS — E876 Hypokalemia: Secondary | ICD-10-CM | POA: Diagnosis not present

## 2023-07-26 DIAGNOSIS — Z7189 Other specified counseling: Secondary | ICD-10-CM | POA: Diagnosis not present

## 2023-07-26 DIAGNOSIS — F32A Depression, unspecified: Secondary | ICD-10-CM | POA: Diagnosis not present

## 2023-07-26 DIAGNOSIS — I2782 Chronic pulmonary embolism: Secondary | ICD-10-CM | POA: Diagnosis not present

## 2023-07-26 DIAGNOSIS — Z515 Encounter for palliative care: Secondary | ICD-10-CM | POA: Diagnosis not present

## 2023-07-26 MED ORDER — ACETAMINOPHEN 325 MG PO TABS: 650.0000 mg | ORAL_TABLET | Freq: Four times a day (QID) | ORAL | Status: DC | PRN

## 2023-07-26 MED ORDER — MIRTAZAPINE 7.5 MG PO TABS: 7.5000 mg | ORAL_TABLET | Freq: Every day | ORAL | 0 refills | Status: DC

## 2023-07-26 MED ORDER — SCOPOLAMINE 1 MG/3DAYS TD PT72: 1.0000 | MEDICATED_PATCH | TRANSDERMAL | 12 refills | Status: DC

## 2023-07-26 MED ORDER — APIXABAN 5 MG PO TABS: 5.0000 mg | ORAL_TABLET | Freq: Two times a day (BID) | ORAL | Status: DC

## 2023-07-26 MED ORDER — LIDOCAINE VISCOUS HCL 2 % MT SOLN
15.0000 mL | Freq: Four times a day (QID) | OROMUCOSAL | Status: DC | PRN
  Administered 2023-07-26: 15 mL via OROMUCOSAL
  Filled 2023-07-26 (×2): qty 15

## 2023-07-26 MED ORDER — LORAZEPAM 1 MG PO TABS: 1.0000 mg | ORAL_TABLET | Freq: Four times a day (QID) | ORAL | 0 refills | Status: DC | PRN

## 2023-07-26 MED ORDER — MORPHINE SULFATE (CONCENTRATE) 20 MG/ML PO SOLN: 5.0000 mg | ORAL | 0 refills | Status: DC | PRN

## 2023-07-26 MED ORDER — POLYETHYLENE GLYCOL 3350 17 G PO PACK: 17.0000 g | PACK | Freq: Every day | ORAL | 0 refills | Status: DC | PRN

## 2023-07-26 MED ORDER — PREDNISONE 10 MG PO TABS: 20.0000 mg | ORAL_TABLET | Freq: Every day | ORAL | 0 refills | Status: DC

## 2023-07-26 MED ORDER — OXYCODONE HCL 10 MG PO TABS: 10.0000 mg | ORAL_TABLET | ORAL | 0 refills | Status: DC | PRN

## 2023-07-26 MED FILL — Dexamethasone Sodium Phosphate Inj 100 MG/10ML: INTRAMUSCULAR | Qty: 1 | Status: AC

## 2023-07-26 MED FILL — Fosaprepitant Dimeglumine For IV Infusion 150 MG (Base Eq): INTRAVENOUS | Qty: 5 | Status: AC

## 2023-07-26 NOTE — TOC Progression Note (Signed)
Transition of Care [facility]) - Progression Note    Patient Details  Name: SAHAND GOSCH MRN: [id number] Date of Birth: 1961/01/20  Transition of [facility]) CM/SW Contact  V[place] Her[place], [place] Phone Number: 07/26/2023, 3:29 PM  Clinical Narrative:    CSW updated that hospital bed will delivery to pts home shortly. Pts home O2 has been set up. CSW spoke with pts wife and she will be ready for pts return tomorrow morning. [facility] will have an RN to the home tomorrow once pt discharges. TOC to follow.     Barriers to Discharge: Continued Medical Work up  Expected Discharge Plan and Services         Expected Discharge Date: 07/26/23                                     Social Determinants of Health (SDOH) Interventions SDOH Screenings   Food Insecurity: No Food Insecurity (07/17/2023)  Housing: Low Risk  (07/17/2023)  Transportation Needs: No Transportation Needs (07/17/2023)  Utilities: Not At Risk (07/17/2023)  Alcohol Screen: Low Risk  (05/03/2023)  Depression (PHQ2-9): Low Risk  (05/02/2023)  Tobacco Use: High Risk (07/18/2023)    Readmission Risk Interventions    07/18/2023   12:26 PM 07/12/2023    1:54 PM 06/03/2023    1:54 PM  Readmission Risk Prevention Plan  Transportation Screening Complete Complete Complete  PCP or Specialist Appt within 3-5 Days  Complete Complete  HRI or Home Care Consult  Complete Complete  Social Work Consult for Recovery Care Planning/Counseling  Complete Complete  Palliative Care Screening  Not Applicable Not Complete  Palliative Care Screening Not Complete Comments   MD has orderes palliative for GOC  Medication Review [employer]) Complete Complete Complete  HRI or Home Care Consult Complete    SW Recovery Care/Counseling Consult Complete    Palliative Care Screening Not Applicable    Skilled Nursing Facility Not Applicable

## 2023-07-26 NOTE — Discharge Summary (Signed)
Physician Discharge Summary   Patient: Matthew Rivera MRN: [id number] DOB: [birth year]  Admit date:     07/17/2023  Discharge date: 07/27/23  Discharge Physician: Vassie Loll   PCP: [name], MD   Recommendations at discharge:  -Outpatient follow-up with hospice service -No future hospitalization -Symptomatic management -Comfort feeding.  Discharge Diagnoses: Principal Problem:   Acute on chronic respiratory failure with hypoxia (HCC) Active Problems:   Adenosquamous carcinoma of left lung (HCC)   Pulmonary embolism (HCC)   Hypokalemia   Protein-calorie malnutrition, severe   Pressure injury of skin   Rash   Depression  Brief hospital course: Mr. Berney was admitted to the hospital with the working diagnosis of acute on chronic hypoxemic respiratory failure.    62 y.o. male with medical history significant for Stage IV non-small cell lung cancer with brain mets, status post craniotomy and surgical resection on April 07, 2023 under the care of [name], s/p Radiation to the brain cavity by Dr. Carlisle Beers currently on hold due to patient's poor tolerance of chemo despite reduced dose chemo , H/o pulmonary embolism (History of Septic Emboli August 2024) on anticoagulation admitted on 07/17/2023 with acute on chronic hypoxic respiratory failure in the setting of recurrent healthcare associated pneumonia.   Patient was placed on broad spectrum antibiotic therapy and systemic corticosteroids.  High 02 requirements.    10/12 patient with progressive dyspnea and pain, increased 02 requirements to heated high flow nasal cannula. Goals of care addressed, he would like to go home with hospice services.  Change morphine to hydromorphone.  10/13 increase frequency oxycodone. 02 requirements down to 13 L/min per high flow nasal cannula.    10/14: Requiring around 11 L high flow nasal cannula supplementation.  Reporting no significant chest pain, nausea or vomiting.   Chronically ill in appearance, experiencing intermittent episode of restlessness and crying spells.  Would like to go home with family and hospice.  Assessment and Plan:  Acute on chronic respiratory failure with hypoxia (HCC) Multifocal pneumonia. (Health care associated pneumonia).    10/06 CT chest with bilateral upper and lower lobes dense ground glass opacities, with traction bronchiectasis and fibrotic changes, predominant at bases, right pleural effusion and compression atelectasis. (My interpretation).  Cavitary lesion right middle lobe 3,0 x 3,4 cm.  Nodular mass posterior right lower lobe 2.4 x 1.4 cm.  Left lower lobe irregular spiculated lesion has decreased in size to 3,7 cm from 4,6 cm.    10/09 chest radiograph with bilateral lower lobes interstitial infiltrates. Port in place right internal jugular vein.    02 saturation this morning is 99% on 13/L per min per high flow nasal cannular.  Wbc is 17.2  Cultures have been no growth.    Plan to continue antibiotic therapy with cefepime and vancomycin, to complete 8 days.  Continue systemic corticosteroids and bronchodilator therapy.    Patient with very poor prognosis, his goal of care is to go home with hospice services If his 02 requirements go down to 10 L/min per high flown nasal cannula he can potentially go home with 02 maximizer device.    Adenosquamous carcinoma of left lung (HCC) Follows with Dr. Arbutus Ped, stage IV lung cancer with brain mass.  S/p craniotomy and surgical resection on April 07, 2023 under the care of [name]. Radiation to the brain cavity by Dr. Basilio Cairo.    Patient has been getting palliative chemotherapy. Carboplatin, Alilmta and Keytruda. (Dose reduced due to intolerance).  Cancer treatment now on  hold, considering respiratory failure, his prognosis is very poor.  He has decided to transition to home hospice.    Continue pain control with oxycodone, Roxanol and as needed Ativan -Follow-up by  hospice at home for further determination regarding need for long-acting opiates therapy.  -Continue scopolamine patch.   Anemia due to malignancy. Today hgb is 9,5, no indication for PRBC transfusion.    Pulmonary embolism (HCC) -Continue anticoagulation with apixaban.    Hypokalemia/hyponatremia -Electrolytes repleted and for the most part stabilized prior to discharge. -With anticipated plans for home hospice and symptomatic management only.  Protein-calorie malnutrition, severe Patient with poor oral intake.  Poor prognosis. Continue nutritional supplements.    Pressure injury of skin   Nutrition Documentation     Flowsheet Row ED to Hosp-Admission (Current) from 07/17/2023 in [place] [facility]  Nutrition Problem Severe Malnutrition  Etiology chronic illness  [lung cancer with brain mets]  Nutrition Goal Patient will meet greater than or equal to 90% of their needs  Interventions Boost Plus, MVI       Present on admission.  Active Pressure Injury/Wound(s)       Pressure Ulcer  Duration             Pressure Injury 07/17/23 Sacrum Stage 1 -  Intact skin with non-blanchable redness of a localized area usually over a bony prominence. 5 days                  Rash Patient with superficial wounds, desquamative rash.  Epidermis affected, second degree burn type.  Continue local care. Possible chemotherapy induced or cancer related.  Continue supportive care, considering his advance malignancy and respiratory failure his prognosis is very poor.  Continue pain control and plan to transfer to home hospice at the time of discharge.    Depression Continue mirtazapine, as needed hydroxyzine and lorazepam.  Medication to be further adjusted by hospice service.  Consultants: Palliative care, hospice, critical care/pulmonologist, nutritional service and wound care service. Procedures performed: See low for x-ray reports. Disposition: Hospice care Diet  recommendation:  Comfort feeding.  DISCHARGE MEDICATION: Allergies as of 07/26/2023   No Known Allergies      Medication List     STOP taking these medications    amoxicillin-clavulanate 875-125 MG tablet Commonly known as: AUGMENTIN   ferrous sulfate 325 (65 FE) MG tablet   folic acid 1 MG tablet Commonly known as: FOLVITE   loratadine 10 MG tablet Commonly known as: CLARITIN   multivitamin with minerals Tabs tablet   traMADol 50 MG tablet Commonly known as: ULTRAM       TAKE these medications    acetaminophen 325 MG tablet Commonly known as: TYLENOL Take 2 tablets (650 mg total) by mouth every 6 (six) hours as needed for mild pain (pain score 1-3) (or Fever >/= 101). What changed:  medication strength how much to take reasons to take this   albuterol 108 (90 Base) MCG/ACT inhaler Commonly known as: VENTOLIN HFA Inhale 2 puffs into the lungs every 4 (four) hours as needed for wheezing or shortness of breath.   apixaban 5 MG Tabs tablet Commonly known as: ELIQUIS Take 1 tablet (5 mg total) by mouth 2 (two) times daily.   feeding supplement Liqd Take 237 mLs by mouth 3 (three) times daily between meals.   fluticasone 50 MCG/ACT nasal spray Commonly known as: FLONASE Place 2 sprays into both nostrils daily.   lidocaine-prilocaine cream Commonly known as: EMLA Apply 1  Application topically as needed. What changed: reasons to take this   LORazepam 1 MG tablet Commonly known as: ATIVAN Take 1 tablet (1 mg total) by mouth every 6 (six) hours as needed for anxiety.   mirtazapine 7.5 MG tablet Commonly known as: REMERON Take 1 tablet (7.5 mg total) by mouth at bedtime.   morphine 20 MG/ML concentrated solution Commonly known as: ROXANOL Take 0.25-0.5 mLs (5-10 mg total) by mouth every 2 (two) hours as needed for severe pain (pain score 7-10), shortness of breath or moderate pain (pain score 4-6) (comfort).   ondansetron 4 MG tablet Commonly known  as: Zofran Take 1 tablet (4 mg total) by mouth every 8 (eight) hours as needed for nausea or vomiting.   Oxycodone HCl 10 MG Tabs Take 1 tablet (10 mg total) by mouth every 4 (four) hours as needed (for pain).   pantoprazole 40 MG tablet Commonly known as: PROTONIX Take 1 tablet (40 mg total) by mouth 2 (two) times daily. What changed: when to take this   polyethylene glycol 17 g packet Commonly known as: MIRALAX / GLYCOLAX Take 17 g by mouth daily as needed for mild constipation.   predniSONE 10 MG tablet Commonly known as: DELTASONE Take 2 tablets (20 mg total) by mouth daily.   prochlorperazine 10 MG tablet Commonly known as: COMPAZINE Take 1 tablet (10 mg total) by mouth every 6 (six) hours as needed for nausea or vomiting.   scopolamine 1 MG/3DAYS Commonly known as: TRANSDERM-SCOP Place 1 patch (1.5 mg total) onto the skin every 3 (three) days.   Stiolto Respimat 2.5-2.5 MCG/ACT Aers Generic drug: Tiotropium Bromide-Olodaterol Inhale 2 puffs into the lungs daily.   tamsulosin 0.4 MG Caps capsule Commonly known as: FLOMAX Take 0.4 mg by mouth at bedtime as needed (urinary flow issues).               Discharge Care Instructions  (From admission, onward)           Start     Ordered   07/26/23 0000  Discharge wound care:       Comments: Keep area clean and dry; constant repositioning and the use of single-layer Xeroform gauze to cover areas where skin is loss.   07/26/23 1558            Follow-up Information     [name], MD. Schedule an appointment as soon as possible for a visit in 10 day(s).   Specialty: Internal Medicine Why: patient also actively follow by hospice service at discharge. Contact information: [address] [place] [address] [phone]                Discharge Exam: Filed Weights   07/19/23 0500 07/20/23 0500 07/26/23 0506  Weight: 61.2 kg 61.8 kg 58.2 kg   General exam: Alert, awake, oriented x  3; chronically ill in appearance.  Comfortable. Respiratory system: Mild intermittent tachypnea: Positive rhonchi.  No wheezing or crackles.  Good saturation on 8-9 L high flow nasal colostomy mentation. Cardiovascular system: No rubs, no gallops, no JVD. Gastrointestinal system: Abdomen is nondistended, soft and nontender. No organomegaly or masses felt. Normal bowel sounds heard. Central nervous system: Moving 4 limbs spontaneously.  No focal neurological deficits. Extremities: No cyanosis or clubbing; trace edema appreciated bilaterally. Skin: No petechiae appreciated; diffuse erythematous rash and desquamation (similar to burning type of wound) present at time of admission, with some weeping appreciated.  No signs of superimposed infection at the moment. Psychiatry: Judgement  and insight appear normal.  Flat affect appreciated.   Condition at discharge: Stable and improved.  The results of significant diagnostics from this hospitalization (including imaging, microbiology, ancillary and laboratory) are listed below for reference.   Imaging Studies: DG CHEST PORT 1 VIEW  Result Date: 07/20/2023 CLINICAL DATA:  SOB (shortness of breath) EXAM: PORTABLE CHEST 1 VIEW COMPARISON:  CT of the chest and chest radiograph October 6, 24. FINDINGS: No substantial change in bilateral interstitial and airspace opacities. No visible pneumothorax. Cardiomediastinal silhouette is unchanged. Stable location of a right IJ approach Port-A-Cath. No evidence of acute bony abnormality. IMPRESSION: No substantial change in bilateral interstitial and airspace opacities, further characterized on recent CT chest. Electronically Signed   By: Feliberto Harts M.D.   On: 07/20/2023 11:31   [place] CHEST (PLEURAL EFFUSION)  Result Date: 07/18/2023 CLINICAL DATA:  Bilateral pleural effusions. EXAM: CHEST ULTRASOUND COMPARISON:  CT scan of July 17, 2023. FINDINGS: Small bilateral pleural effusions are noted with associated  atelectasis of both lungs. No adequate fluid pocket for thoracentesis is noted. IMPRESSION: Small bilateral pleural effusions with associated atelectasis of both lower lobes. No adequate fluid pocket for thoracentesis. Electronically Signed   By: Lupita Raider M.D.   On: 07/18/2023 11:32   CT CHEST WO CONTRAST  Result Date: 07/17/2023 CLINICAL DATA:  Worsening dyspnea, cough, recent treatment of pneumonia. History of non-small cell lung cancer of the left lung. Last chemotherapy on Monday. EXAM: CT CHEST WITHOUT CONTRAST TECHNIQUE: Multidetector CT imaging of the chest was performed following the standard protocol without IV contrast. RADIATION DOSE REDUCTION: This exam was performed according to the departmental dose-optimization program which includes automated exposure control, adjustment of the mA and/or kV according to patient size and/or use of iterative reconstruction technique. COMPARISON:  Chest radiograph earlier today and CTA chest 06/01/2023 FINDINGS: Cardiovascular: Normal heart size. Trace pericardial effusion. Coronary artery and aortic atherosclerotic calcification. Right chest wall port. Mediastinum/Nodes: Wispy secretions in the trachea extending into the right mainstem bronchus. Unremarkable esophagus. Unchanged mediastinal adenopathy. For example 1.3 cm AP window node on series 2/image 64 and 1.7 cm subcarinal node on 2/87. Lungs/Pleura: New moderate right pleural effusion and small left pleural effusion. Increased diffuse ground-glass and reticular opacities with architectural distortion and bronchiectasis/bronchiolectasis greatest in the lower lungs. Additional increased peribronchovascular consolidation in the bilateral lower lobes and right middle lobe. New thick walled cavitary lesion in the right middle lobe measuring 3.0 x 3.4 cm on series 4/image 92. Slightly increased size of the nodular mass in the posterior right lower lobe measuring 2.4 x 1.4 cm (series 4/image 120, previously  2.2 x 1.3 cm. This now demonstrates suggestion of early central cavitation. Additional cavitary lesions are similar or decreased. For example the cavitary lesion in the central left upper lobe on series 4/image 58 now measuring 1.3 cm in greatest dimension, previously 1.9 cm. The irregular spiculated nodule in the left lower lobe has decreased in size now measuring 3.7 cm in greatest dimension, previously 4.6 cm. Upper Abdomen: Cholelithiasis.  No acute abnormality. Musculoskeletal: No acute fracture or destructive osseous lesion. IMPRESSION: 1. Constellation of findings favored to represent worsening multifocal pneumonia superimposed on a background of interstitial lung disease. 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 2. There are a few new or increased cavitary lesions for example in the right middle lobe. Some of the previously seen cavitary lesions are stable or decreased in size. 3. Increased size of the posterior right lower lobe nodule  new central cavitation. Decreased size of the irregular spiculated nodule in the left lower lobe 4. New moderate right pleural effusion and small left pleural effusion. 5. Unchanged mediastinal adenopathy. Aortic Atherosclerosis (ICD10-I70.0). Electronically Signed   By: Minerva Fester M.D.   On: 07/17/2023 20:17   DG Chest Port 1 View  Result Date: 07/17/2023 CLINICAL DATA:  Shortness of breath. History of stage IV lung cancer and recent pneumonia. EXAM: PORTABLE CHEST 1 VIEW COMPARISON:  07/11/2023 FINDINGS: Right Port-A-Cath unchanged. Midline trachea. Normal heart size. Small volume right-sided pleural fluid and/or thickening especially laterally, similar. No left-sided pleural fluid. No pneumothorax. Minimally improved right mid and lower lung interstitial and airspace disease. Left mid and lower lung interstitial and airspace disease is similar. IMPRESSION: Minimally improved right base aeration. Otherwise, similar appearance of  bilateral lower lung predominant interstitial and airspace disease. In this patient with a history of lung cancer, this could represent pneumonia, lymphangitic tumor spread and/or treatment effects. Infectious process favored on 06/01/2023 CT. Electronically Signed   By: Jeronimo Greaves M.D.   On: 07/17/2023 16:10   DG Chest Portable 1 View  Result Date: 07/11/2023 CLINICAL DATA:  Cough.  Recent pneumonia. EXAM: PORTABLE CHEST 1 VIEW COMPARISON:  AP chest 06/01/2023, 01/15/2011; CT chest 06/01/2023 FINDINGS: Right chest wall porta catheter with tip overlying the mid superior vena cava, unchanged from 06/01/2023. Cardiac silhouette and mediastinal contours are within normal limits. Mild interval increase in density of heterogeneous airspace opacification throughout the inferior 40% of the right lung. Mildly increased density of lower density heterogeneous airspace opacity throughout the inferior 50% of the left lung. This likely represents worsening of a similar distribution multifocal pneumonia seen on prior 06/01/2023 radiograph and CT. No large pleural effusion is seen.  No pneumothorax. No acute skeletal abnormality. IMPRESSION: Mild interval increase in density of heterogeneous airspace opacification throughout the inferior 40% of the right lung and inferior 50% of the left lung. This likely represents worsening of a similar distribution multifocal pneumonia seen on prior 06/01/2023 radiograph and CT. Electronically Signed   By: Neita Garnet M.D.   On: 07/11/2023 15:38   DG Hand 2 View Right  Result Date: 07/11/2023 CLINICAL DATA:  Right hand swelling and pain. EXAM: RIGHT HAND - 2 VIEW COMPARISON:  None Available. FINDINGS: Pulse oximeter overlies the distal aspect of the index finger, obscuring the bones on frontal view. Normal alignment. Neutral ulnar variance. Mild dorsal carpometacarpal degenerative spurring, likely at the third digit. No acute fracture or dislocation. IMPRESSION: Mild dorsal  carpometacarpal degenerative spurring, likely at the third digit. Electronically Signed   By: Neita Garnet M.D.   On: 07/11/2023 15:34   DG Ankle 2 Views Right  Result Date: 07/11/2023 CLINICAL DATA:  Right foot swelling and pain. EXAM: RIGHT ANKLE - 2 VIEW COMPARISON:  Right ankle radiographs 02/11/2021 FINDINGS: Interval lateral plate and screw fixation of the prior distal fibular diaphyseal fracture with improved, now anatomic alignment complete healing. Single screw fixation of the prior medial malleolar fracture, with normal alignment and complete healing. 3 mm chronic ossicle medial to the medial malleolus. The ankle mortise is symmetric and intact. No acute fracture is seen. No dislocation. IMPRESSION: Compared to 02/11/2021: 1. Interval ORIF of the prior distal fibular diaphyseal and medial malleolar fractures with improved, now anatomic alignment and complete healing. 2. No acute fracture. Electronically Signed   By: Neita Garnet M.D.   On: 07/11/2023 15:32    Microbiology: Results for orders placed or performed during  the hospital encounter of 07/17/23  Blood culture (routine x 2)     Status: None   Collection Time: 07/17/23  2:19 PM   Specimen: Right Antecubital; Blood  Result Value Ref Range Status   Specimen Description   Final    RIGHT ANTECUBITAL BOTTLES DRAWN AEROBIC AND ANAEROBIC   Special Requests Blood Culture adequate volume  Final   Culture   Final    NO GROWTH 5 DAYS Performed at [facility], [address], [place], [place] [address]    Report Status 07/22/2023 FINAL  Final  Resp panel by RT-PCR (RSV, Flu A&B, Covid) Anterior Nasal Swab     Status: None   Collection Time: 07/17/23  2:19 PM   Specimen: Anterior Nasal Swab  Result Value Ref Range Status   SARS Coronavirus 2 by RT PCR NEGATIVE NEGATIVE Final    Comment: (NOTE) SARS-CoV-2 target nucleic acids are NOT DETECTED.  The SARS-CoV-2 RNA is generally detectable in upper respiratory specimens during the acute  phase of infection. The lowest concentration of SARS-CoV-2 viral copies this assay can detect is 138 copies/mL. A negative result does not preclude SARS-Cov-2 infection and should not be used as the sole basis for treatment or other patient management decisions. A negative result may occur with  improper specimen collection/handling, submission of specimen other than nasopharyngeal swab, presence of viral mutation(s) within the areas targeted by this assay, and inadequate number of viral copies(<138 copies/mL). A negative result must be combined with clinical observations, patient history, and epidemiological information. The expected result is Negative.  Fact Sheet for Patients:  BloggerCourse.com  Fact Sheet for Healthcare Providers:  SeriousBroker.it  This test is no t yet approved or cleared by the [place] FDA and  has been authorized for detection and/or diagnosis of SARS-CoV-2 by FDA under an Emergency Use Authorization (EUA). This EUA will remain  in effect (meaning this test can be used) for the duration of the COVID-19 declaration under Section 564(b)(1) of the Act, 21 U.S.C.section 360bbb-3(b)(1), unless the authorization is terminated  or revoked sooner.       Influenza A by PCR NEGATIVE NEGATIVE Final   Influenza B by PCR NEGATIVE NEGATIVE Final    Comment: (NOTE) The Xpert Xpress SARS-CoV-2/FLU/RSV plus assay is intended as an aid in the diagnosis of influenza from Nasopharyngeal swab specimens and should not be used as a sole basis for treatment. Nasal washings and aspirates are unacceptable for Xpert Xpress SARS-CoV-2/FLU/RSV testing.  Fact Sheet for Patients: BloggerCourse.com  Fact Sheet for Healthcare Providers: SeriousBroker.it  This test is not yet approved or cleared by the [place] FDA and has been authorized for detection and/or diagnosis of  SARS-CoV-2 by FDA under an Emergency Use Authorization (EUA). This EUA will remain in effect (meaning this test can be used) for the duration of the COVID-19 declaration under Section 564(b)(1) of the Act, 21 U.S.C. section 360bbb-3(b)(1), unless the authorization is terminated or revoked.     Resp Syncytial Virus by PCR NEGATIVE NEGATIVE Final    Comment: (NOTE) Fact Sheet for Patients: BloggerCourse.com  Fact Sheet for Healthcare Providers: SeriousBroker.it  This test is not yet approved or cleared by the [place] FDA and has been authorized for detection and/or diagnosis of SARS-CoV-2 by FDA under an Emergency Use Authorization (EUA). This EUA will remain in effect (meaning this test can be used) for the duration of the COVID-19 declaration under Section 564(b)(1) of the Act, 21 U.S.C. section 360bbb-3(b)(1), unless the authorization is  terminated or revoked.  Performed at [facility], [address] [place], [place] [address]   Blood culture (routine x 2)     Status: None   Collection Time: 07/17/23  2:34 PM   Specimen: BLOOD RIGHT HAND  Result Value Ref Range Status   Specimen Description   Final    BLOOD RIGHT HAND BOTTLES DRAWN AEROBIC AND ANAEROBIC   Special Requests   Final    Blood Culture results may not be optimal due to an excessive volume of blood received in culture bottles   Culture   Final    NO GROWTH 5 DAYS Performed at [facility], [address], [place], [place] [address]    Report Status 07/22/2023 FINAL  Final  MRSA Next Gen by PCR, Nasal     Status: None   Collection Time: 07/18/23  7:00 AM   Specimen: Nasal Mucosa; Nasal Swab  Result Value Ref Range Status   MRSA by PCR Next Gen NOT DETECTED NOT DETECTED Final    Comment: (NOTE) The GeneXpert MRSA Assay (FDA approved for NASAL specimens only), is one component of a comprehensive MRSA colonization surveillance program. It is not intended to  diagnose MRSA infection nor to guide or monitor treatment for MRSA infections. Test performance is not FDA approved in patients less than 20 years old. Performed at [facility], [address], [place], [place] [address]     Labs: CBC: Recent Labs  Lab 07/24/23 0451 07/25/23 0455  WBC 17.2* 16.6*  HGB 9.5* 9.7*  HCT 30.4* 30.6*  MCV 92.7 95.0  PLT 194 190   Basic Metabolic Panel: Recent Labs  Lab 07/21/23 0444 07/22/23 0441 07/24/23 0451 07/25/23 0455  NA 131*  --  136 138  K 3.4*  --  3.3* 3.7  CL 102  --  105 108  CO2 22  --  22 21*  GLUCOSE 90  --  83 84  BUN 27*  --  33* 40*  CREATININE 1.00 0.97 1.08 1.25*  CALCIUM 7.4*  --  7.6* 7.7*   CBG: Recent Labs  Lab 07/22/23 0010 07/22/23 0555 07/22/23 1158 07/22/23 1726 07/23/23 1222  GLUCAP 111* 88 116* 113* 106*    Discharge time spent: greater than 30 minutes.  Signed: Vassie Loll, MD [facility] 07/26/2023

## 2023-07-26 NOTE — Progress Notes (Signed)
Attempted to call EMS to schedule pick up time for tomorrow morning. Unfortunately no one answered, will try back again later.

## 2023-07-26 NOTE — Plan of Care (Signed)
Problem: Education: Goal: Knowledge of the prescribed therapeutic regimen will improve Outcome: Progressing   Problem: Clinical Measurements: Goal: Usual level of consciousness will be regained or maintained. Outcome: Progressing Goal: Neurologic status will improve Outcome: Progressing Goal: Ability to maintain intracranial pressure will improve Outcome: Progressing   Problem: Skin Integrity: Goal: Demonstration of wound healing without infection will improve Outcome: Progressing

## 2023-07-26 NOTE — Progress Notes (Signed)
Nurse called [facility] transportation to set up transportation for pt. To be discharged tomorrow, however, there was no answer. Nurse left a voicemail with a return contact phone number for a call back.

## 2023-07-26 NOTE — Plan of Care (Signed)
  Problem: Education: Goal: Knowledge of the prescribed therapeutic regimen will improve Outcome: Progressing   Problem: Clinical Measurements: Goal: Usual level of consciousness will be regained or maintained. Outcome: Progressing Goal: Neurologic status will improve Outcome: Progressing Goal: Ability to maintain intracranial pressure will improve Outcome: Progressing   Problem: Skin Integrity: Goal: Demonstration of wound healing without infection will improve Outcome: Not Progressing   Problem: Activity: Goal: Ability to tolerate increased activity will improve Outcome: Not Progressing   Problem: Clinical Measurements: Goal: Ability to maintain a body temperature in the normal range will improve Outcome: Progressing   Problem: Respiratory: Goal: Ability to maintain adequate ventilation will improve Outcome: Progressing Goal: Ability to maintain a clear airway will improve Outcome: Progressing   Problem: Health Behavior/Discharge Planning: Goal: Ability to manage health-related needs will improve Outcome: Progressing   Problem: Nutrition: Goal: Adequate nutrition will be maintained Outcome: Not Progressing   Problem: Coping: Goal: Level of anxiety will decrease Outcome: Progressing   Problem: Elimination: Goal: Will not experience complications related to bowel motility Outcome: Progressing Goal: Will not experience complications related to urinary retention Outcome: Progressing   Problem: Pain Managment: Goal: General experience of comfort will improve Outcome: Not Progressing   Problem: Safety: Goal: Ability to remain free from injury will improve Outcome: Progressing   Problem: Skin Integrity: Goal: Risk for impaired skin integrity will decrease Outcome: Progressing

## 2023-07-26 NOTE — Progress Notes (Signed)
Palliative: Chart review completed.  Matthew Rivera is expected to go home with the benefits of hospice care today.  Face-to-face discussion with bedside nursing staff related to symptom management needs.    Matthew Rivera is seen in the intensive care.  He appears acutely ill and frail.  He will make an somewhat keep eye contact, but is confused.  His aunt Okey Regal is present at bedside.  Symptom management discussed with bedside nursing staff.  Plan: Home with the benefits of hospice care for comfort care. DNR/goldenrod form on chart.  35 minutes  Lillia Carmel, NP Palliative Medicine Team  Team phone [phone]

## 2023-07-26 NOTE — Progress Notes (Signed)
SLP Cancellation Note  Patient Details Name: JIE STICKELS MRN: [id number] DOB: Jan 05, 1961   Cancelled treatment:       Reason Eval/Treat Not Completed: Other (comment) (D/C Summary written and Pt to d/c home with hospice. SLP will sign off.)  Thank you,  Havery Moros, CCC-SLP [phone]  Rejoice Heatwole 07/26/2023, 6:04 PM

## 2023-07-26 NOTE — Progress Notes (Signed)
After reviewing the pt's in-patient notes, I called Kim to confirm if the patient will be going to hospice. [name] confirms that the pt will be going home today on hospice. C.Heilingoetter, Onc PA and scheduler notified to cancel lab and f/u appt for 10/16. Navigation complete

## 2023-07-27 ENCOUNTER — Inpatient Hospital Stay: Payer: 59 | Admitting: Dietician

## 2023-07-27 ENCOUNTER — Inpatient Hospital Stay: Payer: 59

## 2023-07-27 ENCOUNTER — Inpatient Hospital Stay: Payer: 59 | Admitting: Physician Assistant

## 2023-07-27 MED ORDER — HEPARIN SOD (PORK) LOCK FLUSH 100 UNIT/ML IV SOLN
500.0000 [IU] | Freq: Once | INTRAVENOUS | Status: AC
  Administered 2023-07-27: 500 [IU] via INTRAVENOUS
  Filled 2023-07-27: qty 5

## 2023-07-27 NOTE — Care Management Important Message (Deleted)
Important Message  Patient Details  Name: Matthew Rivera MRN: [id number] Date of Birth: 02-27-1961   Important Message Given:  No (home with hospice)     Corey Harold 07/27/2023, 8:46 AM

## 2023-07-27 NOTE — Progress Notes (Signed)
Pain medication given for EMS transport. Discharge instructions reviewed with patient's daughter. Port de-accessed prior to transportation. No concerns from family

## 2023-07-27 NOTE — TOC Transition Note (Signed)
Transition of Care [facility]) - CM/SW Discharge Note   Patient Details  Name: Matthew Rivera MRN: [id number] Date of Birth: [birth year]  Transition of Care [facility]) CM/SW Contact:  Villa Herb, LCSWA Phone Number: 07/27/2023, 9:20 AM   Clinical Narrative:    CSW spoke with pts wife who states she is ready to accept pt today as all needed DME has been delivered. CSW updated RN of EMS being called. CSW placed pt on EMS list. CSW updated [facility] of plan for D/C home today. CSW updated RN of EMS being called. CSW to completed med necessity and send it to the floor for EMS. TOC signing off.   Final next level of care: Home w Hospice Care Barriers to Discharge: Barriers Resolved   Patient Goals and CMS Choice CMS Medicare.gov Compare Post Acute Care list provided to:: Patient Represenative (must comment) Choice offered to / list presented to : Patient, Spouse  Discharge Placement                    Name of family member notified: Wife Patient and family notified of of transfer: 07/27/23  Discharge Plan and Services Additional resources added to the After Visit Summary for                                       Social Determinants of Health (SDOH) Interventions SDOH Screenings   Food Insecurity: No Food Insecurity (07/17/2023)  Housing: Low Risk  (07/17/2023)  Transportation Needs: No Transportation Needs (07/17/2023)  Utilities: Not At Risk (07/17/2023)  Alcohol Screen: Low Risk  (05/03/2023)  Depression (PHQ2-9): Low Risk  (05/02/2023)  Tobacco Use: High Risk (07/18/2023)     Readmission Risk Interventions    07/18/2023   12:26 PM 07/12/2023    1:54 PM 06/03/2023    1:54 PM  Readmission Risk Prevention Plan  Transportation Screening Complete Complete Complete  PCP or Specialist Appt within 3-5 Days  Complete Complete  HRI or Home Care Consult  Complete Complete  Social Work Consult for Recovery Care Planning/Counseling  Complete Complete  Palliative  Care Screening  Not Applicable Not Complete  Palliative Care Screening Not Complete Comments   MD has orderes palliative for GOC  Medication Review [employer]) Complete Complete Complete  HRI or Home Care Consult Complete    SW Recovery Care/Counseling Consult Complete    Palliative Care Screening Not Applicable    Skilled Nursing Facility Not Applicable

## 2023-08-02 ENCOUNTER — Other Ambulatory Visit: Payer: 59

## 2023-08-09 ENCOUNTER — Other Ambulatory Visit: Payer: 59

## 2023-08-12 DEATH — deceased

## 2023-08-16 ENCOUNTER — Ambulatory Visit (HOSPITAL_COMMUNITY): Payer: 59

## 2023-08-17 ENCOUNTER — Ambulatory Visit: Payer: 59 | Admitting: Physician Assistant

## 2023-08-17 ENCOUNTER — Other Ambulatory Visit: Payer: 59

## 2023-08-17 ENCOUNTER — Encounter: Payer: Self-pay | Admitting: Internal Medicine

## 2023-08-17 ENCOUNTER — Ambulatory Visit: Payer: 59

## 2023-08-24 ENCOUNTER — Encounter: Payer: Self-pay | Admitting: Student

## 2023-08-24 ENCOUNTER — Telehealth: Payer: Self-pay

## 2023-08-24 ENCOUNTER — Inpatient Hospital Stay: Payer: 59 | Admitting: Nurse Practitioner

## 2023-08-24 NOTE — Telephone Encounter (Signed)
I tried calling the patient per Rhunette Croft, NP. Patient was suppose to be seen for a hospital follow up and never showed. I was unable to leave a voicemail. Per Rhunette Croft, NP, the patient needs to be rescheduled with Dr Isaiah Serge urgently on 12-2.  AB, CMA 08-24-2023

## 2023-08-31 ENCOUNTER — Encounter: Payer: Self-pay | Admitting: Radiation Therapy

## 2023-09-06 ENCOUNTER — Ambulatory Visit: Payer: 59

## 2023-09-06 ENCOUNTER — Other Ambulatory Visit: Payer: 59

## 2023-09-06 ENCOUNTER — Ambulatory Visit: Payer: 59 | Admitting: Physician Assistant
